# Patient Record
Sex: Female | Born: 1950
Health system: Southern US, Community
[De-identification: ages and names within clinical notes are randomized; demographics above are authoritative.]

## PROBLEM LIST (undated history)

## (undated) DIAGNOSIS — I10 Essential (primary) hypertension: Secondary | ICD-10-CM

## (undated) DIAGNOSIS — D649 Anemia, unspecified: Secondary | ICD-10-CM

## (undated) DIAGNOSIS — T8859XA Other complications of anesthesia, initial encounter: Secondary | ICD-10-CM

## (undated) DIAGNOSIS — E041 Nontoxic single thyroid nodule: Secondary | ICD-10-CM

## (undated) DIAGNOSIS — K219 Gastro-esophageal reflux disease without esophagitis: Secondary | ICD-10-CM

## (undated) DIAGNOSIS — R0981 Nasal congestion: Secondary | ICD-10-CM

## (undated) DIAGNOSIS — E785 Hyperlipidemia, unspecified: Secondary | ICD-10-CM

## (undated) DIAGNOSIS — R519 Headache, unspecified: Secondary | ICD-10-CM

## (undated) DIAGNOSIS — I209 Angina pectoris, unspecified: Secondary | ICD-10-CM

## (undated) DIAGNOSIS — T4145XA Adverse effect of unspecified anesthetic, initial encounter: Secondary | ICD-10-CM

## (undated) DIAGNOSIS — R51 Headache: Secondary | ICD-10-CM

## (undated) DIAGNOSIS — Z8719 Personal history of other diseases of the digestive system: Secondary | ICD-10-CM

## (undated) DIAGNOSIS — M1990 Unspecified osteoarthritis, unspecified site: Secondary | ICD-10-CM

## (undated) HISTORY — PX: FRACTURE SURGERY: SHX138

## (undated) HISTORY — PX: BACK SURGERY: SHX140

## (undated) HISTORY — PX: HERNIA REPAIR: SHX51

## (undated) HISTORY — PX: TONSILLECTOMY: SUR1361

---

## 1982-05-02 HISTORY — PX: PATELLA FRACTURE SURGERY: SHX735

## 1986-05-02 HISTORY — PX: TUBAL LIGATION: SHX77

## 1998-02-04 ENCOUNTER — Encounter: Payer: Self-pay | Admitting: Emergency Medicine

## 1998-02-04 ENCOUNTER — Emergency Department (HOSPITAL_COMMUNITY): Admission: EM | Admit: 1998-02-04 | Discharge: 1998-02-04 | Payer: Self-pay | Admitting: Emergency Medicine

## 1998-05-05 ENCOUNTER — Ambulatory Visit (HOSPITAL_COMMUNITY): Admission: RE | Admit: 1998-05-05 | Discharge: 1998-05-05 | Payer: Self-pay | Admitting: *Deleted

## 1998-05-05 ENCOUNTER — Encounter: Payer: Self-pay | Admitting: *Deleted

## 1998-06-11 ENCOUNTER — Encounter: Payer: Self-pay | Admitting: *Deleted

## 1998-06-11 ENCOUNTER — Ambulatory Visit (HOSPITAL_COMMUNITY): Admission: RE | Admit: 1998-06-11 | Discharge: 1998-06-11 | Payer: Self-pay | Admitting: *Deleted

## 1998-07-03 ENCOUNTER — Other Ambulatory Visit: Admission: RE | Admit: 1998-07-03 | Discharge: 1998-07-03 | Payer: Self-pay | Admitting: Obstetrics and Gynecology

## 1999-07-12 ENCOUNTER — Other Ambulatory Visit: Admission: RE | Admit: 1999-07-12 | Discharge: 1999-07-12 | Payer: Self-pay | Admitting: Obstetrics and Gynecology

## 2000-07-17 ENCOUNTER — Other Ambulatory Visit: Admission: RE | Admit: 2000-07-17 | Discharge: 2000-07-17 | Payer: Self-pay | Admitting: Obstetrics and Gynecology

## 2000-08-08 ENCOUNTER — Encounter (INDEPENDENT_AMBULATORY_CARE_PROVIDER_SITE_OTHER): Payer: Self-pay | Admitting: Specialist

## 2000-08-08 ENCOUNTER — Other Ambulatory Visit: Admission: RE | Admit: 2000-08-08 | Discharge: 2000-08-08 | Payer: Self-pay | Admitting: Obstetrics and Gynecology

## 2001-03-15 ENCOUNTER — Encounter: Payer: Self-pay | Admitting: Emergency Medicine

## 2001-03-15 ENCOUNTER — Emergency Department (HOSPITAL_COMMUNITY): Admission: EM | Admit: 2001-03-15 | Discharge: 2001-03-15 | Payer: Self-pay | Admitting: Emergency Medicine

## 2001-04-30 ENCOUNTER — Ambulatory Visit (HOSPITAL_COMMUNITY): Admission: RE | Admit: 2001-04-30 | Discharge: 2001-04-30 | Payer: Self-pay | Admitting: Otolaryngology

## 2001-04-30 ENCOUNTER — Encounter: Payer: Self-pay | Admitting: Otolaryngology

## 2001-05-14 ENCOUNTER — Encounter: Payer: Self-pay | Admitting: *Deleted

## 2001-05-14 ENCOUNTER — Ambulatory Visit (HOSPITAL_COMMUNITY): Admission: RE | Admit: 2001-05-14 | Discharge: 2001-05-14 | Payer: Self-pay | Admitting: *Deleted

## 2001-08-06 ENCOUNTER — Other Ambulatory Visit: Admission: RE | Admit: 2001-08-06 | Discharge: 2001-08-06 | Payer: Self-pay | Admitting: Obstetrics and Gynecology

## 2005-03-17 ENCOUNTER — Ambulatory Visit (HOSPITAL_COMMUNITY): Admission: RE | Admit: 2005-03-17 | Discharge: 2005-03-17 | Payer: Self-pay | Admitting: Obstetrics and Gynecology

## 2005-07-12 ENCOUNTER — Encounter (HOSPITAL_COMMUNITY): Admission: RE | Admit: 2005-07-12 | Discharge: 2005-08-11 | Payer: Self-pay | Admitting: Family Medicine

## 2005-08-12 ENCOUNTER — Encounter (HOSPITAL_COMMUNITY): Admission: RE | Admit: 2005-08-12 | Discharge: 2005-09-11 | Payer: Self-pay | Admitting: Family Medicine

## 2005-09-12 ENCOUNTER — Encounter (HOSPITAL_COMMUNITY): Admission: RE | Admit: 2005-09-12 | Discharge: 2005-10-12 | Payer: Self-pay | Admitting: Family Medicine

## 2007-01-27 ENCOUNTER — Emergency Department (HOSPITAL_COMMUNITY): Admission: EM | Admit: 2007-01-27 | Discharge: 2007-01-27 | Payer: Self-pay | Admitting: Emergency Medicine

## 2007-01-27 ENCOUNTER — Ambulatory Visit: Payer: Self-pay | Admitting: *Deleted

## 2008-04-08 ENCOUNTER — Encounter (HOSPITAL_COMMUNITY): Admission: RE | Admit: 2008-04-08 | Discharge: 2008-04-29 | Payer: Self-pay | Admitting: Endocrinology

## 2008-05-02 DIAGNOSIS — E041 Nontoxic single thyroid nodule: Secondary | ICD-10-CM

## 2008-05-02 HISTORY — DX: Nontoxic single thyroid nodule: E04.1

## 2008-09-18 ENCOUNTER — Encounter: Admission: RE | Admit: 2008-09-18 | Discharge: 2008-09-18 | Payer: Self-pay | Admitting: Allergy and Immunology

## 2008-12-26 ENCOUNTER — Emergency Department (HOSPITAL_COMMUNITY): Admission: EM | Admit: 2008-12-26 | Discharge: 2008-12-27 | Payer: Self-pay | Admitting: Emergency Medicine

## 2010-02-26 ENCOUNTER — Ambulatory Visit: Payer: Self-pay | Admitting: Vascular Surgery

## 2010-02-26 ENCOUNTER — Ambulatory Visit (HOSPITAL_COMMUNITY): Admission: RE | Admit: 2010-02-26 | Discharge: 2010-02-26 | Payer: Self-pay | Admitting: Orthopedic Surgery

## 2010-02-26 ENCOUNTER — Encounter (INDEPENDENT_AMBULATORY_CARE_PROVIDER_SITE_OTHER): Payer: Self-pay | Admitting: Orthopedic Surgery

## 2010-05-02 HISTORY — PX: POSTERIOR LUMBAR FUSION: SHX6036

## 2010-05-07 LAB — BASIC METABOLIC PANEL
BUN: 11 mg/dL (ref 6–23)
CO2: 33 mEq/L — ABNORMAL HIGH (ref 19–32)
Calcium: 9.7 mg/dL (ref 8.4–10.5)
Chloride: 101 mEq/L (ref 96–112)
Creatinine, Ser: 0.74 mg/dL (ref 0.4–1.2)
GFR calc Af Amer: >60 mL/min (ref >60)
GFR calc non Af Amer: >60 mL/min (ref >60)
Glucose, Bld: 109 mg/dL — ABNORMAL HIGH (ref 70–99)
Potassium: 3.5 mEq/L (ref 3.5–5.1)
Sodium: 140 mEq/L (ref 135–145)

## 2010-05-07 LAB — CBC
HCT: 39.8 % (ref 36.0–46.0)
Hemoglobin: 13.3 g/dL (ref 12.0–15.0)
MCH: 29 pg (ref 26.0–34.0)
MCHC: 33.4 g/dL (ref 30.0–36.0)
MCV: 86.9 fL (ref 78.0–100.0)
Platelets: 307 10*3/uL (ref 150–400)
RBC: 4.58 MIL/uL (ref 3.87–5.11)
RDW: 13.8 % (ref 11.5–15.5)
WBC: 6.8 10*3/uL (ref 4.0–10.5)

## 2010-05-07 LAB — TYPE AND SCREEN
ABO/RH(D): A POS
Antibody Screen: NEGATIVE

## 2010-05-07 LAB — ABO/RH: ABO/RH(D): A POS

## 2010-05-13 ENCOUNTER — Inpatient Hospital Stay (HOSPITAL_COMMUNITY)
Admission: RE | Admit: 2010-05-13 | Discharge: 2010-05-17 | Payer: Self-pay | Source: Home / Self Care | Attending: Neurosurgery | Admitting: Neurosurgery

## 2010-05-17 LAB — SURGICAL PCR SCREEN
MRSA, PCR: NEGATIVE
Staphylococcus aureus: NEGATIVE

## 2010-06-22 NOTE — Discharge Summary (Signed)
  NAMECARISHA, KANTOR NO.:  [id number]  MEDICAL RECORD NO.:  [id number]          PATIENT TYPE:  INP  LOCATION:  3017                         FACILITY:  [facility]  PHYSICIAN:  Danae Orleans. Venetia Maxon, M.D.  DATE OF BIRTH:  09-03-1950  DATE OF ADMISSION:  05/13/2010 DATE OF DISCHARGE:  05/17/2010                              DISCHARGE SUMMARY   REASON FOR ADMISSION:  L5-S1 spondylolisthesis right lower extremity with radiculopathy with morbid obesity.  HISTORY OF PRESENT ILLNESS:  Ms. Satcher is a 60 year old woman with L5- S1 spondylosis in the severe foraminal stenosis who would like to undergo surgery and she was admitted on same day of procedure basis for posterior decompression and fusion with minimal invasive surgery.  She did well and surgery was gradually mobilized, was doing well on June 17, 2010, and was discharged home in stable and satisfactory condition. She tolerated the hospitalization.  Surgery in hospitalization without difficulty.  She was sent home with hydrocodone, valsartan/hydrochlorothiazide 80/12.5 mg daily, ranitidine 300 mg daily, Nexium 40 mg twice daily. Instructed to follow up with me in my office in 3 weeks postoperatively.     Danae Orleans. Venetia Maxon, M.D.     JDS/MEDQ  D:  06/21/2010  T:  06/21/2010  Job:  [id number]  Electronically Signed by Maeola Harman M.D. on 06/22/2010 04:51:55 PM

## 2010-08-07 LAB — POCT I-STAT, CHEM 8
BUN: 24 mg/dL — ABNORMAL HIGH (ref 6–23)
Calcium, Ion: 1.12 mmol/L (ref 1.12–1.32)
Chloride: 103 mEq/L (ref 96–112)
Creatinine, Ser: 1.1 mg/dL (ref 0.4–1.2)
Glucose, Bld: 156 mg/dL — ABNORMAL HIGH (ref 70–99)
HCT: 40 % (ref 36.0–46.0)
Hemoglobin: 13.6 g/dL (ref 12.0–15.0)
Potassium: 3.2 mEq/L — ABNORMAL LOW (ref 3.5–5.1)
Sodium: 142 mEq/L (ref 135–145)
TCO2: 29 mmol/L (ref 0–100)

## 2014-06-20 DIAGNOSIS — M25561 Pain in right knee: Secondary | ICD-10-CM | POA: Diagnosis not present

## 2014-06-20 DIAGNOSIS — I10 Essential (primary) hypertension: Secondary | ICD-10-CM | POA: Diagnosis not present

## 2014-06-20 DIAGNOSIS — J309 Allergic rhinitis, unspecified: Secondary | ICD-10-CM | POA: Diagnosis not present

## 2014-06-20 DIAGNOSIS — Z79899 Other long term (current) drug therapy: Secondary | ICD-10-CM | POA: Diagnosis not present

## 2014-07-08 DIAGNOSIS — H4011X3 Primary open-angle glaucoma, severe stage: Secondary | ICD-10-CM | POA: Diagnosis not present

## 2014-07-18 DIAGNOSIS — I10 Essential (primary) hypertension: Secondary | ICD-10-CM | POA: Diagnosis not present

## 2014-07-18 DIAGNOSIS — K469 Unspecified abdominal hernia without obstruction or gangrene: Secondary | ICD-10-CM | POA: Diagnosis not present

## 2014-07-18 DIAGNOSIS — H6121 Impacted cerumen, right ear: Secondary | ICD-10-CM | POA: Diagnosis not present

## 2014-07-18 DIAGNOSIS — E559 Vitamin D deficiency, unspecified: Secondary | ICD-10-CM | POA: Diagnosis not present

## 2014-07-18 DIAGNOSIS — Z79899 Other long term (current) drug therapy: Secondary | ICD-10-CM | POA: Diagnosis not present

## 2014-07-29 DIAGNOSIS — K458 Other specified abdominal hernia without obstruction or gangrene: Secondary | ICD-10-CM | POA: Diagnosis not present

## 2014-07-29 DIAGNOSIS — K439 Ventral hernia without obstruction or gangrene: Secondary | ICD-10-CM | POA: Diagnosis not present

## 2014-09-05 DIAGNOSIS — E559 Vitamin D deficiency, unspecified: Secondary | ICD-10-CM | POA: Diagnosis not present

## 2014-09-05 DIAGNOSIS — J069 Acute upper respiratory infection, unspecified: Secondary | ICD-10-CM | POA: Diagnosis not present

## 2014-09-05 DIAGNOSIS — K469 Unspecified abdominal hernia without obstruction or gangrene: Secondary | ICD-10-CM | POA: Diagnosis not present

## 2014-09-05 DIAGNOSIS — J029 Acute pharyngitis, unspecified: Secondary | ICD-10-CM | POA: Diagnosis not present

## 2014-09-05 DIAGNOSIS — I10 Essential (primary) hypertension: Secondary | ICD-10-CM | POA: Diagnosis not present

## 2014-10-03 DIAGNOSIS — K439 Ventral hernia without obstruction or gangrene: Secondary | ICD-10-CM | POA: Diagnosis not present

## 2014-10-28 DIAGNOSIS — H4011X3 Primary open-angle glaucoma, severe stage: Secondary | ICD-10-CM | POA: Diagnosis not present

## 2014-11-11 DIAGNOSIS — J309 Allergic rhinitis, unspecified: Secondary | ICD-10-CM | POA: Diagnosis not present

## 2014-11-11 DIAGNOSIS — H6980 Other specified disorders of Eustachian tube, unspecified ear: Secondary | ICD-10-CM | POA: Diagnosis not present

## 2014-12-05 DIAGNOSIS — Z1289 Encounter for screening for malignant neoplasm of other sites: Secondary | ICD-10-CM | POA: Diagnosis not present

## 2014-12-05 DIAGNOSIS — Z1231 Encounter for screening mammogram for malignant neoplasm of breast: Secondary | ICD-10-CM | POA: Diagnosis not present

## 2014-12-30 DIAGNOSIS — J019 Acute sinusitis, unspecified: Secondary | ICD-10-CM | POA: Diagnosis not present

## 2014-12-31 DIAGNOSIS — D849 Immunodeficiency, unspecified: Secondary | ICD-10-CM | POA: Diagnosis not present

## 2015-01-29 DIAGNOSIS — H4011X1 Primary open-angle glaucoma, mild stage: Secondary | ICD-10-CM | POA: Diagnosis not present

## 2015-04-10 DIAGNOSIS — H6121 Impacted cerumen, right ear: Secondary | ICD-10-CM | POA: Diagnosis not present

## 2015-04-10 DIAGNOSIS — I10 Essential (primary) hypertension: Secondary | ICD-10-CM | POA: Diagnosis not present

## 2015-04-10 DIAGNOSIS — Z79899 Other long term (current) drug therapy: Secondary | ICD-10-CM | POA: Diagnosis not present

## 2015-04-10 DIAGNOSIS — E559 Vitamin D deficiency, unspecified: Secondary | ICD-10-CM | POA: Diagnosis not present

## 2015-04-10 DIAGNOSIS — E784 Other hyperlipidemia: Secondary | ICD-10-CM | POA: Diagnosis not present

## 2015-04-20 DIAGNOSIS — H401131 Primary open-angle glaucoma, bilateral, mild stage: Secondary | ICD-10-CM | POA: Diagnosis not present

## 2015-04-20 DIAGNOSIS — H2513 Age-related nuclear cataract, bilateral: Secondary | ICD-10-CM | POA: Diagnosis not present

## 2015-06-12 ENCOUNTER — Ambulatory Visit (INDEPENDENT_AMBULATORY_CARE_PROVIDER_SITE_OTHER): Payer: Medicare Other | Admitting: Allergy and Immunology

## 2015-06-12 VITALS — BP 128/72 | HR 84 | Temp 98.8°F | Resp 20

## 2015-06-12 DIAGNOSIS — R05 Cough: Secondary | ICD-10-CM | POA: Diagnosis not present

## 2015-06-12 DIAGNOSIS — Z8709 Personal history of other diseases of the respiratory system: Secondary | ICD-10-CM | POA: Insufficient documentation

## 2015-06-12 DIAGNOSIS — B9689 Other specified bacterial agents as the cause of diseases classified elsewhere: Secondary | ICD-10-CM

## 2015-06-12 DIAGNOSIS — J3089 Other allergic rhinitis: Secondary | ICD-10-CM | POA: Diagnosis not present

## 2015-06-12 DIAGNOSIS — R059 Cough, unspecified: Secondary | ICD-10-CM | POA: Insufficient documentation

## 2015-06-12 DIAGNOSIS — J019 Acute sinusitis, unspecified: Secondary | ICD-10-CM | POA: Insufficient documentation

## 2015-06-12 DIAGNOSIS — J0110 Acute frontal sinusitis, unspecified: Secondary | ICD-10-CM | POA: Diagnosis not present

## 2015-06-12 HISTORY — DX: Acute sinusitis, unspecified: B96.89

## 2015-06-12 MED ORDER — FLUTICASONE PROPIONATE 50 MCG/ACT NA SUSP: NASAL | Status: DC

## 2015-06-12 MED ORDER — PREDNISONE 1 MG PO TABS: 10.0000 mg | ORAL_TABLET | ORAL | Status: DC

## 2015-06-12 MED ORDER — METHYLPREDNISOLONE ACETATE 80 MG/ML IJ SUSP
80.0000 mg | Freq: Once | INTRAMUSCULAR | Status: AC
  Administered 2015-06-12: 80 mg via INTRAMUSCULAR

## 2015-06-12 NOTE — Assessment & Plan Note (Signed)
   Continue albuterol HFA, 1-2 inhalations every 4-6 hours as needed. 

## 2015-06-12 NOTE — Assessment & Plan Note (Signed)
   Depo-Medrol 80 mg was administered in the office.  Prednisone has been provided and is to be started tomorrow as follows: 20 mg daily x 4 days, 10 mg x1 day, then stop.  A prescription has been provided for fluticasone nasal spray, 2 sprays per nostril daily as needed. Proper nasal spray technique has been discussed and demonstrated.  Nasal saline lavage (NeilMed) as needed has been recommended along with instructions for proper administration.  Guaifenesin [phone] mg twice daily as needed with adequate hydration as discussed.   The patient has been asked to contact me if her symptoms persist, progress, or if she becomes febrile. Otherwise, she may return for follow up in 6 months.

## 2015-06-12 NOTE — Assessment & Plan Note (Signed)
Most likely secondary to upper airway cough syndrome plus/minus bronchial hyper-responsiveness triggered by sinusitis.  Treatment plan as outlined above.

## 2015-06-12 NOTE — Patient Instructions (Signed)
Acute sinusitis  Depo-Medrol 80 mg was administered in the office.  Prednisone has been provided and is to be started tomorrow as follows: 20 mg daily x 4 days, 10 mg x1 day, then stop.  A prescription has been provided for fluticasone nasal spray, 2 sprays per nostril daily as needed. Proper nasal spray technique has been discussed and demonstrated.  Nasal saline lavage (NeilMed) as needed has been recommended along with instructions for proper administration.  Guaifenesin [phone] mg twice daily as needed with adequate hydration as discussed.   The patient has been asked to contact me if her symptoms persist, progress, or if she becomes febrile. Otherwise, she may return for follow up in 6 months.  Cough Most likely secondary to upper airway cough syndrome plus/minus bronchial hyper-responsiveness triggered by sinusitis.  Treatment plan as outlined above.  History of asthma  Continue albuterol HFA, 1-2 inhalations every 4-6 hours as needed.   Return in about 6 months (around 12/10/2015), or if symptoms worsen or fail to improve.

## 2015-06-12 NOTE — Progress Notes (Signed)
Follow-up Note  RE: KALANDRA MASTERS MRN: [id number] DOB: [birth year] Date of Office Visit: 06/12/2015  Primary care provider: Gwynneth Aliment, MD Referring provider: No ref. provider found  History of present illness: HPI Comments: Rhonda [name] is a 65 y.o. female with a history of allergic rhinosinusitis, laryngopharyngeal reflux, and distant history of asthma presents today a for sick visit.  She was last seen in this office 12/30/2014.  She complains of sinus pressure over her forehead and behind her eyes, bilateral ear pressure, thick postnasal drainage, sore throat, and coughing.  She has expressed the symptoms over the past 3 days and states that the symptoms are progressing.  She denies fevers, chills, or discolored mucus production.   Assessment and plan: Acute sinusitis  Depo-Medrol 80 mg was administered in the office.  Prednisone has been provided and is to be started tomorrow as follows: 20 mg daily x 4 days, 10 mg x1 day, then stop.  A prescription has been provided for fluticasone nasal spray, 2 sprays per nostril daily as needed. Proper nasal spray technique has been discussed and demonstrated.  Nasal saline lavage (NeilMed) as needed has been recommended along with instructions for proper administration.  Guaifenesin [phone] mg twice daily as needed with adequate hydration as discussed.   The patient has been asked to contact me if her symptoms persist, progress, or if she becomes febrile. Otherwise, she may return for follow up in 6 months.  Cough Most likely secondary to upper airway cough syndrome plus/minus bronchial hyper-responsiveness triggered by sinusitis.  Treatment plan as outlined above.  History of asthma  Continue albuterol HFA, 1-2 inhalations every 4-6 hours as needed.    Meds ordered this encounter  Medications  . methylPREDNISolone acetate (DEPO-MEDROL) injection 80 mg    Sig:   . fluticasone (FLONASE) 50 MCG/ACT nasal spray   Sig: Use 2 sprays in each nostril once daily for stuffy nose or drainage.    Dispense:  17 g    Refill:  5  . predniSONE (DELTASONE) tablet 10 mg    Sig:     Diagnositics: Spirometry:  Normal with an FEV1 of 94% predicted.  Please see scanned spirometry results for details.    Physical examination: Blood pressure 128/72, pulse 84, temperature 98.8 F (37.1 C), temperature source Oral, resp. rate 20.  General: Alert, interactive, in no acute distress. HEENT: TMs pearly gray, turbinates edematous with thick discharge, post-pharynx erythematous. Neck: Supple without lymphadenopathy. Lungs: Clear to auscultation without wheezing, rhonchi or rales. CV: Normal S1, S2 without murmurs. Skin: Warm and dry, without lesions or rashes.  The following portions of the patient's history were reviewed and updated as appropriate: allergies, current medications, past family history, past medical history, past social history, past surgical history and problem list.    Medication List       This list is accurate as of: 06/12/15 12:41 PM.  Always use your most recent med list.               dorzolamide-timolol 22.3-6.8 MG/ML ophthalmic solution  Commonly known as:  COSOPT     ezetimibe 10 MG tablet  Commonly known as:  ZETIA  Take 10 mg by mouth daily.     fluticasone 50 MCG/ACT nasal spray  Commonly known as:  FLONASE  Use 2 sprays in each nostril once daily for stuffy nose or drainage.     levocetirizine 5 MG tablet  Commonly known as:  XYZAL     PROAIR  HFA IN  Inhale 2 puffs into the lungs every 4 (four) hours as needed.     ranitidine 300 MG capsule  Commonly known as:  ZANTAC  Take 300 mg by mouth every evening.     valsartan-hydrochlorothiazide 320-25 MG tablet  Commonly known as:  DIOVAN-HCT        Allergies  Allergen Reactions  . Codeine Diarrhea    Stomach cramps    Review of systems: Constitutional: Negative for fever, chills and weight loss.  HENT: Negative  for nosebleeds.   Positive for nasal congestion, sinus pressure/pain, postnasal drainage. Eyes: Negative for blurred vision.  Respiratory: Negative for hemoptysis.   Positive for coughing. Cardiovascular: Negative for chest pain.  Gastrointestinal: Negative for diarrhea and constipation.  Genitourinary: Negative for dysuria.  Musculoskeletal: Negative for myalgias and joint pain.  Neurological: Negative for dizziness.  Endo/Heme/Allergies: Does not bruise/bleed easily.   No past medical history on file.  No family history on file.  Social History   Social History  . Marital Status: Married    Spouse Name: N/A  . Number of Children: N/A  . Years of Education: N/A   Occupational History  . Not on file.   Social History Main Topics  . Smoking status: Not on file  . Smokeless tobacco: Not on file  . Alcohol Use: Not on file  . Drug Use: Not on file  . Sexual Activity: Not on file   Other Topics Concern  . Not on file   Social History Narrative  . No narrative on file    I appreciate the opportunity to take part in this Wonder's care. Please do not hesitate to contact me with questions.  Sincerely,   R. Jorene Guest, MD

## 2015-08-03 DIAGNOSIS — H401131 Primary open-angle glaucoma, bilateral, mild stage: Secondary | ICD-10-CM | POA: Diagnosis not present

## 2015-10-09 DIAGNOSIS — I10 Essential (primary) hypertension: Secondary | ICD-10-CM | POA: Diagnosis not present

## 2015-10-09 DIAGNOSIS — Z0000 Encounter for general adult medical examination without abnormal findings: Secondary | ICD-10-CM | POA: Diagnosis not present

## 2015-10-09 DIAGNOSIS — E559 Vitamin D deficiency, unspecified: Secondary | ICD-10-CM | POA: Diagnosis not present

## 2015-10-09 DIAGNOSIS — R7309 Other abnormal glucose: Secondary | ICD-10-CM | POA: Diagnosis not present

## 2015-10-09 DIAGNOSIS — J069 Acute upper respiratory infection, unspecified: Secondary | ICD-10-CM | POA: Diagnosis not present

## 2015-10-09 DIAGNOSIS — E782 Mixed hyperlipidemia: Secondary | ICD-10-CM | POA: Diagnosis not present

## 2015-10-09 DIAGNOSIS — H6121 Impacted cerumen, right ear: Secondary | ICD-10-CM | POA: Diagnosis not present

## 2015-10-30 DIAGNOSIS — Z23 Encounter for immunization: Secondary | ICD-10-CM | POA: Diagnosis not present

## 2015-11-09 DIAGNOSIS — H401131 Primary open-angle glaucoma, bilateral, mild stage: Secondary | ICD-10-CM | POA: Diagnosis not present

## 2016-02-22 DIAGNOSIS — K429 Umbilical hernia without obstruction or gangrene: Secondary | ICD-10-CM | POA: Diagnosis not present

## 2016-02-22 DIAGNOSIS — K219 Gastro-esophageal reflux disease without esophagitis: Secondary | ICD-10-CM | POA: Diagnosis not present

## 2016-02-22 DIAGNOSIS — Z86010 Personal history of colonic polyps: Secondary | ICD-10-CM | POA: Diagnosis not present

## 2016-02-26 DIAGNOSIS — Z23 Encounter for immunization: Secondary | ICD-10-CM | POA: Diagnosis not present

## 2016-03-23 ENCOUNTER — Encounter (INDEPENDENT_AMBULATORY_CARE_PROVIDER_SITE_OTHER): Payer: Self-pay

## 2016-03-23 ENCOUNTER — Ambulatory Visit (INDEPENDENT_AMBULATORY_CARE_PROVIDER_SITE_OTHER): Payer: Medicare Other | Admitting: Allergy and Immunology

## 2016-03-23 VITALS — BP 140/70 | HR 76 | Resp 16

## 2016-03-23 DIAGNOSIS — K219 Gastro-esophageal reflux disease without esophagitis: Secondary | ICD-10-CM

## 2016-03-23 DIAGNOSIS — R42 Dizziness and giddiness: Secondary | ICD-10-CM

## 2016-03-23 DIAGNOSIS — J3089 Other allergic rhinitis: Secondary | ICD-10-CM | POA: Diagnosis not present

## 2016-03-23 DIAGNOSIS — H9313 Tinnitus, bilateral: Secondary | ICD-10-CM

## 2016-03-23 DIAGNOSIS — H6983 Other specified disorders of Eustachian tube, bilateral: Secondary | ICD-10-CM

## 2016-03-23 NOTE — Patient Instructions (Addendum)
  1. Prednisone 10 mg tablet one tablet one time per day for the next 5 days  2. Continue Flonase 2 sprays each nostril once a day  3. Can use ibuprofen if needed  4. Can use antihistamine if needed  5. Continue Nexium 40mg  and ranitidine 300 mg once a day  6. Evaluation with ENT for ETD  7. Obtain flu vaccine  8. Further evaluation treatment?  9. Return to clinic in 1 year or earlier if problem

## 2016-03-23 NOTE — Progress Notes (Signed)
Follow-up Note  Referring Provider: Dorothyann Peng, MD Primary Provider: Gwynneth Aliment, MD Date of Office Visit: 03/23/2016  Subjective:   Rhonda [name] (DOB: [birth year]) is a 65 y.o. female [facility] in re-evaluation of the following:  HPI: Rhonda [name] returns to this clinic in evaluation of her allergic rhinoconjunctivitis and LPR and a very distant history of asthma. I've not seen her in his clinic in over a year.  She was doing quite well regarding her respiratory tract and had no need to use a systemic steroid or an antibiotic since I last her in this clinic to treat her respiratory tract issue. Her asthma has basally melted away and she has not used a short acting bronchodilator in years. However, over the course of the past 5-7 days she's developed a bilateral ringing in her years and she has a little bit of a sore throat and she feels a little bit unsteady without any frank vertigo and her hearing is diminished. In fact, her hearing is somewhat down over the course of the past 2 months.    Medication List      dorzolamide-timolol 22.3-6.8 MG/ML ophthalmic solution Commonly known as:  COSOPT   esomeprazole 40 MG capsule Commonly known as:  NEXIUM Take 40 mg by mouth daily at 12 noon.   ezetimibe 10 MG tablet Commonly known as:  ZETIA Take 10 mg by mouth daily.   fluticasone 50 MCG/ACT nasal spray Commonly known as:  FLONASE Use 2 sprays in each nostril once daily for stuffy nose or drainage.   levocetirizine 5 MG tablet Commonly known as:  XYZAL   PROAIR HFA IN Inhale 2 puffs into the lungs every 4 (four) hours as needed.   ranitidine 300 MG capsule Commonly known as:  ZANTAC Take 300 mg by mouth every evening.   sodium chloride 0.65 % Soln nasal spray Commonly known as:  OCEAN Place 1 spray into both nostrils as needed for congestion.   valsartan-hydrochlorothiazide 320-25 MG tablet Commonly known as:   DIOVAN-HCT Take 1 tablet by mouth daily.   Vitamin D 2000 units Caps Take by mouth.       No past medical history on file.  No past surgical history on file.  Allergies  Allergen Reactions  . Codeine Diarrhea    Stomach cramps     Review of systems negative except as noted in HPI / PMHx or noted below:  Review of Systems  Constitutional: Negative.   HENT: Negative.   Eyes: Negative.   Respiratory: Negative.   Cardiovascular: Negative.   Gastrointestinal: Negative.   Genitourinary: Negative.   Musculoskeletal: Negative.   Skin: Negative.   Neurological: Negative.   Endo/Heme/Allergies: Negative.   Psychiatric/Behavioral: Negative.      Objective:   Vitals:   03/23/16 1013  BP: 140/70  Pulse: 76  Resp: 16          Physical Exam  Constitutional: She is well-developed, well-nourished, and in no distress.  HENT:  Head: Normocephalic.  Right Ear: Tympanic membrane, external ear and ear canal normal. No foreign bodies (wax).  Left Ear: External ear and ear canal normal. A middle ear effusion (dull light reflex with fluid) is present.  Nose: Nose normal. No mucosal edema or rhinorrhea.  Mouth/Throat: Uvula is midline, oropharynx is clear and moist and mucous membranes are normal. No oropharyngeal exudate.  Eyes: Conjunctivae are normal.  Neck: Trachea normal. No tracheal tenderness present. No tracheal  deviation present. No thyromegaly present.  Cardiovascular: Normal rate, regular rhythm, S1 normal, S2 normal and normal heart sounds.   No murmur heard. Pulmonary/Chest: Breath sounds normal. No stridor. No respiratory distress. She has no wheezes. She has no rales.  Musculoskeletal: She exhibits no edema.  Lymphadenopathy:       Head (right side): No tonsillar adenopathy present.       Head (left side): No tonsillar adenopathy present.    She has no cervical adenopathy.  Neurological: She is alert. Gait normal.  Skin: No rash noted. She is not diaphoretic.  No erythema. Nails show no clubbing.  Psychiatric: Mood and affect normal.    Diagnostics: None   Assessment and Plan:   1. ETD (Eustachian tube dysfunction), bilateral   2. Tinnitus of both ears   3. Vertigo   4. Other allergic rhinitis   5. LPRD (laryngopharyngeal reflux disease)     1. Prednisone 10 mg tablet one tablet one time per day for the next 5 days  2. Continue Flonase 2 sprays each nostril once a day  3. Can use ibuprofen if needed  4. Can use antihistamine if needed  5. Continue Nexium 40mg  and ranitidine 300 mg once a day  6. Evaluation with ENT for ETD  7. Obtain flu vaccine  8. Further evaluation treatment?  9. Return to clinic in 1 year or earlier if problem  Although Rhonda [name] appears to have pretty good control of her atopic respiratory disease there is something wrong with her eustachian tube and she's having vertigo and tinnitus and some hearing loss. Certainly this could be Mnire's disease and I would like for her to be evaluated by an ENT physician for evaluation of this issue but this could also be secondary to a acute respiratory tract infection most likely a viral etiology. I've given her some anti-inflammatory agents as noted above to help with this issue. Certainly if she progresses she'll require further evaluation and treatment. She'll also continue to utilize therapy directed against her reflux which is under excellent control. I'll see her back in this clinic in 1 year or earlier if there is a problem.   Rhonda Schimke, MD [facility]

## 2016-03-31 DIAGNOSIS — Z86010 Personal history of colonic polyps: Secondary | ICD-10-CM | POA: Diagnosis not present

## 2016-03-31 DIAGNOSIS — K5730 Diverticulosis of large intestine without perforation or abscess without bleeding: Secondary | ICD-10-CM | POA: Diagnosis not present

## 2016-03-31 HISTORY — PX: COLONOSCOPY: SHX174

## 2016-04-04 ENCOUNTER — Telehealth: Payer: Self-pay | Admitting: Allergy and Immunology

## 2016-04-04 NOTE — Telephone Encounter (Signed)
Pt was calling  About a appointment to the ENT, has not heard anything from us.[phone].

## 2016-04-04 NOTE — Telephone Encounter (Signed)
L/M for patient to call office.  Appt made with Dr Jearld FentonByers on Dec 14 at 1:00 [facility] 12:[facility] [address]

## 2016-04-04 NOTE — Telephone Encounter (Signed)
Patient advised of appt

## 2016-04-05 ENCOUNTER — Emergency Department (HOSPITAL_COMMUNITY)
Admission: EM | Admit: 2016-04-05 | Discharge: 2016-04-05 | Disposition: A | Discharge status: Home / Self Care | Payer: Medicare Other | Attending: Emergency Medicine | Admitting: Emergency Medicine

## 2016-04-05 ENCOUNTER — Encounter (HOSPITAL_COMMUNITY): Payer: Self-pay | Admitting: Emergency Medicine

## 2016-04-05 DIAGNOSIS — H5789 Other specified disorders of eye and adnexa: Secondary | ICD-10-CM

## 2016-04-05 DIAGNOSIS — H05221 Edema of right orbit: Secondary | ICD-10-CM | POA: Diagnosis not present

## 2016-04-05 DIAGNOSIS — Y939 Activity, unspecified: Secondary | ICD-10-CM | POA: Insufficient documentation

## 2016-04-05 DIAGNOSIS — S50862A Insect bite (nonvenomous) of left forearm, initial encounter: Secondary | ICD-10-CM | POA: Diagnosis not present

## 2016-04-05 DIAGNOSIS — S00261A Insect bite (nonvenomous) of right eyelid and periocular area, initial encounter: Secondary | ICD-10-CM | POA: Diagnosis not present

## 2016-04-05 DIAGNOSIS — W57XXXA Bitten or stung by nonvenomous insect and other nonvenomous arthropods, initial encounter: Secondary | ICD-10-CM | POA: Insufficient documentation

## 2016-04-05 DIAGNOSIS — I10 Essential (primary) hypertension: Secondary | ICD-10-CM | POA: Diagnosis not present

## 2016-04-05 DIAGNOSIS — Y929 Unspecified place or not applicable: Secondary | ICD-10-CM | POA: Diagnosis not present

## 2016-04-05 DIAGNOSIS — Y999 Unspecified external cause status: Secondary | ICD-10-CM | POA: Diagnosis not present

## 2016-04-05 DIAGNOSIS — S50861A Insect bite (nonvenomous) of right forearm, initial encounter: Secondary | ICD-10-CM | POA: Diagnosis not present

## 2016-04-05 HISTORY — DX: Essential (primary) hypertension: I10

## 2016-04-05 HISTORY — DX: Gastro-esophageal reflux disease without esophagitis: K21.9

## 2016-04-05 HISTORY — DX: Hyperlipidemia, unspecified: E78.5

## 2016-04-05 MED ORDER — CEPHALEXIN 500 MG PO CAPS: 500.0000 mg | ORAL_CAPSULE | Freq: Four times a day (QID) | ORAL | 0 refills | Status: DC

## 2016-04-05 MED ORDER — DIPHENHYDRAMINE HCL 25 MG PO CAPS
25.0000 mg | ORAL_CAPSULE | Freq: Once | ORAL | Status: AC
  Administered 2016-04-05: 25 mg via ORAL
  Filled 2016-04-05: qty 1

## 2016-04-05 NOTE — ED Triage Notes (Signed)
Name called to lobby. Patient in restroom at this time.

## 2016-04-05 NOTE — ED Provider Notes (Signed)
[facility]-EMERGENCY DEPT Provider Note   CSN: [id number] Arrival date & time: 04/05/16  [id number]  By signing my name below, I, Rhonda [name], attest that this documentation has been prepared under the direction and in the presence of Rhonda FowlerKayla Pat Sires, PA-C. Electronically Signed: Javier Dockerobert Ryan [name], ER Scribe. 12/12/2015. 10:21 AM.   History   Chief Complaint Chief Complaint  Patient presents with  . Facial Swelling    right   HPI  HPI Comments: Rhonda [name] is a 65 y.o. female who presents to the Emergency Department complaining of left eyelid swelling with associated eyelid itching that began last night and worsened over the course of the evening. She endorses associated right eye drainage this morning. She denies new medications, soaps, lotions, detergents. She put anti itch cream on her eyelid last night due to itching. She is also complaining of scattered insect bites to her forearms as well. She denies fever, HA, eye pain, eye pain with movement, visual disturbances, numbness, or weakness. She took two benadryl last night.  Past Medical History:  Diagnosis Date  . GERD (gastroesophageal reflux disease)   . Hyperlipemia   . Hypertension     Patient Active Problem List   Diagnosis Date Noted  . Acute sinusitis 06/12/2015  . Cough 06/12/2015  . History of asthma 06/12/2015    Past Surgical History:  Procedure Laterality Date  . BACK SURGERY  2012  . CESAREAN SECTION     x2  . TONSILLECTOMY      OB History    No data available       Home Medications    Prior to Admission medications   Medication Sig Start Date End Date Taking? Authorizing Provider  Albuterol Sulfate (PROAIR HFA IN) Inhale 2 puffs into the lungs every 4 (four) hours as needed.    Historical Provider, MD  Cholecalciferol (VITAMIN D) 2000 units CAPS Take by mouth.    Historical Provider, MD  dorzolamide-timolol (COSOPT) 22.3-6.8 MG/ML ophthalmic solution  04/23/15   Historical Provider, MD  esomeprazole  (NEXIUM) 40 MG capsule Take 40 mg by mouth daily at 12 noon.    Historical Provider, MD  ezetimibe (ZETIA) 10 MG tablet Take 10 mg by mouth daily.    Historical Provider, MD  fluticasone (FLONASE) 50 MCG/ACT nasal spray Use 2 sprays in each nostril once daily for stuffy nose or drainage. Patient not taking: Reported on 03/23/2016 06/12/15   Cristal Fordalph Carter Bobbitt, MD  levocetirizine Elita Boone(XYZAL) 5 MG tablet  05/15/15   Historical Provider, MD  ranitidine (ZANTAC) 300 MG capsule Take 300 mg by mouth every evening.    Historical Provider, MD  sodium chloride (OCEAN) 0.65 % SOLN nasal spray Place 1 spray into both nostrils as needed for congestion.    Historical Provider, MD  valsartan-hydrochlorothiazide (DIOVAN-HCT) 320-25 MG tablet Take 1 tablet by mouth daily.  05/19/15   Historical Provider, MD    Family History No family history on file.  Social History Social History  Substance Use Topics  . Smoking status: Never Smoker  . Smokeless tobacco: Never Used  . Alcohol use No     Allergies   Codeine   Review of Systems Review of Systems  Constitutional: Negative for chills and fever.  Eyes: Positive for photophobia and itching. Negative for pain, discharge, redness and visual disturbance.  Neurological: Negative for dizziness, speech difficulty, weakness, light-headedness, numbness and headaches.  All other systems reviewed and are negative.    Physical Exam Updated Vital Signs BP 154/57 (  BP Location: Left Arm)   Pulse 72   Temp 97.9 F (36.6 C) (Oral)   Resp 18   Ht 5\' 3"  (1.6 m)   Wt 122.5 kg   SpO2 100%   BMI 47.83 kg/m   Physical Exam  Constitutional: She is oriented to person, place, and time. She appears well-developed and well-nourished. No distress.  HENT:  Head: Normocephalic and atraumatic.  Right upper eyelid swelling with minimal warmth and erythema.  No induration. Likely excoriations to superior eyebrow.   Eyes: Conjunctivae and EOM are normal. Pupils are equal,  round, and reactive to light. Right eye exhibits no discharge. Left eye exhibits no discharge.  Normal EOMs without pain.   Neck: Neck supple.  Pulmonary/Chest: Effort normal. No respiratory distress.  Musculoskeletal: Normal range of motion.  Neurological: She is alert and oriented to person, place, and time. Coordination normal.  Skin: Skin is warm and dry. She is not diaphoretic.  Scattered insect bites over bilateral forearms without signs of infection.   Psychiatric: She has a normal mood and affect. Her behavior is normal.  Nursing note and vitals reviewed.    ED Treatments / Results  Labs (all labs ordered are listed, but only abnormal results are displayed) Labs Reviewed - No data to display  EKG  EKG Interpretation None       Radiology No results found.  Procedures Procedures (including critical care time)  Medications Ordered in ED Medications  diphenhydrAMINE (BENADRYL) capsule 25 mg (25 mg Oral Given 04/05/16 1040)     Initial Impression / Assessment and Plan / ED Course  I have reviewed the triage vital signs and the nursing notes.  Pertinent labs & imaging results that were available during my care of the patient were reviewed by me and considered in my medical decision making (see chart for details).  Clinical Course    Patient presents with 1 day of right supraorbital swelling. She has multiple insect bites over her bilateral forearms and suspects she was bitten on the face last night. She has no headache, eye pain, eye pain with movement, fever, chills, visual disturbances or neurologic symptoms. Therefore I have low suspicion for CVST or orbital/preseptal cellulitis. There is a small area superior to her right eyebrow that appears to be excoriated, but could be signs of early cellulitis. Therefore she'll be discharged home with Keflex. Recommend continued use of Benadryl for pruritus. Strict return precautions including headache, increased swelling, fever,  eye pain, or pain with eye movement. Close follow-up with PCP. Return precautions discussed. Stable for discharge.  Case has been discussed with and seen by Dr. Eudelia Bunchardama who agrees with the above plan for discharge.   Final Clinical Impressions(s) / ED Diagnoses   Final diagnoses:  Periorbital swelling  Insect bite, initial encounter    New Prescriptions New Prescriptions   No medications on file    I personally performed the services described in this documentation, which was scribed in my presence. The recorded information has been reviewed and is accurate.        Rhonda FowlerKayla Breunna Nordmann, PA-C 04/05/16 1108    Nira ConnPedro Eduardo Cardama, MD 04/07/16 1155

## 2016-04-05 NOTE — Discharge Instructions (Signed)
Start taking Keflex 4 times daily to treat for possible infection around her right eye. Continue taking Benadryl every 6 hours as needed for itching. Follow-up with your primary care physician in one to 2 days. Return to the emergency department for fever, chills, eye pain, pain with eye movement, increased swelling, headache, or any new or concerning symptoms.

## 2016-04-05 NOTE — ED Triage Notes (Signed)
Patient reports right eye swelling starting last night. Denies pain; reports itching.

## 2016-04-15 DIAGNOSIS — J329 Chronic sinusitis, unspecified: Secondary | ICD-10-CM | POA: Diagnosis not present

## 2016-04-15 DIAGNOSIS — H903 Sensorineural hearing loss, bilateral: Secondary | ICD-10-CM | POA: Diagnosis not present

## 2016-04-15 DIAGNOSIS — H9313 Tinnitus, bilateral: Secondary | ICD-10-CM | POA: Diagnosis not present

## 2016-04-15 DIAGNOSIS — R42 Dizziness and giddiness: Secondary | ICD-10-CM | POA: Diagnosis not present

## 2016-04-15 DIAGNOSIS — J320 Chronic maxillary sinusitis: Secondary | ICD-10-CM | POA: Diagnosis not present

## 2016-04-17 DIAGNOSIS — H9313 Tinnitus, bilateral: Secondary | ICD-10-CM | POA: Insufficient documentation

## 2016-04-20 DIAGNOSIS — R21 Rash and other nonspecific skin eruption: Secondary | ICD-10-CM | POA: Diagnosis not present

## 2016-04-20 DIAGNOSIS — H6121 Impacted cerumen, right ear: Secondary | ICD-10-CM | POA: Diagnosis not present

## 2016-04-20 DIAGNOSIS — L509 Urticaria, unspecified: Secondary | ICD-10-CM | POA: Diagnosis not present

## 2016-05-02 DIAGNOSIS — R011 Cardiac murmur, unspecified: Secondary | ICD-10-CM

## 2016-05-02 DIAGNOSIS — J45909 Unspecified asthma, uncomplicated: Secondary | ICD-10-CM

## 2016-05-02 HISTORY — DX: Unspecified asthma, uncomplicated: J45.909

## 2016-05-02 HISTORY — DX: Cardiac murmur, unspecified: R01.1

## 2016-05-09 ENCOUNTER — Other Ambulatory Visit: Payer: Self-pay | Admitting: General Surgery

## 2016-05-09 ENCOUNTER — Ambulatory Visit: Payer: Self-pay | Admitting: General Surgery

## 2016-05-09 DIAGNOSIS — K439 Ventral hernia without obstruction or gangrene: Secondary | ICD-10-CM | POA: Diagnosis not present

## 2016-05-09 DIAGNOSIS — H401131 Primary open-angle glaucoma, bilateral, mild stage: Secondary | ICD-10-CM | POA: Diagnosis not present

## 2016-05-09 DIAGNOSIS — K429 Umbilical hernia without obstruction or gangrene: Secondary | ICD-10-CM

## 2016-05-09 NOTE — H&P (Signed)
History of Present Illness Rhonda [name](Shafiq Larch MD; 05/09/2016 10:41 AM) Patient words: follow up.  The patient is a 66 year old female who presents with an abdominal wall hernia. patient is a 66 year old female who is a long-term follow-up for evaluation of a ventral hernia. Patient states that she doesn't hernias gotten larger. Patient recently had a colonoscopy was unable to have a colonoscopy completed secondary to her hernia. Patient has lost approximately 10 pounds since her last visit approximately 6 months ago.  Patient's had no signs or symptoms of incarceration or strangulation.   Allergies (Ammie Eversole, LPN; 2/1/30861/11/2016 57:8410:06 AM) Aspirin *ANALGESICS - NonNarcotic*  Codeine Phosphate *ANALGESICS - OPIOID*   Medication History (Ammie Eversole, LPN; 6/9/62951/11/2016 28:4110:07 AM) Ranitidine HCl (300MG  Tablet, Oral) Active. Levocetirizine Dihydrochloride (5MG  Tablet, Oral) Active. Vitamin D2 (2000UNIT Tablet, Oral) Active. Nexium 24HR (20MG  Capsule DR, Oral) Active. Dorzolamide HCl-Timolol Mal (22.3-6.8MG /ML Solution, Ophthalmic) Active. Valsartan-Hydrochlorothiazide (320-25MG  Tablet, Oral) Active. Ezetimibe (10MG  Tablet, Oral) Active. Medications Reconciled  Vitals (Ammie Eversole LPN; 3/2/44011/11/2016 02:7210:05 AM) 05/09/2016 10:05 AM Weight: 267 lb Height: 63.5in Body Surface Area: 2.2 m Body Mass Index: 46.55 kg/m  Temp.: 98.19F(Oral)  Pulse: 98 (Regular)  BP: 124/70 (Sitting, Left Arm, Standard)       Physical Exam Rhonda [name](Dorothye Berni MD; 05/09/2016 10:42 AM) General Mental Status-Alert. General Appearance-Consistent with stated age. Hydration-Well hydrated. Voice-Normal.  Head and Neck Head-normocephalic, atraumatic with no lesions or palpable masses. Trachea-midline. Thyroid Gland Characteristics - normal size and consistency.  Chest and Lung Exam Chest and lung exam reveals -quiet, even and easy respiratory effort with no use of accessory muscles  and on auscultation, normal breath sounds, no adventitious sounds and normal vocal resonance. Inspection Chest Wall - Normal. Back - normal.  Cardiovascular Cardiovascular examination reveals -normal heart sounds, regular rate and rhythm with no murmurs and normal pedal pulses bilaterally.  Abdomen Inspection Skin - Scar - no surgical scars. Hernias - Ventral - Reducible(Periumbilical ventral hernia. Rectus diastases.). Palpation/Percussion Normal exam - Soft, Non Tender, No Rebound tenderness, No Rigidity (guarding) and No hepatosplenomegaly. Auscultation Normal exam - Bowel sounds normal.    Assessment & Plan Rhonda [name](Socorro Ebron MD; 05/09/2016 10:43 AM) VENTRAL HERNIA WITHOUT OBSTRUCTION OR GANGRENE (K43.9) Impression: 66 year old female with a large ventral hernia. Patient has continue with weight loss. She's on the correct trend for weight loss. I do believe it is beneficial at this point to proceed with surgery.  1. The patient would like to proceed to the operating for a open ventral hernia repair with mesh,TAR approach. 2. All risks and benefits were discussed with the patient to generally include, but not limited to: infection, bleeding, damage to surrounding structures, acute and chronic nerve pain, and recurrence. Alternatives were offered and described. All questions were answered and the patient voiced understanding of the procedure and wishes to proceed at this point with hernia repair.

## 2016-05-13 ENCOUNTER — Ambulatory Visit
Admission: RE | Admit: 2016-05-13 | Discharge: 2016-05-13 | Disposition: A | Discharge status: Home / Self Care | Payer: Medicare Other | Source: Ambulatory Visit | Attending: General Surgery | Admitting: General Surgery

## 2016-05-13 DIAGNOSIS — K429 Umbilical hernia without obstruction or gangrene: Secondary | ICD-10-CM | POA: Diagnosis not present

## 2016-06-06 ENCOUNTER — Telehealth: Payer: Self-pay | Admitting: Allergy and Immunology

## 2016-06-06 NOTE — Telephone Encounter (Signed)
Scheduled appointment for tomorrow to see Dr. Lucie LeatherKozlow about allergic reaction.

## 2016-06-06 NOTE — Telephone Encounter (Signed)
Patient said she has had some type of allergic reaction. She has a swollen eye and bumps on the back of her neck. Last saw Dr. Lucie LeatherKozlow 03-23-16.

## 2016-06-07 ENCOUNTER — Ambulatory Visit (INDEPENDENT_AMBULATORY_CARE_PROVIDER_SITE_OTHER): Payer: Medicare Other | Admitting: Allergy and Immunology

## 2016-06-07 ENCOUNTER — Encounter (HOSPITAL_COMMUNITY)
Admission: RE | Admit: 2016-06-07 | Discharge: 2016-06-07 | Disposition: A | Discharge status: Home / Self Care | Payer: Medicare Other | Source: Ambulatory Visit | Attending: General Surgery | Admitting: General Surgery

## 2016-06-07 ENCOUNTER — Encounter (HOSPITAL_COMMUNITY): Payer: Self-pay

## 2016-06-07 ENCOUNTER — Encounter: Payer: Self-pay | Admitting: Allergy and Immunology

## 2016-06-07 VITALS — BP 148/72 | HR 84 | Resp 18

## 2016-06-07 DIAGNOSIS — K439 Ventral hernia without obstruction or gangrene: Secondary | ICD-10-CM | POA: Insufficient documentation

## 2016-06-07 DIAGNOSIS — J3089 Other allergic rhinitis: Secondary | ICD-10-CM

## 2016-06-07 DIAGNOSIS — K219 Gastro-esophageal reflux disease without esophagitis: Secondary | ICD-10-CM

## 2016-06-07 DIAGNOSIS — L501 Idiopathic urticaria: Secondary | ICD-10-CM

## 2016-06-07 DIAGNOSIS — Z01812 Encounter for preprocedural laboratory examination: Secondary | ICD-10-CM | POA: Insufficient documentation

## 2016-06-07 DIAGNOSIS — Z01810 Encounter for preprocedural cardiovascular examination: Secondary | ICD-10-CM | POA: Diagnosis not present

## 2016-06-07 HISTORY — DX: Adverse effect of unspecified anesthetic, initial encounter: T41.45XA

## 2016-06-07 HISTORY — DX: Nasal congestion: R09.81

## 2016-06-07 HISTORY — DX: Unspecified osteoarthritis, unspecified site: M19.90

## 2016-06-07 HISTORY — DX: Other complications of anesthesia, initial encounter: T88.59XA

## 2016-06-07 LAB — BASIC METABOLIC PANEL
Anion gap: 7 (ref 5–15)
BUN: 21 mg/dL — AB (ref 6–20)
CHLORIDE: 103 mmol/L (ref 101–111)
CO2: 31 mmol/L (ref 22–32)
CREATININE: 0.69 mg/dL (ref 0.44–1.00)
Calcium: 9.8 mg/dL (ref 8.9–10.3)
GFR calc Af Amer: >60 mL/min (ref >60)
GFR calc non Af Amer: >60 mL/min (ref >60)
GLUCOSE: 115 mg/dL — AB (ref 65–99)
Potassium: 3.6 mmol/L (ref 3.5–5.1)
SODIUM: 141 mmol/L (ref 135–145)

## 2016-06-07 LAB — CBC
HCT: 38.3 % (ref 36.0–46.0)
Hemoglobin: 12.4 g/dL (ref 12.0–15.0)
MCH: 28.1 pg (ref 26.0–34.0)
MCHC: 32.4 g/dL (ref 30.0–36.0)
MCV: 86.7 fL (ref 78.0–100.0)
PLATELETS: 298 10*3/uL (ref 150–400)
RBC: 4.42 MIL/uL (ref 3.87–5.11)
RDW: 14.8 % (ref 11.5–15.5)
WBC: 6.6 10*3/uL (ref 4.0–10.5)

## 2016-06-07 MED ORDER — LEVOCETIRIZINE DIHYDROCHLORIDE 5 MG PO TABS: ORAL_TABLET | ORAL | 5 refills | Status: DC

## 2016-06-07 MED ORDER — MONTELUKAST SODIUM 10 MG PO TABS: 10.0000 mg | ORAL_TABLET | Freq: Every day | ORAL | 5 refills | Status: DC

## 2016-06-07 NOTE — Progress Notes (Signed)
Follow-up Note  Referring Provider: Dorothyann Peng, MD Primary Provider: Gwynneth Aliment, MD Date of Office Visit: 06/07/2016  Subjective:   Rhonda [name] (DOB: [birth year]) is a 66 y.o. female [facility] in re-evaluation of the following:  HPI: Rhonda [name] presents to this clinic in evaluation of recurrent urticarial reactions with a predilection for eye swelling. I last saw Rhonda [name] in this clinic on 23 March 2016 at which point in time she had allergic rhinoconjunctivitis and LPR and a very distant history of asthma and what appeared to be some ear issues.  Rhonda [name] apparently has been developing recurrent urticarial reactions over the course of the past 2 months with unknown etiologic factor. She'll develop these super large red areas that are intensely itchy involving her hands and arms and posterior neck and predilection for involving her eyes. Her eyes will swell shut. It sounds as though she has required prednisone administered in the emergency room setting on April 05 2016 and then administered by her primary care doctor on May 11 2016. She does respond to therapy and within several days most of her reaction appears to resolve. She does take Benadryl. There are no associated systemic or constitutional symptoms. There is no obvious provoking factor giving rise to this issue.  Her airway issue and her reflux-induced respiratory disease are under excellent control currently using her medical therapy which includes some occasional nasal steroid and consistent use of Nexium and ranitidine. Her evaluation with an ENT doctor for her ETD did not identify any significant abnormality and there is no plan at this point in time by her ENT doctor to address this issue any further.  Allergies as of 06/07/2016      Reactions   Aspirin-acetaminophen-caffeine Nausea And Vomiting   Codeine Diarrhea   Stomach cramps      Medication List        acetaminophen 650 MG CR tablet Commonly known as:  TYLENOL Take 1,300 mg by mouth every 8 (eight) hours as needed for pain.   albuterol 108 (90 Base) MCG/ACT inhaler Commonly known as:  PROVENTIL HFA;VENTOLIN HFA Inhale 2 puffs into the lungs every 6 (six) hours as needed for wheezing or shortness of breath.   dorzolamide-timolol 22.3-6.8 MG/ML ophthalmic solution Commonly known as:  COSOPT Place 1 drop into both eyes 2 (two) times daily.   ezetimibe 10 MG tablet Commonly known as:  ZETIA Take 10 mg by mouth every evening.   fluticasone 50 MCG/ACT nasal spray Commonly known as:  FLONASE Use 2 sprays in each nostril once daily for stuffy nose or drainage.   levocetirizine 5 MG tablet Commonly known as:  XYZAL Take 5 mg by mouth every evening.   multivitamin with minerals Tabs tablet Take 1 tablet by mouth daily.   ranitidine 300 MG capsule Commonly known as:  ZANTAC Take 300 mg by mouth every evening.   valsartan-hydrochlorothiazide 320-25 MG tablet Commonly known as:  DIOVAN-HCT Take 1 tablet by mouth daily.   VITAMIN D3 PO Take 500 Units by mouth daily.       Past Medical History:  Diagnosis Date  . Arthritis   . Complication of anesthesia    woke up during colonoscopy 03-31-16 Dr. Laure Kidney  . GERD (gastroesophageal reflux disease)   . Hyperlipemia   . Hypertension   . Sinus congestion     Past Surgical History:  Procedure Laterality Date  . BACK SURGERY  2012  . CESAREAN  SECTION     x2  . COLONOSCOPY    . TONSILLECTOMY      Review of systems negative except as noted in HPI / PMHx or noted below:  Review of Systems  Constitutional: Negative.   HENT: Negative.   Eyes: Negative.   Respiratory: Negative.   Cardiovascular: Negative.   Gastrointestinal: Negative.   Genitourinary: Negative.   Musculoskeletal: Negative.   Skin: Negative.   Neurological: Negative.   Endo/Heme/Allergies: Negative.   Psychiatric/Behavioral: Negative.       Objective:   Vitals:   06/07/16 1344  BP: (!) 148/72  Pulse: 84  Resp: 18          Physical Exam  Constitutional: She is well-developed, well-nourished, and in no distress.  HENT:  Head: Normocephalic.  Right Ear: Tympanic membrane, external ear and ear canal normal.  Left Ear: Tympanic membrane, external ear and ear canal normal.  Nose: Nose normal. No mucosal edema or rhinorrhea.  Mouth/Throat: Uvula is midline, oropharynx is clear and moist and mucous membranes are normal. No oropharyngeal exudate.  Eyes: Conjunctivae are normal.  Neck: Trachea normal. No tracheal tenderness present. No tracheal deviation present. No thyromegaly present.  Cardiovascular: Normal rate, regular rhythm, S1 normal and S2 normal.   Murmur (systolic murmur) heard. Pulmonary/Chest: Breath sounds normal. No stridor. No respiratory distress. She has no wheezes. She has no rales.  Musculoskeletal: She exhibits no edema.  Lymphadenopathy:       Head (right side): No tonsillar adenopathy present.       Head (left side): No tonsillar adenopathy present.    She has no cervical adenopathy.  Neurological: She is alert. Gait normal.  Skin: Rash (large area of erythema and induration affecting left eyelid both top and bottom and surrounding periorbital region and urticarial lesion affecting posterior neck.) noted. She is not diaphoretic. No erythema. Nails show no clubbing.  Psychiatric: Mood and affect normal.    Diagnostics: Review of blood tests dated 06/07/2016 as preop evaluation identifies normal renal function, white blood cell count 6.6 without a differential, hemoglobin 12.4, platelet 298.  Assessment and Plan:   1. Idiopathic urticaria   2. Other allergic rhinitis   3. LPRD (laryngopharyngeal reflux disease)     1. Use the following:   A. Increase Xyzal 5mg  to twice a day  B. Start Montelukast 10mg  one time per day   C. Add benadryl if needed  2. Continue Flonase 2 sprays each  nostril once a day  3. Continue Nexium 40mg  and ranitidine 300 mg once a day  4. Review results of all blood tests  5. Futher evaluation treatment? Call clinic next week with update  Rhonda [name] has some form of immunological hyperreactivity of unknown etiologic factor. I will review all of her previous blood test that had been performed in the past 2 months and we'll make a decision about how to proceed with further evaluation pending her response to medical therapy noted above. She'll contact me noting her response to this approach. I would like to not administer any additional systemic steroids at this point in time if possible as she's had two systemic steroids over the course the past 2 months already.  Laurette Schimke, MD Allergy / Immunology [facility]

## 2016-06-07 NOTE — Progress Notes (Signed)
PCP: Dr. Melina Schoolsobyn sanders Sinus/allergy: Dr. Laurette SchimkeEric Kozlow  No cardiologist: stated had stress test 25+ yrs. Ago for chest pain and found out it was due to coughing- related to her allergies.  Pt. Stated she has has problems with allergies. She has her left eye swollen it started 06-05-16 and is much better today, there is 2 small raised areas on the lid.stated it happened back in Nov. 2017 and also in the past had an area on her arm that swelled up. She is seeing Dr. Sander RadonEric Kozol today..     States in the past he told her it was from a food allergy,not a bite from an insect.

## 2016-06-07 NOTE — Patient Instructions (Addendum)
  1. Use the following:   A. Increase Xyzal 5mg  to twice a day  B. Start Montelukast 10mg  one time per day   C. Add benadryl if needed  2. Continue Flonase 2 sprays each nostril once a day  3. Continue Nexium 40mg  and ranitidine 300 mg once a day  4. Review results of all blood tests  5. Futher evaluation treatment? Call clinic next week with update

## 2016-06-07 NOTE — Pre-Procedure Instructions (Addendum)
    Rhonda MiyamotoLuretta M [name]  06/07/2016      [facility] [place] [address] [place] [address] Phone: [phone] Fax: [phone]    Your procedure is scheduled on Wednesday, February 14.  [facility] Admitting at 6:30AM               Your surgery or procedure is scheduled for 8:30 AM   Call this number if you have problems the morning of surgery: [phone]    For any other questions, please call [phone], Monday - Friday 8 AM - 4 PM.   Remember:  Do not eat food or drink liquids after midnight Tuesday, February 13.  Take these medicines the morning of surgery with A SIP OF WATER:  May use eye drops. If needed use albuterol (PROVENTIL HFA;VENTOLIN HFA) and bring it to the hospital with you.    May take acetaminophen (TYLENOL) if needed.                1 Week prior to surgery STOP taking Aspirin, Aspirin Products (Goody Powder, Excedrin Migraine), Ibuprofen (Advil), Naproxen (Aleve), Vitamins and Herbal Products (ie Fish Oil)   Do not wear jewelry, make-up or nail polish.  Do not wear lotions, powders, or perfumes, or deodorant.  Do not shave 48 hours prior to surgery.    Do not bring valuables to the hospital.  [facility] is not responsible for any belongings or valuables.  Contacts, dentures or bridgework may not be worn into surgery.  Leave your suitcase in the car.  After surgery it may be brought to your room.  For patients admitted to the hospital, discharge time will be determined by your treatment team.  Patients discharged the day of surgery will not be allowed to drive home.   Name and phone number of your driver:   -  Special instructions:  Review  [place] - Preparing For Surgery.  Please read over the following fact sheets that you were given: Review  [place] - Preparing For Surgery, Coughing and Deep Breathing and Pain Booklet

## 2016-06-09 NOTE — Progress Notes (Signed)
Anesthesia Chart Review: Patient is a 66 year old female scheduled for open ventral hernia repair 06/15/16.   History includes non-smoker, HTN, HLD, GERD, arthritis, L5-S1 fusion '12, tonsillectomy. She was treated by allergist Dr. Lucie LeatherKozlow on 06/07/16 for idiopathic urticaria involving left periorbital region. He did not recommend repeat sytemic steroids at that time, since she had two prior courses within the past few months. BMI is consistent with obesity.   PCP is Dr. Dorothyann Pengobyn Sanders. She is not routinely followed by a cardiologist, but was seen by Dr. Sheliah MendsJens Eichhorn of [facility] in 2009/2010 and had a stress and echo (see below).  BP (!) 159/64   Pulse 76   Temp 36.5 C   Resp 20   Ht 5\' 3"  (1.6 m)   Wt 271 lb 12.8 oz (123.3 kg)   SpO2 96%   BMI 48.15 kg/m   Meds include albuterol, Cosopt ophthalmic, Zetia, Flonase, Xyzal, Singulair, Zantac, Diovan-HCT.  EKG 06/07/16: SR with first degree AV block, non-specific T wave abnormality. Since last tracing (05/07/10), no significant change.  Nuclear stress test 03/05/08 [facility]; scanned under Media tab, Correspondence 05/13/10): Impression:  Normal myocardial perfusion imaging. No significant ischemia demonstrated. This is a low risk scan. Post stress EF 76%. No significant wall motion abnormalities noted.  Echo 03/05/08 ([facility]; scanned under Media tab, Correspondence 05/13/10): Impression:  LVEF 65-75%. Borderline asymmetric LVH. No pericardial effusion.  Spirometry 06/12/15: FVC 2.08 (87%), FEV1 1.78 (94%), FEF25-75% 1.99 (89%).   Preoperative labs noted.   If no acute changes then I anticipate that she can proceed as planned.   Velna Ochsllison Zelenak, PA-C [facility]/Anesthesiology Phone [phone] 06/09/2016 2:33 PM

## 2016-06-14 MED ORDER — DEXTROSE 5 % IV SOLN
3.0000 g | INTRAVENOUS | Status: AC
  Administered 2016-06-15: 3 g via INTRAVENOUS
  Filled 2016-06-14: qty 3000

## 2016-06-15 ENCOUNTER — Inpatient Hospital Stay (HOSPITAL_COMMUNITY)
Admission: RE | Admit: 2016-06-15 | Discharge: 2016-06-28 | DRG: 353 | Disposition: A | Discharge status: Skilled Nursing Facility | Payer: Medicare Other | Source: Ambulatory Visit | Attending: General Surgery | Admitting: General Surgery

## 2016-06-15 ENCOUNTER — Inpatient Hospital Stay (HOSPITAL_COMMUNITY): Payer: Medicare Other | Admitting: Vascular Surgery

## 2016-06-15 ENCOUNTER — Encounter (HOSPITAL_COMMUNITY)
Admission: RE | Disposition: A | Discharge status: Skilled Nursing Facility | Payer: Self-pay | Source: Ambulatory Visit | Attending: General Surgery

## 2016-06-15 ENCOUNTER — Encounter (HOSPITAL_COMMUNITY): Payer: Self-pay

## 2016-06-15 ENCOUNTER — Inpatient Hospital Stay (HOSPITAL_COMMUNITY): Payer: Medicare Other | Admitting: Anesthesiology

## 2016-06-15 DIAGNOSIS — T81718A Complication of other artery following a procedure, not elsewhere classified, initial encounter: Secondary | ICD-10-CM | POA: Diagnosis not present

## 2016-06-15 DIAGNOSIS — I2690 Septic pulmonary embolism without acute cor pulmonale: Secondary | ICD-10-CM | POA: Diagnosis not present

## 2016-06-15 DIAGNOSIS — J019 Acute sinusitis, unspecified: Secondary | ICD-10-CM | POA: Diagnosis not present

## 2016-06-15 DIAGNOSIS — R0602 Shortness of breath: Secondary | ICD-10-CM

## 2016-06-15 DIAGNOSIS — E785 Hyperlipidemia, unspecified: Secondary | ICD-10-CM | POA: Diagnosis not present

## 2016-06-15 DIAGNOSIS — R531 Weakness: Secondary | ICD-10-CM | POA: Diagnosis not present

## 2016-06-15 DIAGNOSIS — K9189 Other postprocedural complications and disorders of digestive system: Secondary | ICD-10-CM | POA: Diagnosis not present

## 2016-06-15 DIAGNOSIS — K913 Postprocedural intestinal obstruction, unspecified as to partial versus complete: Secondary | ICD-10-CM | POA: Diagnosis not present

## 2016-06-15 DIAGNOSIS — E876 Hypokalemia: Secondary | ICD-10-CM | POA: Diagnosis not present

## 2016-06-15 DIAGNOSIS — R404 Transient alteration of awareness: Secondary | ICD-10-CM

## 2016-06-15 DIAGNOSIS — R0902 Hypoxemia: Secondary | ICD-10-CM | POA: Diagnosis not present

## 2016-06-15 DIAGNOSIS — Z79899 Other long term (current) drug therapy: Secondary | ICD-10-CM | POA: Diagnosis not present

## 2016-06-15 DIAGNOSIS — F05 Delirium due to known physiological condition: Secondary | ICD-10-CM | POA: Diagnosis not present

## 2016-06-15 DIAGNOSIS — I2699 Other pulmonary embolism without acute cor pulmonale: Secondary | ICD-10-CM

## 2016-06-15 DIAGNOSIS — N179 Acute kidney failure, unspecified: Secondary | ICD-10-CM | POA: Diagnosis not present

## 2016-06-15 DIAGNOSIS — R935 Abnormal findings on diagnostic imaging of other abdominal regions, including retroperitoneum: Secondary | ICD-10-CM | POA: Diagnosis not present

## 2016-06-15 DIAGNOSIS — K567 Ileus, unspecified: Secondary | ICD-10-CM | POA: Diagnosis not present

## 2016-06-15 DIAGNOSIS — Z6841 Body Mass Index (BMI) 40.0 and over, adult: Secondary | ICD-10-CM

## 2016-06-15 DIAGNOSIS — R938 Abnormal findings on diagnostic imaging of other specified body structures: Secondary | ICD-10-CM | POA: Diagnosis not present

## 2016-06-15 DIAGNOSIS — R05 Cough: Secondary | ICD-10-CM | POA: Diagnosis not present

## 2016-06-15 DIAGNOSIS — Y838 Other surgical procedures as the cause of abnormal reaction of the patient, or of later complication, without mention of misadventure at the time of the procedure: Secondary | ICD-10-CM | POA: Diagnosis not present

## 2016-06-15 DIAGNOSIS — Z981 Arthrodesis status: Secondary | ICD-10-CM | POA: Diagnosis not present

## 2016-06-15 DIAGNOSIS — E1165 Type 2 diabetes mellitus with hyperglycemia: Secondary | ICD-10-CM | POA: Diagnosis not present

## 2016-06-15 DIAGNOSIS — M6281 Muscle weakness (generalized): Secondary | ICD-10-CM | POA: Diagnosis not present

## 2016-06-15 DIAGNOSIS — E870 Hyperosmolality and hypernatremia: Secondary | ICD-10-CM | POA: Diagnosis not present

## 2016-06-15 DIAGNOSIS — K439 Ventral hernia without obstruction or gangrene: Secondary | ICD-10-CM | POA: Diagnosis not present

## 2016-06-15 DIAGNOSIS — K56609 Unspecified intestinal obstruction, unspecified as to partial versus complete obstruction: Secondary | ICD-10-CM | POA: Diagnosis not present

## 2016-06-15 DIAGNOSIS — Z9889 Other specified postprocedural states: Secondary | ICD-10-CM

## 2016-06-15 DIAGNOSIS — R110 Nausea: Secondary | ICD-10-CM | POA: Diagnosis not present

## 2016-06-15 DIAGNOSIS — I10 Essential (primary) hypertension: Secondary | ICD-10-CM | POA: Diagnosis not present

## 2016-06-15 DIAGNOSIS — R2681 Unsteadiness on feet: Secondary | ICD-10-CM | POA: Diagnosis not present

## 2016-06-15 DIAGNOSIS — R52 Pain, unspecified: Secondary | ICD-10-CM

## 2016-06-15 DIAGNOSIS — Z4682 Encounter for fitting and adjustment of non-vascular catheter: Secondary | ICD-10-CM | POA: Diagnosis not present

## 2016-06-15 DIAGNOSIS — Z4659 Encounter for fitting and adjustment of other gastrointestinal appliance and device: Secondary | ICD-10-CM

## 2016-06-15 DIAGNOSIS — K219 Gastro-esophageal reflux disease without esophagitis: Secondary | ICD-10-CM | POA: Diagnosis not present

## 2016-06-15 DIAGNOSIS — Z48815 Encounter for surgical aftercare following surgery on the digestive system: Secondary | ICD-10-CM | POA: Diagnosis not present

## 2016-06-15 DIAGNOSIS — Z8719 Personal history of other diseases of the digestive system: Secondary | ICD-10-CM

## 2016-06-15 HISTORY — PX: INSERTION OF MESH: SHX5868

## 2016-06-15 HISTORY — PX: VENTRAL HERNIA REPAIR: SHX424

## 2016-06-15 HISTORY — DX: Headache: R51

## 2016-06-15 HISTORY — DX: Headache, unspecified: R51.9

## 2016-06-15 HISTORY — PX: ABDOMINAL HERNIA REPAIR: SHX539

## 2016-06-15 HISTORY — DX: Nontoxic single thyroid nodule: E04.1

## 2016-06-15 HISTORY — DX: Anemia, unspecified: D64.9

## 2016-06-15 SURGERY — REPAIR, HERNIA, VENTRAL
Anesthesia: General | Site: Abdomen

## 2016-06-15 MED ORDER — FENTANYL CITRATE (PF) 100 MCG/2ML IJ SOLN
INTRAMUSCULAR | Status: AC
  Filled 2016-06-15: qty 2

## 2016-06-15 MED ORDER — MEPERIDINE HCL 25 MG/ML IJ SOLN: 6.2500 mg | INTRAMUSCULAR | Status: DC | PRN

## 2016-06-15 MED ORDER — BOOST / RESOURCE BREEZE PO LIQD
1.0000 | Freq: Three times a day (TID) | ORAL | Status: DC
  Administered 2016-06-15 – 2016-06-19 (×10): 1 via ORAL

## 2016-06-15 MED ORDER — METHOCARBAMOL 500 MG PO TABS
500.0000 mg | ORAL_TABLET | Freq: Four times a day (QID) | ORAL | Status: DC | PRN
  Administered 2016-06-16 – 2016-06-17 (×3): 500 mg via ORAL
  Filled 2016-06-15 (×4): qty 1

## 2016-06-15 MED ORDER — VALSARTAN-HYDROCHLOROTHIAZIDE 320-25 MG PO TABS: 1.0000 | ORAL_TABLET | Freq: Every day | ORAL | Status: DC

## 2016-06-15 MED ORDER — ONDANSETRON HCL 4 MG/2ML IJ SOLN
INTRAMUSCULAR | Status: AC
  Filled 2016-06-15: qty 2

## 2016-06-15 MED ORDER — HYDROCHLOROTHIAZIDE 25 MG PO TABS
25.0000 mg | ORAL_TABLET | Freq: Every day | ORAL | Status: DC
  Administered 2016-06-16 – 2016-06-19 (×4): 25 mg via ORAL
  Filled 2016-06-15 (×6): qty 1

## 2016-06-15 MED ORDER — ADULT MULTIVITAMIN W/MINERALS CH
1.0000 | ORAL_TABLET | Freq: Every day | ORAL | Status: DC
  Administered 2016-06-16 – 2016-06-26 (×7): 1 via ORAL
  Filled 2016-06-15 (×12): qty 1

## 2016-06-15 MED ORDER — ZOLPIDEM TARTRATE 5 MG PO TABS
5.0000 mg | ORAL_TABLET | Freq: Every evening | ORAL | Status: DC | PRN
  Administered 2016-06-20: 5 mg via ORAL
  Filled 2016-06-15: qty 1

## 2016-06-15 MED ORDER — 0.9 % SODIUM CHLORIDE (POUR BTL) OPTIME
TOPICAL | Status: DC | PRN
  Administered 2016-06-15: 2000 mL

## 2016-06-15 MED ORDER — DORZOLAMIDE HCL-TIMOLOL MAL 2-0.5 % OP SOLN
1.0000 [drp] | Freq: Two times a day (BID) | OPHTHALMIC | Status: DC
  Administered 2016-06-15 – 2016-06-28 (×26): 1 [drp] via OPHTHALMIC
  Filled 2016-06-15 (×2): qty 10

## 2016-06-15 MED ORDER — HYDROMORPHONE HCL 2 MG/ML IJ SOLN
1.0000 mg | INTRAMUSCULAR | Status: DC | PRN
  Administered 2016-06-15: 1 mg via INTRAVENOUS
  Administered 2016-06-15 (×2): 2 mg via INTRAVENOUS
  Administered 2016-06-16: 1 mg via INTRAVENOUS
  Administered 2016-06-16 (×3): 2 mg via INTRAVENOUS
  Filled 2016-06-15 (×7): qty 1

## 2016-06-15 MED ORDER — FENTANYL CITRATE (PF) 100 MCG/2ML IJ SOLN
INTRAMUSCULAR | Status: AC
  Administered 2016-06-15: 50 ug via INTRAVENOUS
  Filled 2016-06-15: qty 2

## 2016-06-15 MED ORDER — FENTANYL CITRATE (PF) 100 MCG/2ML IJ SOLN
25.0000 ug | INTRAMUSCULAR | Status: DC | PRN
  Administered 2016-06-15 (×3): 50 ug via INTRAVENOUS

## 2016-06-15 MED ORDER — HYDROMORPHONE HCL 1 MG/ML IJ SOLN
INTRAMUSCULAR | Status: DC | PRN
  Administered 2016-06-15: 1 mg via INTRAVENOUS

## 2016-06-15 MED ORDER — CHLORHEXIDINE GLUCONATE CLOTH 2 % EX PADS: 6.0000 | MEDICATED_PAD | Freq: Once | CUTANEOUS | Status: DC

## 2016-06-15 MED ORDER — ROCURONIUM BROMIDE 50 MG/5ML IV SOSY
PREFILLED_SYRINGE | INTRAVENOUS | Status: AC
  Filled 2016-06-15: qty 5

## 2016-06-15 MED ORDER — FENTANYL CITRATE (PF) 100 MCG/2ML IJ SOLN
INTRAMUSCULAR | Status: DC | PRN
  Administered 2016-06-15: 100 ug via INTRAVENOUS
  Administered 2016-06-15 (×4): 50 ug via INTRAVENOUS

## 2016-06-15 MED ORDER — EVICEL 5 ML EX KIT
PACK | CUTANEOUS | Status: DC | PRN
  Administered 2016-06-15: 5 mL

## 2016-06-15 MED ORDER — SUGAMMADEX SODIUM 500 MG/5ML IV SOLN
INTRAVENOUS | Status: AC
  Filled 2016-06-15: qty 5

## 2016-06-15 MED ORDER — EVICEL 5 ML EX KIT
PACK | CUTANEOUS | Status: AC
Start: 2016-06-15 — End: 2016-06-15
  Filled 2016-06-15: qty 2

## 2016-06-15 MED ORDER — MIDAZOLAM HCL 2 MG/2ML IJ SOLN
INTRAMUSCULAR | Status: AC
  Filled 2016-06-15: qty 2

## 2016-06-15 MED ORDER — MONTELUKAST SODIUM 10 MG PO TABS
10.0000 mg | ORAL_TABLET | Freq: Every day | ORAL | Status: DC
  Administered 2016-06-15 – 2016-06-19 (×5): 10 mg via ORAL
  Filled 2016-06-15 (×6): qty 1

## 2016-06-15 MED ORDER — ONDANSETRON 4 MG PO TBDP: 4.0000 mg | ORAL_TABLET | Freq: Four times a day (QID) | ORAL | Status: DC | PRN

## 2016-06-15 MED ORDER — PROMETHAZINE HCL 25 MG/ML IJ SOLN
INTRAMUSCULAR | Status: AC
  Administered 2016-06-15: 6.25 mg via INTRAVENOUS
  Filled 2016-06-15: qty 1

## 2016-06-15 MED ORDER — HYDROMORPHONE HCL 1 MG/ML IJ SOLN
INTRAMUSCULAR | Status: AC
  Filled 2016-06-15: qty 1

## 2016-06-15 MED ORDER — IRBESARTAN 300 MG PO TABS
300.0000 mg | ORAL_TABLET | Freq: Every day | ORAL | Status: DC
  Administered 2016-06-16 – 2016-06-19 (×4): 300 mg via ORAL
  Filled 2016-06-15 (×6): qty 1

## 2016-06-15 MED ORDER — MIDAZOLAM HCL 5 MG/5ML IJ SOLN
INTRAMUSCULAR | Status: DC | PRN
  Administered 2016-06-15: 2 mg via INTRAVENOUS

## 2016-06-15 MED ORDER — ALBUTEROL SULFATE (2.5 MG/3ML) 0.083% IN NEBU
2.5000 mg | INHALATION_SOLUTION | Freq: Four times a day (QID) | RESPIRATORY_TRACT | Status: DC | PRN
  Administered 2016-06-16 – 2016-06-25 (×11): 2.5 mg via RESPIRATORY_TRACT
  Filled 2016-06-15 (×11): qty 3

## 2016-06-15 MED ORDER — LACTATED RINGERS IV SOLN
INTRAVENOUS | Status: DC | PRN
  Administered 2016-06-15 (×3): via INTRAVENOUS

## 2016-06-15 MED ORDER — OXYCODONE HCL 5 MG PO TABS
5.0000 mg | ORAL_TABLET | ORAL | Status: DC | PRN
  Administered 2016-06-16 – 2016-06-17 (×2): 10 mg via ORAL
  Filled 2016-06-15 (×3): qty 2

## 2016-06-15 MED ORDER — ROCURONIUM BROMIDE 100 MG/10ML IV SOLN
INTRAVENOUS | Status: DC | PRN
  Administered 2016-06-15: 50 mg via INTRAVENOUS
  Administered 2016-06-15: 30 mg via INTRAVENOUS

## 2016-06-15 MED ORDER — PHENYLEPHRINE HCL 10 MG/ML IJ SOLN
INTRAMUSCULAR | Status: DC | PRN
  Administered 2016-06-15: 120 ug via INTRAVENOUS

## 2016-06-15 MED ORDER — PROMETHAZINE HCL 25 MG/ML IJ SOLN
6.2500 mg | INTRAMUSCULAR | Status: DC | PRN
  Administered 2016-06-15: 6.25 mg via INTRAVENOUS

## 2016-06-15 MED ORDER — PROPOFOL 10 MG/ML IV BOLUS
INTRAVENOUS | Status: AC
  Filled 2016-06-15: qty 20

## 2016-06-15 MED ORDER — ONDANSETRON HCL 4 MG/2ML IJ SOLN
4.0000 mg | Freq: Four times a day (QID) | INTRAMUSCULAR | Status: DC | PRN
  Administered 2016-06-15 – 2016-06-16 (×2): 4 mg via INTRAVENOUS
  Filled 2016-06-15 (×2): qty 2

## 2016-06-15 MED ORDER — PROPOFOL 10 MG/ML IV BOLUS
INTRAVENOUS | Status: DC | PRN
  Administered 2016-06-15: 200 mg via INTRAVENOUS

## 2016-06-15 MED ORDER — ENOXAPARIN SODIUM 40 MG/0.4ML ~~LOC~~ SOLN
40.0000 mg | SUBCUTANEOUS | Status: DC
  Administered 2016-06-16 – 2016-06-19 (×4): 40 mg via SUBCUTANEOUS
  Filled 2016-06-15 (×4): qty 0.4

## 2016-06-15 MED ORDER — LIDOCAINE 2% (20 MG/ML) 5 ML SYRINGE
INTRAMUSCULAR | Status: AC
  Filled 2016-06-15: qty 5

## 2016-06-15 MED ORDER — DEXTROSE-NACL 5-0.9 % IV SOLN
INTRAVENOUS | Status: DC
  Administered 2016-06-15 – 2016-06-16 (×4): via INTRAVENOUS

## 2016-06-15 MED ORDER — SUGAMMADEX SODIUM 500 MG/5ML IV SOLN
INTRAVENOUS | Status: DC | PRN
  Administered 2016-06-15: 400 mg via INTRAVENOUS

## 2016-06-15 MED ORDER — LIDOCAINE HCL (CARDIAC) 20 MG/ML IV SOLN
INTRAVENOUS | Status: DC | PRN
  Administered 2016-06-15: 100 mg via INTRAVENOUS

## 2016-06-15 MED ORDER — ONDANSETRON HCL 4 MG/2ML IJ SOLN
INTRAMUSCULAR | Status: DC | PRN
  Administered 2016-06-15: 4 mg via INTRAVENOUS

## 2016-06-15 MED ORDER — EZETIMIBE 10 MG PO TABS
10.0000 mg | ORAL_TABLET | Freq: Every evening | ORAL | Status: DC
  Administered 2016-06-15 – 2016-06-19 (×5): 10 mg via ORAL
  Filled 2016-06-15 (×6): qty 1

## 2016-06-15 SURGICAL SUPPLY — 47 items
ADH SKN CLS APL DERMABOND .7 (GAUZE/BANDAGES/DRESSINGS) ×1
BINDER ABDOMINAL 12 ML 46-62 (SOFTGOODS) ×1 IMPLANT
BLADE 11 SAFETY STRL DISP (BLADE) ×1 IMPLANT
BLADE SURG ROTATE 9660 (MISCELLANEOUS) IMPLANT
COVER SURGICAL LIGHT HANDLE (MISCELLANEOUS) ×2 IMPLANT
DERMABOND ADVANCED (GAUZE/BANDAGES/DRESSINGS) ×1
DERMABOND ADVANCED .7 DNX12 (GAUZE/BANDAGES/DRESSINGS) IMPLANT
DEVICE TROCAR PUNCTURE CLOSURE (ENDOMECHANICALS) ×1 IMPLANT
DRAIN CHANNEL 19F RND (DRAIN) IMPLANT
DRAPE INCISE IOBAN 66X45 STRL (DRAPES) ×1 IMPLANT
DRAPE LAPAROSCOPIC ABDOMINAL (DRAPES) ×2 IMPLANT
DRSG OPSITE POSTOP 4X10 (GAUZE/BANDAGES/DRESSINGS) ×1 IMPLANT
ELECT CAUTERY BLADE 6.4 (BLADE) ×2 IMPLANT
ELECT REM PT RETURN 9FT ADLT (ELECTROSURGICAL) ×2
ELECTRODE REM PT RTRN 9FT ADLT (ELECTROSURGICAL) ×1 IMPLANT
EVACUATOR SILICONE 100CC (DRAIN) IMPLANT
GAUZE SPONGE 4X4 12PLY STRL (GAUZE/BANDAGES/DRESSINGS) IMPLANT
GLOVE BIO SURGEON STRL SZ7.5 (GLOVE) ×3 IMPLANT
GLOVE BIOGEL PI IND STRL 7.5 (GLOVE) IMPLANT
GLOVE BIOGEL PI IND STRL 8 (GLOVE) ×1 IMPLANT
GLOVE BIOGEL PI INDICATOR 7.5 (GLOVE) ×2
GLOVE BIOGEL PI INDICATOR 8 (GLOVE) ×1
GOWN STRL REUS W/ TWL LRG LVL3 (GOWN DISPOSABLE) ×1 IMPLANT
GOWN STRL REUS W/ TWL XL LVL3 (GOWN DISPOSABLE) ×1 IMPLANT
GOWN STRL REUS W/TWL LRG LVL3 (GOWN DISPOSABLE) ×4
GOWN STRL REUS W/TWL XL LVL3 (GOWN DISPOSABLE) ×2
KIT BASIN OR (CUSTOM PROCEDURE TRAY) ×2 IMPLANT
KIT ROOM TURNOVER OR (KITS) ×2 IMPLANT
MESH ULTRAPRO 12X12 30X30CM (Mesh General) ×1 IMPLANT
NS IRRIG 1000ML POUR BTL (IV SOLUTION) ×2 IMPLANT
PACK GENERAL/GYN (CUSTOM PROCEDURE TRAY) ×2 IMPLANT
PAD ARMBOARD 7.5X6 YLW CONV (MISCELLANEOUS) ×4 IMPLANT
RETAINER VISCERA MED (MISCELLANEOUS) ×1 IMPLANT
SLEEVE SURGEON STRL (DRAPES) ×1 IMPLANT
SPONGE LAP 18X18 X RAY DECT (DISPOSABLE) ×2 IMPLANT
STAPLER VISISTAT 35W (STAPLE) ×1 IMPLANT
SUT ETHILON 3 0 FSL (SUTURE) IMPLANT
SUT NOVA NAB GS-21 0 18 T12 DT (SUTURE) ×3 IMPLANT
SUT PDS AB 0 CT 36 (SUTURE) IMPLANT
SUT PDS AB 1 TP1 54 (SUTURE) ×2 IMPLANT
SUT PDS AB 2-0 CT1 27 (SUTURE) ×2 IMPLANT
SUT VIC AB 3-0 SH 18 (SUTURE) ×1 IMPLANT
SUT VIC AB 3-0 SH 8-18 (SUTURE) ×1 IMPLANT
SUT VICRYL AB 2 0 TIES (SUTURE) ×3 IMPLANT
TOWEL OR 17X24 6PK STRL BLUE (TOWEL DISPOSABLE) ×2 IMPLANT
TOWEL OR 17X26 10 PK STRL BLUE (TOWEL DISPOSABLE) ×2 IMPLANT
TRAY FOLEY CATH 16FR SILVER (SET/KITS/TRAYS/PACK) ×1 IMPLANT

## 2016-06-15 NOTE — Anesthesia Postprocedure Evaluation (Signed)
Anesthesia Post Note  Patient: Ronaldo MiyamotoLuretta M Dulac  Procedure(s) Performed: Procedure(s) (LRB): OPEN VENTRAL HERNIA REPAIR (N/A) INSERTION OF ETHICON ULTRAPRO MESH (N/A)  Patient location during evaluation: PACU Anesthesia Type: General Level of consciousness: awake and sedated Pain management: pain level controlled Vital Signs Assessment: post-procedure vital signs reviewed and stable Respiratory status: spontaneous breathing Cardiovascular status: stable Postop Assessment: no signs of nausea or vomiting Anesthetic complications: no        Last Vitals:  Vitals:   06/15/16 1147 06/15/16 1202  BP: (!) 176/76 (!) 178/77  Pulse: 71 71  Resp: 14 14  Temp:      Last Pain:  Vitals:   06/15/16 1145  TempSrc:   PainSc: 7    Pain Goal: Patients Stated Pain Goal: 5 (06/15/16 0708)               Ashani Pumphrey JR,JOHN Susann GivensFRANKLIN

## 2016-06-15 NOTE — Transfer of Care (Signed)
Immediate Anesthesia Transfer of Care Note  Patient: Rhonda [name]  Procedure(s) Performed: Procedure(s): OPEN VENTRAL HERNIA REPAIR (N/A) INSERTION OF ETHICON ULTRAPRO MESH (N/A)  Patient Location: PACU  Anesthesia Type:General  Level of Consciousness: awake, alert  and patient cooperative  Airway & Oxygen Therapy: Patient Spontanous Breathing and Patient connected to nasal cannula oxygen  Post-op Assessment: Report given to RN and Post -op Vital signs reviewed and stable  Post vital signs: Reviewed and stable  Last Vitals:  Vitals:   06/15/16 0653 06/15/16 1116  BP: (!) 147/63   Pulse: 75   Resp: 18   Temp: 37 C (P) 36.3 C    Last Pain:  Vitals:   06/15/16 1116  TempSrc:   PainSc: (P) Asleep      Patients Stated Pain Goal: 5 (06/15/16 0708)  Complications: No apparent anesthesia complications

## 2016-06-15 NOTE — Progress Notes (Signed)
Received to 6n7 from PACU at this time, family at bedside, pt denies nausea, c/o abd pain 4/10, oriented to room and surroundings

## 2016-06-15 NOTE — Anesthesia Procedure Notes (Signed)
Procedure Name: Intubation Date/Time: 06/15/2016 8:38 AM Performed by: Manuela Schwartz B Pre-anesthesia Checklist: Patient identified, Emergency Drugs available, Suction available, Patient being monitored and Timeout performed Patient Re-evaluated:Patient Re-evaluated prior to inductionOxygen Delivery Method: Circle system utilized Preoxygenation: Pre-oxygenation with 100% oxygen Intubation Type: IV induction Ventilation: Mask ventilation without difficulty Laryngoscope Size: Mac and 3 Grade View: Grade I Tube type: Oral Tube size: 7.0 mm Airway Equipment and Method: Stylet Placement Confirmation: ETT inserted through vocal cords under direct vision,  positive ETCO2 and breath sounds checked- equal and bilateral Secured at: 21 cm Tube secured with: Tape Dental Injury: Teeth and Oropharynx as per pre-operative assessment

## 2016-06-15 NOTE — Anesthesia Preprocedure Evaluation (Signed)
Anesthesia Evaluation  Patient identified by MRN, date of birth, ID band Patient awake    Reviewed: NPO status , Patient's Chart, lab work & pertinent test results, reviewed documented beta blocker date and time   Airway Mallampati: II       Dental  (+) Poor Dentition, Missing   Pulmonary neg pulmonary ROS,    breath sounds clear to auscultation       Cardiovascular hypertension, Pt. on medications Normal cardiovascular exam     Neuro/Psych negative neurological ROS  negative psych ROS   GI/Hepatic Neg liver ROS, GERD  Medicated and Controlled,  Endo/Other  Morbid obesity  Renal/GU negative Renal ROS  negative genitourinary   Musculoskeletal   Abdominal (+) + obese,   Peds  Hematology negative hematology ROS (+)   Anesthesia Other Findings   Reproductive/Obstetrics negative OB ROS                             Anesthesia Physical Anesthesia Plan  ASA: III  Anesthesia Plan: General   Post-op Pain Management:    Induction: Intravenous  Airway Management Planned: Oral ETT  Additional Equipment:   Intra-op Plan:   Post-operative Plan: Extubation in OR  Informed Consent: I have reviewed the patients History and Physical, chart, labs and discussed the procedure including the risks, benefits and alternatives for the proposed anesthesia with the patient or authorized representative who has indicated his/her understanding and acceptance.     Plan Discussed with: CRNA and Surgeon  Anesthesia Plan Comments:         Anesthesia Quick Evaluation

## 2016-06-15 NOTE — H&P (Signed)
The patient is a 66 year old female who presents with an abdominal wall hernia. patient is a 66 year old female who is a long-term follow-up for evaluation of a ventral hernia. Patient states that she doesn't hernias gotten larger. Patient recently had a colonoscopy was unable to have a colonoscopy completed secondary to her hernia. Patient has lost approximately 10 pounds since her last visit approximately 6 months ago.  Patient's had no signs or symptoms of incarceration or strangulation.   Allergies Aspirin *ANALGESICS - NonNarcotic*  Codeine Phosphate *ANALGESICS - OPIOID*   Medication History Ranitidine HCl (300MG  Tablet, Oral) Active. Levocetirizine Dihydrochloride (5MG  Tablet, Oral) Active. Vitamin D2 (2000UNIT Tablet, Oral) Active. Nexium 24HR (20MG  Capsule DR, Oral) Active. Dorzolamide HCl-Timolol Mal (22.3-6.8MG /ML Solution, Ophthalmic) Active. Valsartan-Hydrochlorothiazide (320-25MG  Tablet, Oral) Active. Ezetimibe (10MG  Tablet, Oral) Active. Medications Reconciled  BP (!) 147/63   Pulse 75   Temp 98.6 F (37 C) (Oral)   Resp 18   SpO2 100%    Physical Exam  General Mental Status-Alert. General Appearance-Consistent with stated age. Hydration-Well hydrated. Voice-Normal.  Head and Neck Head-normocephalic, atraumatic with no lesions or palpable masses. Trachea-midline. Thyroid Gland Characteristics - normal size and consistency.  Chest and Lung Exam Chest and lung exam reveals -quiet, even and easy respiratory effort with no use of accessory muscles and on auscultation, normal breath sounds, no adventitious sounds and normal vocal resonance. Inspection Chest Wall - Normal. Back - normal.  Cardiovascular Cardiovascular examination reveals -normal heart sounds, regular rate and rhythm with no murmurs and normal pedal pulses bilaterally.  Abdomen Inspection Skin - Scar - no surgical scars. Hernias - Ventral -  Reducible(Periumbilical ventral hernia. Rectus diastases.). Palpation/Percussion Normal exam - Soft, Non Tender, No Rebound tenderness, No Rigidity (guarding) and No hepatosplenomegaly. Auscultation Normal exam - Bowel sounds normal.    Assessment & Plan VENTRAL HERNIA WITHOUT OBSTRUCTION OR GANGRENE (K43.9) Impression: [age] year old female with a large ventral hernia. Patient has continue with weight loss. She's on the correct trend for weight loss. I do believe it is beneficial at this point to proceed with surgery.  1. The patient would like to proceed to the operating for a open ventral hernia repair with mesh,TAR approach. 2. All risks and benefits were discussed with the patient to generally include, but not limited to: infection, bleeding, damage to surrounding structures, acute and chronic nerve pain, and recurrence. Alternatives were offered and described. All questions were answered and the patient voiced understanding of the procedure and wishes to proceed at this point with hernia repair.

## 2016-06-15 NOTE — Op Note (Signed)
06/15/2016  11:05 AM  PATIENT:  Rhonda [name]  66 y.o. female  PRE-OPERATIVE DIAGNOSIS:  ventral hernia  POST-OPERATIVE DIAGNOSIS:  ventral hernia  PROCEDURE:  Procedure(s): OPEN VENTRAL HERNIA REPAIR (N/A) INSERTION OF ETHICON ULTRAPRO MESH 30cm x 30cm(N/A)  SURGEON:  Surgeon(s) and Role:    * Axel Filler, MD - Primary  ANESTHESIA:   local and general  EBL:  Total I/O In: 2000 [I.V.:2000] Out: 350 [Urine:200; Blood:150]  BLOOD ADMINISTERED:none  DRAINS: none   LOCAL MEDICATIONS USED:  BUPIVICAINE   SPECIMEN:  No Specimen  DISPOSITION OF SPECIMEN:  N/A  COUNTS:  YES  TOURNIQUET:  * No tourniquets in log *  DICTATION: .Dragon Dictation After the patient was consented he was taken back to the operating room and placed in the supine position with bilateral SCDs in place. The patient was prepped and draped in usual sterile fashion. Antibiotics were confirmed and timeout was called and all facts verified.  A #10 blade was used to make a midline incision.  I proceeded to use electrocautery to maintain hemostasis and dissection took place in the most superior portion of the wound down to the subcutaneous tissues fat to the midline fascia. This was incised. The fascia was elevated and cautery was used to help with hemostasis.  The peritoneum was bluntly entered. There appeared to be some omentum in the hernia sac.  The hernia sac was then circumfrentially dissected away.  There was no bowel within the hernia sac.  Inferior to this there was no adhesions to the anterior abdominal wall. The fascia was incised to the rest of incision.  The hernia sac was excised.  There were minimal interior adhesions. The umbilicus was involved with the hernia sac, and resected.  At this time I proceeded to create subcutaneous flaps to the bilateral midline portions of the anterior abdominal wall. All perforators that could be saved were kept and swept away.  At this time I proceeded to incise  the posterior rectus fascia.  The rectus muscle was then dissected away from the posterior rectus fascia.  This was done to the perforators.  This was done bilaterally.  This easily allowed the posterior rectus sheath to be advanced bilaterally to the midline.  There was a small violation to the posterior rectus sheath on the right.  This was reapproximated with figure of eight stitches.   The posterior midline fascia was then reapproximated with a 2-0 PDS in a standard running fashion. The area was irrigated out.  Hemostasis was excellent.  At this time a piece of Ethicon Ultrapro 30cm x30cm mesh was placed into the retrorectus space. #1 Novafils were used in interrupted fashion with an endoclose decive as transfascial sutures x 3  Bilateral and 2superiorly and 3 inferiorly.  This allowed the mesh to be taught.  This area was irrigated out.  Eviseal fibrin glue was used to secure the mesh medially over the midline fascia.  #1 PDS  Was used in a standard running fashion. The anterior midline fascia was approximated with undue tension. The subcutaneous layer was then irrigated out with sterile saline.  At this time the skin was reapproximated using skin staples. The patient tolerated the procedure well was taken to the recovery room with an abdominal binder in stable condition.      PLAN OF CARE: Admit to inpatient   PATIENT DISPOSITION:  PACU - hemodynamically stable.   Delay start of Pharmacological VTE agent (>24hrs) due to surgical blood loss or risk of  bleeding: not applicable

## 2016-06-16 ENCOUNTER — Encounter (HOSPITAL_COMMUNITY): Payer: Self-pay | Admitting: General Surgery

## 2016-06-16 LAB — BASIC METABOLIC PANEL
Anion gap: 8 (ref 5–15)
BUN: 15 mg/dL (ref 6–20)
CALCIUM: 8.9 mg/dL (ref 8.9–10.3)
CO2: 28 mmol/L (ref 22–32)
CREATININE: 0.81 mg/dL (ref 0.44–1.00)
Chloride: 104 mmol/L (ref 101–111)
GFR calc Af Amer: >60 mL/min (ref >60)
Glucose, Bld: 167 mg/dL — ABNORMAL HIGH (ref 65–99)
Potassium: 3.8 mmol/L (ref 3.5–5.1)
SODIUM: 140 mmol/L (ref 135–145)

## 2016-06-16 LAB — CBC
HCT: 35.7 % — ABNORMAL LOW (ref 36.0–46.0)
Hemoglobin: 11.6 g/dL — ABNORMAL LOW (ref 12.0–15.0)
MCH: 28.3 pg (ref 26.0–34.0)
MCHC: 32.5 g/dL (ref 30.0–36.0)
MCV: 87.1 fL (ref 78.0–100.0)
PLATELETS: 321 10*3/uL (ref 150–400)
RBC: 4.1 MIL/uL (ref 3.87–5.11)
RDW: 15 % (ref 11.5–15.5)
WBC: 10.9 10*3/uL — AB (ref 4.0–10.5)

## 2016-06-16 LAB — GLUCOSE, CAPILLARY
GLUCOSE-CAPILLARY: 150 mg/dL — AB (ref 65–99)
GLUCOSE-CAPILLARY: 144 mg/dL — AB (ref 65–99)
Glucose-Capillary: 157 mg/dL — ABNORMAL HIGH (ref 65–99)

## 2016-06-16 MED ORDER — ONDANSETRON HCL 4 MG/2ML IJ SOLN
4.0000 mg | INTRAMUSCULAR | Status: DC | PRN
  Administered 2016-06-16 – 2016-06-26 (×18): 4 mg via INTRAVENOUS
  Filled 2016-06-16 (×19): qty 2

## 2016-06-16 MED ORDER — ONDANSETRON 4 MG PO TBDP
4.0000 mg | ORAL_TABLET | ORAL | Status: DC | PRN
  Filled 2016-06-16: qty 1

## 2016-06-16 MED ORDER — ACETAMINOPHEN 325 MG PO TABS: 650.0000 mg | ORAL_TABLET | Freq: Four times a day (QID) | ORAL | Status: DC | PRN

## 2016-06-16 MED ORDER — SENNA 8.6 MG PO TABS
1.0000 | ORAL_TABLET | Freq: Two times a day (BID) | ORAL | Status: DC
  Administered 2016-06-16 – 2016-06-27 (×9): 8.6 mg via ORAL
  Filled 2016-06-16 (×18): qty 1

## 2016-06-16 MED ORDER — DOCUSATE SODIUM 100 MG PO CAPS
100.0000 mg | ORAL_CAPSULE | Freq: Two times a day (BID) | ORAL | Status: DC
  Administered 2016-06-16 – 2016-06-19 (×8): 100 mg via ORAL
  Filled 2016-06-16 (×11): qty 1

## 2016-06-16 MED ORDER — ORAL CARE MOUTH RINSE
15.0000 mL | Freq: Two times a day (BID) | OROMUCOSAL | Status: DC
  Administered 2016-06-16 – 2016-06-20 (×9): 15 mL via OROMUCOSAL

## 2016-06-16 MED ORDER — METOCLOPRAMIDE HCL 5 MG/ML IJ SOLN
5.0000 mg | Freq: Four times a day (QID) | INTRAMUSCULAR | Status: DC
  Administered 2016-06-16 – 2016-06-19 (×12): 5 mg via INTRAVENOUS
  Filled 2016-06-16 (×12): qty 2

## 2016-06-16 MED ORDER — INSULIN ASPART 100 UNIT/ML ~~LOC~~ SOLN
0.0000 [IU] | Freq: Three times a day (TID) | SUBCUTANEOUS | Status: DC
  Administered 2016-06-16: 2 [IU] via SUBCUTANEOUS
  Administered 2016-06-16: 3 [IU] via SUBCUTANEOUS
  Administered 2016-06-17: 2 [IU] via SUBCUTANEOUS

## 2016-06-16 NOTE — Progress Notes (Signed)
Pt nauseated last night and refused to try po pain meds. Requesting IV pain meds been given. Feeling better this morning and no nausea.

## 2016-06-16 NOTE — Progress Notes (Signed)
Initial Nutrition Assessment  DOCUMENTATION CODES:   Morbid obesity  INTERVENTION:   -Continue Boost Breeze po TID, each supplement provides 250 kcal and 9 grams of protein  NUTRITION DIAGNOSIS:   Inadequate oral intake related to altered GI function, nausea as evidenced by meal completion < 25%.  GOAL:   Patient will meet greater than or equal to 90% of their needs  MONITOR:   PO intake, Supplement acceptance, Diet advancement, Labs, Weight trends, Skin, I & O's  REASON FOR ASSESSMENT:   Malnutrition Screening Tool    ASSESSMENT:   The patient is a 66 year old female who presents with an abdominal wall hernia. patient is a 66 year old female who is a long-term follow-up for evaluation of a ventral hernia.  Patient states that she doesn't hernias gotten larger.  Patient recently had a colonoscopy was unable to have a colonoscopy completed secondary to her hernia.  S/p Procedure(s) on 06/15/16: OPEN VENTRAL HERNIA REPAIR (N/A) INSERTION OF ETHICON ULTRAPRO MESH 30cm x 30cm(N/A)  Pt reports good appetite PTA, however, intake has been minimal (PO: 25%) secondary to nausea. Per RN, pt received nausea medication earlier this shift. Pt consumed about 2 meals per day; expresses to this RD that she has also been experiencing stress at home caring for her elderly mother.  Pt reports UBW around 270#, however, shares that she was intentionally trying to lose weight. She admits she gained around 5# during the holidays.   Pt consumed about 50% of Boost Breeze supplement, which she enjoyed. Pt reports she was surprised she was given the supplement ("I thought I didn't need Boost"). RD discussed importance of good meal (particularly protein intake) to aid in post-op healing. Also discussed principles and rationale for clear liquid diet. Pt amenable to continue with supplement until diet is advanced.   Nutrition-Focused physical exam completed. Findings are no fat depletion, no muscle  depletion, and mild edema.   Labs reviewed: CBGS: 150.   Diet Order:  Diet clear liquid Room service appropriate? Yes; Fluid consistency: Thin  Skin:   (closed abdominal incision)  Last BM:  06/15/16  Height:   Ht Readings from Last 1 Encounters:  06/16/16 5\' 3"  (1.6 m)    Weight:   Wt Readings from Last 1 Encounters:  06/16/16 265 lb (120.2 kg)    Ideal Body Weight:  52.2 kg  BMI:  Body mass index is 46.94 kg/m.  Estimated Nutritional Needs:   Kcal:  1600-1800  Protein:  75-90 grams  Fluid:  1.6-1.8 L  EDUCATION NEEDS:   Education needs addressed  Bakari Nikolai A. Mayford KnifeWilliams, RD, LDN, CDE Pager: [phone] After hours Pager: [phone]

## 2016-06-16 NOTE — Progress Notes (Signed)
S: no acute events, pain well controlled overnight. Nausea with emesis x 1 this morning when she got up for the first time. Foley removed and has voided.   Vitals, labs, intake/output, and orders reviewed at this time. Blood glucose 167 labs otherwise normal. Tm 99, HR 70-93, BP 150s/70s, sats 94-100%; UOP adequate  Gen: A&Ox3, no distress  Chest: unlabored respirations, RRR Abd: soft, appropriately tender, minimally distended, incision c/d/i with minimal staining on honeycomb.  Ext: warm, no edema Neuro: grossly normal  Lines/tubes/drains: PIV  A/P:  POD 1 s/p open ventral hernia repair -out of bed/mobilize today, continue with abdominal binder -Pulmonary toilet, incentive spirometry -stay on sips of clears for now, will monitor for ongoing nausea -add tylenol, bowel regimen, sliding scale insulin -Recheck labs in AM   Rhonda Blakeshelsea Caprice Mccaffrey, MD [facility], [place] Pager [phone]

## 2016-06-17 LAB — CBC
HCT: 33.4 % — ABNORMAL LOW (ref 36.0–46.0)
HEMOGLOBIN: 10.6 g/dL — AB (ref 12.0–15.0)
MCH: 28 pg (ref 26.0–34.0)
MCHC: 31.7 g/dL (ref 30.0–36.0)
MCV: 88.4 fL (ref 78.0–100.0)
Platelets: 261 10*3/uL (ref 150–400)
RBC: 3.78 MIL/uL — ABNORMAL LOW (ref 3.87–5.11)
RDW: 15.1 % (ref 11.5–15.5)
WBC: 12.7 10*3/uL — ABNORMAL HIGH (ref 4.0–10.5)

## 2016-06-17 LAB — GLUCOSE, CAPILLARY
GLUCOSE-CAPILLARY: 142 mg/dL — AB (ref 65–99)
GLUCOSE-CAPILLARY: 142 mg/dL — AB (ref 65–99)
GLUCOSE-CAPILLARY: 143 mg/dL — AB (ref 65–99)
GLUCOSE-CAPILLARY: 158 mg/dL — AB (ref 65–99)
Glucose-Capillary: 129 mg/dL — ABNORMAL HIGH (ref 65–99)

## 2016-06-17 LAB — BASIC METABOLIC PANEL
Anion gap: 9 (ref 5–15)
BUN: 18 mg/dL (ref 6–20)
CALCIUM: 8.9 mg/dL (ref 8.9–10.3)
CHLORIDE: 101 mmol/L (ref 101–111)
CO2: 29 mmol/L (ref 22–32)
CREATININE: 1.02 mg/dL — AB (ref 0.44–1.00)
GFR calc Af Amer: >60 mL/min (ref >60)
GFR calc non Af Amer: 56 mL/min — ABNORMAL LOW (ref >60)
Glucose, Bld: 161 mg/dL — ABNORMAL HIGH (ref 65–99)
Potassium: 3.5 mmol/L (ref 3.5–5.1)
SODIUM: 139 mmol/L (ref 135–145)

## 2016-06-17 LAB — MAGNESIUM: MAGNESIUM: 1.8 mg/dL (ref 1.7–2.4)

## 2016-06-17 MED ORDER — HYDROMORPHONE HCL 2 MG/ML IJ SOLN
1.0000 mg | INTRAMUSCULAR | Status: DC | PRN
  Administered 2016-06-18 – 2016-06-19 (×4): 1 mg via INTRAVENOUS
  Filled 2016-06-17 (×5): qty 1

## 2016-06-17 MED ORDER — INSULIN ASPART 100 UNIT/ML ~~LOC~~ SOLN
0.0000 [IU] | SUBCUTANEOUS | Status: DC
  Administered 2016-06-17: 3 [IU] via SUBCUTANEOUS
  Administered 2016-06-17 – 2016-06-18 (×2): 2 [IU] via SUBCUTANEOUS
  Administered 2016-06-18: 3 [IU] via SUBCUTANEOUS
  Administered 2016-06-18 – 2016-06-19 (×4): 2 [IU] via SUBCUTANEOUS
  Administered 2016-06-19 (×2): 3 [IU] via SUBCUTANEOUS
  Administered 2016-06-20: 2 [IU] via SUBCUTANEOUS
  Administered 2016-06-20: 3 [IU] via SUBCUTANEOUS
  Administered 2016-06-20: 2 [IU] via SUBCUTANEOUS
  Administered 2016-06-20 (×2): 3 [IU] via SUBCUTANEOUS
  Administered 2016-06-21 (×5): 2 [IU] via SUBCUTANEOUS
  Administered 2016-06-22: 3 [IU] via SUBCUTANEOUS
  Administered 2016-06-22 – 2016-06-27 (×22): 2 [IU] via SUBCUTANEOUS

## 2016-06-17 MED ORDER — ALBUTEROL SULFATE (2.5 MG/3ML) 0.083% IN NEBU
2.5000 mg | INHALATION_SOLUTION | Freq: Two times a day (BID) | RESPIRATORY_TRACT | Status: DC
  Administered 2016-06-18 (×2): 2.5 mg via RESPIRATORY_TRACT
  Filled 2016-06-17 (×2): qty 3

## 2016-06-17 MED ORDER — MAGNESIUM SULFATE 2 GM/50ML IV SOLN
2.0000 g | Freq: Once | INTRAVENOUS | Status: AC
  Administered 2016-06-17: 2 g via INTRAVENOUS
  Filled 2016-06-17: qty 50

## 2016-06-17 MED ORDER — LACTATED RINGERS IV SOLN
INTRAVENOUS | Status: DC
  Administered 2016-06-17 – 2016-06-19 (×5): via INTRAVENOUS

## 2016-06-17 MED ORDER — OXYCODONE HCL 5 MG PO TABS
5.0000 mg | ORAL_TABLET | ORAL | Status: DC | PRN
  Administered 2016-06-17 – 2016-06-20 (×7): 5 mg via ORAL
  Filled 2016-06-17 (×8): qty 1

## 2016-06-17 MED ORDER — SODIUM CHLORIDE 0.9 % IV SOLN
30.0000 meq | Freq: Once | INTRAVENOUS | Status: AC
  Administered 2016-06-17: 30 meq via INTRAVENOUS
  Filled 2016-06-17: qty 15

## 2016-06-17 NOTE — Progress Notes (Signed)
S: No acute events. Pain well controlled. Still has nausea when she tries to get up. No further emesis. No BM or flatus. Has not yet walked outside her room.   Vitals, labs, intake/output, and orders reviewed at this time. Afebrile, Sats 99-100 on [place], normotensive, HR 89-107. WBC up slightly , Hypokalemia 3.5, mag 1.8, Cr 1.02, PO intake 960 yesterday, UOP 1150 yesterday.   Gen: A&Ox3, no distress  H&N: EOMI, atraumatic, neck supple Chest: unlabored respirations, RRR Abd: soft, appropriately tender, obese, minimally distended, incision c/d/i under honeycomb dressing Ext: warm, no edema Neuro: grossly normal  Lines/tubes/drains: PIV  A/P:  POD 2 s/p open ventral hernia repair, still having some nausea -out of bed/ambulate, continue abdominal binder -Continue to emphasize pulm toilet and wean off o2 -Back down to sips only until nausea better, added reglan yest. Increase IVF to 100, change to LR due to hyperglycemia -Minimize narcotics as able -Hypokalemia- Replace K and Mg    Rhonda Blakeshelsea Wanda Cellucci, MD [facility], [place] Pager [phone]

## 2016-06-18 ENCOUNTER — Inpatient Hospital Stay (HOSPITAL_COMMUNITY): Payer: Medicare Other

## 2016-06-18 LAB — GLUCOSE, CAPILLARY
GLUCOSE-CAPILLARY: 115 mg/dL — AB (ref 65–99)
GLUCOSE-CAPILLARY: 118 mg/dL — AB (ref 65–99)
GLUCOSE-CAPILLARY: 136 mg/dL — AB (ref 65–99)
GLUCOSE-CAPILLARY: 140 mg/dL — AB (ref 65–99)
Glucose-Capillary: 136 mg/dL — ABNORMAL HIGH (ref 65–99)
Glucose-Capillary: 154 mg/dL — ABNORMAL HIGH (ref 65–99)

## 2016-06-18 LAB — BASIC METABOLIC PANEL
ANION GAP: 9 (ref 5–15)
BUN: 19 mg/dL (ref 6–20)
CALCIUM: 9.1 mg/dL (ref 8.9–10.3)
CO2: 33 mmol/L — AB (ref 22–32)
Chloride: 99 mmol/L — ABNORMAL LOW (ref 101–111)
Creatinine, Ser: 0.73 mg/dL (ref 0.44–1.00)
GFR calc non Af Amer: >60 mL/min (ref >60)
GLUCOSE: 144 mg/dL — AB (ref 65–99)
POTASSIUM: 3.3 mmol/L — AB (ref 3.5–5.1)
Sodium: 141 mmol/L (ref 135–145)

## 2016-06-18 MED ORDER — PROMETHAZINE HCL 25 MG/ML IJ SOLN
12.5000 mg | Freq: Four times a day (QID) | INTRAMUSCULAR | Status: DC | PRN
  Administered 2016-06-18 – 2016-06-27 (×6): 12.5 mg via INTRAVENOUS
  Filled 2016-06-18 (×6): qty 1

## 2016-06-18 MED ORDER — SODIUM CHLORIDE 0.9 % IV SOLN
30.0000 meq | Freq: Once | INTRAVENOUS | Status: AC
  Administered 2016-06-18: 30 meq via INTRAVENOUS
  Filled 2016-06-18: qty 15

## 2016-06-18 NOTE — Progress Notes (Signed)
Called Respiratory for a PRN resp treatment at pt request.

## 2016-06-18 NOTE — Progress Notes (Signed)
3 Days Post-Op  Subjective: Intermittent nausea, emesis X 1 overnight  Objective: Vital signs in last 24 hours: Temp:  [98.2 F (36.8 C)-98.6 F (37 C)] 98.3 F (36.8 C) (02/17 0411) Pulse Rate:  [92-109] 98 (02/17 0411) Resp:  [18-19] 18 (02/17 0411) BP: (130-158)/(67-69) 137/68 (02/17 0411) SpO2:  [97 %-100 %] 98 % (02/17 0725) Last BM Date: 06/15/16  Intake/Output from previous day: 02/16 0701 - 02/17 0700 In: 1545 [P.O.:360; I.V.:1185] Out: 1250 [Urine:1250] Intake/Output this shift: Total I/O In: -  Out: 150 [Urine:150]  General appearance: alert and cooperative Resp: clear to auscultation bilaterally Cardio: regular rate and rhythm GI: mild dist, quiet, dry stain on dressing, not sig tender  Lab Results:   Recent Labs  06/16/16 0252 06/17/16 0537  WBC 10.9* 12.7*  HGB 11.6* 10.6*  HCT 35.7* 33.4*  PLT 321 261   BMET  Recent Labs  06/17/16 0537 06/18/16 0611  NA 139 141  K 3.5 3.3*  CL 101 99*  CO2 29 33*  GLUCOSE 161* 144*  BUN 18 19  CREATININE 1.02* 0.73  CALCIUM 8.9 9.1   PT/INR No results for input(s): LABPROT, INR in the last 72 hours. ABG No results for input(s): PHART, HCO3 in the last 72 hours.  Invalid input(s): PCO2, PO2  Studies/Results: No results found.  Anti-infectives: Anti-infectives    Start     Dose/Rate Route Frequency Ordered Stop   06/15/16 0800  ceFAZolin (ANCEF) 3 g in dextrose 5 % 50 mL IVPB     3 g 130 mL/hr over 30 Minutes Intravenous On call to O.R. 06/14/16 1022 06/15/16 0840      Assessment/Plan: s/p Procedure(s): OPEN VENTRAL HERNIA REPAIR (N/A) INSERTION OF ETHICON ULTRAPRO MESH (N/A) POD#3 Suspect ileus - sips, check abd x-rays FEN - continue IVF, add phenergan PRN, replete hypokelemia DM - SSI VTE - Lovenox I spoke with her family  LOS: 3 days    Dylon Correa E 06/18/2016

## 2016-06-19 LAB — BASIC METABOLIC PANEL
ANION GAP: 9 (ref 5–15)
BUN: 17 mg/dL (ref 6–20)
CO2: 32 mmol/L (ref 22–32)
Calcium: 9 mg/dL (ref 8.9–10.3)
Chloride: 97 mmol/L — ABNORMAL LOW (ref 101–111)
Creatinine, Ser: 0.67 mg/dL (ref 0.44–1.00)
GLUCOSE: 136 mg/dL — AB (ref 65–99)
POTASSIUM: 3.3 mmol/L — AB (ref 3.5–5.1)
SODIUM: 138 mmol/L (ref 135–145)

## 2016-06-19 LAB — CBC
HEMATOCRIT: 32.4 % — AB (ref 36.0–46.0)
HEMOGLOBIN: 10.4 g/dL — AB (ref 12.0–15.0)
MCH: 27.8 pg (ref 26.0–34.0)
MCHC: 32.1 g/dL (ref 30.0–36.0)
MCV: 86.6 fL (ref 78.0–100.0)
Platelets: 303 10*3/uL (ref 150–400)
RBC: 3.74 MIL/uL — AB (ref 3.87–5.11)
RDW: 15 % (ref 11.5–15.5)
WBC: 11.9 10*3/uL — ABNORMAL HIGH (ref 4.0–10.5)

## 2016-06-19 LAB — GLUCOSE, CAPILLARY
GLUCOSE-CAPILLARY: 129 mg/dL — AB (ref 65–99)
GLUCOSE-CAPILLARY: 137 mg/dL — AB (ref 65–99)
GLUCOSE-CAPILLARY: 125 mg/dL — AB (ref 65–99)
GLUCOSE-CAPILLARY: 162 mg/dL — AB (ref 65–99)
GLUCOSE-CAPILLARY: 153 mg/dL — AB (ref 65–99)

## 2016-06-19 MED ORDER — LEVOCETIRIZINE DIHYDROCHLORIDE 5 MG PO TABS: 5.0000 mg | ORAL_TABLET | Freq: Every evening | ORAL | Status: DC

## 2016-06-19 MED ORDER — METOCLOPRAMIDE HCL 5 MG/ML IJ SOLN
10.0000 mg | Freq: Four times a day (QID) | INTRAMUSCULAR | Status: DC
  Administered 2016-06-19 – 2016-06-20 (×4): 10 mg via INTRAVENOUS
  Filled 2016-06-19 (×4): qty 2

## 2016-06-19 MED ORDER — LORATADINE 10 MG PO TABS
10.0000 mg | ORAL_TABLET | Freq: Every day | ORAL | Status: DC
  Administered 2016-06-19 – 2016-06-27 (×5): 10 mg via ORAL
  Filled 2016-06-19 (×10): qty 1

## 2016-06-19 MED ORDER — PANTOPRAZOLE SODIUM 40 MG PO TBEC
40.0000 mg | DELAYED_RELEASE_TABLET | Freq: Every day | ORAL | Status: DC
  Administered 2016-06-19: 40 mg via ORAL
  Filled 2016-06-19 (×2): qty 1

## 2016-06-19 MED ORDER — SODIUM CHLORIDE 0.9 % IV SOLN
30.0000 meq | Freq: Once | INTRAVENOUS | Status: AC
  Administered 2016-06-19: 30 meq via INTRAVENOUS
  Filled 2016-06-19: qty 15

## 2016-06-19 MED ORDER — POTASSIUM CHLORIDE 2 MEQ/ML IV SOLN
INTRAVENOUS | Status: DC
  Administered 2016-06-19 – 2016-06-21 (×5): via INTRAVENOUS
  Filled 2016-06-19 (×9): qty 1000

## 2016-06-19 NOTE — Evaluation (Signed)
Physical Therapy Evaluation Patient Details Name: Rhonda [name] MRN: [id number] DOB: [birth year] Today's Date: 06/19/2016   History of Present Illness  The patient is a 66 year old female who presents with an abdominal wall hernia. Now s/p hernia repair; recovery complicated by ileus;  has a past medical history of Anemia; Arthritis; Complication of anesthesia;  Hyperlipemia; Hypertension;  has a past surgical history that includes  Posterior lumbar fusion (2012)  Clinical Impression   Pt admitted with above diagnosis. Pt currently with functional limitations due to the deficits listed below (see PT Problem List).  Pt will benefit from skilled PT to increase their independence and safety with mobility to allow discharge to the venue listed below.       Follow Up Recommendations Home health PT;Supervision/Assistance - 24 hour    Equipment Recommendations  Rolling walker with 5" wheels;3in1 (PT)    Recommendations for Other Services OT consult     Precautions / Restrictions Precautions Precautions: Other (comment) Precaution Comments: Log roll for comfort with bed mobility      Mobility  Bed Mobility Overal bed mobility: Needs Assistance Bed Mobility: Sit to Sidelying         Sit to sidelying: Mod assist General bed mobility comments: Mod assist to get feet into bed  Transfers Overall transfer level: Needs assistance Equipment used: Rolling walker (2 wheeled) Transfers: Sit to/from Stand Sit to Stand: Min guard         General transfer comment: Good rise; needs reinforcement for hand placement  Ambulation/Gait Ambulation/Gait assistance: Min guard Ambulation Distance (Feet): 30 Feet Assistive device: Rolling walker (2 wheeled) Gait Pattern/deviations: Step-through pattern;Decreased step length - right;Decreased step length - left;Decreased stride length     General Gait Details: Cues to self-monitor for activity tolerance  Stairs             Wheelchair Mobility    Modified Rankin (Stroke Patients Only)       Balance Overall balance assessment: Needs assistance Sitting-balance support: Feet supported Sitting balance-Leahy Scale: Fair       Standing balance-Leahy Scale: Poor                               Pertinent Vitals/Pain Pain Assessment: 0-10 Pain Score: 6  Pain Location: abdomen Pain Descriptors / Indicators: Aching;Discomfort;Grimacing;Guarding Pain Intervention(s): Monitored during session    Home Living Family/patient expects to be discharged to:: Private residence Living Arrangements: Parent Available Help at Discharge: Family;Available PRN/intermittently Type of Home: House Home Access: Stairs to enter Entrance Stairs-Rails: Right Entrance Stairs-Number of Steps: 4 Home Layout: Two level;Able to live on main level with bedroom/bathroom Home Equipment: Dan Humphreys - 2 wheels      Prior Function Level of Independence: Independent               Hand Dominance        Extremity/Trunk Assessment   Upper Extremity Assessment Upper Extremity Assessment: Generalized weakness    Lower Extremity Assessment Lower Extremity Assessment: Generalized weakness       Communication   Communication: No difficulties  Cognition Arousal/Alertness: Awake/alert Behavior During Therapy: WFL for tasks assessed/performed Overall Cognitive Status: Within Functional Limits for tasks assessed                      General Comments      Exercises     Assessment/Plan    PT Assessment Patient needs continued PT services  PT Problem List Decreased strength;Decreased range of motion;Decreased activity tolerance;Decreased balance;Decreased mobility;Decreased knowledge of use of DME;Decreased knowledge of precautions;Pain          PT Treatment Interventions DME instruction;Gait training;Stair training;Functional mobility training;Therapeutic activities;Patient/family education     PT Goals (Current goals can be found in the Care Plan section)  Acute Rehab PT Goals Patient Stated Goal: get better PT Goal Formulation: With patient Time For Goal Achievement: 07/03/16 Potential to Achieve Goals: Good    Frequency Min 3X/week   Barriers to discharge Decreased caregiver support Must be at or close to modified independent to dc home    Co-evaluation               End of Session   Activity Tolerance: Patient tolerated treatment well Patient left: in bed;with call bell/phone within reach;with family/visitor present Nurse Communication: Mobility status         Time: 1651-1710 PT Time Calculation (min) (ACUTE ONLY): 19 min   Charges:   PT Evaluation $PT Eval Low Complexity: 1 Procedure     PT G Codes:        Levi Aland 06/19/2016, 6:00 PM  Van Clines, [place]  Acute Rehabilitation Services Pager [phone] Office [phone]

## 2016-06-19 NOTE — Progress Notes (Addendum)
4 Days Post-Op  Subjective: Still nauseated but no emesis for over 24 hours Passing a little flatus but no stool Getting up in chair, otherwise slow to mobilize pain control adequate  Potassium 3.3.  Creatinine 0.67.  CBC 11,900.  Hemoglobin 10.4.  Abdominal x-rays yesterday consistent with ileus.    Objective: Vital signs in last 24 hours: Temp:  [98.3 F (36.8 C)-99.7 F (37.6 C)] 98.8 F (37.1 C) (02/18 0418) Pulse Rate:  [102-125] 108 (02/18 0418) Resp:  [18-19] 19 (02/18 0418) BP: (138-159)/(77-82) 138/78 (02/18 0418) SpO2:  [94 %-96 %] 96 % (02/18 0418) Last BM Date: 06/15/16  Intake/Output from previous day: 02/17 0701 - 02/18 0700 In: 1443.3 [P.O.:420; I.V.:1023.3] Out: 2300 [Urine:2300] Intake/Output this shift: No intake/output data recorded.  General appearance: Morbidly obese.  Pleasant.  No acute distress.  Family in room.  Mental status normal. Resp: clear to auscultation bilaterally GI: Abdomen obese.  Soft.  Rare bowel sounds.  Wound clean without infection.   Lab Results:  Results for orders placed or performed during the hospital encounter of 06/15/16 (from the past 24 hour(s))  Glucose, capillary     Status: Abnormal   Collection Time: 06/18/16 11:39 AM  Result Value Ref Range   Glucose-Capillary 115 (H) 65 - 99 mg/dL  Glucose, capillary     Status: Abnormal   Collection Time: 06/18/16  5:22 PM  Result Value Ref Range   Glucose-Capillary 118 (H) 65 - 99 mg/dL  Glucose, capillary     Status: Abnormal   Collection Time: 06/18/16  8:08 PM  Result Value Ref Range   Glucose-Capillary 154 (H) 65 - 99 mg/dL  Glucose, capillary     Status: Abnormal   Collection Time: 06/19/16 12:03 AM  Result Value Ref Range   Glucose-Capillary 140 (H) 65 - 99 mg/dL  Glucose, capillary     Status: Abnormal   Collection Time: 06/19/16  4:16 AM  Result Value Ref Range   Glucose-Capillary 129 (H) 65 - 99 mg/dL  Basic metabolic panel     Status: Abnormal   Collection  Time: 06/19/16  4:39 AM  Result Value Ref Range   Sodium 138 135 - 145 mmol/L   Potassium 3.3 (L) 3.5 - 5.1 mmol/L   Chloride 97 (L) 101 - 111 mmol/L   CO2 32 22 - 32 mmol/L   Glucose, Bld 136 (H) 65 - 99 mg/dL   BUN 17 6 - 20 mg/dL   Creatinine, Ser 0.67 0.44 - 1.00 mg/dL   Calcium 9.0 8.9 - 10.3 mg/dL   GFR calc non Af Amer >60 >60 mL/min   GFR calc Af Amer >60 >60 mL/min   Anion gap 9 5 - 15  CBC     Status: Abnormal   Collection Time: 06/19/16  4:39 AM  Result Value Ref Range   WBC 11.9 (H) 4.0 - 10.5 K/uL   RBC 3.74 (L) 3.87 - 5.11 MIL/uL   Hemoglobin 10.4 (L) 12.0 - 15.0 g/dL   HCT 32.4 (L) 36.0 - 46.0 %   MCV 86.6 78.0 - 100.0 fL   MCH 27.8 26.0 - 34.0 pg   MCHC 32.1 30.0 - 36.0 g/dL   RDW 15.0 11.5 - 15.5 %   Platelets 303 150 - 400 K/uL  Glucose, capillary     Status: Abnormal   Collection Time: 06/19/16  7:58 AM  Result Value Ref Range   Glucose-Capillary 137 (H) 65 - 99 mg/dL     Studies/Results: Dg Abd 2  Views  Result Date: 06/18/2016 CLINICAL DATA:  Ileus following gastrointestinal surgery EXAM: ABDOMEN - 2 VIEW COMPARISON:  CT abdomen pelvis 05/13/2016 FINDINGS: Normal heart size and mediastinal contours. Bibasilar atelectasis and small pleural effusions. No gross infiltrate. Gas distention of numerous small bowel loops throughout abdomen. Some colonic gas is present. Findings favor postoperative ileus. No definite bowel wall thickening or free air. Scattered degenerative changes thoracolumbar spine with L5-S1 fusion. IMPRESSION: Probable postoperative ileus. Bibasilar pleural effusions and atelectasis. Electronically Signed   By: Lavonia Dana M.D.   On: 06/18/2016 13:41    . docusate sodium  100 mg Oral BID  . dorzolamide-timolol  1 drop Both Eyes BID  . enoxaparin (LOVENOX) injection  40 mg Subcutaneous Q24H  . ezetimibe  10 mg Oral QPM  . feeding supplement  1 Container Oral TID BM  . irbesartan  300 mg Oral Daily   And  . hydrochlorothiazide  25 mg Oral  Daily  . insulin aspart  0-15 Units Subcutaneous Q4H  . mouth rinse  15 mL Mouth Rinse BID  . metoCLOPramide (REGLAN) injection  5 mg Intravenous Q6H  . montelukast  10 mg Oral QHS  . multivitamin with minerals  1 tablet Oral Daily  . potassium chloride (KCL MULTIRUN) 30 mEq in 265 mL IVPB  30 mEq Intravenous Once  . senna  1 tablet Oral BID     Assessment/Plan: s/p Procedure(s): OPEN VENTRAL HERNIA REPAIR INSERTION OF ETHICON ULTRAPRO MESH   POD#4 Suspect ileus - CL as tol. Hypokalemia-IVs adjusted.  Supplement ordered.  Check b-met tomorrow FEN - continue IVF, add phenergan PRN, replete hypokelemia DM - SSI VTE - Lovenox I spoke with her family  LOS: 3 days   '@PROBHOSP' @  LOS: 4 days    Marcayla Budge M 06/19/2016  . .prob

## 2016-06-20 ENCOUNTER — Encounter (HOSPITAL_COMMUNITY): Payer: Self-pay | Admitting: *Deleted

## 2016-06-20 ENCOUNTER — Inpatient Hospital Stay (HOSPITAL_COMMUNITY): Payer: Medicare Other

## 2016-06-20 ENCOUNTER — Other Ambulatory Visit: Payer: Self-pay

## 2016-06-20 LAB — GLUCOSE, CAPILLARY
GLUCOSE-CAPILLARY: 108 mg/dL — AB (ref 65–99)
GLUCOSE-CAPILLARY: 125 mg/dL — AB (ref 65–99)
GLUCOSE-CAPILLARY: 173 mg/dL — AB (ref 65–99)
Glucose-Capillary: 118 mg/dL — ABNORMAL HIGH (ref 65–99)
Glucose-Capillary: 145 mg/dL — ABNORMAL HIGH (ref 65–99)
Glucose-Capillary: 160 mg/dL — ABNORMAL HIGH (ref 65–99)
Glucose-Capillary: 161 mg/dL — ABNORMAL HIGH (ref 65–99)

## 2016-06-20 LAB — CBC
HCT: 39 % (ref 36.0–46.0)
Hemoglobin: 13 g/dL (ref 12.0–15.0)
MCH: 29.1 pg (ref 26.0–34.0)
MCHC: 33.3 g/dL (ref 30.0–36.0)
MCV: 87.2 fL (ref 78.0–100.0)
Platelets: 429 10*3/uL — ABNORMAL HIGH (ref 150–400)
RBC: 4.47 MIL/uL (ref 3.87–5.11)
RDW: 14.9 % (ref 11.5–15.5)
WBC: 14.5 10*3/uL — ABNORMAL HIGH (ref 4.0–10.5)

## 2016-06-20 LAB — BASIC METABOLIC PANEL
ANION GAP: 13 (ref 5–15)
BUN: 24 mg/dL — ABNORMAL HIGH (ref 6–20)
CHLORIDE: 94 mmol/L — AB (ref 101–111)
CO2: 37 mmol/L — AB (ref 22–32)
Calcium: 10.2 mg/dL (ref 8.9–10.3)
Creatinine, Ser: 0.86 mg/dL (ref 0.44–1.00)
GFR calc Af Amer: >60 mL/min (ref >60)
GLUCOSE: 166 mg/dL — AB (ref 65–99)
POTASSIUM: 4 mmol/L (ref 3.5–5.1)
Sodium: 144 mmol/L (ref 135–145)

## 2016-06-20 LAB — POCT I-STAT 3, ART BLOOD GAS (G3+)
Acid-Base Excess: 13 mmol/L — ABNORMAL HIGH (ref 0.0–2.0)
BICARBONATE: 38.3 mmol/L — AB (ref 20.0–28.0)
O2 Saturation: 86 %
PCO2 ART: 47.7 mmHg (ref 32.0–48.0)
Patient temperature: 97.4
TCO2: 40 mmol/L (ref 0–100)
pH, Arterial: 7.51 — ABNORMAL HIGH (ref 7.350–7.450)
pO2, Arterial: 46 mmHg — ABNORMAL LOW (ref 83.0–108.0)

## 2016-06-20 LAB — HEPARIN LEVEL (UNFRACTIONATED): Heparin Unfractionated: 0.27 IU/mL — ABNORMAL LOW (ref 0.30–0.70)

## 2016-06-20 LAB — MRSA PCR SCREENING: MRSA BY PCR: NEGATIVE

## 2016-06-20 LAB — TROPONIN I
TROPONIN I: 0.05 ng/mL — AB (ref <0.03)
TROPONIN I: 0.07 ng/mL — AB (ref <0.03)
Troponin I: 0.03 ng/mL (ref <0.03)

## 2016-06-20 LAB — LACTIC ACID, PLASMA
LACTIC ACID, VENOUS: 3 mmol/L — AB (ref 0.5–1.9)
Lactic Acid, Venous: 2 mmol/L (ref 0.5–1.9)

## 2016-06-20 MED ORDER — ORAL CARE MOUTH RINSE
15.0000 mL | Freq: Two times a day (BID) | OROMUCOSAL | Status: DC
  Administered 2016-06-21 – 2016-06-27 (×13): 15 mL via OROMUCOSAL

## 2016-06-20 MED ORDER — MORPHINE SULFATE (PF) 4 MG/ML IV SOLN
4.0000 mg | Freq: Once | INTRAVENOUS | Status: AC
  Administered 2016-06-20: 4 mg via INTRAVENOUS

## 2016-06-20 MED ORDER — MORPHINE SULFATE (PF) 4 MG/ML IV SOLN
INTRAVENOUS | Status: AC
  Filled 2016-06-20: qty 1

## 2016-06-20 MED ORDER — HEPARIN (PORCINE) IN NACL 100-0.45 UNIT/ML-% IJ SOLN
1450.0000 [IU]/h | INTRAMUSCULAR | Status: DC
  Administered 2016-06-20: 1450 [IU]/h via INTRAVENOUS
  Filled 2016-06-20 (×2): qty 250

## 2016-06-20 MED ORDER — IOPAMIDOL (ISOVUE-370) INJECTION 76%
INTRAVENOUS | Status: AC
  Administered 2016-06-20: 100 mL
  Filled 2016-06-20: qty 100

## 2016-06-20 MED ORDER — HYDROMORPHONE HCL 1 MG/ML IJ SOLN
1.0000 mg | INTRAMUSCULAR | Status: DC | PRN
  Administered 2016-06-20: 1 mg via INTRAVENOUS
  Filled 2016-06-20: qty 1

## 2016-06-20 MED ORDER — SODIUM CHLORIDE 0.9 % IV BOLUS (SEPSIS)
1000.0000 mL | Freq: Once | INTRAVENOUS | Status: AC
  Administered 2016-06-20: 1000 mL via INTRAVENOUS

## 2016-06-20 MED ORDER — HEPARIN (PORCINE) IN NACL 100-0.45 UNIT/ML-% IJ SOLN
1500.0000 [IU]/h | INTRAMUSCULAR | Status: DC
  Administered 2016-06-21: 1500 [IU]/h via INTRAVENOUS
  Administered 2016-06-21: 1600 [IU]/h via INTRAVENOUS
  Filled 2016-06-20 (×6): qty 250

## 2016-06-20 MED ORDER — CHLORHEXIDINE GLUCONATE 0.12 % MT SOLN
15.0000 mL | Freq: Two times a day (BID) | OROMUCOSAL | Status: DC
  Administered 2016-06-20 – 2016-06-28 (×11): 15 mL via OROMUCOSAL
  Filled 2016-06-20 (×9): qty 15

## 2016-06-20 MED ORDER — HEPARIN BOLUS VIA INFUSION
2500.0000 [IU] | Freq: Once | INTRAVENOUS | Status: AC
  Administered 2016-06-20: 2500 [IU] via INTRAVENOUS
  Filled 2016-06-20: qty 2500

## 2016-06-20 NOTE — Progress Notes (Signed)
CRITICAL VALUE ALERT  Critical value received:  Troponin 0.07, Lactic Acid 2.0  Date of notification:  06/20/2016  Time of notification:  1920  Critical value read back: Yes  Nurse who received alert:  Lennox Grumbleson Hovander RN  MD notified (1st page):  Phylliss Blakeshelsea Connor  Time of first page:  1925  MD notified (2nd page):  Time of second page:  Responding MD:  Phylliss Blakeshelsea Connor  Time MD responded:  [phone]

## 2016-06-20 NOTE — Significant Event (Signed)
Rapid Response Event Note  Overview: Time Called: 0931 Arrival Time: 0837 Event Type: Respiratory, Cardiac  Initial Focused Assessment: Patient with SOB and nausea over night,  Cool and diaphoretic this morning. HR 130s.  With Am assessment Rn noted O2 sats in the 60s on [place]  Increased o2 to 4L [place] O2 sats 100% Patient feels ill this morning, sweaty, SOB, nauseated and cold.  She also endorsed pain/soreness in her back and abdomen Lung sounds decreased bases, heart tones rapid. No bowel sounds,  Patient states she has passed gas but no BM since surgery. Jessica PA at   Interventions: PCXR done 12 lead EKG done Awaiting labs Dr Lindie SpruceWyatt at bedside to assess patient:  Orders received to transfer to ICU Critical result:  Troponin 0.05 Patient transported to radiology for CTA chest and xray of abdomen then transferred to 2M11    Plan of Care (if not transferred):  Event Summary: Name of Physician Notified: surgical PA at bedside at      at    Outcome: Transferred (Comment)  Event End Time: 1115  Marcellina MillinLayton, Maitri Schnoebelen

## 2016-06-20 NOTE — Progress Notes (Signed)
ANTICOAGULATION CONSULT NOTE - Initial Consult  Pharmacy Consult for heparin Indication: pulmonary embolus  Allergies  Allergen Reactions  . Aspirin-Acetaminophen-Caffeine Nausea And Vomiting  . Codeine Diarrhea    Stomach cramps     Patient Measurements: Height: 5\' 4"  (162.6 cm) Weight: 283 lb 15.2 oz (128.8 kg) IBW/kg (Calculated) : 54.7 Heparin Dosing Weight: 86 kg  Vital Signs: Temp: 97.7 F (36.5 C) (02/19 1007) Temp Source: Oral (02/19 1007) BP: 111/86 (02/19 1120) Pulse Rate: 135 (02/19 1007)  Labs:  Recent Labs  06/18/16 [id number] 06/19/16 0439 06/20/16 0345 06/20/16 0931 06/20/16 1022  HGB  --  10.4*  --   --  13.0  HCT  --  32.4*  --   --  39.0  PLT  --  303  --   --  429*  CREATININE 0.73 0.67 0.86  --   --   TROPONINI  --   --   --  0.05*  --     Estimated Creatinine Clearance: 84.5 mL/min (by C-G formula based on SCr of 0.86 mg/dL).  Assessment: 66 yo f originally presented 2/14 with ventral s/p repair - now with acute PE  Anticoag: none pta last enox 1700 2/18 Now to iv hep for acute PE  Pulm: acute PE   Heme/Onc: H&H 13/39, Plt 429  Goal of Therapy:  Heparin level 0.3-0.7 units/ml Monitor platelets by anticoagulation protocol: Yes   Plan:  Heparin bolus 2500 (1/2 bolus d/t enox yest) Heparin infusion 1450 units/hr Initial lvl 2000 Daily HL, CBC [facility]  Isaac BlissMichael Huston Stonehocker, PharmD, BCPS, BCCCP Clinical Pharmacist Clinical phone for 06/20/2016 from 7a-3:30p: [phone] If after 3:30p, please call main pharmacy at: [phone] 06/20/2016 11:52 AM

## 2016-06-20 NOTE — Progress Notes (Signed)
Pt has only voided during day shift. Bladder scan revealed <158mL of urine. Notified MD about low urinary output. New orders received. Will give 1L NS bolus. Will continue to monitor.

## 2016-06-20 NOTE — Progress Notes (Addendum)
0830 pt is sitting at the edge of the bed, RN called in by the daughter. Pt with cold sweats, c/o SOB. V/S checked, O2 sats 68% at 2li. Pt is very diaphoretic, denies chest pain. RRT notified and came in. Mattie MarlinJessica Focht PA came in and seen pt. V/S monitored, O2 sats 100 at 4l. BS 173, pt is still diaphoretic. Cardiac monitor initiated. 1000 Dr Lindie SpruceWyatt came in and seen pt. Will transfer pt to MICU (25M). 1040 Pt off the unit to CT for CTA  accompanied by RRT, report given to 25M Catrice Rn. Pt is transferred to 25M Rm 16 right after CT.

## 2016-06-20 NOTE — Progress Notes (Signed)
Vitals:   06/20/16 0817 06/20/16 0833  BP: (!) 131/103 123/75  Pulse: (!) 138 (!) 135  Resp:    Temp: 97.7 F (36.5 C)    Subjective: Pt with diaphoresis overnight, nausea, SOB with rapid onset this morning, tachycardia. Pt was sating in the 60's and is at 100% on 4L O2. Pt denies CP.  PE: General: Pt appears ill, diaphoretic Lungs: CTA bilaterally, no decreased breath sounds, no wheezes, rales or rhonchi Cardio: Tachycardic, regular rhythm, systolic murmur noted  Plan: Chest xray, troponin, and EKG pending. If all of these come back normal will obtain a CT angio to rule out PE. I spoke with Dr. Fredricka Bonineonnor who agrees with the plan.   Mattie MarlinJessica Cecil Vandyke, [facility] Pager [phone]

## 2016-06-20 NOTE — Progress Notes (Signed)
S: Patient with increasing tachycardia last 24 hours and acutely short of breath this morning. Denies chest pain. Has continued to have nausea/emesis. Minimal abdominal pain.   Vitals, labs, intake/output, and orders reviewed at this time. Tm 98.4. HR has been 120-130s this morning. Sats 99% on 4LNC, RR 21. Normotensive. CBC hemoconcentrated; BMP with increasing bicarb @37 , creatinine 0.86. UOP 850 + 1 void recorded.    Trop 0.05 AXR with ileus EKG no St elevation CTA by my read with right upper pulm embolus  Gen: A&Ox3, appears uncomfortable H&N: EOMI, atraumatic, neck supple Chest: unlabored respirations but short of breath with conversation, tachycardic Abd: soft, minimally tender, distended, incision c/d/i without sign of infection under honeycomb. Ext: warm, symmetrical no edema. Bilateral subjective calf tenderness Neuro: grossly normal  Lines/tubes/drains: PIV  A/P:  POD 5 s/p open ventral hernia repair -ileus: NPO, NGT -Pe: heparin drip, ICU close monitoring   Rhonda Blakeshelsea Sheril Hammond, MD [facility], [place] Pager [phone]

## 2016-06-20 NOTE — Progress Notes (Signed)
ANTICOAGULATION CONSULT NOTE - FOLLOW UP    HL = 0.27 (goal 0.3 - 0.7 units/mL) Heparin dosing weight = 86 kg   Assessment: 66 YOF with acute PE to continue on IV heparin.  Heparin level is slightly below goal; no bleeding nor issues reported.  Noted that he is s/p open ventral hernia repair and his troponin is elevated though EKG is negative for ST elevation.   Plan: - Increase heparin gtt to 1600 units/hr - F/U AM labs - Monitor closely for s/sx of bleeding    Robertha Staples D. Laney Potashang, PharmD, BCPS 06/20/2016, 9:28 PM

## 2016-06-21 ENCOUNTER — Inpatient Hospital Stay (HOSPITAL_COMMUNITY): Payer: Medicare Other

## 2016-06-21 DIAGNOSIS — I2699 Other pulmonary embolism without acute cor pulmonale: Secondary | ICD-10-CM

## 2016-06-21 LAB — CBC
HEMATOCRIT: 31.8 % — AB (ref 36.0–46.0)
Hemoglobin: 10.2 g/dL — ABNORMAL LOW (ref 12.0–15.0)
MCH: 28.4 pg (ref 26.0–34.0)
MCHC: 32.1 g/dL (ref 30.0–36.0)
MCV: 88.6 fL (ref 78.0–100.0)
PLATELETS: 317 10*3/uL (ref 150–400)
RBC: 3.59 MIL/uL — ABNORMAL LOW (ref 3.87–5.11)
RDW: 15.3 % (ref 11.5–15.5)
WBC: 11 10*3/uL — AB (ref 4.0–10.5)

## 2016-06-21 LAB — GLUCOSE, CAPILLARY
GLUCOSE-CAPILLARY: 126 mg/dL — AB (ref 65–99)
GLUCOSE-CAPILLARY: 131 mg/dL — AB (ref 65–99)
Glucose-Capillary: 122 mg/dL — ABNORMAL HIGH (ref 65–99)
Glucose-Capillary: 145 mg/dL — ABNORMAL HIGH (ref 65–99)
Glucose-Capillary: 148 mg/dL — ABNORMAL HIGH (ref 65–99)
Glucose-Capillary: 151 mg/dL — ABNORMAL HIGH (ref 65–99)

## 2016-06-21 LAB — BASIC METABOLIC PANEL
ANION GAP: 8 (ref 5–15)
Anion gap: 11 (ref 5–15)
BUN: 33 mg/dL — AB (ref 6–20)
BUN: 28 mg/dL — ABNORMAL HIGH (ref 6–20)
CHLORIDE: 101 mmol/L (ref 101–111)
CO2: 43 mmol/L — ABNORMAL HIGH (ref 22–32)
CO2: 38 mmol/L — ABNORMAL HIGH (ref 22–32)
Calcium: 9.7 mg/dL (ref 8.9–10.3)
Calcium: 9.2 mg/dL (ref 8.9–10.3)
Chloride: 96 mmol/L — ABNORMAL LOW (ref 101–111)
Creatinine, Ser: 1.14 mg/dL — ABNORMAL HIGH (ref 0.44–1.00)
Creatinine, Ser: 1.02 mg/dL — ABNORMAL HIGH (ref 0.44–1.00)
GFR calc Af Amer: 56 mL/min — ABNORMAL LOW (ref >60)
GFR calc Af Amer: >60 mL/min (ref >60)
GFR, EST NON AFRICAN AMERICAN: 49 mL/min — AB (ref >60)
GFR, EST NON AFRICAN AMERICAN: 56 mL/min — AB (ref >60)
Glucose, Bld: 136 mg/dL — ABNORMAL HIGH (ref 65–99)
Glucose, Bld: 155 mg/dL — ABNORMAL HIGH (ref 65–99)
POTASSIUM: 3.8 mmol/L (ref 3.5–5.1)
Potassium: 3.4 mmol/L — ABNORMAL LOW (ref 3.5–5.1)
SODIUM: 150 mmol/L — AB (ref 135–145)
Sodium: 147 mmol/L — ABNORMAL HIGH (ref 135–145)

## 2016-06-21 LAB — CBC WITH DIFFERENTIAL/PLATELET
BASOS ABS: 0 10*3/uL (ref 0.0–0.1)
Basophils Relative: 0 %
EOS ABS: 0.1 10*3/uL (ref 0.0–0.7)
EOS PCT: 1 %
HCT: 32.5 % — ABNORMAL LOW (ref 36.0–46.0)
Hemoglobin: 10.5 g/dL — ABNORMAL LOW (ref 12.0–15.0)
LYMPHS ABS: 3 10*3/uL (ref 0.7–4.0)
Lymphocytes Relative: 25 %
MCH: 28.2 pg (ref 26.0–34.0)
MCHC: 32.3 g/dL (ref 30.0–36.0)
MCV: 87.4 fL (ref 78.0–100.0)
Monocytes Absolute: 1.3 10*3/uL — ABNORMAL HIGH (ref 0.1–1.0)
Monocytes Relative: 11 %
Neutro Abs: 7.7 10*3/uL (ref 1.7–7.7)
Neutrophils Relative %: 63 %
PLATELETS: 359 10*3/uL (ref 150–400)
RBC: 3.72 MIL/uL — AB (ref 3.87–5.11)
RDW: 15.1 % (ref 11.5–15.5)
WBC: 12 10*3/uL — AB (ref 4.0–10.5)

## 2016-06-21 LAB — HEPARIN LEVEL (UNFRACTIONATED)
HEPARIN UNFRACTIONATED: 0.63 [IU]/mL (ref 0.30–0.70)
HEPARIN UNFRACTIONATED: 0.84 [IU]/mL — AB (ref 0.30–0.70)
Heparin Unfractionated: 0.63 IU/mL (ref 0.30–0.70)

## 2016-06-21 LAB — POCT I-STAT 3, ART BLOOD GAS (G3+)
ACID-BASE EXCESS: 21 mmol/L — AB (ref 0.0–2.0)
Bicarbonate: 45.7 mmol/L — ABNORMAL HIGH (ref 20.0–28.0)
O2 SAT: 94 %
PCO2 ART: 51.3 mmHg — AB (ref 32.0–48.0)
PO2 ART: 62 mmHg — AB (ref 83.0–108.0)
Patient temperature: 98.6
TCO2: 47 mmol/L (ref 0–100)
pH, Arterial: 7.558 — ABNORMAL HIGH (ref 7.350–7.450)

## 2016-06-21 LAB — MAGNESIUM: MAGNESIUM: 2.2 mg/dL (ref 1.7–2.4)

## 2016-06-21 MED ORDER — DIATRIZOATE MEGLUMINE & SODIUM 66-10 % PO SOLN
90.0000 mL | Freq: Once | ORAL | Status: AC
  Administered 2016-06-21: 90 mL via NASOGASTRIC
  Filled 2016-06-21: qty 90

## 2016-06-21 MED ORDER — HYDRALAZINE HCL 20 MG/ML IJ SOLN: 10.0000 mg | INTRAMUSCULAR | Status: DC | PRN

## 2016-06-21 MED ORDER — METOCLOPRAMIDE HCL 5 MG/ML IJ SOLN
10.0000 mg | Freq: Four times a day (QID) | INTRAMUSCULAR | Status: DC
  Administered 2016-06-21 – 2016-06-28 (×29): 10 mg via INTRAVENOUS
  Filled 2016-06-21 (×30): qty 2

## 2016-06-21 MED ORDER — SODIUM CHLORIDE 0.9 % IV SOLN
30.0000 meq | Freq: Once | INTRAVENOUS | Status: AC
  Administered 2016-06-21: 30 meq via INTRAVENOUS
  Filled 2016-06-21: qty 15

## 2016-06-21 MED ORDER — FENTANYL CITRATE (PF) 100 MCG/2ML IJ SOLN
25.0000 ug | INTRAMUSCULAR | Status: DC | PRN
  Administered 2016-06-26: 25 ug via INTRAVENOUS
  Filled 2016-06-21: qty 2

## 2016-06-21 MED ORDER — HEPARIN (PORCINE) IN NACL 100-0.45 UNIT/ML-% IJ SOLN
1550.0000 [IU]/h | INTRAMUSCULAR | Status: AC
  Administered 2016-06-22 – 2016-06-23 (×3): 1550 [IU]/h via INTRAVENOUS
  Filled 2016-06-21 (×6): qty 250

## 2016-06-21 MED ORDER — PANTOPRAZOLE SODIUM 40 MG IV SOLR
40.0000 mg | INTRAVENOUS | Status: DC
  Administered 2016-06-21 – 2016-06-26 (×6): 40 mg via INTRAVENOUS
  Filled 2016-06-21 (×7): qty 40

## 2016-06-21 MED ORDER — KCL IN DEXTROSE-NACL 40-5-0.45 MEQ/L-%-% IV SOLN
INTRAVENOUS | Status: DC
  Administered 2016-06-21: 125 mL/h via INTRAVENOUS
  Administered 2016-06-21 – 2016-06-23 (×3): via INTRAVENOUS
  Administered 2016-06-23: 125 mL/h via INTRAVENOUS
  Administered 2016-06-23: 03:00:00 via INTRAVENOUS
  Administered 2016-06-24: 1000 mL via INTRAVENOUS
  Administered 2016-06-24 (×2): via INTRAVENOUS
  Administered 2016-06-25 (×2): 1000 mL via INTRAVENOUS
  Administered 2016-06-25: 12:00:00 via INTRAVENOUS
  Administered 2016-06-26: 1000 mL via INTRAVENOUS
  Filled 2016-06-21 (×21): qty 1000

## 2016-06-21 NOTE — Progress Notes (Signed)
At 0230: Came to pt room and found that pt had pulled IV, NG tube. Pt confused, disoriented x4. Previously pt was alert and oriented x4. Notified MD about change in mentation. New orders given. Called and updated daughter. Will continue to monitor.  Update: Pt now alert and oriented x4. Pt not aware of what had happened. Family at bedside. Family stated she has had some fluctuations in mentation during the night, however not to that extent. Will continue to monitor.

## 2016-06-21 NOTE — Progress Notes (Signed)
6 Days Post-Op  Subjective: Pt with some confusion overnight and pulled out her NGT.  Replaced.  She states she feels better this AM. No BM but has been passing flatus  Objective: Vital signs in last 24 hours: Temp:  [97.3 F (36.3 C)-99.1 F (37.3 C)] 99.1 F (37.3 C) (02/20 0809) Pulse Rate:  [88-138] 110 (02/20 0600) Resp:  [12-36] 15 (02/20 0600) BP: (99-162)/(34-122) 126/68 (02/20 0600) SpO2:  [68 %-100 %] 99 % (02/20 0600) Weight:  [126.9 kg (279 lb 12.2 oz)-128.8 kg (283 lb 15.2 oz)] 126.9 kg (279 lb 12.2 oz) (02/20 0500) Last BM Date: 06/15/16  Intake/Output from previous day: 02/19 0701 - 02/20 0700 In: 2638.5 [I.V.:2638.5] Out: 6213 [Urine:665; Emesis/NG output:5950] Intake/Output this shift: No intake/output data recorded.  General appearance: alert and cooperative Resp: clear to auscultation bilaterally Cardio: tachycardic GI: soft, min dist, hypoactive BS, incision c/d/i  Lab Results:   Recent Labs  06/20/16 1022 06/21/16 0348  WBC 14.5* 12.0*  HGB 13.0 10.5*  HCT 39.0 32.5*  PLT 429* 359   BMET  Recent Labs  06/20/16 0345 06/21/16 0348  NA 144 150*  K 4.0 3.4*  CL 94* 96*  CO2 37* 43*  GLUCOSE 166* 136*  BUN 24* 33*  CREATININE 0.86 1.14*  CALCIUM 10.2 9.7   PT/INR No results for input(s): LABPROT, INR in the last 72 hours. ABG  Recent Labs  06/20/16 1309 06/21/16 0339  PHART 7.510* 7.558*  HCO3 38.3* 45.7*    Studies/Results: Dg Abd 1 View  Result Date: 06/20/2016 CLINICAL DATA:  Post surgery EXAM: ABDOMEN - 1 VIEW COMPARISON:  06/18/2016 FINDINGS: Gaseous distended small bowel loops are noted throughout the abdomen highly suspicious for significant ileus or partial bowel obstruction. Skin staples are noted lower abdominal and pelvic wall. Metallic fusion material lumbar spine L5-S1 level. IMPRESSION: Gaseous distended small bowel loops throughout the abdomen suspicious for significant ileus or partial bowel obstruction.  Postsurgical changes with skin staples lower abdominal and pelvic wall. Electronically Signed   By: Natasha Mead M.D.   On: 06/20/2016 11:27   Ct Angio Chest Pe W Or Wo Contrast  Result Date: 06/20/2016 CLINICAL DATA:  Recent surgery. Now with shortness of breath car M 5 Gy aberrant EXAM: CT ANGIOGRAPHY CHEST WITH CONTRAST TECHNIQUE: Multidetector CT imaging of the chest was performed using the standard protocol during bolus administration of intravenous contrast. Multiplanar CT image reconstructions and MIPs were obtained to evaluate the vascular anatomy. CONTRAST:  70 cc of Isovue 370 COMPARISON:  05/13/2016 FINDINGS: Cardiovascular: There is no pericardial effusion. There is mild cardiac enlargement. Aortic atherosclerosis is identified. Calcification in the RCA and LAD coronary artery noted. There is filling defect within the distal right pulmonary artery and extending into the right upper lobar and segmental branches. Findings are compatible with acute pulmonary embolus. Mediastinum/Nodes: The trachea appears patent and is midline. There is abnormal dilatation and distention of the esophagus with fluid level. There is increase caliber of the stomach and visualized portions of the small bowel loops. No mediastinal or hilar adenopathy. No axillary or supraclavicular adenopathy. Lungs/Pleura: Small to moderate bilateral pleural effusions identified. There is atelectasis within both lower lobes. Upper Abdomen: Marked distension of the stomach. Increase caliber of the small bowel loops with air-fluid levels identified. Musculoskeletal: Mild degenerative disc disease identified within the thoracic spine. Review of the MIP images confirms the above findings. IMPRESSION: 1. Examination is positive for acute pulmonary embolus within the right main pulmonary artery and  right upper lobar and segmental arteries. 2. Bilateral pleural effusions and bilateral lower lobe atelectasis. 3. Abnormal gastric and small bowel  distention. Additionally, the esophagus is dilated and fluid-filled. Findings may reflect sequelae of postoperative ileus versus bowel obstruction. 4. Critical Value/emergent results were called by telephone at the time of interpretation on 06/20/2016 at 11:42 am to Dr. Charlean Merlhelsea Conor, who verbally acknowledged these results. Electronically Signed   By: Signa Kellaylor  Stroud M.D.   On: 06/20/2016 11:43   Dg Chest Port 1 View  Result Date: 06/21/2016 CLINICAL DATA:  66 year old female with altered mental status. Pulmonary embolism seen on the earlier CT. EXAM: PORTABLE CHEST 1 VIEW COMPARISON:  Chest CT dated 06/20/2016 FINDINGS: There is shallow inspiration. Small bilateral pleural effusions as well as bibasilar airspace densities, left greater right as seen on the prior CT. There is no pneumothorax. There is mild cardiomegaly. No acute osseous pathology. IMPRESSION: 1. Small bilateral pleural effusions and bibasilar subsegmental airspace densities, left greater than right. 2. Mild cardiomegaly. Electronically Signed   By: Elgie CollardArash  Radparvar M.D.   On: 06/21/2016 03:31   Dg Chest Port 1 View  Result Date: 06/20/2016 CLINICAL DATA:  Shortness of Breath EXAM: PORTABLE CHEST 1 VIEW COMPARISON:  May 07, 2010 FINDINGS: Degree of inspiration is shallow. There is patchy bibasilar atelectasis, more on the right than on the left. Lungs elsewhere are clear. Heart is upper normal in size with pulmonary vascular within normal limits. No adenopathy. No bone lesions. IMPRESSION: Shallow degree of inspiration bibasilar atelectasis, more on the right than on the left. Lungs elsewhere clear. Stable cardiac silhouette. Electronically Signed   By: Bretta BangWilliam  Woodruff III M.D.   On: 06/20/2016 09:07   Dg Abd Portable 1v  Result Date: 06/20/2016 CLINICAL DATA:  Nasogastric tube placement.  Initial encounter. EXAM: PORTABLE ABDOMEN - 1 VIEW COMPARISON:  Abdominal radiograph performed earlier today at 10:47 a.m. FINDINGS: Diffuse  dilatation of small-bowel loops is again noted, mildly improved from the prior study but concerning for either persistent ileus or partial small bowel obstruction. Trace air is noted within the colon. A Foley catheter is noted, with trace contrast in the bladder. Postoperative change is noted along the lower abdomen. The patient's enteric tube is noted ending overlying the body of the stomach. No acute osseous abnormalities are seen. Lumbosacral spinal fusion hardware is noted. IMPRESSION: 1. Enteric tube noted ending overlying the body of the stomach. 2. Diffuse dilatation of small-bowel loops is mildly improved from the prior study. This may reflect persistent ileus or partial small-bowel obstruction, slightly improved from the prior study. Electronically Signed   By: Roanna RaiderJeffery  Chang M.D.   On: 06/20/2016 19:51    Anti-infectives: Anti-infectives    Start     Dose/Rate Route Frequency Ordered Stop   06/15/16 0800  ceFAZolin (ANCEF) 3 g in dextrose 5 % 50 mL IVPB     3 g 130 mL/hr over 30 Minutes Intravenous On call to O.R. 06/14/16 1022 06/15/16 0840      Assessment/Plan: s/p Procedure(s): OPEN VENTRAL HERNIA REPAIR (N/A) INSERTION OF ETHICON ULTRAPRO MESH (N/A) Ileus-  will order SBO protocol to r/o SBO,  cont' NGT PE - con't heparin gtt, will consult CCM to assist in pulm optimization Hypokalemia - Replace lytes, change IVF and add K+ Mobilize as tol with PT, up to chair Con't Foley for accurate I&O's Con't ICU for now     LOS: 6 days    Marigene Ehlersamirez Jr., Jed LimerickArmando 06/21/2016

## 2016-06-21 NOTE — Consult Note (Signed)
PULMONARY / CRITICAL CARE MEDICINE   Name: Rhonda [name] MRN: [id number] DOB: 07/27/1950    ADMISSION DATE:  06/15/2016 CONSULTATION DATE:  06/21/2016  REFERRING MD:  Dr. Derrell Lolling CCS  CHIEF COMPLAINT:  PE  HISTORY OF PRESENT ILLNESS:   66 year old female with past medical history as below, which is significant for hypertension, hyperlipidemia, diabetes mellitus, and GERD. She also has long-standing history of ventral hernia. Recently she tended to have a colonoscopy which could not be completed due to her hernia. On 2/14 she presented to [facility] for elective open ventral hernia repair. The procedure was without complication, however, her hospital course has been complicated by postoperative ileus, delirium, and pulmonary embolism. For these reasons she is being managed by the surgery service in the intensive care unit. P CCM has been asked to see for further evaluation and medical management.  PAST MEDICAL HISTORY :  She  has a past medical history of Anemia; Arthritis; Complication of anesthesia; GERD (gastroesophageal reflux disease); Hyperlipemia; Hypertension; Sinus congestion; Sinus headache; and Thyroid nodule (2010).  PAST SURGICAL HISTORY: She  has a past surgical history that includes Cesarean section (1980; 1988); Tonsillectomy; Colonoscopy (03/31/2016); Hernia repair; Abdominal hernia repair (06/15/2016); Posterior lumbar fusion (2012); Back surgery; Fracture surgery; Patella fracture surgery (Left, 1984); Tubal ligation (1988); Ventral hernia repair (N/A, 06/15/2016); and Insertion of mesh (N/A, 06/15/2016).  Allergies  Allergen Reactions  . Aspirin-Acetaminophen-Caffeine Nausea And Vomiting  . Codeine Diarrhea    Stomach cramps     No current facility-administered medications on file prior to encounter.    Current Outpatient Prescriptions on File Prior to Encounter  Medication Sig  . dorzolamide-timolol (COSOPT) 22.3-6.8 MG/ML ophthalmic solution Place 1  drop into both eyes 2 (two) times daily.   Marland Kitchen ezetimibe (ZETIA) 10 MG tablet Take 10 mg by mouth every evening.   . ranitidine (ZANTAC) 300 MG capsule Take 300 mg by mouth every evening.  . valsartan-hydrochlorothiazide (DIOVAN-HCT) 320-25 MG tablet Take 1 tablet by mouth daily.   . fluticasone (FLONASE) 50 MCG/ACT nasal spray Use 2 sprays in each nostril once daily for stuffy nose or drainage.    FAMILY HISTORY:  Her has no family status information on file.    SOCIAL HISTORY: She  reports that she has never smoked. She has never used smokeless tobacco. She reports that she does not drink alcohol or use drugs.  REVIEW OF SYSTEMS:   Bolds are positive  Constitutional: weight loss, gain, night sweats, Fevers, chills, fatigue .  HEENT: headaches, Sore throat, sneezing, nasal congestion, post nasal drip, Difficulty swallowing, Tooth/dental problems, visual complaints visual changes, ear ache CV:  chest pain, radiates:,Orthopnea, PND, swelling in lower extremities, dizziness, palpitations, syncope.  GI  heartburn, indigestion, abdominal pain, nausea, vomiting, diarrhea, change in bowel habits, loss of appetite, bloody stools.  Resp: cough, productive: , hemoptysis, dyspnea, chest pain, pleuritic.  Skin: rash or itching or icterus GU: dysuria, change in color of urine, urgency or frequency. flank pain, hematuria  MS: joint pain or swelling. decreased range of motion  Psych: change in mood or affect. depression or anxiety.  Neuro: difficulty with speech, weakness, numbness, ataxia   SUBJECTIVE:    VITAL SIGNS: BP 111/60   Pulse (!) 109   Temp 99.1 F (37.3 C) (Oral)   Resp 18   Ht 5\' 4"  (1.626 m)   Wt 126.9 kg (279 lb 12.2 oz)   SpO2 99%   BMI 48.02 kg/m   HEMODYNAMICS:    VENTILATOR  SETTINGS:    INTAKE / OUTPUT: I/O last 3 completed shifts: In: 4263.6 [P.O.:180; I.V.:4083.6] Out: 7165 [Urine:1215; Emesis/NG output:5950]  PHYSICAL EXAMINATION: General:  Elderly female  in NAD, appears tired Neuro:  Sleeping but easily arouses to verbal. Oriented, non-focal.  HEENT:  [place]/AT, PERRL, no JVD Cardiovascular:  RRR, no MRG Lungs:  Clear Abdomen:  Soft, mildly tender, non-distended. Longitudinal surgical incision with staples in place. No s/s infection.   Musculoskeletal: Noa cute deformity or ROM limitation Skin:  Grossly intact  LABS:  BMET  Recent Labs Lab 06/19/16 0439 06/20/16 0345 06/21/16 0348  NA 138 144 150*  K 3.3* 4.0 3.4*  CL 97* 94* 96*  CO2 32 37* 43*  BUN 17 24* 33*  CREATININE 0.67 0.86 1.14*  GLUCOSE 136* 166* 136*    Electrolytes  Recent Labs Lab 06/17/16 0537  06/19/16 0439 06/20/16 0345 06/21/16 0348  CALCIUM 8.9  < > 9.0 10.2 9.7  MG 1.8  --   --   --  2.2  < > = values in this interval not displayed.  CBC  Recent Labs Lab 06/19/16 0439 06/20/16 1022 06/21/16 0348  WBC 11.9* 14.5* 12.0*  HGB 10.4* 13.0 10.5*  HCT 32.4* 39.0 32.5*  PLT 303 429* 359    Coag's No results for input(s): APTT, INR in the last 168 hours.  Sepsis Markers  Recent Labs Lab 06/20/16 1022 06/20/16 1824  LATICACIDVEN 3.0* 2.0*    ABG  Recent Labs Lab 06/20/16 1309 06/21/16 0339  PHART 7.510* 7.558*  PCO2ART 47.7 51.3*  PO2ART 46.0* 62.0*    Liver Enzymes No results for input(s): AST, ALT, ALKPHOS, BILITOT, ALBUMIN in the last 168 hours.  Cardiac Enzymes  Recent Labs Lab 06/20/16 0931 06/20/16 1824 06/20/16 2304  TROPONINI 0.05* 0.07* 0.03*    Glucose  Recent Labs Lab 06/20/16 1201 06/20/16 1552 06/20/16 1953 06/20/16 2331 06/21/16 0337 06/21/16 0806  GLUCAP 160* 108* 118* 125* 126* 122*    Imaging Dg Abd 1 View  Result Date: 06/20/2016 CLINICAL DATA:  Post surgery EXAM: ABDOMEN - 1 VIEW COMPARISON:  06/18/2016 FINDINGS: Gaseous distended small bowel loops are noted throughout the abdomen highly suspicious for significant ileus or partial bowel obstruction. Skin staples are noted lower abdominal  and pelvic wall. Metallic fusion material lumbar spine L5-S1 level. IMPRESSION: Gaseous distended small bowel loops throughout the abdomen suspicious for significant ileus or partial bowel obstruction. Postsurgical changes with skin staples lower abdominal and pelvic wall. Electronically Signed   By: Natasha Mead M.D.   On: 06/20/2016 11:27   Ct Angio Chest Pe W Or Wo Contrast  Result Date: 06/20/2016 CLINICAL DATA:  Recent surgery. Now with shortness of breath car M 5 Gy aberrant EXAM: CT ANGIOGRAPHY CHEST WITH CONTRAST TECHNIQUE: Multidetector CT imaging of the chest was performed using the standard protocol during bolus administration of intravenous contrast. Multiplanar CT image reconstructions and MIPs were obtained to evaluate the vascular anatomy. CONTRAST:  70 cc of Isovue 370 COMPARISON:  05/13/2016 FINDINGS: Cardiovascular: There is no pericardial effusion. There is mild cardiac enlargement. Aortic atherosclerosis is identified. Calcification in the RCA and LAD coronary artery noted. There is filling defect within the distal right pulmonary artery and extending into the right upper lobar and segmental branches. Findings are compatible with acute pulmonary embolus. Mediastinum/Nodes: The trachea appears patent and is midline. There is abnormal dilatation and distention of the esophagus with fluid level. There is increase caliber of the stomach and visualized portions of the  small bowel loops. No mediastinal or hilar adenopathy. No axillary or supraclavicular adenopathy. Lungs/Pleura: Small to moderate bilateral pleural effusions identified. There is atelectasis within both lower lobes. Upper Abdomen: Marked distension of the stomach. Increase caliber of the small bowel loops with air-fluid levels identified. Musculoskeletal: Mild degenerative disc disease identified within the thoracic spine. Review of the MIP images confirms the above findings. IMPRESSION: 1. Examination is positive for acute pulmonary  embolus within the right main pulmonary artery and right upper lobar and segmental arteries. 2. Bilateral pleural effusions and bilateral lower lobe atelectasis. 3. Abnormal gastric and small bowel distention. Additionally, the esophagus is dilated and fluid-filled. Findings may reflect sequelae of postoperative ileus versus bowel obstruction. 4. Critical Value/emergent results were called by telephone at the time of interpretation on 06/20/2016 at 11:42 am to Dr. Charlean Merl, who verbally acknowledged these results. Electronically Signed   By: Signa Kell M.D.   On: 06/20/2016 11:43   Dg Chest Port 1 View  Result Date: 06/21/2016 CLINICAL DATA:  66 year old female with altered mental status. Pulmonary embolism seen on the earlier CT. EXAM: PORTABLE CHEST 1 VIEW COMPARISON:  Chest CT dated 06/20/2016 FINDINGS: There is shallow inspiration. Small bilateral pleural effusions as well as bibasilar airspace densities, left greater right as seen on the prior CT. There is no pneumothorax. There is mild cardiomegaly. No acute osseous pathology. IMPRESSION: 1. Small bilateral pleural effusions and bibasilar subsegmental airspace densities, left greater than right. 2. Mild cardiomegaly. Electronically Signed   By: Elgie Collard M.D.   On: 06/21/2016 03:31   Dg Abd Portable 1v  Result Date: 06/20/2016 CLINICAL DATA:  Nasogastric tube placement.  Initial encounter. EXAM: PORTABLE ABDOMEN - 1 VIEW COMPARISON:  Abdominal radiograph performed earlier today at 10:47 a.m. FINDINGS: Diffuse dilatation of small-bowel loops is again noted, mildly improved from the prior study but concerning for either persistent ileus or partial small bowel obstruction. Trace air is noted within the colon. A Foley catheter is noted, with trace contrast in the bladder. Postoperative change is noted along the lower abdomen. The patient's enteric tube is noted ending overlying the body of the stomach. No acute osseous abnormalities are  seen. Lumbosacral spinal fusion hardware is noted. IMPRESSION: 1. Enteric tube noted ending overlying the body of the stomach. 2. Diffuse dilatation of small-bowel loops is mildly improved from the prior study. This may reflect persistent ileus or partial small-bowel obstruction, slightly improved from the prior study. Electronically Signed   By: Roanna Raider M.D.   On: 06/20/2016 19:51     STUDIES:  1/12 Ct abd > Large paraumbilical hernia containing a loop of the transverse colon. Proximal colon is not dilated. Probable cholelithiasis. 2/19 CTA chest > Examination is positive for acute pulmonary embolus within the right main pulmonary artery and right upper lobar and segmental arteries. Bilateral pleural effusions and bilateral lower lobe atelectasis. Abnormal gastric and small bowel distention. Additionally, the esophagus is dilated and fluid-filled. Findings may reflect sequelae of postoperative ileus versus bowel obstruction.  CULTURES:   ANTIBIOTICS: Periop ancef >complete 2/14  SIGNIFICANT EVENTS: 2/14 admit, open hernia repair 2/19 PE  LINES/TUBES:   ASSESSMENT / PLAN:  Ileus s/p open ventral hernia repair: passing flatus this AM, no BM as of yet - CCS primary, management per them - NGT to LIS - NPO - Hopefully can avoid TPN  Pulmonary embolism: - Continue heparin infusion - No role for lytics at this time - Echo - Doppler legs  GERD - Protonix  IV  Hypernatremia - IVF to D5 1/2NS  Hypokalemia - Repleted by CCS with K in IVF  H/o HTN - PRN hydralazine  Hyperglycemia -CBG monitoring and SSI  Delirium: improved - Establish sleep/wake cycle. Lights and TV on during day  Joneen Roach, AGACNP-BC [facility] Pulmonology/Critical Care Pager [phone] or [phone]  06/21/2016 9:35 AM

## 2016-06-21 NOTE — Progress Notes (Signed)
*  Preliminary Results* Bilateral lower extremity venous duplex completed. Study was technically difficult and limited due to patient body habitus. Visualized veins of bilateral lower extremities are negative for deep vein thrombosis. There is no evidence of Baker's cyst bilaterally.  06/21/2016 12:46 PM Gertie FeyMichelle Sharlyn Odonnel, BS, RVT, RDCS, RDMS

## 2016-06-21 NOTE — Progress Notes (Signed)
ANTICOAGULATION CONSULT NOTE - FOLLOW UP    HL = 0.63 (goal 0.3 - 0.7 units/mL) Heparin dosing weight = 86 kg   Assessment: 66 YOF s/p hernia repair to continue on IV heparin for acute PE.  Heparin level now therapeutic; no bleeding reported.   Plan: - Reduce heparin gtt slightly to 1450 units/hr - F/U AM labs    Rhonda [name] D. Laney Potashang, PharmD, BCPS 06/21/2016, 9:49 PM

## 2016-06-21 NOTE — Progress Notes (Signed)
ABG collected. Pt consented to blood draw. RN at bedside.

## 2016-06-21 NOTE — Progress Notes (Signed)
PT Cancellation Note  Patient Details Name: Rhonda MiyamotoLuretta M [name] MRN: [id number] DOB: May 25, 1950   Cancelled Treatment:    Reason Eval/Treat Not Completed: Patient not medically ready Pt began IV heparin less than 24 hours ago (8pm 06/20/16) for PE. Will resume treatment tomorrow if able.    Sherese Heyward 06/21/2016, 1:50 PM   Lane HackerErika Saphira Lahmann, SPT Acute Rehab SPT [phone]

## 2016-06-21 NOTE — Progress Notes (Addendum)
ANTICOAGULATION CONSULT NOTE - Follow Up Consult  Pharmacy Consult for heparin Indication: pulmonary embolus  Allergies  Allergen Reactions  . Aspirin-Acetaminophen-Caffeine Nausea And Vomiting  . Codeine Diarrhea    Stomach cramps     Patient Measurements: Height: 5\' 4"  (162.6 cm) Weight: 279 lb 12.2 oz (126.9 kg) IBW/kg (Calculated) : 54.7 Heparin Dosing Weight: 86 kg  Vital Signs: Temp: 98.1 F (36.7 C) (02/20 1148) Temp Source: Oral (02/20 1148) BP: 103/67 (02/20 1305) Pulse Rate: 104 (02/20 1305)  Labs:  Recent Labs  06/19/16 0439 06/20/16 0345 06/20/16 0931 06/20/16 1022 06/20/16 1824 06/20/16 2031 06/20/16 2304 06/21/16 0346 06/21/16 0348 06/21/16 1114  HGB 10.4*  --   --  13.0  --   --   --   --  10.5*  --   HCT 32.4*  --   --  39.0  --   --   --   --  32.5*  --   PLT 303  --   --  429*  --   --   --   --  359  --   HEPARINUNFRC  --   --   --   --   --  0.27*  --  0.63  --  0.84*  CREATININE 0.67 0.86  --   --   --   --   --   --  1.14*  --   TROPONINI  --   --  0.05*  --  0.07*  --  0.03*  --   --   --     Estimated Creatinine Clearance: 63.2 mL/min (by C-G formula based on SCr of 1.14 mg/dL (H)).   Assessment: 66 yo F admitted 06/15/2016 s/p hernia repair, now with acute PE. Pharmacy consulted to dose heparin.   Heparin level 0.84 (supratherapeutic), Hgb 10.5, Plt wnl, no signs/symptoms of bleeding per RN  Goal of Therapy:  Heparin level 0.3-0.7 units/ml Monitor platelets by anticoagulation protocol: Yes   Plan:  - Decrease heparin to 1500 units/hr - Monitor 6 hour heparin level - Monitor daily heparin level, CBC and signs/symptoms of bleeding - Follow up plans for oral anticoagulation  Casilda Carlsaylor Shaconda Hajduk, PharmD, BCPS PGY-2 Infectious Diseases Pharmacy Resident Pager: [phone] 06/21/2016,2:03 PM

## 2016-06-21 NOTE — Progress Notes (Signed)
ANTICOAGULATION CONSULT NOTE - Follow-up Consult  Pharmacy Consult for heparin Indication: pulmonary embolus  Allergies  Allergen Reactions  . Aspirin-Acetaminophen-Caffeine Nausea And Vomiting  . Codeine Diarrhea    Stomach cramps     Patient Measurements: Height: 5\' 4"  (162.6 cm) Weight: 283 lb 15.2 oz (128.8 kg) IBW/kg (Calculated) : 54.7 Heparin Dosing Weight: 86 kg  Vital Signs: Temp: 98.6 F (37 C) (02/20 0338) Temp Source: Oral (02/20 0338) BP: 142/71 (02/20 0400) Pulse Rate: 109 (02/20 0400)  Labs:  Recent Labs  06/19/16 0439 06/20/16 0345 06/20/16 0931 06/20/16 1022 06/20/16 1824 06/20/16 2031 06/20/16 2304 06/21/16 0346 06/21/16 0348  HGB 10.4*  --   --  13.0  --   --   --   --  10.5*  HCT 32.4*  --   --  39.0  --   --   --   --  32.5*  PLT 303  --   --  429*  --   --   --   --  359  HEPARINUNFRC  --   --   --   --   --  0.27*  --  0.63  --   CREATININE 0.67 0.86  --   --   --   --   --   --  1.14*  TROPONINI  --   --  0.05*  --  0.07*  --  0.03*  --   --     Estimated Creatinine Clearance: 63.7 mL/min (by C-G formula based on SCr of 1.14 mg/dL (H)).  Assessment: 66 yo f on heparin for PE. Heparin level therapeutic (0.63) on gtt at 1600 units/hr. CBC stable.  Goal of Therapy:  Heparin level 0.3-0.7 units/ml Monitor platelets by anticoagulation protocol: Yes   Plan:  Continue heparin infusion 1600 units/hr Will f/u 6hr confirmatory heparin level  Christoper Fabianaron Beanca Kiester, PharmD, BCPS Clinical pharmacist, pager [phone] 06/21/2016 5:07 AM

## 2016-06-21 NOTE — Care Management Note (Signed)
Case Management Note  Patient Details  Name: Rhonda [name] MRN: [id number] Date of Birth: Dec 10, 1950  Subjective/Objective:    Pt is post op hernia repair - noted to have PE and ileus  transferred to ICU                Action/Plan:  PTA from home.  PT to reassess   Expected Discharge Date:                  Expected Discharge Plan:  Home w Home Health Services  In-House Referral:     Discharge planning Services  CM Consult  Post Acute Care Choice:    Choice offered to:     DME Arranged:    DME Agency:     HH Arranged:    HH Agency:     Status of Service:  In process, will continue to follow  If discussed at Long Length of Stay Meetings, dates discussed:    Additional Comments:  Rhonda [name], Rhonda Siefker S, RN 06/21/2016, 2:11 PM

## 2016-06-21 NOTE — Progress Notes (Signed)
Pt is refusing ABG at this time and requesting to speak to her daughters. RN has one of her daughter on the phone at this time. Pt is alert and oriented. RN aware and at bedside. Rad tech at bedside getting a chest film of her lungs.

## 2016-06-22 ENCOUNTER — Inpatient Hospital Stay (HOSPITAL_COMMUNITY): Payer: Medicare Other

## 2016-06-22 DIAGNOSIS — I2699 Other pulmonary embolism without acute cor pulmonale: Secondary | ICD-10-CM

## 2016-06-22 DIAGNOSIS — I2690 Septic pulmonary embolism without acute cor pulmonale: Secondary | ICD-10-CM

## 2016-06-22 LAB — HEPARIN LEVEL (UNFRACTIONATED)
HEPARIN UNFRACTIONATED: 0.28 [IU]/mL — AB (ref 0.30–0.70)
Heparin Unfractionated: 0.26 IU/mL — ABNORMAL LOW (ref 0.30–0.70)
Heparin Unfractionated: 0.5 IU/mL (ref 0.30–0.70)

## 2016-06-22 LAB — GLUCOSE, CAPILLARY
GLUCOSE-CAPILLARY: 145 mg/dL — AB (ref 65–99)
GLUCOSE-CAPILLARY: 105 mg/dL — AB (ref 65–99)
Glucose-Capillary: 127 mg/dL — ABNORMAL HIGH (ref 65–99)
Glucose-Capillary: 131 mg/dL — ABNORMAL HIGH (ref 65–99)
Glucose-Capillary: 132 mg/dL — ABNORMAL HIGH (ref 65–99)
Glucose-Capillary: 144 mg/dL — ABNORMAL HIGH (ref 65–99)

## 2016-06-22 LAB — ECHOCARDIOGRAM COMPLETE
HEIGHTINCHES: 64 in
WEIGHTICAEL: 4363.34 [oz_av]

## 2016-06-22 LAB — CBC
HCT: 30.7 % — ABNORMAL LOW (ref 36.0–46.0)
HEMOGLOBIN: 9.6 g/dL — AB (ref 12.0–15.0)
MCH: 27.9 pg (ref 26.0–34.0)
MCHC: 31.3 g/dL (ref 30.0–36.0)
MCV: 89.2 fL (ref 78.0–100.0)
PLATELETS: 314 10*3/uL (ref 150–400)
RBC: 3.44 MIL/uL — AB (ref 3.87–5.11)
RDW: 15.4 % (ref 11.5–15.5)
WBC: 9.6 10*3/uL (ref 4.0–10.5)

## 2016-06-22 LAB — BASIC METABOLIC PANEL
Anion gap: 9 (ref 5–15)
BUN: 25 mg/dL — ABNORMAL HIGH (ref 6–20)
CHLORIDE: 101 mmol/L (ref 101–111)
CO2: 39 mmol/L — ABNORMAL HIGH (ref 22–32)
CREATININE: 1.05 mg/dL — AB (ref 0.44–1.00)
Calcium: 9.1 mg/dL (ref 8.9–10.3)
GFR, EST NON AFRICAN AMERICAN: 54 mL/min — AB (ref >60)
Glucose, Bld: 154 mg/dL — ABNORMAL HIGH (ref 65–99)
POTASSIUM: 4 mmol/L (ref 3.5–5.1)
SODIUM: 149 mmol/L — AB (ref 135–145)

## 2016-06-22 LAB — LACTIC ACID, PLASMA: Lactic Acid, Venous: 1.3 mmol/L (ref 0.5–1.9)

## 2016-06-22 MED ORDER — BISACODYL 10 MG RE SUPP
10.0000 mg | Freq: Every day | RECTAL | Status: DC | PRN
  Administered 2016-06-22: 10 mg via RECTAL
  Filled 2016-06-22: qty 1

## 2016-06-22 NOTE — Progress Notes (Signed)
  Echocardiogram 2D Echocardiogram has been performed.  Leta JunglingCooper, Carmita Boom M 06/22/2016, 1:28 PM

## 2016-06-22 NOTE — Care Management Note (Signed)
Case Management Note  Patient Details  Name: Ronaldo MiyamotoLuretta M [name] MRN: [id number] Date of Birth: Oct 09, 1950  Subjective/Objective:    Pt is post op hernia repair - noted to have PE and ileus  transferred to ICU                Action/Plan:  PTA from home.  PT to reassess   Expected Discharge Date:                  Expected Discharge Plan:  Home w Home Health Services  In-House Referral:     Discharge planning Services  CM Consult  Post Acute Care Choice:    Choice offered to:     DME Arranged:    DME Agency:     HH Arranged:    HH Agency:     Status of Service:  In process, will continue to follow  If discussed at Long Length of Stay Meetings, dates discussed:    Additional Comments: Pt alert and oriented- daughter at bedside.  Pt states she is completely independent and cares for her 66 year old mother.  Pt requested HHRN and Aide dependent [facility], pt is in agreement with HHPT - pt [facility]  CM will request order via physician sticky tab.  Daughters are working on the plan to provide 24 hour supervision as pt declined SNF. Cherylann ParrClaxton, Bobetta Korf S, RN 06/22/2016, 11:59 AM

## 2016-06-22 NOTE — Progress Notes (Signed)
PULMONARY / CRITICAL CARE MEDICINE   Name: Rhonda [name] MRN: [id number] DOB: 07/02/1950    ADMISSION DATE:  06/15/2016 CONSULTATION DATE:  06/21/2016  REFERRING MD:  Dr. Derrell Lolling CCS  CHIEF COMPLAINT:  PE  HISTORY OF PRESENT ILLNESS:   66 year old female with past medical history as below, which is significant for hypertension, hyperlipidemia, diabetes mellitus, and GERD. She also has long-standing history of ventral hernia. Recently she tended to have a colonoscopy which could not be completed due to her hernia. On 2/14 she presented to [facility] for elective open ventral hernia repair. The procedure was without complication, however, her hospital course has been complicated by postoperative ileus, delirium, and pulmonary embolism. For these reasons she is being managed by the surgery service in the intensive care unit. P CCM has been asked to see for further evaluation and medical management.  PAST MEDICAL HISTORY :  She  has a past medical history of Anemia; Arthritis; Complication of anesthesia; GERD (gastroesophageal reflux disease); Hyperlipemia; Hypertension; Sinus congestion; Sinus headache; and Thyroid nodule (2010).  PAST SURGICAL HISTORY: She  has a past surgical history that includes Cesarean section (1980; 1988); Tonsillectomy; Colonoscopy (03/31/2016); Hernia repair; Abdominal hernia repair (06/15/2016); Posterior lumbar fusion (2012); Back surgery; Fracture surgery; Patella fracture surgery (Left, 1984); Tubal ligation (1988); Ventral hernia repair (N/A, 06/15/2016); and Insertion of mesh (N/A, 06/15/2016).  Allergies  Allergen Reactions  . Aspirin-Acetaminophen-Caffeine Nausea And Vomiting  . Codeine Diarrhea    Stomach cramps     No current facility-administered medications on file prior to encounter.    Current Outpatient Prescriptions on File Prior to Encounter  Medication Sig  . dorzolamide-timolol (COSOPT) 22.3-6.8 MG/ML ophthalmic solution Place 1  drop into both eyes 2 (two) times daily.   Marland Kitchen ezetimibe (ZETIA) 10 MG tablet Take 10 mg by mouth every evening.   . ranitidine (ZANTAC) 300 MG capsule Take 300 mg by mouth every evening.  . valsartan-hydrochlorothiazide (DIOVAN-HCT) 320-25 MG tablet Take 1 tablet by mouth daily.   . fluticasone (FLONASE) 50 MCG/ACT nasal spray Use 2 sprays in each nostril once daily for stuffy nose or drainage.    FAMILY HISTORY:  Her has no family status information on file.    SOCIAL HISTORY: She  reports that she has never smoked. She has never used smokeless tobacco. She reports that she does not drink alcohol or use drugs.  REVIEW OF SYSTEMS:   Bolds are positive  Constitutional: weight loss, gain, night sweats, Fevers, chills, fatigue .  HEENT: headaches, Sore throat, sneezing, nasal congestion, post nasal drip, Difficulty swallowing, Tooth/dental problems, visual complaints visual changes, ear ache CV:  chest pain, radiates:,Orthopnea, PND, swelling in lower extremities, dizziness, palpitations, syncope.  GI  heartburn, indigestion, abdominal pain, nausea, vomiting, diarrhea, change in bowel habits, loss of appetite, bloody stools.  Resp:Denies any cough, dyspnea, wheezing, sputum production. Skin: rash or itching or icterus GU: dysuria, change in color of urine, urgency or frequency. flank pain, hematuria  MS: joint pain or swelling. decreased range of motion  Psych: change in mood or affect. depression or anxiety.  Neuro: difficulty with speech, weakness, numbness, ataxia   SUBJECTIVE:  No major issues. Doing well on room air. Hemodynamically stable.  VITAL SIGNS: BP 118/63   Pulse (!) 103   Temp 99.2 F (37.3 C) (Oral)   Resp (!) 29   Ht 5\' 4"  (1.626 m)   Wt 272 lb 11.3 oz (123.7 kg)   SpO2 97%   BMI 46.81 kg/m  HEMODYNAMICS:    VENTILATOR SETTINGS:    INTAKE / OUTPUT: I/O last 3 completed shifts: In: 4752.9 [I.V.:4488; IV Piggyback:264.9] Out: 4034 [VQQVZ:5638; Emesis/NG  output:2050]  PHYSICAL EXAMINATION: General:  Elderly female in no distress Neuro: No focal deficits.  HEENT:  No thyromegaly, JVD Cardiovascular:  RRR, no MRG Lungs:  Clear Abdomen:  Soft, positive bowel sounds  Musculoskeletal: Normal tone and bulk Skin:  No rash  LABS:  BMET  Recent Labs Lab 06/21/16 0348 06/21/16 1636 06/22/16 0240  NA 150* 147* 149*  K 3.4* 3.8 4.0  CL 96* 101 101  CO2 43* 38* 39*  BUN 33* 28* 25*  CREATININE 1.14* 1.02* 1.05*  GLUCOSE 136* 155* 154*    Electrolytes  Recent Labs Lab 06/17/16 0537  06/21/16 0348 06/21/16 1636 06/22/16 0240  CALCIUM 8.9  < > 9.7 9.2 9.1  MG 1.8  --  2.2  --   --   < > = values in this interval not displayed.  CBC  Recent Labs Lab 06/21/16 0348 06/21/16 1636 06/22/16 0240  WBC 12.0* 11.0* 9.6  HGB 10.5* 10.2* 9.6*  HCT 32.5* 31.8* 30.7*  PLT 359 317 314    Coag's No results for input(s): APTT, INR in the last 168 hours.  Sepsis Markers  Recent Labs Lab 06/20/16 1022 06/20/16 1824 06/22/16 0240  LATICACIDVEN 3.0* 2.0* 1.3    ABG  Recent Labs Lab 06/20/16 1309 06/21/16 0339  PHART 7.510* 7.558*  PCO2ART 47.7 51.3*  PO2ART 46.0* 62.0*    Liver Enzymes No results for input(s): AST, ALT, ALKPHOS, BILITOT, ALBUMIN in the last 168 hours.  Cardiac Enzymes  Recent Labs Lab 06/20/16 0931 06/20/16 1824 06/20/16 2304  TROPONINI 0.05* 0.07* 0.03*    Glucose  Recent Labs Lab 06/21/16 1934 06/21/16 2351 06/22/16 0336 06/22/16 0805 06/22/16 1138 06/22/16 1525  GLUCAP 131* 151* 145* 127* 131* 105*    Imaging Dg Chest Port 1 View  Result Date: 06/22/2016 CLINICAL DATA:  66 year old female with pulmonary embolism. EXAM: PORTABLE CHEST 1 VIEW COMPARISON:  Chest radiograph dated 06/21/2016 and CT dated 06/20/2016 FINDINGS: There has been interval placement of an enteric tube extending into the left hemiabdomen with tip beyond the inferior margin of the image. There is shallow  inspiration. Stable mild enlargement of the cardiac silhouette. Left lung base density with silhouetting of the left hemidiaphragm appears similar to prior study and likely combination of small pleural effusion and atelectasis versus infiltrate. Overall there is no significant interval change in the appearance of the lungs compared to the prior radiograph. There is no pneumothorax. An air distended loop of probable small bowel in the epigastric area measures up to 5 cm in diameter. IMPRESSION: 1. No significant interval change in the appearance of the lungs compared to prior radiograph. Shallow inspiration and small left pleural effusion and left lung base atelectasis/infiltrate. 2. Interval placement of an enteric tube extending into the left hemiabdomen. A dilated air-filled loop of small bowel in the epigastric area measures up to 5 cm in diameter. Electronically Signed   By: Elgie Collard M.D.   On: 06/22/2016 06:12     STUDIES:  1/12 Ct abd > Large paraumbilical hernia containing a loop of the transverse colon. Proximal colon is not dilated. Probable cholelithiasis. 2/19 CTA chest > Examination is positive for acute pulmonary embolus within the right main pulmonary artery and right upper lobar and segmental arteries. Bilateral pleural effusions and bilateral lower lobe atelectasis. Abnormal gastric and small bowel distention.  Additionally, the esophagus is dilated and fluid-filled. Findings may reflect sequelae of postoperative ileus versus bowel obstruction.  CULTURES:   ANTIBIOTICS: Periop ancef >complete 2/14  SIGNIFICANT EVENTS: 2/14 admit, open hernia repair 2/19 PE  LINES/TUBES:   ASSESSMENT / PLAN: Pulmonary embolism in setting of abd hernia repair No DVT on dopplers  - Continue heparin infusion. Transition to oral anticoagulants when cleared by surgery - Follow echo.  Chilton Greathouse MD [facility] Pager [phone] If no answer or after 3pm  call: [phone] 06/22/2016, 7:27 PM

## 2016-06-22 NOTE — Progress Notes (Signed)
ANTICOAGULATION CONSULT NOTE - Follow Up Consult  Pharmacy Consult for heparin Indication: pulmonary embolus  Allergies  Allergen Reactions  . Aspirin-Acetaminophen-Caffeine Nausea And Vomiting  . Codeine Diarrhea    Stomach cramps     Patient Measurements: Height: 5\' 4"  (162.6 cm) Weight: 279 lb 12.2 oz (126.9 kg) IBW/kg (Calculated) : 54.7 Heparin Dosing Weight: 86 kg  Vital Signs: Temp: 98.5 F (36.9 C) (02/21 0338) Temp Source: Oral (02/21 0338) BP: 131/64 (02/21 0200) Pulse Rate: 93 (02/21 0200)  Labs:  Recent Labs  06/20/16 0345 06/20/16 0931  06/20/16 1824  06/20/16 2304  06/21/16 0348 06/21/16 1114 06/21/16 1636 06/21/16 2040 06/22/16 0240  HGB  --   --   < >  --   --   --   --  10.5*  --  10.2*  --  9.6*  HCT  --   --   < >  --   --   --   --  32.5*  --  31.8*  --  30.7*  PLT  --   --   < >  --   --   --   --  359  --  317  --  314  HEPARINUNFRC  --   --   --   --   < >  --   < >  --  0.84*  --  0.63 0.28*  CREATININE 0.86  --   --   --   --   --   --  1.14*  --  1.02*  --   --   TROPONINI  --  0.05*  --  0.07*  --  0.03*  --   --   --   --   --   --   < > = values in this interval not displayed.  Estimated Creatinine Clearance: 70.6 mL/min (by C-G formula based on SCr of 1.02 mg/dL (H)).   Assessment: 66 yo F admitted 06/15/2016 s/p hernia repair, now with acute PE. Pharmacy consulted to dose heparin.   Heparin level now down to slightly subherapeutic (0.28) after dose reduction earlier. Hgb down to 9.6, no bleeding noted. Plt stable. No issues with infusion per RN.  Goal of Therapy:  Heparin level 0.3-0.7 units/ml Monitor platelets by anticoagulation protocol: Yes   Plan:  - Increase heparin to 1550 units/hr - Monitor 6 hour heparin level - Monitor daily heparin level, CBC and signs/symptoms of bleeding - Follow up plans for oral anticoagulation  Christoper Fabianaron Jalin Erpelding, PharmD, BCPS Clinical pharmacist, pager [phone] 06/22/2016,3:42 AM

## 2016-06-22 NOTE — Progress Notes (Addendum)
ANTICOAGULATION CONSULT NOTE - Follow Up Consult  Pharmacy Consult for heparin Indication: pulmonary embolus  Allergies  Allergen Reactions  . Aspirin-Acetaminophen-Caffeine Nausea And Vomiting  . Codeine Diarrhea    Stomach cramps     Patient Measurements: Height: 5\' 4"  (162.6 cm) Weight: 272 lb 11.3 oz (123.7 kg) IBW/kg (Calculated) : 54.7 Heparin Dosing Weight: 86 kg  Vital Signs: Temp: 98.8 F (37.1 C) (02/21 0807) Temp Source: Oral (02/21 0807) BP: 119/66 (02/21 1000) Pulse Rate: 96 (02/21 1000)  Labs:  Recent Labs  06/20/16 0931  06/20/16 1824  06/20/16 2304  06/21/16 0348  06/21/16 1636 06/21/16 2040 06/22/16 0240 06/22/16 1046  HGB  --   < >  --   --   --   --  10.5*  --  10.2*  --  9.6*  --   HCT  --   < >  --   --   --   --  32.5*  --  31.8*  --  30.7*  --   PLT  --   < >  --   --   --   --  359  --  317  --  314  --   HEPARINUNFRC  --   --   --   < >  --   < >  --   < >  --  0.63 0.28* 0.26*  CREATININE  --   --   --   --   --   --  1.14*  --  1.02*  --  1.05*  --   TROPONINI 0.05*  --  0.07*  --  0.03*  --   --   --   --   --   --   --   < > = values in this interval not displayed.  Estimated Creatinine Clearance: 67.5 mL/min (by C-G formula based on SCr of 1.05 mg/dL (H)).  Assessment: 66 yo F admitted 06/15/2016 s/p hernia repair, now with acute PE. Pharmacy consulted to dose heparin.   Heparin level 0.26 (subtherapeutic), Hgb 9.6, Plt wnl. Per RN patient was found with her heparin gtt unattached for a few hours, confirmed reconnection at 0830 this morning. Will check 6 hour heparin level.  Goal of Therapy:  Heparin level 0.3-0.7 units/ml Monitor platelets by anticoagulation protocol: Yes   Plan:  - Continue heparin 1550units/hr - Check 6 hr heparin level - Monitor daily heparin level, CBC and signs/symptoms of bleeding - Follow up surgery plans and ability to change to PO anticoagulation  Casilda Carlsaylor Rie Mcneil, PharmD, BCPS PGY-2 Infectious  Diseases Pharmacy Resident Pager: [phone] 06/22/2016,11:51 AM

## 2016-06-22 NOTE — Progress Notes (Signed)
7 Days Post-Op  Subjective: Pt feeling well today Ambulated this AM No BM, but states lots of flatus  Objective: Vital signs in last 24 hours: Temp:  [97.8 F (36.6 C)-100 F (37.8 C)] 98.8 F (37.1 C) (02/21 0807) Pulse Rate:  [86-107] 96 (02/21 1000) Resp:  [15-24] 23 (02/21 1000) BP: (80-131)/(49-94) 129/70 (02/21 0900) SpO2:  [96 %-100 %] 97 % (02/21 1000) Weight:  [123.7 kg (272 lb 11.3 oz)] 123.7 kg (272 lb 11.3 oz) (02/21 0400) Last BM Date: 06/15/16 (prior to surgery)  Intake/Output from previous day: 02/20 0701 - 02/21 0700 In: 3488.4 [I.V.:3223.5; IV Piggyback:264.9] Out: 2695 [Urine:1145; Emesis/NG output:1550] Intake/Output this shift: Total I/O In: 281 [I.V.:281] Out: -   General appearance: alert and cooperative Resp: clear to auscultation bilaterally GI: soft, nttp, nd, active BS  Lab Results:   Recent Labs  06/21/16 1636 06/22/16 0240  WBC 11.0* 9.6  HGB 10.2* 9.6*  HCT 31.8* 30.7*  PLT 317 314   BMET  Recent Labs  06/21/16 1636 06/22/16 0240  NA 147* 149*  K 3.8 4.0  CL 101 101  CO2 38* 39*  GLUCOSE 155* 154*  BUN 28* 25*  CREATININE 1.02* 1.05*  CALCIUM 9.2 9.1   PT/INR No results for input(s): LABPROT, INR in the last 72 hours. ABG  Recent Labs  06/20/16 1309 06/21/16 0339  PHART 7.510* 7.558*  HCO3 38.3* 45.7*    Studies/Results: Dg Abd 1 View  Result Date: 06/20/2016 CLINICAL DATA:  Post surgery EXAM: ABDOMEN - 1 VIEW COMPARISON:  06/18/2016 FINDINGS: Gaseous distended small bowel loops are noted throughout the abdomen highly suspicious for significant ileus or partial bowel obstruction. Skin staples are noted lower abdominal and pelvic wall. Metallic fusion material lumbar spine L5-S1 level. IMPRESSION: Gaseous distended small bowel loops throughout the abdomen suspicious for significant ileus or partial bowel obstruction. Postsurgical changes with skin staples lower abdominal and pelvic wall. Electronically Signed   By:  Natasha MeadLiviu  Pop M.D.   On: 06/20/2016 11:27   Ct Angio Chest Pe W Or Wo Contrast  Result Date: 06/20/2016 CLINICAL DATA:  Recent surgery. Now with shortness of breath car M 5 Gy aberrant EXAM: CT ANGIOGRAPHY CHEST WITH CONTRAST TECHNIQUE: Multidetector CT imaging of the chest was performed using the standard protocol during bolus administration of intravenous contrast. Multiplanar CT image reconstructions and MIPs were obtained to evaluate the vascular anatomy. CONTRAST:  70 cc of Isovue 370 COMPARISON:  05/13/2016 FINDINGS: Cardiovascular: There is no pericardial effusion. There is mild cardiac enlargement. Aortic atherosclerosis is identified. Calcification in the RCA and LAD coronary artery noted. There is filling defect within the distal right pulmonary artery and extending into the right upper lobar and segmental branches. Findings are compatible with acute pulmonary embolus. Mediastinum/Nodes: The trachea appears patent and is midline. There is abnormal dilatation and distention of the esophagus with fluid level. There is increase caliber of the stomach and visualized portions of the small bowel loops. No mediastinal or hilar adenopathy. No axillary or supraclavicular adenopathy. Lungs/Pleura: Small to moderate bilateral pleural effusions identified. There is atelectasis within both lower lobes. Upper Abdomen: Marked distension of the stomach. Increase caliber of the small bowel loops with air-fluid levels identified. Musculoskeletal: Mild degenerative disc disease identified within the thoracic spine. Review of the MIP images confirms the above findings. IMPRESSION: 1. Examination is positive for acute pulmonary embolus within the right main pulmonary artery and right upper lobar and segmental arteries. 2. Bilateral pleural effusions and bilateral lower  lobe atelectasis. 3. Abnormal gastric and small bowel distention. Additionally, the esophagus is dilated and fluid-filled. Findings may reflect sequelae of  postoperative ileus versus bowel obstruction. 4. Critical Value/emergent results were called by telephone at the time of interpretation on 06/20/2016 at 11:42 am to Dr. Charlean Merl, who verbally acknowledged these results. Electronically Signed   By: Signa Kell M.D.   On: 06/20/2016 11:43   Dg Chest Port 1 View  Result Date: 06/22/2016 CLINICAL DATA:  66 year old female with pulmonary embolism. EXAM: PORTABLE CHEST 1 VIEW COMPARISON:  Chest radiograph dated 06/21/2016 and CT dated 06/20/2016 FINDINGS: There has been interval placement of an enteric tube extending into the left hemiabdomen with tip beyond the inferior margin of the image. There is shallow inspiration. Stable mild enlargement of the cardiac silhouette. Left lung base density with silhouetting of the left hemidiaphragm appears similar to prior study and likely combination of small pleural effusion and atelectasis versus infiltrate. Overall there is no significant interval change in the appearance of the lungs compared to the prior radiograph. There is no pneumothorax. An air distended loop of probable small bowel in the epigastric area measures up to 5 cm in diameter. IMPRESSION: 1. No significant interval change in the appearance of the lungs compared to prior radiograph. Shallow inspiration and small left pleural effusion and left lung base atelectasis/infiltrate. 2. Interval placement of an enteric tube extending into the left hemiabdomen. A dilated air-filled loop of small bowel in the epigastric area measures up to 5 cm in diameter. Electronically Signed   By: Elgie Collard M.D.   On: 06/22/2016 06:12   Dg Chest Port 1 View  Result Date: 06/21/2016 CLINICAL DATA:  66 year old female with altered mental status. Pulmonary embolism seen on the earlier CT. EXAM: PORTABLE CHEST 1 VIEW COMPARISON:  Chest CT dated 06/20/2016 FINDINGS: There is shallow inspiration. Small bilateral pleural effusions as well as bibasilar airspace densities,  left greater right as seen on the prior CT. There is no pneumothorax. There is mild cardiomegaly. No acute osseous pathology. IMPRESSION: 1. Small bilateral pleural effusions and bibasilar subsegmental airspace densities, left greater than right. 2. Mild cardiomegaly. Electronically Signed   By: Elgie Collard M.D.   On: 06/21/2016 03:31   Dg Abd Portable 1v  Result Date: 06/21/2016 CLINICAL DATA:  Small bowel obstruction protocol. 8 hours post Gastrografin. EXAM: PORTABLE ABDOMEN - 1 VIEW COMPARISON:  None. FINDINGS: There is persistent small bowel distention. Small foci of contrast are noted in the left upper quadrant, within the stomach adjacent to the nasal/ orogastric tube. No other contrast is evident. The Gastrografin may have diluted within the small bowel. NG tube is stable, tip projecting in the region of the duodenal bulb. IMPRESSION: 1. Only a minimal amount of contrast seen on the exam, located within the mid stomach. No definitive small bowel or colonic contrast is seen. The Gastrografin may have diluted within the dilated small bowel. 2. No change in the severity of the small bowel dilation since the earlier exam. Electronically Signed   By: Amie Portland M.D.   On: 06/21/2016 20:06   Dg Abd Portable 1v  Result Date: 06/21/2016 CLINICAL DATA:  NG tube pulled out, then reinserted, evaluate position EXAM: PORTABLE ABDOMEN - 1 VIEW COMPARISON:  KUB of 06/20/2016 FINDINGS: The NG tube courses into the region of the duodenal bulb -proximal descending duodenum. There is little change in distended loops of small bowel consistent with partial small bowel obstruction. IMPRESSION: NG tube tip in  the region of the duodenal bulb/proximal descending duodenal loop. Electronically Signed   By: Dwyane Dee M.D.   On: 06/21/2016 09:31   Dg Abd Portable 1v  Result Date: 06/20/2016 CLINICAL DATA:  Nasogastric tube placement.  Initial encounter. EXAM: PORTABLE ABDOMEN - 1 VIEW COMPARISON:  Abdominal  radiograph performed earlier today at 10:47 a.m. FINDINGS: Diffuse dilatation of small-bowel loops is again noted, mildly improved from the prior study but concerning for either persistent ileus or partial small bowel obstruction. Trace air is noted within the colon. A Foley catheter is noted, with trace contrast in the bladder. Postoperative change is noted along the lower abdomen. The patient's enteric tube is noted ending overlying the body of the stomach. No acute osseous abnormalities are seen. Lumbosacral spinal fusion hardware is noted. IMPRESSION: 1. Enteric tube noted ending overlying the body of the stomach. 2. Diffuse dilatation of small-bowel loops is mildly improved from the prior study. This may reflect persistent ileus or partial small-bowel obstruction, slightly improved from the prior study. Electronically Signed   By: Roanna Raider M.D.   On: 06/20/2016 19:51    Assessment/Plan: s/p Procedure(s): OPEN VENTRAL HERNIA REPAIR (N/A) INSERTION OF ETHICON ULTRAPRO MESH (N/A) Ileus-  Improving slowly  cont' NGT-pulled back 10cm PE - con't heparin gtt  Hypokalemia -resolved ARF - improved Mobilize as tol with PT, up to chair Con't Foley for accurate I&O's Con't ICU for now   LOS: 7 days    Marigene Ehlers., Jed Limerick 06/22/2016

## 2016-06-22 NOTE — Progress Notes (Signed)
Physical Therapy Treatment Patient Details Name: Rhonda [name] MRN: [id number] DOB: 1951/04/04 Today's Date: 06/22/2016    History of Present Illness The patient is a 66 year old female who presents with an abdominal wall hernia. Now s/p hernia repair; recovery complicated by ileus;  has a past medical history of Anemia; Arthritis; Complication of anesthesia;  Hyperlipemia; Hypertension;  has a past surgical history that includes  Posterior lumbar fusion (2012)    PT Comments    Pt ambulated 120' with RW and min A, slow gait, but encouraged to be able to get out of bed and moving. Needing more assistance for bed mobility, mod A, and was initially dizzy but this cleared. VSS throughout session on RA. PT will continue to follow.    Follow Up Recommendations  Home health PT;Supervision/Assistance - 24 hour     Equipment Recommendations  Rolling walker with 5" wheels;3in1 (PT)    Recommendations for Other Services OT consult     Precautions / Restrictions Precautions Precautions: Other (comment) Precaution Comments: NG tube, Log roll for comfort with bed mobility Restrictions Weight Bearing Restrictions: No    Mobility  Bed Mobility Overal bed mobility: Needs Assistance Bed Mobility: Rolling;Sidelying to Sit Rolling: Mod assist Sidelying to sit: Mod assist       General bed mobility comments: mod A for legs off bed and trunk elevation to sitting, mod A to scoot  hips to EOB  Transfers Overall transfer level: Needs assistance Equipment used: Rolling walker (2 wheeled) Transfers: Sit to/from Stand Sit to Stand: Min guard Stand pivot transfers: Min assist       General transfer comment: slow moving but steady with sit to stand to RW. Became dizzy with initial transfer so pivoted to chair and sat until dizziness passed  Ambulation/Gait Ambulation/Gait assistance: Min assist Ambulation Distance (Feet): 120 Feet Assistive device: Rolling walker (2 wheeled) Gait  Pattern/deviations: Step-through pattern;Decreased step length - right;Decreased step length - left;Decreased stride length Gait velocity: decreased Gait velocity interpretation: Below normal speed for age/gender General Gait Details: pt with slow, steady gait, wide turns, slow reaction to obstacles. O2 sats 98% on RA   Stairs            Wheelchair Mobility    Modified Rankin (Stroke Patients Only)       Balance Overall balance assessment: Needs assistance Sitting-balance support: Feet supported Sitting balance-Leahy Scale: Fair     Standing balance support: Bilateral upper extremity supported Standing balance-Leahy Scale: Poor                      Cognition Arousal/Alertness: Awake/alert Behavior During Therapy: WFL for tasks assessed/performed Overall Cognitive Status: Within Functional Limits for tasks assessed                      Exercises General Exercises - Lower Extremity Ankle Circles/Pumps: AROM;Both;20 reps;Supine Heel Slides: AROM;Both;10 reps;Supine    General Comments        Pertinent Vitals/Pain Pain Assessment: Faces Faces Pain Scale: Hurts little more Pain Location: abdomen Pain Descriptors / Indicators: Aching Pain Intervention(s): Limited activity within patient's tolerance;Monitored during session    Home Living                      Prior Function            PT Goals (current goals can now be found in the care plan section) Acute Rehab PT Goals Patient Stated Goal:  get better PT Goal Formulation: With patient Time For Goal Achievement: 07/03/16 Potential to Achieve Goals: Good Progress towards PT goals: Progressing toward goals    Frequency    Min 3X/week      PT Plan Current plan remains appropriate    Co-evaluation             End of Session Equipment Utilized During Treatment: Gait belt Activity Tolerance: Patient tolerated treatment well Patient left: in chair;with call bell/phone  within reach;with chair alarm set Nurse Communication: Mobility status PT Visit Diagnosis: Muscle weakness (generalized) (M62.81)     Time: 0815-0901 PT Time Calculation (min) (ACUTE ONLY): 46 min  Charges:  $Gait Training: 23-37 mins $Therapeutic Activity: 8-22 mins                    G Codes:      Lyanne Co, PT  Acute Rehab Services  [phone]  Hudson L Rhonda [name] 06/22/2016, 11:15 AM

## 2016-06-22 NOTE — Progress Notes (Signed)
ANTICOAGULATION CONSULT NOTE - FOLLOW UP    HL = 0.5 (goal 0.3 - 0.7 units/mL) Heparin dosing weight = 86 kg   Assessment: 66 YOF s/p hernia repair to continue on IV heparin for acute PE.  Heparin level therapeutic; no bleeding reported.   Plan: - Continue heparin gtt at 1500 units/hr - F/U AM labs    Nthony Lefferts D. Laney Potashang, PharmD, BCPS 06/22/2016, 4:12 PM

## 2016-06-23 LAB — GLUCOSE, CAPILLARY
GLUCOSE-CAPILLARY: 136 mg/dL — AB (ref 65–99)
GLUCOSE-CAPILLARY: 125 mg/dL — AB (ref 65–99)
Glucose-Capillary: 145 mg/dL — ABNORMAL HIGH (ref 65–99)
Glucose-Capillary: 126 mg/dL — ABNORMAL HIGH (ref 65–99)
Glucose-Capillary: 96 mg/dL (ref 65–99)
Glucose-Capillary: 136 mg/dL — ABNORMAL HIGH (ref 65–99)

## 2016-06-23 LAB — CBC
HEMATOCRIT: 28 % — AB (ref 36.0–46.0)
HEMOGLOBIN: 9 g/dL — AB (ref 12.0–15.0)
MCH: 28.3 pg (ref 26.0–34.0)
MCHC: 32.1 g/dL (ref 30.0–36.0)
MCV: 88.1 fL (ref 78.0–100.0)
Platelets: 307 10*3/uL (ref 150–400)
RBC: 3.18 MIL/uL — ABNORMAL LOW (ref 3.87–5.11)
RDW: 15.2 % (ref 11.5–15.5)
WBC: 10.5 10*3/uL (ref 4.0–10.5)

## 2016-06-23 LAB — BASIC METABOLIC PANEL
ANION GAP: 9 (ref 5–15)
BUN: 18 mg/dL (ref 6–20)
CALCIUM: 8.6 mg/dL — AB (ref 8.9–10.3)
CHLORIDE: 104 mmol/L (ref 101–111)
CO2: 31 mmol/L (ref 22–32)
CREATININE: 0.92 mg/dL (ref 0.44–1.00)
GFR calc non Af Amer: >60 mL/min (ref >60)
Glucose, Bld: 151 mg/dL — ABNORMAL HIGH (ref 65–99)
Potassium: 3.7 mmol/L (ref 3.5–5.1)
SODIUM: 144 mmol/L (ref 135–145)

## 2016-06-23 LAB — HEPARIN LEVEL (UNFRACTIONATED): HEPARIN UNFRACTIONATED: 0.52 [IU]/mL (ref 0.30–0.70)

## 2016-06-23 MED ORDER — APIXABAN 5 MG PO TABS: 5.0000 mg | ORAL_TABLET | Freq: Two times a day (BID) | ORAL | Status: DC

## 2016-06-23 MED ORDER — APIXABAN 5 MG PO TABS
10.0000 mg | ORAL_TABLET | Freq: Two times a day (BID) | ORAL | Status: DC
  Filled 2016-06-23 (×2): qty 2

## 2016-06-23 MED ORDER — APIXABAN 5 MG PO TABS
10.0000 mg | ORAL_TABLET | Freq: Two times a day (BID) | ORAL | Status: DC
  Filled 2016-06-23: qty 2

## 2016-06-23 NOTE — Progress Notes (Signed)
Discussed pt's care and progress with her daughter Ernestine McmurrayRosetta Le. All questions answered.

## 2016-06-23 NOTE — Progress Notes (Signed)
Initial Nutrition Assessment  DOCUMENTATION CODES:   Morbid obesity  INTERVENTION:    Diet advancement as able per Surgery Team.  If patient does not tolerate diet advancement within next 48 hours, consider TPN.  NUTRITION DIAGNOSIS:   Inadequate oral intake related to altered GI function, nausea as evidenced by NPO status.  GOAL:   Patient will meet greater than or equal to 90% of their needs  MONITOR:   Diet advancement, PO intake, Labs, Weight trends, Skin, I & O's  ASSESSMENT:   The patient is a 66 year old female who presents with an abdominal wall hernia. patient is a 66 year old female who is a long-term follow-up for evaluation of a ventral hernia.  Patient states that she doesn't hernias gotten larger.  Patient recently had a colonoscopy was unable to have a colonoscopy completed secondary to her hernia.  S/p Procedure(s) on 06/15/16: OPEN VENTRAL HERNIA REPAIR (N/A) INSERTION OF ETHICON ULTRAPRO MESH 30cm x 30cm(N/A)  Discussed patient in ICU rounds and with RN today. Ileus resolved. NGT clamped this morning. If no N/V after 6 hours, plans to start clear liquid diet. Patient has been on a clear liquid or NPO diet since admission (8 days). Labs reviewed. CBG's: [phone]  Medications reviewed and include MVI daily.  Diet Order:  Diet NPO time specified Except for: Ice Chips  Skin:   (closed abdominal incision)  Last BM:  2/22  Height:   Ht Readings from Last 1 Encounters:  06/20/16 5\' 4"  (1.626 m)    Weight:   Wt Readings from Last 1 Encounters:  06/22/16 272 lb 11.3 oz (123.7 kg)    Ideal Body Weight:  52.2 kg  BMI:  Body mass index is 46.81 kg/m.  Estimated Nutritional Needs:   Kcal:  1800-2000  Protein:  110-120 gm  Fluid:  2 L  EDUCATION NEEDS:   Education needs addressed  Joaquin CourtsKimberly Harris, RD, LDN, CNSC Pager [phone] After Hours Pager [phone]

## 2016-06-23 NOTE — Progress Notes (Addendum)
ANTICOAGULATION CONSULT NOTE - Follow Up Consult  Pharmacy Consult for Change Heparin to Eliquis Indication: pulmonary embolus  Allergies  Allergen Reactions  . Aspirin-Acetaminophen-Caffeine Nausea And Vomiting  . Codeine Diarrhea    Stomach cramps    Patient Measurements: Height: 5\' 4"  (162.6 cm) Weight: 272 lb 11.3 oz (123.7 kg) IBW/kg (Calculated) : 54.7 Vital Signs: Temp: 98.1 F (36.7 C) (02/22 1143) Temp Source: Oral (02/22 1143) BP: 132/65 (02/22 1400) Pulse Rate: 87 (02/22 1400) Labs:  Recent Labs  06/20/16 1824  06/20/16 2304  06/21/16 1636  06/22/16 0240 06/22/16 1046 06/22/16 1455 06/23/16 0222  HGB  --   --   --   < > 10.2*  --  9.6*  --   --  9.0*  HCT  --   --   --   < > 31.8*  --  30.7*  --   --  28.0*  PLT  --   --   --   < > 317  --  314  --   --  307  HEPARINUNFRC  --   < >  --   < >  --   < > 0.28* 0.26* 0.50 0.52  CREATININE  --   --   --   < > 1.02*  --  1.05*  --   --  0.92  TROPONINI 0.07*  --  0.03*  --   --   --   --   --   --   --   < > = values in this interval not displayed.  Estimated Creatinine Clearance: 77.1 mL/min (by C-G formula based on SCr of 0.92 mg/dL).  Medications:  Infusions:  . dextrose 5 % and 0.45 % NaCl with KCl 40 mEq/L 125 mL/hr (06/23/16 1000)  . heparin 1,550 Units/hr (06/23/16 [id number])    Assessment: 66 year old female with acute pulmonary embolism on 2/19 who was initiated on IV heparin per pharmacy consult. Patient had an ileus but this appears to be resolving as she is passing stool and tolerating clears. Orders to transition heparin to oral Eliquis.    Current SCr is 0.92 with CrCl ~77 mL/min. H/H with slight trend down. Platelets remain stable. No signs of bleeding noted.   Goal of Therapy:  Monitor platelets by anticoagulation protocol: Yes   Plan:  Discontinue heparin at the time of Eliquis administration. Start Eliquis 10 mg po BID x 7 days- start this PM at 2200 PM, followed by 5 mg po BID. Monitor  for signs and symptoms of bleeding  Link SnufferJessica Holly Iannaccone, PharmD, BCPS Clinical Pharmacist Clinical phone 06/23/2016 until 3:30 PM - [phone] After hours, please call [phone] 06/23/2016,3:14 PM

## 2016-06-23 NOTE — Progress Notes (Signed)
PULMONARY / CRITICAL CARE MEDICINE   Name: Rhonda [name] MRN: [id number] DOB: 13-Dec-1950    ADMISSION DATE:  06/15/2016 CONSULTATION DATE:  06/21/2016  REFERRING MD:  Dr. Derrell Lolling CCS  CHIEF COMPLAINT:  PE  HISTORY OF PRESENT ILLNESS:   66 year old female with past medical history as below, which is significant for hypertension, hyperlipidemia, diabetes mellitus, and GERD. She also has long-standing history of ventral hernia. Recently she tended to have a colonoscopy which could not be completed due to her hernia. On 2/14 she presented to [facility] for elective open ventral hernia repair. The procedure was without complication, however, her hospital course has been complicated by postoperative ileus, delirium, and pulmonary embolism. For these reasons she is being managed by the surgery service in the intensive care unit. PCCM has been asked to see for further evaluation and medical management.  PAST MEDICAL HISTORY :  She  has a past medical history of Anemia; Arthritis; Complication of anesthesia; GERD (gastroesophageal reflux disease); Hyperlipemia; Hypertension; Sinus congestion; Sinus headache; and Thyroid nodule (2010).  PAST SURGICAL HISTORY: She  has a past surgical history that includes Cesarean section (1980; 1988); Tonsillectomy; Colonoscopy (03/31/2016); Hernia repair; Abdominal hernia repair (06/15/2016); Posterior lumbar fusion (2012); Back surgery; Fracture surgery; Patella fracture surgery (Left, 1984); Tubal ligation (1988); Ventral hernia repair (N/A, 06/15/2016); and Insertion of mesh (N/A, 06/15/2016).  Allergies  Allergen Reactions  . Aspirin-Acetaminophen-Caffeine Nausea And Vomiting  . Codeine Diarrhea    Stomach cramps     No current facility-administered medications on file prior to encounter.    Current Outpatient Prescriptions on File Prior to Encounter  Medication Sig  . dorzolamide-timolol (COSOPT) 22.3-6.8 MG/ML ophthalmic solution Place 1 drop  into both eyes 2 (two) times daily.   Marland Kitchen ezetimibe (ZETIA) 10 MG tablet Take 10 mg by mouth every evening.   . ranitidine (ZANTAC) 300 MG capsule Take 300 mg by mouth every evening.  . valsartan-hydrochlorothiazide (DIOVAN-HCT) 320-25 MG tablet Take 1 tablet by mouth daily.   . fluticasone (FLONASE) 50 MCG/ACT nasal spray Use 2 sprays in each nostril once daily for stuffy nose or drainage.    FAMILY HISTORY:  Her has no family status information on file.    SOCIAL HISTORY: She  reports that she has never smoked. She has never used smokeless tobacco. She reports that she does not drink alcohol or use drugs.  REVIEW OF SYSTEMS:   Bolds are positive  Constitutional: weight loss, gain, night sweats, Fevers, chills, fatigue .  HEENT: headaches, Sore throat, sneezing, nasal congestion, post nasal drip, Difficulty swallowing, Tooth/dental problems, visual complaints visual changes, ear ache CV:  chest pain, radiates:,Orthopnea, PND, swelling in lower extremities, dizziness, palpitations, syncope.  GI  heartburn, indigestion, abdominal pain, nausea, vomiting, diarrhea, change in bowel habits, loss of appetite, bloody stools.  Resp:Denies any cough, dyspnea, wheezing, sputum production. Skin: rash or itching or icterus GU: dysuria, change in color of urine, urgency or frequency. flank pain, hematuria  MS: joint pain or swelling. decreased range of motion  Psych: change in mood or affect. depression or anxiety.  Neuro: difficulty with speech, weakness, numbness, ataxia   SUBJECTIVE:  No new issues She continues to do well from a respiratory, hemodynamic standpoint. Stable on room air.  VITAL SIGNS: BP (!) 164/124   Pulse (!) 115   Temp 98.1 F (36.7 C) (Oral)   Resp 20   Ht 5\' 4"  (1.626 m)   Wt 272 lb 11.3 oz (123.7 kg)   SpO2  100%   BMI 46.81 kg/m   HEMODYNAMICS:    VENTILATOR SETTINGS:    INTAKE / OUTPUT: I/O last 3 completed shifts: In: 4504 [I.V.:4504] Out: 2280  [Urine:930; Emesis/NG output:1350]  PHYSICAL EXAMINATION: General:  Elderly female in no distress Neuro: No focal deficits HEENT:  No thyromegaly, JVD Cardiovascular:  Regular rate and rhythm, no murmurs rubs gallops Lungs:  Clear, no wheeze, crackles Abdomen:  Soft, positive bowel sounds Musculoskeletal: Normal tone and bulk Skin:  No rash.  LABS:  BMET  Recent Labs Lab 06/21/16 1636 06/22/16 0240 06/23/16 0222  NA 147* 149* 144  K 3.8 4.0 3.7  CL 101 101 104  CO2 38* 39* 31  BUN 28* 25* 18  CREATININE 1.02* 1.05* 0.92  GLUCOSE 155* 154* 151*    Electrolytes  Recent Labs Lab 06/17/16 0537  06/21/16 0348 06/21/16 1636 06/22/16 0240 06/23/16 0222  CALCIUM 8.9  < > 9.7 9.2 9.1 8.6*  MG 1.8  --  2.2  --   --   --   < > = values in this interval not displayed.  CBC  Recent Labs Lab 06/21/16 1636 06/22/16 0240 06/23/16 0222  WBC 11.0* 9.6 10.5  HGB 10.2* 9.6* 9.0*  HCT 31.8* 30.7* 28.0*  PLT 317 314 307    Coag's No results for input(s): APTT, INR in the last 168 hours.  Sepsis Markers  Recent Labs Lab 06/20/16 1022 06/20/16 1824 06/22/16 0240  LATICACIDVEN 3.0* 2.0* 1.3    ABG  Recent Labs Lab 06/20/16 1309 06/21/16 0339  PHART 7.510* 7.558*  PCO2ART 47.7 51.3*  PO2ART 46.0* 62.0*    Liver Enzymes No results for input(s): AST, ALT, ALKPHOS, BILITOT, ALBUMIN in the last 168 hours.  Cardiac Enzymes  Recent Labs Lab 06/20/16 0931 06/20/16 1824 06/20/16 2304  TROPONINI 0.05* 0.07* 0.03*    Glucose  Recent Labs Lab 06/22/16 1525 06/22/16 1935 06/22/16 2335 06/23/16 0339 06/23/16 0805 06/23/16 1141  GLUCAP 105* 132* 144* 145* 136* 126*    Imaging Dg Abd Portable 1v  Result Date: 06/22/2016 CLINICAL DATA:  Followup small bowel obstructs EXAM: PORTABLE ABDOMEN - 1 VIEW COMPARISON:  06/21/2016 FINDINGS: The nasogastric tube tip is in the distal body of stomach with side port well below GE junction. Dilated loops of small  bowel within the upper abdomen are again noted measuring up to 5.3 cm. Skin staples noted along the midline of the abdomen and pelvis. IMPRESSION: 1. Persistent abnormal small bowel dilatation compatible with bowel obstruction. Electronically Signed   By: Signa Kellaylor  Stroud M.D.   On: 06/22/2016 20:16     STUDIES:  1/12 Ct abd > Large paraumbilical hernia containing a loop of the transverse colon. Proximal colon is not dilated. Probable cholelithiasis. 2/19 CTA chest > Examination is positive for acute pulmonary embolus within the right main pulmonary artery and right upper lobar and segmental arteries. Bilateral pleural effusions and bilateral lower lobe atelectasis. Abnormal gastric and small bowel distention. Additionally, the esophagus is dilated and fluid-filled. Findings may reflect sequelae of postoperative ileus versus bowel obstruction. Echo 2/21 > Normal LV size with EF 60-65%. Normal RV size and systolic   function. No significant valvular abnormalities. Mild pulmonary   hypertension.  CULTURES:   ANTIBIOTICS: Periop ancef >complete 2/14  SIGNIFICANT EVENTS: 2/14 admit, open hernia repair 2/19 PE  LINES/TUBES:   ASSESSMENT / PLAN: Pulmonary embolism in setting of abd hernia repair No DVT on dopplers Echo does not show RV dysfunction  - Transition to eliquis  today. Stable for transfer from ICU.  PCCM will be available as needed. Please call with any questions  Chilton Greathouse MD [facility] Pager [phone] If no answer or after 3pm call: [phone] 06/23/2016, 12:10 PM

## 2016-06-23 NOTE — Progress Notes (Signed)
Pt reports feeling "wheezy" relieved with nebulizer tx. pt also reports feeling nauseated relieved with reglan ivp, no change in soft abdomen with distention, ng conts to drain via intermittent suction

## 2016-06-23 NOTE — Progress Notes (Signed)
8 Days Post-Op  Subjective: PT with BM x 3 Feels much better  Objective: Vital signs in last 24 hours: Temp:  [97.6 F (36.4 C)-100.1 F (37.8 C)] 100.1 F (37.8 C) (02/22 0337) Pulse Rate:  [87-104] 91 (02/22 0700) Resp:  [18-34] 20 (02/22 0700) BP: (118-138)/(62-91) 127/66 (02/22 0700) SpO2:  [96 %-100 %] 100 % (02/22 0700) Last BM Date: 06/22/16  Intake/Output from previous day: 02/21 0701 - 02/22 0700 In: 2810 [I.V.:2810] Out: 1580 [Urine:530; Emesis/NG output:1050] Intake/Output this shift: No intake/output data recorded.  General appearance: alert and cooperative Resp: clear to auscultation bilaterally Cardio: regular rate and rhythm, S1, S2 normal, no murmur, click, rub or gallop GI: soft, non-tender; bowel sounds normal; no masses,  no organomegaly  Lab Results:   Recent Labs  06/22/16 0240 06/23/16 0222  WBC 9.6 10.5  HGB 9.6* 9.0*  HCT 30.7* 28.0*  PLT 314 307   BMET  Recent Labs  06/22/16 0240 06/23/16 0222  NA 149* 144  K 4.0 3.7  CL 101 104  CO2 39* 31  GLUCOSE 154* 151*  BUN 25* 18  CREATININE 1.05* 0.92  CALCIUM 9.1 8.6*   PT/INR No results for input(s): LABPROT, INR in the last 72 hours. ABG  Recent Labs  06/20/16 1309 06/21/16 0339  PHART 7.510* 7.558*  HCO3 38.3* 45.7*    Studies/Results: Dg Chest Port 1 View  Result Date: 06/22/2016 CLINICAL DATA:  66 year old female with pulmonary embolism. EXAM: PORTABLE CHEST 1 VIEW COMPARISON:  Chest radiograph dated 06/21/2016 and CT dated 06/20/2016 FINDINGS: There has been interval placement of an enteric tube extending into the left hemiabdomen with tip beyond the inferior margin of the image. There is shallow inspiration. Stable mild enlargement of the cardiac silhouette. Left lung base density with silhouetting of the left hemidiaphragm appears similar to prior study and likely combination of small pleural effusion and atelectasis versus infiltrate. Overall there is no significant  interval change in the appearance of the lungs compared to the prior radiograph. There is no pneumothorax. An air distended loop of probable small bowel in the epigastric area measures up to 5 cm in diameter. IMPRESSION: 1. No significant interval change in the appearance of the lungs compared to prior radiograph. Shallow inspiration and small left pleural effusion and left lung base atelectasis/infiltrate. 2. Interval placement of an enteric tube extending into the left hemiabdomen. A dilated air-filled loop of small bowel in the epigastric area measures up to 5 cm in diameter. Electronically Signed   By: Elgie CollardArash  Radparvar M.D.   On: 06/22/2016 06:12   Dg Abd Portable 1v  Result Date: 06/22/2016 CLINICAL DATA:  Followup small bowel obstructs EXAM: PORTABLE ABDOMEN - 1 VIEW COMPARISON:  06/21/2016 FINDINGS: The nasogastric tube tip is in the distal body of stomach with side port well below GE junction. Dilated loops of small bowel within the upper abdomen are again noted measuring up to 5.3 cm. Skin staples noted along the midline of the abdomen and pelvis. IMPRESSION: 1. Persistent abnormal small bowel dilatation compatible with bowel obstruction. Electronically Signed   By: Signa Kellaylor  Stroud M.D.   On: 06/22/2016 20:16   Dg Abd Portable 1v  Result Date: 06/21/2016 CLINICAL DATA:  Small bowel obstruction protocol. 8 hours post Gastrografin. EXAM: PORTABLE ABDOMEN - 1 VIEW COMPARISON:  None. FINDINGS: There is persistent small bowel distention. Small foci of contrast are noted in the left upper quadrant, within the stomach adjacent to the nasal/ orogastric tube. No other contrast is  evident. The Gastrografin may have diluted within the small bowel. NG tube is stable, tip projecting in the region of the duodenal bulb. IMPRESSION: 1. Only a minimal amount of contrast seen on the exam, located within the mid stomach. No definitive small bowel or colonic contrast is seen. The Gastrografin may have diluted within the  dilated small bowel. 2. No change in the severity of the small bowel dilation since the earlier exam. Electronically Signed   By: Amie Portland M.D.   On: 06/21/2016 20:06   Dg Abd Portable 1v  Result Date: 06/21/2016 CLINICAL DATA:  NG tube pulled out, then reinserted, evaluate position EXAM: PORTABLE ABDOMEN - 1 VIEW COMPARISON:  KUB of 06/20/2016 FINDINGS: The NG tube courses into the region of the duodenal bulb -proximal descending duodenum. There is little change in distended loops of small bowel consistent with partial small bowel obstruction. IMPRESSION: NG tube tip in the region of the duodenal bulb/proximal descending duodenal loop. Electronically Signed   By: Dwyane Dee M.D.   On: 06/21/2016 09:31    Anti-infectives: Anti-infectives    Start     Dose/Rate Route Frequency Ordered Stop   06/15/16 0800  ceFAZolin (ANCEF) 3 g in dextrose 5 % 50 mL IVPB     3 g 130 mL/hr over 30 Minutes Intravenous On call to O.R. 06/14/16 1022 06/15/16 0840      Assessment/Plan: s/p Procedure(s): OPEN VENTRAL HERNIA REPAIR (N/A) INSERTION OF ETHICON ULTRAPRO MESH (N/A) Ileus-  Resolved, will clamp NGT x 6 hrs if no n/v will DC and start CLD PE - con't heparin gtt, OK with transition to PO anticoagulation Hypokalemia -resolved ARF - resolved Mobilize as tol with PT, up to chair Transfer to SDU  LOS: 8 days    Marigene Ehlers., Jed Limerick 06/23/2016

## 2016-06-24 ENCOUNTER — Inpatient Hospital Stay (HOSPITAL_COMMUNITY): Payer: Medicare Other

## 2016-06-24 LAB — BASIC METABOLIC PANEL
Anion gap: 8 (ref 5–15)
BUN: 16 mg/dL (ref 6–20)
CHLORIDE: 106 mmol/L (ref 101–111)
CO2: 27 mmol/L (ref 22–32)
Calcium: 8.3 mg/dL — ABNORMAL LOW (ref 8.9–10.3)
Creatinine, Ser: 0.83 mg/dL (ref 0.44–1.00)
GFR calc Af Amer: >60 mL/min (ref >60)
GLUCOSE: 134 mg/dL — AB (ref 65–99)
POTASSIUM: 3.6 mmol/L (ref 3.5–5.1)
Sodium: 141 mmol/L (ref 135–145)

## 2016-06-24 LAB — CBC
HCT: 29.5 % — ABNORMAL LOW (ref 36.0–46.0)
Hemoglobin: 9.4 g/dL — ABNORMAL LOW (ref 12.0–15.0)
MCH: 28 pg (ref 26.0–34.0)
MCHC: 31.9 g/dL (ref 30.0–36.0)
MCV: 87.8 fL (ref 78.0–100.0)
PLATELETS: 340 10*3/uL (ref 150–400)
RBC: 3.36 MIL/uL — AB (ref 3.87–5.11)
RDW: 15.1 % (ref 11.5–15.5)
WBC: 12.4 10*3/uL — ABNORMAL HIGH (ref 4.0–10.5)

## 2016-06-24 LAB — GLUCOSE, CAPILLARY
GLUCOSE-CAPILLARY: 103 mg/dL — AB (ref 65–99)
GLUCOSE-CAPILLARY: 117 mg/dL — AB (ref 65–99)
GLUCOSE-CAPILLARY: 133 mg/dL — AB (ref 65–99)
GLUCOSE-CAPILLARY: 94 mg/dL (ref 65–99)
Glucose-Capillary: 116 mg/dL — ABNORMAL HIGH (ref 65–99)
Glucose-Capillary: 126 mg/dL — ABNORMAL HIGH (ref 65–99)

## 2016-06-24 LAB — HEPARIN LEVEL (UNFRACTIONATED): Heparin Unfractionated: 0.48 IU/mL (ref 0.30–0.70)

## 2016-06-24 MED ORDER — BOOST / RESOURCE BREEZE PO LIQD
1.0000 | Freq: Three times a day (TID) | ORAL | Status: DC
  Administered 2016-06-24 – 2016-06-26 (×6): 1 via ORAL

## 2016-06-24 MED ORDER — APIXABAN 5 MG PO TABS
10.0000 mg | ORAL_TABLET | Freq: Two times a day (BID) | ORAL | Status: DC
  Administered 2016-06-24 – 2016-06-28 (×9): 10 mg via ORAL
  Filled 2016-06-24 (×9): qty 2

## 2016-06-24 MED ORDER — APIXABAN 5 MG PO TABS: 5.0000 mg | ORAL_TABLET | Freq: Two times a day (BID) | ORAL | Status: DC

## 2016-06-24 MED ORDER — SODIUM CHLORIDE 0.9 % IV SOLN
30.0000 meq | Freq: Once | INTRAVENOUS | Status: AC
  Administered 2016-06-24: 30 meq via INTRAVENOUS
  Filled 2016-06-24: qty 15

## 2016-06-24 MED ORDER — HEPARIN (PORCINE) IN NACL 100-0.45 UNIT/ML-% IJ SOLN
1550.0000 [IU]/h | INTRAMUSCULAR | Status: DC
  Filled 2016-06-24: qty 250

## 2016-06-24 NOTE — Progress Notes (Signed)
Patient has PE and on heparin gtt.  Tried to transition her to taking Eliquis PO but patient vomited her dinner.  Will keep her on heparin gtt and revaluate PO Eliquis in AM per Dr. Donell BeersByerly.

## 2016-06-24 NOTE — Progress Notes (Signed)
Initial Nutrition Assessment  DOCUMENTATION CODES:   Morbid obesity  INTERVENTION:    Boost Breeze po TID, each supplement provides 250 kcal and 9 grams of protein  NUTRITION DIAGNOSIS:   Inadequate oral intake related to altered GI function, nausea as evidenced by other (see comment) (diet just advanced to clear liquids). Ongoing  GOAL:   Patient will meet greater than or equal to 90% of their needs Unmet  MONITOR:   Diet advancement, PO intake, Labs, Weight trends, Skin, I & O's  ASSESSMENT:   The patient is a 66 year old female who presents with an abdominal wall hernia. patient is a 66 year old female who is a long-term follow-up for evaluation of a ventral hernia.  Patient states that she doesn't hernias gotten larger.  Patient recently had a colonoscopy was unable to have a colonoscopy completed secondary to her hernia.  S/p Procedure(s) on 06/15/16: OPEN VENTRAL HERNIA REPAIR (N/A) INSERTION OF ETHICON ULTRAPRO MESH 30cm x 30cm(N/A)  Diet advanced to clear liquids yesterday. RN reports patient is afraid to take much PO, intake is poor. Labs reviewed. CBG's: 133-126-94 Medications reviewed and include MVI and Reglan.  Diet Order:  Diet clear liquid Room service appropriate? Yes; Fluid consistency: Thin  Skin:   (closed abdominal incision)  Last BM:  2/23  Height:   Ht Readings from Last 1 Encounters:  06/20/16 5\' 4"  (1.626 m)    Weight:   Wt Readings from Last 1 Encounters:  06/22/16 272 lb 11.3 oz (123.7 kg)    Ideal Body Weight:  52.2 kg  BMI:  Body mass index is 46.81 kg/m.  Estimated Nutritional Needs:   Kcal:  1800-2000  Protein:  110-120 gm  Fluid:  2 L  EDUCATION NEEDS:   Education needs addressed  Joaquin CourtsKimberly Tyon Cerasoli, RD, LDN, CNSC Pager [phone] After Hours Pager [phone]

## 2016-06-24 NOTE — Care Management Note (Signed)
Case Management Note  Patient Details  Name: Ronaldo MiyamotoLuretta M [name] MRN: [id number] Date of Birth: 27-Sep-1950  Subjective/Objective:    Pt is post op hernia repair - noted to have PE and ileus  transferred to ICU                Action/Plan:  PTA from home.  PT to reassess   Expected Discharge Date:                  Expected Discharge Plan:  Home w Home Health Services  In-House Referral:     Discharge planning Services  CM Consult  Post Acute Care Choice:    Choice offered to:     DME Arranged:    DME Agency:     HH Arranged:    HH Agency:     Status of Service:  In process, will continue to follow  If discussed at Long Length of Stay Meetings, dates discussed:    Additional Comments: Discussed in LOS 06/23/16 - pt remains appropriate for continued stay.  Pt continues to have NG tube but is now on clear liquid diet - if unable to tolerate may need TPA via PICC.    06/22/16 Pt alert and oriented- daughter at bedside.  Pt states she is completely independent and cares for her 67 year old mother.  Pt [facility] HHRN and Aide [facility], pt is in agreement with HHPT - pt [facility]  CM will request order via physician sticky tab.  Daughters are working on the plan to provide 24 hour supervision as pt declined SNF. Cherylann ParrClaxton, Nicki Furlan S, RN 06/24/2016, 9:32 AM

## 2016-06-24 NOTE — Progress Notes (Signed)
PT Cancellation Note  Patient Details Name: Rhonda MiyamotoLuretta M [name] MRN: [id number] DOB: 01/15/1951   Cancelled Treatment:    Reason Eval/Treat Not Completed: Fatigue/lethargy limiting ability to participate.  Pt indicates too fatigued for PT at this time.  Will f/u as appropriate.     Alison MurrayMegan F Taliyah Watrous, [place] CarolinaPT [phone] 06/24/2016, 2:16 PM

## 2016-06-24 NOTE — Progress Notes (Signed)
9 Days Post-Op  Subjective: Pt with BM's.  Some n/v overnight after trying CLD NGT in place  Objective: Vital signs in last 24 hours: Temp:  [98.1 F (36.7 C)-99.7 F (37.6 C)] 98.2 F (36.8 C) (02/23 0751) Pulse Rate:  [85-116] 93 (02/23 0700) Resp:  [18-30] 22 (02/23 0700) BP: (132-164)/(58-124) 157/66 (02/23 0700) SpO2:  [95 %-100 %] 95 % (02/23 0700) Last BM Date: 06/23/16  Intake/Output from previous day: 02/22 0701 - 02/23 0700 In: 3091 [I.V.:3091] Out: 1 [Urine:1] Intake/Output this shift: No intake/output data recorded.  General appearance: alert and cooperative GI: soft, non-tender; bowel sounds normal; no masses,  no organomegaly  Lab Results:   Recent Labs  06/23/16 0222 06/24/16 0225  WBC 10.5 12.4*  HGB 9.0* 9.4*  HCT 28.0* 29.5*  PLT 307 340   BMET  Recent Labs  06/23/16 0222 06/24/16 0225  NA 144 141  K 3.7 3.6  CL 104 106  CO2 31 27  GLUCOSE 151* 134*  BUN 18 16  CREATININE 0.92 0.83  CALCIUM 8.6* 8.3*   PT/INR No results for input(s): LABPROT, INR in the last 72 hours. ABG No results for input(s): PHART, HCO3 in the last 72 hours.  Invalid input(s): PCO2, PO2  Studies/Results: Dg Abd Portable 1v  Result Date: 06/24/2016 CLINICAL DATA:  Ileus. EXAM: PORTABLE ABDOMEN - 1 VIEW COMPARISON:  June 22, 2016 FINDINGS: Nasogastric tube tip and side port in stomach. There are several loops of dilated small bowel without air-fluid levels evident. No free air. There is postoperative change in the lower lumbar and upper sacral regions. Skin staples are noted in the lower and pelvis. IMPRESSION: Loops of dilated small bowel remain without appreciable change. Suspect postoperative ileus. No free air. Nasogastric tube tip and side port in stomach. Electronically Signed   By: Bretta BangWilliam  Woodruff III M.D.   On: 06/24/2016 08:19   Dg Abd Portable 1v  Result Date: 06/22/2016 CLINICAL DATA:  Followup small bowel obstructs EXAM: PORTABLE ABDOMEN - 1  VIEW COMPARISON:  06/21/2016 FINDINGS: The nasogastric tube tip is in the distal body of stomach with side port well below GE junction. Dilated loops of small bowel within the upper abdomen are again noted measuring up to 5.3 cm. Skin staples noted along the midline of the abdomen and pelvis. IMPRESSION: 1. Persistent abnormal small bowel dilatation compatible with bowel obstruction. Electronically Signed   By: Signa Kellaylor  Stroud M.D.   On: 06/22/2016 20:16    Anti-infectives: Anti-infectives    Start     Dose/Rate Route Frequency Ordered Stop   06/15/16 0800  ceFAZolin (ANCEF) 3 g in dextrose 5 % 50 mL IVPB     3 g 130 mL/hr over 30 Minutes Intravenous On call to O.R. 06/14/16 1022 06/15/16 0840      Assessment/Plan: s/p Procedure(s): OPEN VENTRAL HERNIA REPAIR (N/A) INSERTION OF ETHICON ULTRAPRO MESH (N/A) con't trial of CLD, if tol DC NGT and bring along slowly.  If pt unable to tol CLD in next few days, may require PICC /TNA Mobilize Con't elliquis SDU order in place K replacement   LOS: 9 days    Marigene EhlersRamirez Jr., Jed LimerickArmando 06/24/2016

## 2016-06-25 LAB — GLUCOSE, CAPILLARY
GLUCOSE-CAPILLARY: 131 mg/dL — AB (ref 65–99)
GLUCOSE-CAPILLARY: 109 mg/dL — AB (ref 65–99)
GLUCOSE-CAPILLARY: 121 mg/dL — AB (ref 65–99)
GLUCOSE-CAPILLARY: 131 mg/dL — AB (ref 65–99)
Glucose-Capillary: 108 mg/dL — ABNORMAL HIGH (ref 65–99)
Glucose-Capillary: 125 mg/dL — ABNORMAL HIGH (ref 65–99)

## 2016-06-25 LAB — CBC
HEMATOCRIT: 28 % — AB (ref 36.0–46.0)
HEMOGLOBIN: 9.1 g/dL — AB (ref 12.0–15.0)
MCH: 28.3 pg (ref 26.0–34.0)
MCHC: 32.5 g/dL (ref 30.0–36.0)
MCV: 87.2 fL (ref 78.0–100.0)
Platelets: 324 10*3/uL (ref 150–400)
RBC: 3.21 MIL/uL — ABNORMAL LOW (ref 3.87–5.11)
RDW: 14.9 % (ref 11.5–15.5)
WBC: 10.2 10*3/uL (ref 4.0–10.5)

## 2016-06-25 LAB — BASIC METABOLIC PANEL
ANION GAP: 7 (ref 5–15)
BUN: 11 mg/dL (ref 6–20)
CALCIUM: 8 mg/dL — AB (ref 8.9–10.3)
CO2: 24 mmol/L (ref 22–32)
Chloride: 110 mmol/L (ref 101–111)
Creatinine, Ser: 0.78 mg/dL (ref 0.44–1.00)
GFR calc non Af Amer: >60 mL/min (ref >60)
Glucose, Bld: 130 mg/dL — ABNORMAL HIGH (ref 65–99)
Potassium: 3.8 mmol/L (ref 3.5–5.1)
SODIUM: 141 mmol/L (ref 135–145)

## 2016-06-25 NOTE — Progress Notes (Signed)
CCS/Anas Rhonda [name] Progress Note 10 Days Post-Op  Subjective: Patient looks completely exhausted.  Vomited large amounts twice last night.  Only on clear liquids  Objective: Vital signs in last 24 hours: Temp:  [97.8 F (36.6 C)-99 F (37.2 C)] 98.6 F (37 C) (02/24 0807) Pulse Rate:  [87-99] 89 (02/24 0414) Resp:  [18-39] 36 (02/24 0414) BP: (110-167)/(54-83) 142/65 (02/24 0414) SpO2:  [96 %-100 %] 100 % (02/24 0414) Last BM Date: 06/24/16  Intake/Output from previous day: 02/23 0701 - 02/24 0700 In: 3359 [P.O.:50; I.V.:3034.1; IV Piggyback:264.9] Out: -  Intake/Output this shift: No intake/output data recorded.  General: Seems worn out.  Not very hungry.  Lungs: Clear.  Sats are good on room air  Abd: Soft, distended, good bowel sounds.  Passing gas and having flatus also.  Extremities: No clinical signs of DVT, but patient being treated with Eliquis for PE  Neuro: Intact  Lab Results:  @LABLAST2 (wbc:2,hgb:2,hct:2,plt:2) BMET ) Recent Labs  06/24/16 0225 06/25/16 0419  NA 141 141  K 3.6 3.8  CL 106 110  CO2 27 24  GLUCOSE 134* 130*  BUN 16 11  CREATININE 0.83 0.78  CALCIUM 8.3* 8.0*   PT/INR No results for input(s): LABPROT, INR in the last 72 hours. ABG No results for input(s): PHART, HCO3 in the last 72 hours.  Invalid input(s): PCO2, PO2  Studies/Results: Dg Abd Portable 1v  Result Date: 06/24/2016 CLINICAL DATA:  Ileus. EXAM: PORTABLE ABDOMEN - 1 VIEW COMPARISON:  June 22, 2016 FINDINGS: Nasogastric tube tip and side port in stomach. There are several loops of dilated small bowel without air-fluid levels evident. No free air. There is postoperative change in the lower lumbar and upper sacral regions. Skin staples are noted in the lower and pelvis. IMPRESSION: Loops of dilated small bowel remain without appreciable change. Suspect postoperative ileus. No free air. Nasogastric tube tip and side port in stomach. Electronically Signed   By: Bretta BangWilliam  Woodruff  III M.D.   On: 06/24/2016 08:19    Anti-infectives: Anti-infectives    Start     Dose/Rate Route Frequency Ordered Stop   06/15/16 0800  ceFAZolin (ANCEF) 3 g in dextrose 5 % 50 mL IVPB     3 g 130 mL/hr over 30 Minutes Intravenous On call to O.R. 06/14/16 1022 06/15/16 0840      Assessment/Plan: s/p Procedure(s): OPEN VENTRAL HERNIA REPAIR INSERTION OF ETHICON ULTRAPRO MESH Will not advance diet  Hopefully will not need NGT again, but told patient to go slowly on liquids.  LOS: 10 days   Marta LamasJames O. Gae [name], III, MD, FACS [phone] [phone]--office [name] 06/25/2016

## 2016-06-26 LAB — BASIC METABOLIC PANEL
ANION GAP: 9 (ref 5–15)
BUN: 5 mg/dL — AB (ref 6–20)
CHLORIDE: 106 mmol/L (ref 101–111)
CO2: 23 mmol/L (ref 22–32)
Calcium: 8 mg/dL — ABNORMAL LOW (ref 8.9–10.3)
Creatinine, Ser: 0.7 mg/dL (ref 0.44–1.00)
GFR calc Af Amer: >60 mL/min (ref >60)
GLUCOSE: 112 mg/dL — AB (ref 65–99)
POTASSIUM: 3.9 mmol/L (ref 3.5–5.1)
Sodium: 138 mmol/L (ref 135–145)

## 2016-06-26 LAB — GLUCOSE, CAPILLARY
GLUCOSE-CAPILLARY: 125 mg/dL — AB (ref 65–99)
GLUCOSE-CAPILLARY: 109 mg/dL — AB (ref 65–99)
GLUCOSE-CAPILLARY: 107 mg/dL — AB (ref 65–99)
Glucose-Capillary: 130 mg/dL — ABNORMAL HIGH (ref 65–99)
Glucose-Capillary: 111 mg/dL — ABNORMAL HIGH (ref 65–99)
Glucose-Capillary: 122 mg/dL — ABNORMAL HIGH (ref 65–99)

## 2016-06-26 LAB — CBC
HEMATOCRIT: 27.7 % — AB (ref 36.0–46.0)
HEMOGLOBIN: 9.1 g/dL — AB (ref 12.0–15.0)
MCH: 28.4 pg (ref 26.0–34.0)
MCHC: 32.9 g/dL (ref 30.0–36.0)
MCV: 86.6 fL (ref 78.0–100.0)
Platelets: 257 10*3/uL (ref 150–400)
RBC: 3.2 MIL/uL — ABNORMAL LOW (ref 3.87–5.11)
RDW: 14.9 % (ref 11.5–15.5)
WBC: 10.8 10*3/uL — AB (ref 4.0–10.5)

## 2016-06-26 MED ORDER — HYDROCHLOROTHIAZIDE 25 MG PO TABS
25.0000 mg | ORAL_TABLET | Freq: Every day | ORAL | Status: DC
  Administered 2016-06-26 – 2016-06-28 (×3): 25 mg via ORAL
  Filled 2016-06-26 (×3): qty 1

## 2016-06-26 MED ORDER — IRBESARTAN 300 MG PO TABS
300.0000 mg | ORAL_TABLET | Freq: Every day | ORAL | Status: DC
  Administered 2016-06-26 – 2016-06-28 (×3): 300 mg via ORAL
  Filled 2016-06-26 (×3): qty 1

## 2016-06-26 MED ORDER — VALSARTAN-HYDROCHLOROTHIAZIDE 320-25 MG PO TABS: 1.0000 | ORAL_TABLET | Freq: Every day | ORAL | Status: DC

## 2016-06-26 NOTE — Progress Notes (Signed)
Rhonda [name] Progress Note 11 Days Post-Op  Subjective: No vomiting all day yesterday, but appetite still not great.  She thinks that the clear liquid diet is part the reason. Small left flank red area measuring 4x6 cm, not indurated and looks like a macular rash a bit.  Completely asymptomatic. Objective: Vital signs in last 24 hours: Temp:  [97.6 F (36.4 C)-98.5 F (36.9 C)] 98.5 F (36.9 C) (02/25 0800) Pulse Rate:  [83-93] 88 (02/25 0802) Resp:  [15-27] 24 (02/25 0802) BP: (137-160)/(62-83) 151/77 (02/25 0802) SpO2:  [97 %-99 %] 99 % (02/25 0802) Last BM Date: 06/25/16  Intake/Output from previous day: 02/24 0701 - 02/25 0700 In: 2672.9 [I.V.:2672.9] Out: -  Intake/Output this shift: Total I/O In: 120 [P.O.:120] Out: -   General: No acute distress.    Lungs: Clear   Abd: Good bowel sounds.  Not tender.  Wants to try to take some toast or solid foods  Having diarrhea everytime she has urination.  Extremities: No changes  Neuro: Intact  Lab Results:  @LABLAST2 (wbc:2,hgb:2,hct:2,plt:2) BMET ) Recent Labs  06/25/16 0419 06/26/16 0222  NA 141 138  K 3.8 3.9  CL 110 106  CO2 24 23  GLUCOSE 130* 112*  BUN 11 5*  CREATININE 0.78 0.70  CALCIUM 8.0* 8.0*   PT/INR No results for input(s): LABPROT, INR in the last 72 hours. ABG No results for input(s): PHART, HCO3 in the last 72 hours.  Invalid input(s): PCO2, PO2  Studies/Results: No results found.  Anti-infectives: Anti-infectives    Start     Dose/Rate Route Frequency Ordered Stop   06/15/16 0800  ceFAZolin (ANCEF) 3 g in dextrose 5 % 50 mL IVPB     3 g 130 mL/hr over 30 Minutes Intravenous On call to O.R. 06/14/16 1022 06/15/16 0840      Assessment/Plan: s/p Procedure(s): OPEN VENTRAL HERNIA REPAIR INSERTION OF ETHICON ULTRAPRO MESH Advance diet Restart home antihypertensive medications.  LOS: 11 days   Marta LamasJames O. Gae BonWyatt, III, MD, FACS [phone]--pager [phone]--office [facility]  [facility] 06/26/2016

## 2016-06-27 ENCOUNTER — Inpatient Hospital Stay (HOSPITAL_COMMUNITY): Payer: Medicare Other

## 2016-06-27 LAB — CBC
HEMATOCRIT: 33.1 % — AB (ref 36.0–46.0)
HEMOGLOBIN: 10.6 g/dL — AB (ref 12.0–15.0)
MCH: 28 pg (ref 26.0–34.0)
MCHC: 32 g/dL (ref 30.0–36.0)
MCV: 87.6 fL (ref 78.0–100.0)
Platelets: 420 10*3/uL — ABNORMAL HIGH (ref 150–400)
RBC: 3.78 MIL/uL — ABNORMAL LOW (ref 3.87–5.11)
RDW: 14.7 % (ref 11.5–15.5)
WBC: 12 10*3/uL — ABNORMAL HIGH (ref 4.0–10.5)

## 2016-06-27 LAB — GLUCOSE, CAPILLARY
GLUCOSE-CAPILLARY: 102 mg/dL — AB (ref 65–99)
GLUCOSE-CAPILLARY: 107 mg/dL — AB (ref 65–99)
GLUCOSE-CAPILLARY: 130 mg/dL — AB (ref 65–99)
Glucose-Capillary: 129 mg/dL — ABNORMAL HIGH (ref 65–99)
Glucose-Capillary: 130 mg/dL — ABNORMAL HIGH (ref 65–99)
Glucose-Capillary: 106 mg/dL — ABNORMAL HIGH (ref 65–99)

## 2016-06-27 MED ORDER — PANTOPRAZOLE SODIUM 40 MG IV SOLR
40.0000 mg | Freq: Two times a day (BID) | INTRAVENOUS | Status: DC
  Administered 2016-06-27 – 2016-06-28 (×3): 40 mg via INTRAVENOUS
  Filled 2016-06-27 (×2): qty 40

## 2016-06-27 NOTE — Evaluation (Signed)
Occupational Therapy Evaluation Patient Details Name: Rhonda MiyamotoLuretta M [name] MRN: [id number] DOB: 29-Jan-1951 Today's Date: 06/27/2016    History of Present Illness The patient is a 66 year old female who presents with an abdominal wall hernia. 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 Pt presents with generalized weakness, abdominal pain and impaired mobility requiring RW use and min assist. She is unable to access her feet for LB bathing and dressing and requires UB propping during standing activities. Pt will need post acute rehab prior to return home. Will follow acutely.    Follow Up Recommendations  SNF;Supervision/Assistance - 24 hour    Equipment Recommendations       Recommendations for Other Services       Precautions / Restrictions Precautions Precautions: Fall Restrictions Weight Bearing Restrictions: No      Mobility Bed Mobility               General bed mobility comments: received up in chair  Transfers Overall transfer level: Needs assistance Equipment used: Rolling walker (2 wheeled) Transfers: Sit to/from Stand Sit to Stand: Min assist         General transfer comment: min assist for stability and power up to standing    Balance Overall balance assessment: Needs assistance Sitting-balance support: Feet supported Sitting balance-Leahy Scale: Fair     Standing balance support: Bilateral upper extremity supported Standing balance-Leahy Scale: Poor Standing balance comment: reliance on UE support during functional tasks at sink, flexed posture do to pain from abdominal incision                            ADL Overall ADL's : Needs  assistance/impaired Eating/Feeding: Independent;Sitting   Grooming: Wash/dry hands;Standing;Minimal assistance Grooming Details (indicate cue type and reason): stabilizes elbows on sink due to pain, assist to manage water and retrieve paper towels Upper Body Bathing: Minimal assistance;Sitting   Lower Body Bathing: Total assistance;Sit to/from stand   Upper Body Dressing : Minimal assistance;Sitting   Lower Body Dressing: Total assistance;Sit to/from stand   Toilet Transfer: Minimal assistance;Ambulation;RW;BSC   Toileting- Clothing Manipulation and Hygiene: Minimal assistance;Sit to/from stand       Functional mobility during ADLs: Minimal assistance;Rolling walker General ADL Comments: Pt with urinary incontinence, assist to wash feet and change socks.     Vision Patient Visual Report: No change from baseline       Perception     Praxis      Pertinent Vitals/Pain Pain Assessment: Faces Faces Pain Scale: Hurts even more Pain Location: abdominal pain Pain Descriptors / Indicators: Sore;Operative site guarding Pain Intervention(s): Monitored during session     Hand Dominance Right   Extremity/Trunk Assessment Upper Extremity Assessment Upper Extremity Assessment: Generalized weakness   Lower Extremity Assessment Lower Extremity Assessment: Defer to PT evaluation       Communication Communication Communication: No difficulties   Cognition Arousal/Alertness: Awake/alert Behavior During Therapy: WFL for tasks assessed/performed Overall Cognitive Status: Within Functional Limits for tasks assessed                     General Comments       Exercises  Shoulder Instructions      Home Living Family/patient expects to be discharged to:: Private residence Living Arrangements: Parent Available Help at Discharge: Family;Available PRN/intermittently Type of Home: House Home Access: Stairs to enter [employer] of Steps: 4 Entrance  Stairs-Rails: Right Home Layout: Two level;Able to live on main level with bedroom/bathroom     Bathroom Shower/Tub: Producer, television/film/video: Standard     Home Equipment: Environmental consultant - 2 wheels          Prior Functioning/Environment Level of Independence: Independent                 OT Problem List: Decreased strength;Decreased activity tolerance;Impaired balance (sitting and/or standing);Decreased knowledge of use of DME or AE;Obesity;Pain      OT Treatment/Interventions: Self-care/ADL training;DME and/or AE instruction;Patient/family education;Balance training;Therapeutic activities    OT Goals(Current goals can be found in the care plan section) Acute Rehab OT Goals Patient Stated Goal: get better OT Goal Formulation: With patient Time For Goal Achievement: 07/11/16 Potential to Achieve Goals: Good ADL Goals Pt Will Perform Grooming: with supervision;standing (3 activities) Pt Will Perform Lower Body Bathing: with supervision;with adaptive equipment;sit to/from stand Pt Will Perform Lower Body Dressing: with supervision;with adaptive equipment;sit to/from stand Pt Will Transfer to Toilet: with supervision;ambulating;bedside commode Pt Will Perform Toileting - Clothing Manipulation and hygiene: with supervision;sit to/from stand Pt/caregiver will Perform Home Exercise Program: Increased strength;Both right and left upper extremity;With theraband;Independently Additional ADL Goal #1: Pt will perform bed mobility with supervision.  OT Frequency: Min 2X/week   Barriers to D/C:            Co-evaluation              End of Session Equipment Utilized During Treatment: Engineer, water Communication:  (pt with concerns about going home without assist)  Activity Tolerance: Patient tolerated treatment well Patient left: in chair;with call bell/phone within reach;with family/visitor present  OT Visit Diagnosis: Unsteadiness on feet (R26.81);Other  abnormalities of gait and mobility (R26.89);History of falling (Z91.81);Muscle weakness (generalized) (M62.81);Pain Pain - part of body:  (abdomen)                ADL either performed or assessed with clinical judgement  Time: 1341-1406 OT Time Calculation (min): 25 min Charges:  OT General Charges $OT Visit: 1 Procedure OT Evaluation $OT Eval Moderate Complexity: 1 Procedure G-Codes:      Evern Bio 06/27/2016, 2:29 PM  [phone]

## 2016-06-27 NOTE — Progress Notes (Signed)
Physical Therapy Treatment Patient Details Name: Rhonda [name] MRN: [id number] DOB: 25-May-1950 Today's Date: 06/27/2016    History of Present Illness The patient is a 66 year old female who presents with an abdominal wall hernia. Now s/p hernia repair; recovery complicated by ileus;  has a past medical history of Anemia; Arthritis; Complication of anesthesia;  Hyperlipemia; Hypertension;  has a past surgical history that includes  Posterior lumbar fusion (2012)    PT Comments    Patient seen for reassessment due to deconditioning during hospital stay. At this time, patient continues to demonstrate need for assist during functional activities and mobility. Additionally, patient with demonstrates some limited activity tolerance. Given current functional status and decreased level of physical assist feel patient would benefit from ST SNF upon acute discharge. Spoke with patient and family who appear in agreement.    VSS throughout session  Follow Up Recommendations  SNF;Supervision/Assistance - 24 hour     Equipment Recommendations  Rolling walker with 5" wheels;3in1 (PT)    Recommendations for Other Services       Precautions / Restrictions Precautions Precautions: Fall Restrictions Weight Bearing Restrictions: No    Mobility  Bed Mobility               General bed mobility comments: received up in chair  Transfers Overall transfer level: Needs assistance Equipment used: Rolling walker (2 wheeled) Transfers: Sit to/from Stand Sit to Stand: Min assist         General transfer comment: min assist for stability and power up to standing  Ambulation/Gait Ambulation/Gait assistance: Min assist Ambulation Distance (Feet): 40 Feet Assistive device: Rolling walker (2 wheeled) Gait Pattern/deviations: Step-through pattern;Decreased step length - right;Decreased step length - left;Decreased stride length Gait velocity: decreased Gait velocity interpretation: Below  normal speed for age/gender General Gait Details: slow steady gait with heavy reliance on RW, VCs for upright posture and positioning.   Stairs            Wheelchair Mobility    Modified Rankin (Stroke Patients Only)       Balance Overall balance assessment: Needs assistance Sitting-balance support: Feet supported Sitting balance-Leahy Scale: Fair     Standing balance support: Bilateral upper extremity supported Standing balance-Leahy Scale: Poor Standing balance comment: reliance on UE support during functional tasks at sink, flexed posture do to pain from abdominal incision                    Cognition Arousal/Alertness: Awake/alert Behavior During Therapy: WFL for tasks assessed/performed Overall Cognitive Status: Within Functional Limits for tasks assessed                      Exercises      General Comments        Pertinent Vitals/Pain Pain Assessment: Faces Faces Pain Scale: Hurts even more Pain Location: abdominal pain Pain Descriptors / Indicators: Sore;Operative site guarding Pain Intervention(s): Monitored during session    Home Living Family/patient expects to be discharged to:: Private residence Living Arrangements: Parent Available Help at Discharge: Family;Available PRN/intermittently Type of Home: House Home Access: Stairs to enter Entrance Stairs-Rails: Right Home Layout: Two level;Able to live on main level with bedroom/bathroom Home Equipment: Walker - 2 wheels      Prior Function Level of Independence: Independent          PT Goals (current goals can now be found in the care plan section) Acute Rehab PT Goals Patient Stated Goal: get better  PT Goal Formulation: With patient Time For Goal Achievement: 07/03/16 Potential to Achieve Goals: Good Progress towards PT goals: Not progressing toward goals - comment (patient with some noted deconditioning)    Frequency    Min 3X/week      PT Plan Discharge plan  needs to be updated    Co-evaluation             End of Session Equipment Utilized During Treatment: Gait belt Activity Tolerance: Patient tolerated treatment well Patient left: in chair;with call bell/phone within reach;with chair alarm set;with family/visitor present Nurse Communication: Mobility status PT Visit Diagnosis: Muscle weakness (generalized) (M62.81)     Time: 1340-1404 PT Time Calculation (min) (ACUTE ONLY): 24 min  Charges:  $Therapeutic Activity: 8-22 mins                    G Codes:       Fabio Asa 07-17-16, 2:11 PM Charlotte Crumb, PT DPT  [phone]

## 2016-06-27 NOTE — Care Management Important Message (Signed)
Important Message  Patient Details  Name: Rhonda MiyamotoLuretta M Tieu MRN: [id number] Date of Birth: Oct 17, 1950   Medicare Important Message Given:  Yes    Kyla BalzarineShealy, Calil Amor Abena 06/27/2016, 1:39 PM

## 2016-06-27 NOTE — Care Management Note (Signed)
Case Management Note  Patient Details  Name: Rhonda MiyamotoLuretta M Propes MRN: [id number] Date of Birth: 1951-02-28  Subjective/Objective:    Pt is post op hernia repair - noted to have PE and ileus, per pt/ot eval today, rec SNF, CSW aware.  NCM will cont to follow for dc needs.                  Action/Plan:   Expected Discharge Date:                  Expected Discharge Plan:  Skilled Nursing Facility  In-House Referral:  Clinical Social Work  Discharge planning Services  CM Consult  Post Acute Care Choice:    Choice offered to:     DME Arranged:    DME Agency:     HH Arranged:    HH Agency:     Status of Service:  Completed, signed off  If discussed at [employer] Meetings, dates discussed:    Additional Comments:  Leone Havenaylor, Jeffifer Rabold Clinton, RN 06/27/2016, 5:02 PM

## 2016-06-27 NOTE — Progress Notes (Signed)
12 Days Post-Op  Subjective: Pt doing well this AM. Tol Soft foods   Objective: Vital signs in last 24 hours: Temp:  [98 F (36.7 C)-99 F (37.2 C)] 98.9 F (37.2 C) (02/26 0739) Pulse Rate:  [85-103] 101 (02/26 0739) Resp:  [14-32] 24 (02/26 0739) BP: (147-163)/(62-90) 149/65 (02/26 0739) SpO2:  [96 %-99 %] 98 % (02/26 0739) Last BM Date: 06/27/16  Intake/Output from previous day: 02/25 0701 - 02/26 0700 In: 3456.3 [P.O.:600; I.V.:2856.3] Out: 200 [Urine:200] Intake/Output this shift: Total I/O In: 651.7 [P.O.:360; I.V.:291.7] Out: -   General appearance: alert and cooperative GI: soft, non-tender; bowel sounds normal; no masses,  no organomegaly  Lab Results:   Recent Labs  06/26/16 0222 06/27/16 0155  WBC 10.8* 12.0*  HGB 9.1* 10.6*  HCT 27.7* 33.1*  PLT 257 420*   BMET  Recent Labs  06/25/16 0419 06/26/16 0222  NA 141 138  K 3.8 3.9  CL 110 106  CO2 24 23  GLUCOSE 130* 112*  BUN 11 5*  CREATININE 0.78 0.70  CALCIUM 8.0* 8.0*   PT/INR No results for input(s): LABPROT, INR in the last 72 hours. ABG No results for input(s): PHART, HCO3 in the last 72 hours.  Invalid input(s): PCO2, PO2  Studies/Results: Dg Abd Portable 1v  Result Date: 06/27/2016 CLINICAL DATA:  Nausea and diarrhea yesterday and today, history of umbilical hernia repair, history of bowel obstruction EXAM: PORTABLE ABDOMEN - 1 VIEW COMPARISON:  06/24/2016 FINDINGS: Persistent mild gaseous distended small bowel loops in mid abdomen probable postop ileus. Midline skin staples are noted lower abdominal and pelvic wall. Again noted postsurgical changes L5-S1 level lumbar spine. NG tube has been removed. IMPRESSION: Persistent mild gaseous distended small bowel loops in mid abdomen probable postop ileus. NG tube has been removed. Electronically Signed   By: Natasha MeadLiviu  Pop M.D.   On: 06/27/2016 09:52    Anti-infectives: Anti-infectives    Start     Dose/Rate Route Frequency Ordered Stop   06/15/16 0800  ceFAZolin (ANCEF) 3 g in dextrose 5 % 50 mL IVPB     3 g 130 mL/hr over 30 Minutes Intravenous On call to O.R. 06/14/16 1022 06/15/16 0840      Assessment/Plan: s/p Procedure(s): OPEN VENTRAL HERNIA REPAIR (N/A) INSERTION OF ETHICON ULTRAPRO MESH (N/A) -Con't soft food -Con't Elliquis -MObilize with PT -Plan for home Tues, likely with HHPT   LOS: 12 days    Marigene Ehlersamirez Jr., Jed Limerickrmando 06/27/2016

## 2016-06-28 DIAGNOSIS — M4140 Neuromuscular scoliosis, site unspecified: Secondary | ICD-10-CM | POA: Diagnosis not present

## 2016-06-28 DIAGNOSIS — Z79899 Other long term (current) drug therapy: Secondary | ICD-10-CM | POA: Diagnosis not present

## 2016-06-28 DIAGNOSIS — R531 Weakness: Secondary | ICD-10-CM | POA: Diagnosis not present

## 2016-06-28 DIAGNOSIS — K56609 Unspecified intestinal obstruction, unspecified as to partial versus complete obstruction: Secondary | ICD-10-CM | POA: Diagnosis not present

## 2016-06-28 DIAGNOSIS — M6281 Muscle weakness (generalized): Secondary | ICD-10-CM | POA: Diagnosis not present

## 2016-06-28 DIAGNOSIS — I10 Essential (primary) hypertension: Secondary | ICD-10-CM | POA: Diagnosis not present

## 2016-06-28 DIAGNOSIS — I2699 Other pulmonary embolism without acute cor pulmonale: Secondary | ICD-10-CM | POA: Diagnosis not present

## 2016-06-28 DIAGNOSIS — R2681 Unsteadiness on feet: Secondary | ICD-10-CM | POA: Diagnosis not present

## 2016-06-28 DIAGNOSIS — E782 Mixed hyperlipidemia: Secondary | ICD-10-CM | POA: Diagnosis not present

## 2016-06-28 DIAGNOSIS — M519 Unspecified thoracic, thoracolumbar and lumbosacral intervertebral disc disorder: Secondary | ICD-10-CM | POA: Diagnosis not present

## 2016-06-28 DIAGNOSIS — Z48815 Encounter for surgical aftercare following surgery on the digestive system: Secondary | ICD-10-CM | POA: Diagnosis not present

## 2016-06-28 DIAGNOSIS — Q762 Congenital spondylolisthesis: Secondary | ICD-10-CM | POA: Diagnosis not present

## 2016-06-28 DIAGNOSIS — K439 Ventral hernia without obstruction or gangrene: Secondary | ICD-10-CM | POA: Diagnosis not present

## 2016-06-28 DIAGNOSIS — K913 Postprocedural intestinal obstruction, unspecified as to partial versus complete: Secondary | ICD-10-CM | POA: Diagnosis not present

## 2016-06-28 DIAGNOSIS — K219 Gastro-esophageal reflux disease without esophagitis: Secondary | ICD-10-CM | POA: Diagnosis not present

## 2016-06-28 DIAGNOSIS — D500 Iron deficiency anemia secondary to blood loss (chronic): Secondary | ICD-10-CM | POA: Diagnosis not present

## 2016-06-28 LAB — CBC
HCT: 28.8 % — ABNORMAL LOW (ref 36.0–46.0)
HEMOGLOBIN: 9.4 g/dL — AB (ref 12.0–15.0)
MCH: 28.2 pg (ref 26.0–34.0)
MCHC: 32.6 g/dL (ref 30.0–36.0)
MCV: 86.5 fL (ref 78.0–100.0)
Platelets: 427 10*3/uL — ABNORMAL HIGH (ref 150–400)
RBC: 3.33 MIL/uL — AB (ref 3.87–5.11)
RDW: 14.7 % (ref 11.5–15.5)
WBC: 10.9 10*3/uL — AB (ref 4.0–10.5)

## 2016-06-28 LAB — GLUCOSE, CAPILLARY
GLUCOSE-CAPILLARY: 112 mg/dL — AB (ref 65–99)
GLUCOSE-CAPILLARY: 99 mg/dL (ref 65–99)
GLUCOSE-CAPILLARY: 76 mg/dL (ref 65–99)
GLUCOSE-CAPILLARY: 104 mg/dL — AB (ref 65–99)

## 2016-06-28 MED ORDER — APIXABAN 5 MG PO TABS: 10.0000 mg | ORAL_TABLET | Freq: Two times a day (BID) | ORAL | Status: DC

## 2016-06-28 MED ORDER — OXYCODONE-ACETAMINOPHEN 5-325 MG PO TABS: 1.0000 | ORAL_TABLET | ORAL | 0 refills | Status: DC | PRN

## 2016-06-28 NOTE — Discharge Summary (Signed)
Physician Discharge Summary  Patient ID: Rhonda [name] MRN: [id number] DOB/AGE: June 06, 1950 66 y.o.  Admit date: 06/15/2016 Discharge date: 06/28/2016  Admission Diagnoses:ventral hernia  Discharge Diagnoses:  Active Problems:   S/P hernia repair   Acute pulmonary embolism [facility](HCC)   Discharged Condition: good  Hospital Course: [facility].  Post op she was slow to regain bowel function and had some assoc n/v.  On POD 4 pt had some hypoxia and tachycardia.  EKG/ trp ruledout cardiac pathology.  CTA revealed a R upper PE.  She was started on heparin gtt and transferred to ICU for further care.  Pulm/CCM was consulted for assistance with management of her PE. She did well  In the ICU and had good pain control. Pt was slowly transitioned to CLD and FLD as ileus resolved.  She was started on eliquis when she was tolerating PO.  She was stable and transferred to the floor.  PT/OT was working with her and rec SNF placement utimately for transition to home care.  She had no further complicaitons, was on a reg diet and tolerating it well.  She was having goof bowel function and deemed stable for DC and DC'd to SNF.  Consults: pulmonary/intensive care and     Significant Diagnostic Studies: angiography: CTA = R upper PE  Treatments: surgery: as above and heparin gtt and eliquis for PE  Discharge Exam: Blood pressure 130/71, pulse 87, temperature 97.8 F (36.6 C), temperature source Oral, resp. rate (!) 23, height 5\' 4"  (1.626 m), weight 123.7 kg (272 lb 11.3 oz), SpO2 96 %. General appearance: alert and cooperative Resp: clear to auscultation bilaterally Cardio: regular rate and rhythm, S1, S2 normal, no murmur, click, rub or gallop GI: soft, non-tender; bowel sounds normal; no masses,  no organomegaly and incision cdi  Disposition: 01-Home or Self Care  Discharge Instructions    Call MD for:  persistant nausea and vomiting    Complete by:  As directed     Call MD for:  redness, tenderness, or signs of infection (pain, swelling, redness, odor or green/yellow discharge around incision site)    Complete by:  As directed    Call MD for:  severe uncontrolled pain    Complete by:  As directed    Call MD for:  temperature >100.4    Complete by:  As directed    Diet - low sodium heart healthy    Complete by:  As directed    Increase activity slowly    Complete by:  As directed      Allergies as of 06/28/2016      Reactions   Aspirin-acetaminophen-caffeine Nausea And Vomiting   Codeine Diarrhea   Stomach cramps      Medication List    TAKE these medications   acetaminophen 650 MG CR tablet Commonly known as:  TYLENOL Take 1,300 mg by mouth every 8 (eight) hours as needed for pain.   albuterol 108 (90 Base) MCG/ACT inhaler Commonly known as:  PROVENTIL HFA;VENTOLIN HFA Inhale 2 puffs into the lungs every 6 (six) hours as needed for wheezing or shortness of breath.   apixaban 5 MG Tabs tablet Commonly known as:  ELIQUIS Take 2 tablets (10 mg total) by mouth 2 (two) times daily.   dorzolamide-timolol 22.3-6.8 MG/ML ophthalmic solution Commonly known as:  COSOPT Place 1 drop into both eyes 2 (two) times daily.   ezetimibe 10 MG tablet Commonly known as:  ZETIA  Take 10 mg by mouth every evening.   fluticasone 50 MCG/ACT nasal spray Commonly known as:  FLONASE Use 2 sprays in each nostril once daily for stuffy nose or drainage.   levocetirizine 5 MG tablet Commonly known as:  XYZAL Take one tablet one to two times daily as directed.   montelukast 10 MG tablet Commonly known as:  SINGULAIR Take 1 tablet (10 mg total) by mouth at bedtime.   multivitamin with minerals Tabs tablet Take 1 tablet by mouth daily.   oxyCODONE-acetaminophen 5-325 MG tablet Commonly known as:  PERCOCET/ROXICET Take 1 tablet by mouth every 4 (four) hours as needed for severe pain.   ranitidine 300 MG capsule Commonly known as:  ZANTAC Take 300  mg by mouth every evening.   valsartan-hydrochlorothiazide 320-25 MG tablet Commonly known as:  DIOVAN-HCT Take 1 tablet by mouth daily.   VITAMIN D3 PO Take 500 Units by mouth daily.       Contact information for follow-up providers    [name] Saver, MD. Schedule an appointment as soon as possible for a visit in 2 week(s).   Specialty:  General Surgery Why:  For wound re-check   Contact information: [address] [place] [address] [phone]            Contact information for after-discharge care    Destination    [facility] Follow up.   Specialty:  Skilled Nursing Facility Contact information: [address] [place] [address] [phone]                  Signed: Marigene Ehlers., Jed Limerick 06/28/2016, 4:10 PM

## 2016-06-28 NOTE — Clinical Social Work Note (Signed)
Clinical Social Work Assessment  Patient Details  Name: Rhonda [name] MRN: [id number] Date of Birth: 08/30/1950  Date of referral:  06/28/16               Reason for consult:  Facility Placement, Discharge Planning                Permission sought to share information with:  Facility Sport and exercise psychologist, Family Supports Permission granted to share information::  Yes, Verbal Permission Granted  Name::     Rhonda [name]  Agency::  SNF's  Relationship::  Daughter  Contact Information:  [phone]  Housing/Transportation Living arrangements for the past 2 months:  Single Family Home Source of Information:  Patient, Medical Team Patient Interpreter Needed:  None Criminal Activity/Legal Involvement Pertinent to Current Situation/Hospitalization:  No - Comment as needed Significant Relationships:  Adult Children, Parents, Spouse Lives with:  Parents Do you feel safe going back to the place where you live?  Yes Need for family participation in patient care:  Yes (Comment)  Care giving concerns:  PT has changed their recommendation from [facility] to SNF.   Social Worker assessment / plan:  CSW met with patient. No supports at bedside. CSW introduced role and explained that PT recommendations would be discussed. Patient is agreeable to SNF. First preference is [facility] CSW has left message for admissions coordinator to notify her. No further concerns. CSW encouraged patient to contact CSW as needed. CSW will continue to follow patient for support and facilitate discharge to SNF once medically stable.  Employment status:  Retired Nurse, adult PT Recommendations:  [place] / Referral to community resources:  [place]  Patient/Family's Response to care:  Patient agreeable to SNF placement. Patient's family supportive and involved in patient's care. Patient appreciated social work intervention.  Patient/Family's  Understanding of and Emotional Response to Diagnosis, Current Treatment, and Prognosis:  Patient appears to have a good understanding of the reason for admission. Patient appears happy with hospital care.  Emotional Assessment Appearance:  Appears stated age Attitude/Demeanor/Rapport:  Other (Pleasant) Affect (typically observed):  Accepting, Appropriate, Calm, Pleasant Orientation:  Oriented to Self, Oriented to Place, Oriented to  Time, Oriented to Situation Alcohol / Substance use:  Never Used Psych involvement (Current and /or in the community):  No (Comment)  Discharge Needs  Concerns to be addressed:  Care Coordination Readmission within the last 30 days:  No Current discharge risk:  Dependent with Mobility Barriers to Discharge:  Continued Medical Work up   Candie Chroman, LCSW 06/28/2016, 12:16 PM

## 2016-06-28 NOTE — Progress Notes (Signed)
13 Days Post-Op  Subjective: Pt con't to do well Tol PO  Ambulating  Objective: Vital signs in last 24 hours: Temp:  [97.4 F (36.3 C)-98.2 F (36.8 C)] 98.1 F (36.7 C) (02/27 1234) Pulse Rate:  [87-96] 87 (02/27 1234) Resp:  [20-27] 23 (02/27 0744) BP: (130-144)/(62-75) 130/71 (02/27 1234) SpO2:  [96 %-99 %] 96 % (02/27 1234) Last BM Date: 06/28/16  Intake/Output from previous day: 02/26 0701 - 02/27 0700 In: 651.7 [P.O.:360; I.V.:291.7] Out: -  Intake/Output this shift: Total I/O In: 720 [P.O.:720] Out: 600 [Urine:600]  General appearance: alert and cooperative GI: soft, non-tender; bowel sounds normal; no masses,  no organomegaly  Lab Results:   Recent Labs  06/27/16 0155 06/28/16 0228  WBC 12.0* 10.9*  HGB 10.6* 9.4*  HCT 33.1* 28.8*  PLT 420* 427*   BMET  Recent Labs  06/26/16 0222  NA 138  K 3.9  CL 106  CO2 23  GLUCOSE 112*  BUN 5*  CREATININE 0.70  CALCIUM 8.0*   PT/INR No results for input(s): LABPROT, INR in the last 72 hours. ABG No results for input(s): PHART, HCO3 in the last 72 hours.  Invalid input(s): PCO2, PO2  Studies/Results: Dg Abd Portable 1v  Result Date: 06/27/2016 CLINICAL DATA:  Nausea and diarrhea yesterday and today, history of umbilical hernia repair, history of bowel obstruction EXAM: PORTABLE ABDOMEN - 1 VIEW COMPARISON:  06/24/2016 FINDINGS: Persistent mild gaseous distended small bowel loops in mid abdomen probable postop ileus. Midline skin staples are noted lower abdominal and pelvic wall. Again noted postsurgical changes L5-S1 level lumbar spine. NG tube has been removed. IMPRESSION: Persistent mild gaseous distended small bowel loops in mid abdomen probable postop ileus. NG tube has been removed. Electronically Signed   By: Natasha MeadLiviu  Pop M.D.   On: 06/27/2016 09:52    Anti-infectives: Anti-infectives    Start     Dose/Rate Route Frequency Ordered Stop   06/15/16 0800  ceFAZolin (ANCEF) 3 g in dextrose 5 % 50 mL  IVPB     3 g 130 mL/hr over 30 Minutes Intravenous On call to O.R. 06/14/16 1022 06/15/16 0840      Assessment/Plan: s/p Procedure(s): OPEN VENTRAL HERNIA REPAIR (N/A) INSERTION OF ETHICON ULTRAPRO MESH (N/A) Plan for SNF with avail DC stapled and place steri strips Mobilize   LOS: 13 days    Marigene Ehlersamirez Jr., Jed Limerickrmando 06/28/2016

## 2016-06-28 NOTE — Progress Notes (Signed)
Occupational Therapy Treatment Patient Details Name: Rhonda [name] MRN: [id number] DOB: November 29, 1950 Today's Date: 06/28/2016    History of present illness The patient is a 66 year old female who presents with an abdominal wall hernia. Now s/p hernia repair; recovery complicated by ileus;  has a past medical history of Anemia; Arthritis; Complication of anesthesia;  Hyperlipemia; Hypertension;  has a past surgical history that includes  Posterior lumbar fusion (2012)   OT comments  Pt performed B UE exercise with level 2 theraband, tolerated well. Pt able to stand for 2 grooming tasks at sink. Continues to require min to mod assist for mobility and toileting. Pt appropriate for continued rehab in SNF.  Follow Up Recommendations  SNF;Supervision/Assistance - 24 hour    Equipment Recommendations       Recommendations for Other Services      Precautions / Restrictions Precautions Precautions: Fall       Mobility Bed Mobility   Bed Mobility: Rolling;Sidelying to Sit;Sit to Sidelying Rolling: Min assist Sidelying to sit: Mod assist     Sit to sidelying: Mod assist General bed mobility comments: encouraged log roll to minimize pain  Transfers Overall transfer level: Needs assistance Equipment used: Rolling walker (2 wheeled) Transfers: Sit to/from Stand Sit to Stand: Min assist         General transfer comment: min assist for stability and power up to standing with momentum    Balance Overall balance assessment: Needs assistance Sitting-balance support: Feet supported Sitting balance-Leahy Scale: Fair     Standing balance support: Bilateral upper extremity supported Standing balance-Leahy Scale: Poor                     ADL Overall ADL's : Needs assistance/impaired     Grooming: Oral care;Standing;Min guard;Wash/dry hands Grooming Details (indicate cue type and reason): stabilized with one hand on sink                 Toilet Transfer: Minimal  assistance;Ambulation;BSC;RW   Toileting- Clothing Manipulation and Hygiene: Minimal assistance;Sit to/from stand       Functional mobility during ADLs: Minimal assistance;Rolling walker        Vision                     Perception     Praxis      Cognition   Behavior During Therapy: WFL for tasks assessed/performed Overall Cognitive Status: Within Functional Limits for tasks assessed                         Exercises General Exercises - Upper Extremity Shoulder Flexion: Strengthening;Both;10 reps;Seated;Theraband Theraband Level (Shoulder Flexion): Level 2 (Red) Shoulder Extension: Strengthening;Both;10 reps;Seated;Theraband Theraband Level (Shoulder Extension): Level 2 (Red) Shoulder Horizontal ABduction: Strengthening;Both;10 reps;Seated;Theraband Theraband Level (Shoulder Horizontal Abduction): Level 2 (Red) Elbow Flexion: Strengthening;Both;10 reps;Seated;Theraband Theraband Level (Elbow Flexion): Level 2 (Red) Elbow Extension: Strengthening;Both;10 reps;Seated;Theraband Theraband Level (Elbow Extension): Level 2 (Red)   Shoulder Instructions       General Comments      Pertinent Vitals/ Pain       Pain Assessment: Faces Faces Pain Scale: Hurts little more Pain Location: abdominal pain Pain Descriptors / Indicators: Sore;Operative site guarding Pain Intervention(s): Monitored during session;Repositioned  Home Living  Prior Functioning/Environment              Frequency  Min 2X/week        Progress Toward Goals  OT Goals(current goals can now be found in the care plan section)  Progress towards OT goals: Progressing toward goals  Acute Rehab OT Goals Patient Stated Goal: get better Time For Goal Achievement: 07/11/16 Potential to Achieve Goals: Good  Plan Discharge plan remains appropriate    Co-evaluation                 End of Session Equipment  Utilized During Treatment: Rolling walker  OT Visit Diagnosis: Unsteadiness on feet (R26.81);Other abnormalities of gait and mobility (R26.89);History of falling (Z91.81);Muscle weakness (generalized) (M62.81);Pain Pain - part of body:  (abdomen)   Activity Tolerance Patient tolerated treatment well   Patient Left in bed;with call bell/phone within reach   Nurse Communication          Time: 1230-1301 OT Time Calculation (min): 31 min  Charges: OT General Charges $OT Visit: 1 Procedure OT Treatments $Self Care/Home Management : 8-22 mins $Therapeutic Exercise: 8-22 mins     Evern Bio 06/28/2016, 1:27 PM  [phone]

## 2016-06-28 NOTE — NC FL2 (Signed)
[place] MEDICAID FL2 LEVEL OF CARE SCREENING TOOL     IDENTIFICATION  Patient Name: Rhonda MiyamotoLuretta M Dobis Birthdate: 30-Mar-1951 Sex: female Admission Date (Current Location): 06/15/2016  [facility] [place]   Facility and Address:  [facility] [place], [address], [place], [place] [address]      Provider Number: [id number]  Attending Physician Name and Address:  Axel FillerArmando Ramirez, MD  Relative Name and Phone Number:       Current Level of Care: Hospital Recommended Level of Care: Skilled Nursing Facility Prior Approval Number:    Date Approved/Denied:   PASRR Number: [id number] [id number]  Discharge Plan: SNF    Current Diagnoses: Patient Active Problem List   Diagnosis Date Noted  . Acute pulmonary embolism (HCC) 06/21/2016  . S/P hernia repair 06/15/2016  . Acute sinusitis 06/12/2015  . Cough 06/12/2015  . History of asthma 06/12/2015    Orientation RESPIRATION BLADDER Height & Weight     Self, Time, Situation, Place  Normal Continent Weight: 272 lb 11.3 oz (123.7 kg) Height:  5\' 4"  (162.6 cm)  BEHAVIORAL SYMPTOMS/MOOD NEUROLOGICAL BOWEL NUTRITION STATUS   (None)  (None) Continent Diet (Soft)  AMBULATORY STATUS COMMUNICATION OF NEEDS Skin   Limited Assist Verbally Surgical wounds, Other (Comment) Psychiatrist(Blister)                       Personal Care Assistance Level of Assistance  Bathing, Feeding, Dressing Bathing Assistance: Maximum assistance Feeding assistance: Independent Dressing Assistance: Maximum assistance     Functional Limitations Info  Sight, Hearing, Speech Sight Info: Adequate Hearing Info: Adequate Speech Info: Adequate    SPECIAL CARE FACTORS FREQUENCY  PT (By licensed PT), OT (By licensed OT)     PT Frequency: 5 x week OT Frequency: 5 x week            Contractures Contractures Info: Not present    Additional Factors Info  Code Status, Allergies Code Status Info: Full Allergies Info:  Aspirin-acetaminophen-caffeine, Codeine           Current Medications (06/28/2016):  This is the current hospital active medication list Current Facility-Administered Medications  Medication Dose Route Frequency Provider Last Rate Last Dose  . acetaminophen (TYLENOL) tablet 650 mg  650 mg Oral Q6H PRN Berna Buehelsea A Connor, MD      . albuterol (PROVENTIL) (2.5 MG/3ML) 0.083% nebulizer solution 2.5 mg  2.5 mg Inhalation Q6H PRN Axel FillerArmando Ramirez, MD   2.5 mg at 06/25/16 1617  . apixaban (ELIQUIS) tablet 10 mg  10 mg Oral BID Darl Householderlison M Masters, RPH   10 mg at 06/28/16 1001   Followed by  . [START ON 07/01/2016] apixaban (ELIQUIS) tablet 5 mg  5 mg Oral BID Darl HouseholderAlison M Masters, [facility]      . bisacodyl (DULCOLAX) suppository 10 mg  10 mg Rectal Daily PRN Axel FillerArmando Ramirez, MD   10 mg at 06/22/16 1438  . chlorhexidine (PERIDEX) 0.12 % solution 15 mL  15 mL Mouth Rinse BID Jimmye NormanJames Wyatt, MD   15 mL at 06/28/16 1002  . dorzolamide-timolol (COSOPT) 22.3-6.8 MG/ML ophthalmic solution 1 drop  1 drop Both Eyes BID Axel FillerArmando Ramirez, MD   1 drop at 06/28/16 1002  . feeding supplement (BOOST / RESOURCE BREEZE) liquid 1 Container  1 Container Oral TID BM Axel FillerArmando Ramirez, MD   1 Container at 06/26/16 1510  . fentaNYL (SUBLIMAZE) injection 25-50 mcg  25-50 mcg Intravenous Q2H PRN Duayne CalPaul W Hoffman, NP  25 mcg at 06/26/16 1857  . hydrALAZINE (APRESOLINE) injection 10-20 mg  10-20 mg Intravenous Q4H PRN Duayne Cal, NP      . irbesartan (AVAPRO) tablet 300 mg  300 mg Oral Daily Axel Filler, MD   300 mg at 06/28/16 1001   And  . hydrochlorothiazide (HYDRODIURIL) tablet 25 mg  25 mg Oral Daily Axel Filler, MD   25 mg at 06/28/16 1001  . HYDROmorphone (DILAUDID) injection 1 mg  1 mg Intravenous Q4H PRN Axel Filler, MD   1 mg at 06/20/16 1319  . insulin aspart (novoLOG) injection 0-15 Units  0-15 Units Subcutaneous Q4H Berna Bue, MD   2 Units at 06/27/16 2046  . loratadine (CLARITIN) tablet 10 mg  10 mg Oral q1800  Axel Filler, MD   10 mg at 06/27/16 1828  . MEDLINE mouth rinse  15 mL Mouth Rinse q12n4p Jimmye Norman, MD   15 mL at 06/27/16 1600  . metoCLOPramide (REGLAN) injection 10 mg  10 mg Intravenous Q6H Axel Filler, MD   10 mg at 06/28/16 1001  . multivitamin with minerals tablet 1 tablet  1 tablet Oral Daily Axel Filler, MD   1 tablet at 06/26/16 1027  . ondansetron (ZOFRAN-ODT) disintegrating tablet 4 mg  4 mg Oral Q4H PRN Berna Bue, MD       Or  . ondansetron [facility]) injection 4 mg  4 mg Intravenous Q4H PRN Berna Bue, MD   4 mg at 06/26/16 2340  . pantoprazole (PROTONIX) injection 40 mg  40 mg Intravenous Q12H Axel Filler, MD   40 mg at 06/28/16 1002  . promethazine (PHENERGAN) injection 12.5 mg  12.5 mg Intravenous Q6H PRN Violeta Gelinas, MD   12.5 mg at 06/27/16 1610  . senna (SENOKOT) tablet 8.6 mg  1 tablet Oral BID Berna Bue, MD   8.6 mg at 06/27/16 2216  . zolpidem (AMBIEN) tablet 5 mg  5 mg Oral QHS PRN Axel Filler, MD   5 mg at 06/20/16 0309     Discharge Medications: Please see discharge summary for a list of discharge medications.  Relevant Imaging Results:  Relevant Lab Results:   Additional Information SS#: [id number]  Margarito Liner, LCSW

## 2016-06-28 NOTE — Clinical Social Work Placement (Signed)
CLINICAL SOCIAL WORK PLACEMENT  NOTE  Date:  06/28/2016  Patient Details  Name: Rhonda [name] MRN: [id number] Date of Birth: 12/28/1950  Clinical Social Work is seeking post-discharge placement for this patient at the Skilled  Nursing Facility level of care (*CSW will initial, date and re-position this form in  chart as items are completed):  Yes   Patient/family provided with [place] Clinical Social Work Department's list of facilities offering this level of care within the geographic area requested by the patient (or if unable, by the patient's family).  Yes   Patient/family informed of their freedom to choose among providers that offer the needed level of care, that participate in Medicare, Medicaid or managed care program needed by the patient, have an available bed and are willing to accept the patient.  Yes   Patient/family informed of 's ownership interest in [facility] and [facility], as well as of the fact that they are under no obligation to receive care at these facilities.  PASRR submitted to EDS on 06/28/16     PASRR number received on 06/28/16     Existing PASRR number confirmed on       FL2 transmitted to all facilities in geographic area requested by pt/family on 06/28/16     FL2 transmitted to all facilities within larger geographic area on       Patient informed that his/her managed care company has contracts with or will negotiate with certain facilities, including the following:        Yes   Patient/family informed of bed offers received.  Patient chooses bed at [facility]     Physician recommends and patient chooses bed at      Patient to be transferred to [facility] on 06/28/16.  Patient to be transferred to facility by [facility]     Patient family notified on 06/28/16 of transfer.  Name of family member notified:  [name]     PHYSICIAN Please prepare prescriptions     Additional Comment:     _______________________________________________ Margarito Liner, LCSW 06/28/2016, 4:39 PM

## 2016-06-28 NOTE — Progress Notes (Signed)
Discharge note. Patient educated on wound care, signs and symptoms to look for, follow up appointments, when to call the MD, medication and when to take them, and information on new medications.  RN called report to receiving nurse at [facility] and ptient discharged  with [facility].   Curtis SitesHayley H Barbara Keng, RN

## 2016-06-28 NOTE — Progress Notes (Signed)
Patient's staples removed from midline abdominal incision per MD order. Steri strips applied. Incision has minimal drainage and RN placed a 2x2 gauze.

## 2016-06-28 NOTE — Clinical Social Work Note (Addendum)
Patient has a bed at [facility] today. CSW received voicemail from MD about discharging today. CSW called back and left voicemail. CSW also paged PA. Patient and her daughter [name] are aware of plan.  Charlynn CourtSarah Jayma Volpi, CSW [phone]  3:56 pm Received call back from MD. He will start working on discharge order and discharge summary.  Charlynn CourtSarah Jalen Oberry, CSW [phone]

## 2016-06-28 NOTE — Clinical Social Work Note (Signed)
CSW facilitated patient discharge including contacting patient family and facility to confirm patient discharge plans. Clinical information faxed to facility and family agreeable with plan. CSW arranged ambulance transport via [facility] to [facility] at 5:30 pm. RN to call report prior to discharge [phone] Room [address]).  CSW will sign off for now as social work intervention is no longer needed. Please consult us again if new needs arise.  Charlynn CourtSarah Egan Sahlin, CSW [phone]

## 2016-06-28 NOTE — Clinical Social Work Placement (Signed)
CLINICAL SOCIAL WORK PLACEMENT  NOTE  Date:  06/28/2016  Patient Details  Name: Rhonda [name] MRN: [id number] Date of Birth: Sep 28, 1950  Clinical Social Work is seeking post-discharge placement for this patient at the Skilled  Nursing Facility level of care (*CSW will initial, date and re-position this form in  chart as items are completed):  Yes   Patient/family provided with [place] Clinical Social Work Department's list of facilities offering this level of care within the geographic area requested by the patient (or if unable, by the patient's family).  Yes   Patient/family informed of their freedom to choose among providers that offer the needed level of care, that participate in Medicare, Medicaid or managed care program needed by the patient, have an available bed and are willing to accept the patient.  Yes   Patient/family informed of [facility]'s ownership interest in [facility] and [facility], as well as of the fact that they are under no obligation to receive care at these facilities.  PASRR submitted to EDS on 06/28/16     PASRR number received on 06/28/16     Existing PASRR number confirmed on       FL2 transmitted to all facilities in geographic area requested by pt/family on 06/28/16     FL2 transmitted to all facilities within larger geographic area on       Patient informed that his/her managed care company has contracts with or will negotiate with certain facilities, including the following:            Patient/family informed of bed offers received.  Patient chooses bed at       Physician recommends and patient chooses bed at      Patient to be transferred to   on  .  Patient to be transferred to facility by       Patient family notified on   of transfer.  Name of family member notified:        PHYSICIAN Please sign FL2     Additional Comment:    _______________________________________________ Margarito Liner, LCSW 06/28/2016,  12:19 PM

## 2016-07-01 ENCOUNTER — Encounter: Payer: Self-pay | Admitting: Internal Medicine

## 2016-07-01 ENCOUNTER — Non-Acute Institutional Stay (SKILLED_NURSING_FACILITY): Payer: Medicare Other | Admitting: Internal Medicine

## 2016-07-01 DIAGNOSIS — R531 Weakness: Secondary | ICD-10-CM

## 2016-07-01 DIAGNOSIS — D72829 Elevated white blood cell count, unspecified: Secondary | ICD-10-CM | POA: Diagnosis not present

## 2016-07-01 DIAGNOSIS — I10 Essential (primary) hypertension: Secondary | ICD-10-CM

## 2016-07-01 DIAGNOSIS — D500 Iron deficiency anemia secondary to blood loss (chronic): Secondary | ICD-10-CM

## 2016-07-01 DIAGNOSIS — K439 Ventral hernia without obstruction or gangrene: Secondary | ICD-10-CM | POA: Diagnosis not present

## 2016-07-01 DIAGNOSIS — K219 Gastro-esophageal reflux disease without esophagitis: Secondary | ICD-10-CM | POA: Diagnosis not present

## 2016-07-01 DIAGNOSIS — I2699 Other pulmonary embolism without acute cor pulmonale: Secondary | ICD-10-CM | POA: Diagnosis not present
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
## 2016-07-04 DIAGNOSIS — Z79899 Other long term (current) drug therapy: Secondary | ICD-10-CM | POA: Diagnosis not present

## 2016-07-06 ENCOUNTER — Non-Acute Institutional Stay (SKILLED_NURSING_FACILITY): Payer: Medicare Other | Admitting: Family

## 2016-07-06 DIAGNOSIS — E782 Mixed hyperlipidemia: Secondary | ICD-10-CM | POA: Diagnosis not present

## 2016-07-06 DIAGNOSIS — R2681 Unsteadiness on feet: Secondary | ICD-10-CM | POA: Diagnosis not present

## 2016-07-06 DIAGNOSIS — Z8709 Personal history of other diseases of the respiratory system: Secondary | ICD-10-CM | POA: Diagnosis not present

## 2016-07-06 DIAGNOSIS — I2699 Other pulmonary embolism without acute cor pulmonale: Secondary | ICD-10-CM

## 2016-07-06 DIAGNOSIS — Z9889 Other specified postprocedural states: Secondary | ICD-10-CM

## 2016-07-06 DIAGNOSIS — Z8719 Personal history of other diseases of the digestive system: Secondary | ICD-10-CM

## 2016-07-06 DIAGNOSIS — E876 Hypokalemia: Secondary | ICD-10-CM

## 2016-07-06 DIAGNOSIS — M6281 Muscle weakness (generalized): Secondary | ICD-10-CM

## 2016-07-06 DIAGNOSIS — Z79899 Other long term (current) drug therapy: Secondary | ICD-10-CM | POA: Diagnosis not present

## 2016-07-06 DIAGNOSIS — I10 Essential (primary) hypertension: Secondary | ICD-10-CM | POA: Diagnosis not present
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
## 2016-07-12 DIAGNOSIS — Z7901 Long term (current) use of anticoagulants: Secondary | ICD-10-CM | POA: Diagnosis not present

## 2016-07-12 DIAGNOSIS — E782 Mixed hyperlipidemia: Secondary | ICD-10-CM | POA: Diagnosis not present

## 2016-07-12 DIAGNOSIS — J45909 Unspecified asthma, uncomplicated: Secondary | ICD-10-CM | POA: Diagnosis not present

## 2016-07-12 DIAGNOSIS — M1991 Primary osteoarthritis, unspecified site: Secondary | ICD-10-CM | POA: Diagnosis not present

## 2016-07-12 DIAGNOSIS — Z48815 Encounter for surgical aftercare following surgery on the digestive system: Secondary | ICD-10-CM | POA: Diagnosis not present

## 2016-07-12 DIAGNOSIS — Z79891 Long term (current) use of opiate analgesic: Secondary | ICD-10-CM | POA: Diagnosis not present

## 2016-07-12 DIAGNOSIS — Z792 Long term (current) use of antibiotics: Secondary | ICD-10-CM | POA: Diagnosis not present

## 2016-07-12 DIAGNOSIS — I2699 Other pulmonary embolism without acute cor pulmonale: Secondary | ICD-10-CM | POA: Diagnosis not present

## 2016-07-12 DIAGNOSIS — I10 Essential (primary) hypertension: Secondary | ICD-10-CM | POA: Diagnosis not present

## 2016-07-12 DIAGNOSIS — E669 Obesity, unspecified: Secondary | ICD-10-CM | POA: Diagnosis not present

## 2016-07-12 DIAGNOSIS — Z6841 Body Mass Index (BMI) 40.0 and over, adult: Secondary | ICD-10-CM | POA: Diagnosis not present

## 2016-07-15 DIAGNOSIS — M1991 Primary osteoarthritis, unspecified site: Secondary | ICD-10-CM | POA: Diagnosis not present

## 2016-07-15 DIAGNOSIS — J45909 Unspecified asthma, uncomplicated: Secondary | ICD-10-CM | POA: Diagnosis not present

## 2016-07-15 DIAGNOSIS — E782 Mixed hyperlipidemia: Secondary | ICD-10-CM | POA: Diagnosis not present

## 2016-07-15 DIAGNOSIS — Z792 Long term (current) use of antibiotics: Secondary | ICD-10-CM | POA: Diagnosis not present

## 2016-07-15 DIAGNOSIS — Z48815 Encounter for surgical aftercare following surgery on the digestive system: Secondary | ICD-10-CM | POA: Diagnosis not present

## 2016-07-15 DIAGNOSIS — Z7901 Long term (current) use of anticoagulants: Secondary | ICD-10-CM | POA: Diagnosis not present

## 2016-07-15 DIAGNOSIS — I10 Essential (primary) hypertension: Secondary | ICD-10-CM | POA: Diagnosis not present

## 2016-07-15 DIAGNOSIS — Z79891 Long term (current) use of opiate analgesic: Secondary | ICD-10-CM | POA: Diagnosis not present

## 2016-07-15 DIAGNOSIS — I2699 Other pulmonary embolism without acute cor pulmonale: Secondary | ICD-10-CM | POA: Diagnosis not present

## 2016-07-18 DIAGNOSIS — E782 Mixed hyperlipidemia: Secondary | ICD-10-CM | POA: Diagnosis not present

## 2016-07-18 DIAGNOSIS — J45909 Unspecified asthma, uncomplicated: Secondary | ICD-10-CM | POA: Diagnosis not present

## 2016-07-18 DIAGNOSIS — M1991 Primary osteoarthritis, unspecified site: Secondary | ICD-10-CM | POA: Diagnosis not present

## 2016-07-18 DIAGNOSIS — I10 Essential (primary) hypertension: Secondary | ICD-10-CM | POA: Diagnosis not present

## 2016-07-18 DIAGNOSIS — Z48815 Encounter for surgical aftercare following surgery on the digestive system: Secondary | ICD-10-CM | POA: Diagnosis not present

## 2016-07-18 DIAGNOSIS — Z792 Long term (current) use of antibiotics: Secondary | ICD-10-CM | POA: Diagnosis not present

## 2016-07-18 DIAGNOSIS — Z7901 Long term (current) use of anticoagulants: Secondary | ICD-10-CM | POA: Diagnosis not present

## 2016-07-18 DIAGNOSIS — I2699 Other pulmonary embolism without acute cor pulmonale: Secondary | ICD-10-CM | POA: Diagnosis not present

## 2016-07-18 DIAGNOSIS — Z79891 Long term (current) use of opiate analgesic: Secondary | ICD-10-CM | POA: Diagnosis not present

## 2016-07-19 ENCOUNTER — Telehealth: Payer: Self-pay | Admitting: Allergy and Immunology

## 2016-07-19 NOTE — Telephone Encounter (Signed)
Pt has a question regarding bill she received. Please call back.

## 2016-07-19 NOTE — Telephone Encounter (Signed)
Called Patient back, no answer, left a message on her voice mail to not worry about this bill she received for $175.00 right now.  I needed to look into it more.  But it look like the insurance was not filed properly.  I would look at it again in greater detail tomorrow. And give her a call back by Friday.  I just didn't want her worrying about it right now.

## 2016-07-20 DIAGNOSIS — Z79891 Long term (current) use of opiate analgesic: Secondary | ICD-10-CM | POA: Diagnosis not present

## 2016-07-20 DIAGNOSIS — I10 Essential (primary) hypertension: Secondary | ICD-10-CM | POA: Diagnosis not present

## 2016-07-20 DIAGNOSIS — Z792 Long term (current) use of antibiotics: Secondary | ICD-10-CM | POA: Diagnosis not present

## 2016-07-20 DIAGNOSIS — M1991 Primary osteoarthritis, unspecified site: Secondary | ICD-10-CM | POA: Diagnosis not present

## 2016-07-20 DIAGNOSIS — E782 Mixed hyperlipidemia: Secondary | ICD-10-CM | POA: Diagnosis not present

## 2016-07-20 DIAGNOSIS — Z7901 Long term (current) use of anticoagulants: Secondary | ICD-10-CM | POA: Diagnosis not present

## 2016-07-20 DIAGNOSIS — Z48815 Encounter for surgical aftercare following surgery on the digestive system: Secondary | ICD-10-CM | POA: Diagnosis not present

## 2016-07-20 DIAGNOSIS — J45909 Unspecified asthma, uncomplicated: Secondary | ICD-10-CM | POA: Diagnosis not present

## 2016-07-20 DIAGNOSIS — I2699 Other pulmonary embolism without acute cor pulmonale: Secondary | ICD-10-CM | POA: Diagnosis not present

## 2016-07-21 DIAGNOSIS — Z09 Encounter for follow-up examination after completed treatment for conditions other than malignant neoplasm: Secondary | ICD-10-CM | POA: Diagnosis not present

## 2016-07-21 DIAGNOSIS — I2699 Other pulmonary embolism without acute cor pulmonale: Secondary | ICD-10-CM | POA: Diagnosis not present

## 2016-07-21 DIAGNOSIS — K567 Ileus, unspecified: Secondary | ICD-10-CM | POA: Diagnosis not present

## 2016-07-21 NOTE — Telephone Encounter (Signed)
Routing to Danielle.

## 2016-07-22 DIAGNOSIS — Z79891 Long term (current) use of opiate analgesic: Secondary | ICD-10-CM | POA: Diagnosis not present

## 2016-07-22 DIAGNOSIS — M1991 Primary osteoarthritis, unspecified site: Secondary | ICD-10-CM | POA: Diagnosis not present

## 2016-07-22 DIAGNOSIS — Z7901 Long term (current) use of anticoagulants: Secondary | ICD-10-CM | POA: Diagnosis not present

## 2016-07-22 DIAGNOSIS — Z792 Long term (current) use of antibiotics: Secondary | ICD-10-CM | POA: Diagnosis not present

## 2016-07-22 DIAGNOSIS — E782 Mixed hyperlipidemia: Secondary | ICD-10-CM | POA: Diagnosis not present

## 2016-07-22 DIAGNOSIS — I2699 Other pulmonary embolism without acute cor pulmonale: Secondary | ICD-10-CM | POA: Diagnosis not present

## 2016-07-22 DIAGNOSIS — Z48815 Encounter for surgical aftercare following surgery on the digestive system: Secondary | ICD-10-CM | POA: Diagnosis not present

## 2016-07-22 DIAGNOSIS — J45909 Unspecified asthma, uncomplicated: Secondary | ICD-10-CM | POA: Diagnosis not present

## 2016-07-22 DIAGNOSIS — I10 Essential (primary) hypertension: Secondary | ICD-10-CM | POA: Diagnosis not present

## 2016-07-25 DIAGNOSIS — Z7901 Long term (current) use of anticoagulants: Secondary | ICD-10-CM | POA: Diagnosis not present

## 2016-07-25 DIAGNOSIS — Z79891 Long term (current) use of opiate analgesic: Secondary | ICD-10-CM | POA: Diagnosis not present

## 2016-07-25 DIAGNOSIS — Z48815 Encounter for surgical aftercare following surgery on the digestive system: Secondary | ICD-10-CM | POA: Diagnosis not present

## 2016-07-25 DIAGNOSIS — Z792 Long term (current) use of antibiotics: Secondary | ICD-10-CM | POA: Diagnosis not present

## 2016-07-25 DIAGNOSIS — M1991 Primary osteoarthritis, unspecified site: Secondary | ICD-10-CM | POA: Diagnosis not present

## 2016-07-25 DIAGNOSIS — E782 Mixed hyperlipidemia: Secondary | ICD-10-CM | POA: Diagnosis not present

## 2016-07-25 DIAGNOSIS — I10 Essential (primary) hypertension: Secondary | ICD-10-CM | POA: Diagnosis not present

## 2016-07-25 DIAGNOSIS — I2699 Other pulmonary embolism without acute cor pulmonale: Secondary | ICD-10-CM | POA: Diagnosis not present

## 2016-07-25 DIAGNOSIS — J45909 Unspecified asthma, uncomplicated: Secondary | ICD-10-CM | POA: Diagnosis not present

## 2016-07-26 NOTE — Telephone Encounter (Signed)
Re-filed Dos that was in question, which had been filed incorrectly the first time.

## 2016-07-27 DIAGNOSIS — I10 Essential (primary) hypertension: Secondary | ICD-10-CM | POA: Diagnosis not present

## 2016-07-27 DIAGNOSIS — Z7901 Long term (current) use of anticoagulants: Secondary | ICD-10-CM | POA: Diagnosis not present

## 2016-07-27 DIAGNOSIS — Z48815 Encounter for surgical aftercare following surgery on the digestive system: Secondary | ICD-10-CM | POA: Diagnosis not present

## 2016-07-27 DIAGNOSIS — E782 Mixed hyperlipidemia: Secondary | ICD-10-CM | POA: Diagnosis not present

## 2016-07-27 DIAGNOSIS — Z79891 Long term (current) use of opiate analgesic: Secondary | ICD-10-CM | POA: Diagnosis not present

## 2016-07-27 DIAGNOSIS — J45909 Unspecified asthma, uncomplicated: Secondary | ICD-10-CM | POA: Diagnosis not present

## 2016-07-27 DIAGNOSIS — M1991 Primary osteoarthritis, unspecified site: Secondary | ICD-10-CM | POA: Diagnosis not present

## 2016-07-27 DIAGNOSIS — Z792 Long term (current) use of antibiotics: Secondary | ICD-10-CM | POA: Diagnosis not present

## 2016-07-27 DIAGNOSIS — I2699 Other pulmonary embolism without acute cor pulmonale: Secondary | ICD-10-CM | POA: Diagnosis not present

## 2016-07-29 DIAGNOSIS — M1991 Primary osteoarthritis, unspecified site: Secondary | ICD-10-CM | POA: Diagnosis not present

## 2016-07-29 DIAGNOSIS — Z792 Long term (current) use of antibiotics: Secondary | ICD-10-CM | POA: Diagnosis not present

## 2016-07-29 DIAGNOSIS — Z7901 Long term (current) use of anticoagulants: Secondary | ICD-10-CM | POA: Diagnosis not present

## 2016-07-29 DIAGNOSIS — Z48815 Encounter for surgical aftercare following surgery on the digestive system: Secondary | ICD-10-CM | POA: Diagnosis not present

## 2016-07-29 DIAGNOSIS — Z79891 Long term (current) use of opiate analgesic: Secondary | ICD-10-CM | POA: Diagnosis not present

## 2016-07-29 DIAGNOSIS — E782 Mixed hyperlipidemia: Secondary | ICD-10-CM | POA: Diagnosis not present

## 2016-07-29 DIAGNOSIS — I2699 Other pulmonary embolism without acute cor pulmonale: Secondary | ICD-10-CM | POA: Diagnosis not present

## 2016-07-29 DIAGNOSIS — I10 Essential (primary) hypertension: Secondary | ICD-10-CM | POA: Diagnosis not present

## 2016-07-29 DIAGNOSIS — J45909 Unspecified asthma, uncomplicated: Secondary | ICD-10-CM | POA: Diagnosis not present

## 2016-08-02 DIAGNOSIS — Z79891 Long term (current) use of opiate analgesic: Secondary | ICD-10-CM | POA: Diagnosis not present

## 2016-08-02 DIAGNOSIS — Z792 Long term (current) use of antibiotics: Secondary | ICD-10-CM | POA: Diagnosis not present

## 2016-08-02 DIAGNOSIS — I2699 Other pulmonary embolism without acute cor pulmonale: Secondary | ICD-10-CM | POA: Diagnosis not present

## 2016-08-02 DIAGNOSIS — E782 Mixed hyperlipidemia: Secondary | ICD-10-CM | POA: Diagnosis not present

## 2016-08-02 DIAGNOSIS — Z48815 Encounter for surgical aftercare following surgery on the digestive system: Secondary | ICD-10-CM | POA: Diagnosis not present

## 2016-08-02 DIAGNOSIS — J45909 Unspecified asthma, uncomplicated: Secondary | ICD-10-CM | POA: Diagnosis not present

## 2016-08-02 DIAGNOSIS — I10 Essential (primary) hypertension: Secondary | ICD-10-CM | POA: Diagnosis not present

## 2016-08-02 DIAGNOSIS — Z7901 Long term (current) use of anticoagulants: Secondary | ICD-10-CM | POA: Diagnosis not present

## 2016-08-02 DIAGNOSIS — M1991 Primary osteoarthritis, unspecified site: Secondary | ICD-10-CM | POA: Diagnosis not present

## 2016-08-04 DIAGNOSIS — I2699 Other pulmonary embolism without acute cor pulmonale: Secondary | ICD-10-CM | POA: Diagnosis not present

## 2016-08-04 DIAGNOSIS — Z792 Long term (current) use of antibiotics: Secondary | ICD-10-CM | POA: Diagnosis not present

## 2016-08-04 DIAGNOSIS — Z7901 Long term (current) use of anticoagulants: Secondary | ICD-10-CM | POA: Diagnosis not present

## 2016-08-04 DIAGNOSIS — M1991 Primary osteoarthritis, unspecified site: Secondary | ICD-10-CM | POA: Diagnosis not present

## 2016-08-04 DIAGNOSIS — Z79891 Long term (current) use of opiate analgesic: Secondary | ICD-10-CM | POA: Diagnosis not present

## 2016-08-04 DIAGNOSIS — I10 Essential (primary) hypertension: Secondary | ICD-10-CM | POA: Diagnosis not present

## 2016-08-04 DIAGNOSIS — J45909 Unspecified asthma, uncomplicated: Secondary | ICD-10-CM | POA: Diagnosis not present

## 2016-08-04 DIAGNOSIS — E782 Mixed hyperlipidemia: Secondary | ICD-10-CM | POA: Diagnosis not present

## 2016-08-04 DIAGNOSIS — Z48815 Encounter for surgical aftercare following surgery on the digestive system: Secondary | ICD-10-CM | POA: Diagnosis not present

## 2016-08-08 DIAGNOSIS — Z79891 Long term (current) use of opiate analgesic: Secondary | ICD-10-CM | POA: Diagnosis not present

## 2016-08-08 DIAGNOSIS — Z48815 Encounter for surgical aftercare following surgery on the digestive system: Secondary | ICD-10-CM | POA: Diagnosis not present

## 2016-08-08 DIAGNOSIS — M1991 Primary osteoarthritis, unspecified site: Secondary | ICD-10-CM | POA: Diagnosis not present

## 2016-08-08 DIAGNOSIS — I10 Essential (primary) hypertension: Secondary | ICD-10-CM | POA: Diagnosis not present

## 2016-08-08 DIAGNOSIS — J45909 Unspecified asthma, uncomplicated: Secondary | ICD-10-CM | POA: Diagnosis not present

## 2016-08-08 DIAGNOSIS — Z792 Long term (current) use of antibiotics: Secondary | ICD-10-CM | POA: Diagnosis not present

## 2016-08-08 DIAGNOSIS — I2699 Other pulmonary embolism without acute cor pulmonale: Secondary | ICD-10-CM | POA: Diagnosis not present

## 2016-08-08 DIAGNOSIS — E782 Mixed hyperlipidemia: Secondary | ICD-10-CM | POA: Diagnosis not present

## 2016-08-08 DIAGNOSIS — Z7901 Long term (current) use of anticoagulants: Secondary | ICD-10-CM | POA: Diagnosis not present

## 2016-08-09 DIAGNOSIS — Z792 Long term (current) use of antibiotics: Secondary | ICD-10-CM | POA: Diagnosis not present

## 2016-08-09 DIAGNOSIS — Z7901 Long term (current) use of anticoagulants: Secondary | ICD-10-CM | POA: Diagnosis not present

## 2016-08-09 DIAGNOSIS — J45909 Unspecified asthma, uncomplicated: Secondary | ICD-10-CM | POA: Diagnosis not present

## 2016-08-09 DIAGNOSIS — M1991 Primary osteoarthritis, unspecified site: Secondary | ICD-10-CM | POA: Diagnosis not present

## 2016-08-09 DIAGNOSIS — E782 Mixed hyperlipidemia: Secondary | ICD-10-CM | POA: Diagnosis not present

## 2016-08-09 DIAGNOSIS — Z48815 Encounter for surgical aftercare following surgery on the digestive system: Secondary | ICD-10-CM | POA: Diagnosis not present

## 2016-08-09 DIAGNOSIS — Z79891 Long term (current) use of opiate analgesic: Secondary | ICD-10-CM | POA: Diagnosis not present

## 2016-08-09 DIAGNOSIS — I10 Essential (primary) hypertension: Secondary | ICD-10-CM | POA: Diagnosis not present

## 2016-08-09 DIAGNOSIS — I2699 Other pulmonary embolism without acute cor pulmonale: Secondary | ICD-10-CM | POA: Diagnosis not present

## 2016-08-10 DIAGNOSIS — I10 Essential (primary) hypertension: Secondary | ICD-10-CM | POA: Diagnosis not present

## 2016-08-10 DIAGNOSIS — Z48815 Encounter for surgical aftercare following surgery on the digestive system: Secondary | ICD-10-CM | POA: Diagnosis not present

## 2016-08-10 DIAGNOSIS — E782 Mixed hyperlipidemia: Secondary | ICD-10-CM | POA: Diagnosis not present

## 2016-08-10 DIAGNOSIS — M1991 Primary osteoarthritis, unspecified site: Secondary | ICD-10-CM | POA: Diagnosis not present

## 2016-08-10 DIAGNOSIS — J45909 Unspecified asthma, uncomplicated: Secondary | ICD-10-CM | POA: Diagnosis not present

## 2016-08-10 DIAGNOSIS — I2699 Other pulmonary embolism without acute cor pulmonale: Secondary | ICD-10-CM | POA: Diagnosis not present

## 2016-08-10 DIAGNOSIS — Z7901 Long term (current) use of anticoagulants: Secondary | ICD-10-CM | POA: Diagnosis not present

## 2016-08-10 DIAGNOSIS — Z792 Long term (current) use of antibiotics: Secondary | ICD-10-CM | POA: Diagnosis not present

## 2016-08-10 DIAGNOSIS — Z79891 Long term (current) use of opiate analgesic: Secondary | ICD-10-CM | POA: Diagnosis not present

## 2016-08-12 DIAGNOSIS — Z792 Long term (current) use of antibiotics: Secondary | ICD-10-CM | POA: Diagnosis not present

## 2016-08-12 DIAGNOSIS — Z7901 Long term (current) use of anticoagulants: Secondary | ICD-10-CM | POA: Diagnosis not present

## 2016-08-12 DIAGNOSIS — J45909 Unspecified asthma, uncomplicated: Secondary | ICD-10-CM | POA: Diagnosis not present

## 2016-08-12 DIAGNOSIS — Z48815 Encounter for surgical aftercare following surgery on the digestive system: Secondary | ICD-10-CM | POA: Diagnosis not present

## 2016-08-12 DIAGNOSIS — E782 Mixed hyperlipidemia: Secondary | ICD-10-CM | POA: Diagnosis not present

## 2016-08-12 DIAGNOSIS — Z79891 Long term (current) use of opiate analgesic: Secondary | ICD-10-CM | POA: Diagnosis not present

## 2016-08-12 DIAGNOSIS — M1991 Primary osteoarthritis, unspecified site: Secondary | ICD-10-CM | POA: Diagnosis not present

## 2016-08-12 DIAGNOSIS — I10 Essential (primary) hypertension: Secondary | ICD-10-CM | POA: Diagnosis not present

## 2016-08-12 DIAGNOSIS — I2699 Other pulmonary embolism without acute cor pulmonale: Secondary | ICD-10-CM | POA: Diagnosis not present

## 2016-08-15 DIAGNOSIS — I2699 Other pulmonary embolism without acute cor pulmonale: Secondary | ICD-10-CM | POA: Diagnosis not present

## 2016-08-15 DIAGNOSIS — Z7901 Long term (current) use of anticoagulants: Secondary | ICD-10-CM | POA: Diagnosis not present

## 2016-08-15 DIAGNOSIS — I10 Essential (primary) hypertension: Secondary | ICD-10-CM | POA: Diagnosis not present

## 2016-08-15 DIAGNOSIS — J45909 Unspecified asthma, uncomplicated: Secondary | ICD-10-CM | POA: Diagnosis not present

## 2016-08-15 DIAGNOSIS — E782 Mixed hyperlipidemia: Secondary | ICD-10-CM | POA: Diagnosis not present

## 2016-08-15 DIAGNOSIS — Z79891 Long term (current) use of opiate analgesic: Secondary | ICD-10-CM | POA: Diagnosis not present

## 2016-08-15 DIAGNOSIS — M1991 Primary osteoarthritis, unspecified site: Secondary | ICD-10-CM | POA: Diagnosis not present

## 2016-08-15 DIAGNOSIS — Z48815 Encounter for surgical aftercare following surgery on the digestive system: Secondary | ICD-10-CM | POA: Diagnosis not present

## 2016-08-15 DIAGNOSIS — Z792 Long term (current) use of antibiotics: Secondary | ICD-10-CM | POA: Diagnosis not present

## 2016-08-18 DIAGNOSIS — E782 Mixed hyperlipidemia: Secondary | ICD-10-CM | POA: Diagnosis not present

## 2016-08-18 DIAGNOSIS — Z7901 Long term (current) use of anticoagulants: Secondary | ICD-10-CM | POA: Diagnosis not present

## 2016-08-18 DIAGNOSIS — Z792 Long term (current) use of antibiotics: Secondary | ICD-10-CM | POA: Diagnosis not present

## 2016-08-18 DIAGNOSIS — Z79891 Long term (current) use of opiate analgesic: Secondary | ICD-10-CM | POA: Diagnosis not present

## 2016-08-18 DIAGNOSIS — Z48815 Encounter for surgical aftercare following surgery on the digestive system: Secondary | ICD-10-CM | POA: Diagnosis not present

## 2016-08-18 DIAGNOSIS — J45909 Unspecified asthma, uncomplicated: Secondary | ICD-10-CM | POA: Diagnosis not present

## 2016-08-18 DIAGNOSIS — I2699 Other pulmonary embolism without acute cor pulmonale: Secondary | ICD-10-CM | POA: Diagnosis not present

## 2016-08-18 DIAGNOSIS — M1991 Primary osteoarthritis, unspecified site: Secondary | ICD-10-CM | POA: Diagnosis not present

## 2016-08-18 DIAGNOSIS — I10 Essential (primary) hypertension: Secondary | ICD-10-CM | POA: Diagnosis not present

## 2016-08-22 DIAGNOSIS — H524 Presbyopia: Secondary | ICD-10-CM | POA: Diagnosis not present

## 2016-08-22 DIAGNOSIS — H401131 Primary open-angle glaucoma, bilateral, mild stage: Secondary | ICD-10-CM | POA: Diagnosis not present

## 2016-08-22 DIAGNOSIS — H2513 Age-related nuclear cataract, bilateral: Secondary | ICD-10-CM | POA: Diagnosis not present

## 2016-08-22 DIAGNOSIS — J45909 Unspecified asthma, uncomplicated: Secondary | ICD-10-CM | POA: Diagnosis not present

## 2016-08-22 DIAGNOSIS — I10 Essential (primary) hypertension: Secondary | ICD-10-CM | POA: Diagnosis not present

## 2016-08-22 DIAGNOSIS — E782 Mixed hyperlipidemia: Secondary | ICD-10-CM | POA: Diagnosis not present

## 2016-08-22 DIAGNOSIS — Z7901 Long term (current) use of anticoagulants: Secondary | ICD-10-CM | POA: Diagnosis not present

## 2016-08-22 DIAGNOSIS — I2699 Other pulmonary embolism without acute cor pulmonale: Secondary | ICD-10-CM | POA: Diagnosis not present

## 2016-08-22 DIAGNOSIS — Z792 Long term (current) use of antibiotics: Secondary | ICD-10-CM | POA: Diagnosis not present

## 2016-08-22 DIAGNOSIS — Z48815 Encounter for surgical aftercare following surgery on the digestive system: Secondary | ICD-10-CM | POA: Diagnosis not present

## 2016-08-22 DIAGNOSIS — H5213 Myopia, bilateral: Secondary | ICD-10-CM | POA: Diagnosis not present

## 2016-08-22 DIAGNOSIS — Z79891 Long term (current) use of opiate analgesic: Secondary | ICD-10-CM | POA: Diagnosis not present

## 2016-08-22 DIAGNOSIS — M1991 Primary osteoarthritis, unspecified site: Secondary | ICD-10-CM | POA: Diagnosis not present

## 2016-08-26 DIAGNOSIS — E782 Mixed hyperlipidemia: Secondary | ICD-10-CM | POA: Diagnosis not present

## 2016-08-26 DIAGNOSIS — I2699 Other pulmonary embolism without acute cor pulmonale: Secondary | ICD-10-CM | POA: Diagnosis not present

## 2016-08-26 DIAGNOSIS — Z792 Long term (current) use of antibiotics: Secondary | ICD-10-CM | POA: Diagnosis not present

## 2016-08-26 DIAGNOSIS — Z79891 Long term (current) use of opiate analgesic: Secondary | ICD-10-CM | POA: Diagnosis not present

## 2016-08-26 DIAGNOSIS — M1991 Primary osteoarthritis, unspecified site: Secondary | ICD-10-CM | POA: Diagnosis not present

## 2016-08-26 DIAGNOSIS — Z48815 Encounter for surgical aftercare following surgery on the digestive system: Secondary | ICD-10-CM | POA: Diagnosis not present

## 2016-08-26 DIAGNOSIS — Z7901 Long term (current) use of anticoagulants: Secondary | ICD-10-CM | POA: Diagnosis not present

## 2016-08-26 DIAGNOSIS — J45909 Unspecified asthma, uncomplicated: Secondary | ICD-10-CM | POA: Diagnosis not present

## 2016-08-26 DIAGNOSIS — I10 Essential (primary) hypertension: Secondary | ICD-10-CM | POA: Diagnosis not present

## 2016-08-31 DIAGNOSIS — Z792 Long term (current) use of antibiotics: Secondary | ICD-10-CM | POA: Diagnosis not present

## 2016-08-31 DIAGNOSIS — Z79891 Long term (current) use of opiate analgesic: Secondary | ICD-10-CM | POA: Diagnosis not present

## 2016-08-31 DIAGNOSIS — Z7901 Long term (current) use of anticoagulants: Secondary | ICD-10-CM | POA: Diagnosis not present

## 2016-08-31 DIAGNOSIS — Z48815 Encounter for surgical aftercare following surgery on the digestive system: Secondary | ICD-10-CM | POA: Diagnosis not present

## 2016-08-31 DIAGNOSIS — J45909 Unspecified asthma, uncomplicated: Secondary | ICD-10-CM | POA: Diagnosis not present

## 2016-08-31 DIAGNOSIS — I2699 Other pulmonary embolism without acute cor pulmonale: Secondary | ICD-10-CM | POA: Diagnosis not present

## 2016-08-31 DIAGNOSIS — E782 Mixed hyperlipidemia: Secondary | ICD-10-CM | POA: Diagnosis not present

## 2016-08-31 DIAGNOSIS — I10 Essential (primary) hypertension: Secondary | ICD-10-CM | POA: Diagnosis not present

## 2016-08-31 DIAGNOSIS — M1991 Primary osteoarthritis, unspecified site: Secondary | ICD-10-CM | POA: Diagnosis not present

## 2016-09-02 DIAGNOSIS — I10 Essential (primary) hypertension: Secondary | ICD-10-CM | POA: Diagnosis not present

## 2016-09-02 DIAGNOSIS — J45909 Unspecified asthma, uncomplicated: Secondary | ICD-10-CM | POA: Diagnosis not present

## 2016-09-02 DIAGNOSIS — Z792 Long term (current) use of antibiotics: Secondary | ICD-10-CM | POA: Diagnosis not present

## 2016-09-02 DIAGNOSIS — R202 Paresthesia of skin: Secondary | ICD-10-CM | POA: Diagnosis not present

## 2016-09-02 DIAGNOSIS — I2699 Other pulmonary embolism without acute cor pulmonale: Secondary | ICD-10-CM | POA: Diagnosis not present

## 2016-09-02 DIAGNOSIS — R197 Diarrhea, unspecified: Secondary | ICD-10-CM | POA: Diagnosis not present

## 2016-09-02 DIAGNOSIS — Z48815 Encounter for surgical aftercare following surgery on the digestive system: Secondary | ICD-10-CM | POA: Diagnosis not present

## 2016-09-02 DIAGNOSIS — E782 Mixed hyperlipidemia: Secondary | ICD-10-CM | POA: Diagnosis not present

## 2016-09-02 DIAGNOSIS — Z7901 Long term (current) use of anticoagulants: Secondary | ICD-10-CM | POA: Diagnosis not present

## 2016-09-02 DIAGNOSIS — Z79891 Long term (current) use of opiate analgesic: Secondary | ICD-10-CM | POA: Diagnosis not present

## 2016-09-02 DIAGNOSIS — M1991 Primary osteoarthritis, unspecified site: Secondary | ICD-10-CM | POA: Diagnosis not present

## 2016-09-05 DIAGNOSIS — I2699 Other pulmonary embolism without acute cor pulmonale: Secondary | ICD-10-CM | POA: Diagnosis not present

## 2016-09-05 DIAGNOSIS — I10 Essential (primary) hypertension: Secondary | ICD-10-CM | POA: Diagnosis not present

## 2016-09-05 DIAGNOSIS — Z792 Long term (current) use of antibiotics: Secondary | ICD-10-CM | POA: Diagnosis not present

## 2016-09-05 DIAGNOSIS — Z79891 Long term (current) use of opiate analgesic: Secondary | ICD-10-CM | POA: Diagnosis not present

## 2016-09-05 DIAGNOSIS — M1991 Primary osteoarthritis, unspecified site: Secondary | ICD-10-CM | POA: Diagnosis not present

## 2016-09-05 DIAGNOSIS — Z7901 Long term (current) use of anticoagulants: Secondary | ICD-10-CM | POA: Diagnosis not present

## 2016-09-05 DIAGNOSIS — E782 Mixed hyperlipidemia: Secondary | ICD-10-CM | POA: Diagnosis not present

## 2016-09-05 DIAGNOSIS — Z48815 Encounter for surgical aftercare following surgery on the digestive system: Secondary | ICD-10-CM | POA: Diagnosis not present

## 2016-09-05 DIAGNOSIS — J45909 Unspecified asthma, uncomplicated: Secondary | ICD-10-CM | POA: Diagnosis not present

## 2016-09-06 DIAGNOSIS — E876 Hypokalemia: Secondary | ICD-10-CM | POA: Diagnosis not present

## 2016-09-08 DIAGNOSIS — J45909 Unspecified asthma, uncomplicated: Secondary | ICD-10-CM | POA: Diagnosis not present

## 2016-09-08 DIAGNOSIS — Z48815 Encounter for surgical aftercare following surgery on the digestive system: Secondary | ICD-10-CM | POA: Diagnosis not present

## 2016-09-08 DIAGNOSIS — Z792 Long term (current) use of antibiotics: Secondary | ICD-10-CM | POA: Diagnosis not present

## 2016-09-08 DIAGNOSIS — Z79891 Long term (current) use of opiate analgesic: Secondary | ICD-10-CM | POA: Diagnosis not present

## 2016-09-08 DIAGNOSIS — I10 Essential (primary) hypertension: Secondary | ICD-10-CM | POA: Diagnosis not present

## 2016-09-08 DIAGNOSIS — I2699 Other pulmonary embolism without acute cor pulmonale: Secondary | ICD-10-CM | POA: Diagnosis not present

## 2016-09-08 DIAGNOSIS — E782 Mixed hyperlipidemia: Secondary | ICD-10-CM | POA: Diagnosis not present

## 2016-09-08 DIAGNOSIS — Z7901 Long term (current) use of anticoagulants: Secondary | ICD-10-CM | POA: Diagnosis not present

## 2016-09-08 DIAGNOSIS — M1991 Primary osteoarthritis, unspecified site: Secondary | ICD-10-CM | POA: Diagnosis not present

## 2016-09-09 DIAGNOSIS — E876 Hypokalemia: Secondary | ICD-10-CM | POA: Diagnosis not present

## 2016-09-15 DIAGNOSIS — E876 Hypokalemia: Secondary | ICD-10-CM | POA: Diagnosis not present

## 2016-09-16 ENCOUNTER — Telehealth: Payer: Self-pay

## 2016-09-16 MED ORDER — LEVOCETIRIZINE DIHYDROCHLORIDE 5 MG PO TABS: 5.0000 mg | ORAL_TABLET | Freq: Every evening | ORAL | 3 refills | Status: DC

## 2016-09-16 NOTE — Telephone Encounter (Signed)
Spoke with pt. She states that Medicare will cover 30 tablets, rather than 60. A new rx was sent to the pharmacy for 5mg  daily, 30 tablets.

## 2016-09-16 NOTE — Telephone Encounter (Signed)
Left message to call back to advise pt that her insurance is not going to cover Xyzal. They denied the PA.

## 2016-09-22 DIAGNOSIS — E876 Hypokalemia: Secondary | ICD-10-CM | POA: Diagnosis not present

## 2016-10-10 DIAGNOSIS — D509 Iron deficiency anemia, unspecified: Secondary | ICD-10-CM | POA: Diagnosis not present

## 2016-10-10 DIAGNOSIS — Z0000 Encounter for general adult medical examination without abnormal findings: Secondary | ICD-10-CM | POA: Diagnosis not present

## 2016-10-10 DIAGNOSIS — E782 Mixed hyperlipidemia: Secondary | ICD-10-CM | POA: Diagnosis not present

## 2016-10-10 DIAGNOSIS — R197 Diarrhea, unspecified: Secondary | ICD-10-CM | POA: Diagnosis not present

## 2016-10-10 DIAGNOSIS — R7309 Other abnormal glucose: Secondary | ICD-10-CM | POA: Diagnosis not present

## 2016-10-10 DIAGNOSIS — K5900 Constipation, unspecified: Secondary | ICD-10-CM | POA: Diagnosis not present

## 2016-10-10 DIAGNOSIS — E559 Vitamin D deficiency, unspecified: Secondary | ICD-10-CM | POA: Diagnosis not present

## 2016-10-10 DIAGNOSIS — I10 Essential (primary) hypertension: Secondary | ICD-10-CM | POA: Diagnosis not present

## 2016-12-15 DIAGNOSIS — H401131 Primary open-angle glaucoma, bilateral, mild stage: Secondary | ICD-10-CM | POA: Diagnosis not present

## 2016-12-16 ENCOUNTER — Other Ambulatory Visit: Payer: Self-pay | Admitting: Allergy and Immunology

## 2016-12-16 MED ORDER — LEVOCETIRIZINE DIHYDROCHLORIDE 5 MG PO TABS: 5.0000 mg | ORAL_TABLET | Freq: Two times a day (BID) | ORAL | 3 refills | Status: DC | PRN

## 2016-12-19 ENCOUNTER — Telehealth: Payer: Self-pay

## 2016-12-19 MED ORDER — LEVOCETIRIZINE DIHYDROCHLORIDE 5 MG PO TABS: 5.0000 mg | ORAL_TABLET | Freq: Every evening | ORAL | 5 refills | Status: DC

## 2016-12-19 NOTE — Telephone Encounter (Signed)
Called patient. Left message in regards to Xyzal. We received a fax from [facility]  pharmacy in regards to a PA for Xyzal. We need to inform patient that she can get Xyzal OTC.

## 2016-12-19 NOTE — Telephone Encounter (Signed)
Patient called back. I informed her that her insurance wouldn't cover the xyzal and recommended her to get them OTC. Patient informed me that her insurance will cover if we sent a new prescription for Xyzal for 1 tablet daily. I sent in script.

## 2016-12-19 NOTE — Addendum Note (Signed)
Addended by: Marthann Schiller on: 12/19/2016 04:02 PM   Modules accepted: Orders

## 2017-01-09 DIAGNOSIS — J309 Allergic rhinitis, unspecified: Secondary | ICD-10-CM | POA: Diagnosis not present

## 2017-01-09 DIAGNOSIS — E782 Mixed hyperlipidemia: Secondary | ICD-10-CM | POA: Diagnosis not present

## 2017-01-09 DIAGNOSIS — D509 Iron deficiency anemia, unspecified: Secondary | ICD-10-CM | POA: Diagnosis not present

## 2017-01-09 DIAGNOSIS — R197 Diarrhea, unspecified: Secondary | ICD-10-CM | POA: Diagnosis not present

## 2017-01-09 DIAGNOSIS — H6121 Impacted cerumen, right ear: Secondary | ICD-10-CM | POA: Diagnosis not present

## 2017-01-09 DIAGNOSIS — K5900 Constipation, unspecified: Secondary | ICD-10-CM | POA: Diagnosis not present

## 2017-01-09 DIAGNOSIS — I10 Essential (primary) hypertension: Secondary | ICD-10-CM | POA: Diagnosis not present

## 2017-01-09 DIAGNOSIS — R7309 Other abnormal glucose: Secondary | ICD-10-CM | POA: Diagnosis not present

## 2017-01-23 ENCOUNTER — Other Ambulatory Visit: Payer: Self-pay | Admitting: Allergy and Immunology

## 2017-02-16 DIAGNOSIS — K5730 Diverticulosis of large intestine without perforation or abscess without bleeding: Secondary | ICD-10-CM | POA: Diagnosis not present

## 2017-02-16 DIAGNOSIS — D509 Iron deficiency anemia, unspecified: Secondary | ICD-10-CM | POA: Diagnosis not present

## 2017-02-23 ENCOUNTER — Other Ambulatory Visit: Payer: Self-pay | Admitting: Allergy and Immunology

## 2017-03-20 ENCOUNTER — Encounter: Payer: Self-pay | Admitting: Podiatry

## 2017-03-20 ENCOUNTER — Ambulatory Visit: Payer: Medicare Other | Admitting: Podiatry

## 2017-03-20 VITALS — BP 146/77 | HR 75

## 2017-03-20 DIAGNOSIS — Q828 Other specified congenital malformations of skin: Secondary | ICD-10-CM | POA: Diagnosis not present

## 2017-03-20 DIAGNOSIS — H401131 Primary open-angle glaucoma, bilateral, mild stage: Secondary | ICD-10-CM | POA: Diagnosis not present

## 2017-03-20 NOTE — Patient Instructions (Signed)
Today your exam demonstrated adequate circulation feeling in your feet. There is a nucleated corn-like growth on the bottom the right foot which is the source of your pain. These tend to regenerate any periodic trimming.

## 2017-03-20 NOTE — Progress Notes (Signed)
   Subjective:    Patient ID: Rhonda [name], female    DOB: [birth year], 66 y.o.   MRN: [id number]  HPI This patient presents today complaining of a painful plantar skin lesion that feels like a toothache on the right foot. The symptoms are aggravated with walking and standing and relieved with rest and elevation. Patient has reduced activity to accommodate to this area  Denies smoking history   Review of Systems  All other systems reviewed and are negative.      Objective:   Physical Exam  Orientated 3  Vascular: DP pulses 2/4 bilaterally PT pulses 2/4 bilaterally Reflex within normal lives bilaterally  Neurological: Sensation to 10 g monofilament wire intact 8/8 bilaterally Vibratory sensation reactive bilaterally Ankle reflexes reactive bilaterally  Dermatological: No open skin lesions bilaterally Well-organized nucleated plantar lesion lateral aspect of right foot. The surrounding tissue is fibrous. When the nucleated lesion is compressed this duplicates patient's discomfort  Musculoskeletal: Pes planus Manual motor testing dorsi flexion, plantar flexion, inversion, eversion 5/5 bilaterally      Assessment & Plan:   Assessment: Satisfactory neurovascular status Porokeratosis 1  Plan: Informed patient that the skin lesion was a nucleated corn like growth and were very difficult to eradicate in her basis. I recommended debridement and application salinocaine. Patient verbally consents The porokeratosis was debrided and packed with salinocaine Also discussed self treatment of this lesion with over-the-counter salicylic acid emphasizing not to exceed the recommended dosing  Reappoint at patient's request

## 2017-05-02 DIAGNOSIS — R7303 Prediabetes: Secondary | ICD-10-CM

## 2017-05-02 HISTORY — DX: Prediabetes: R73.03

## 2017-05-02 HISTORY — PX: EYE SURGERY: SHX253

## 2017-05-29 ENCOUNTER — Other Ambulatory Visit: Payer: Self-pay | Admitting: Family

## 2017-06-08 ENCOUNTER — Other Ambulatory Visit: Payer: Self-pay | Admitting: Family

## 2017-06-14 DIAGNOSIS — E559 Vitamin D deficiency, unspecified: Secondary | ICD-10-CM | POA: Diagnosis not present

## 2017-06-14 DIAGNOSIS — R7309 Other abnormal glucose: Secondary | ICD-10-CM | POA: Diagnosis not present

## 2017-06-14 DIAGNOSIS — E782 Mixed hyperlipidemia: Secondary | ICD-10-CM | POA: Diagnosis not present

## 2017-06-14 DIAGNOSIS — I10 Essential (primary) hypertension: Secondary | ICD-10-CM | POA: Diagnosis not present

## 2017-06-21 DIAGNOSIS — H401131 Primary open-angle glaucoma, bilateral, mild stage: Secondary | ICD-10-CM | POA: Diagnosis not present

## 2017-06-21 DIAGNOSIS — H2513 Age-related nuclear cataract, bilateral: Secondary | ICD-10-CM | POA: Diagnosis not present

## 2017-06-24 ENCOUNTER — Other Ambulatory Visit: Payer: Self-pay | Admitting: Allergy and Immunology

## 2017-07-26 ENCOUNTER — Other Ambulatory Visit: Payer: Self-pay | Admitting: Allergy and Immunology

## 2017-08-02 ENCOUNTER — Other Ambulatory Visit: Payer: Self-pay | Admitting: Allergy and Immunology

## 2017-08-07 DIAGNOSIS — R609 Edema, unspecified: Secondary | ICD-10-CM | POA: Diagnosis not present

## 2017-08-07 DIAGNOSIS — R197 Diarrhea, unspecified: Secondary | ICD-10-CM | POA: Diagnosis not present

## 2017-09-10 ENCOUNTER — Inpatient Hospital Stay (HOSPITAL_COMMUNITY)
Admission: EM | Admit: 2017-09-10 | Discharge: 2017-09-16 | DRG: 330 | Disposition: A | Discharge status: Home / Self Care | Payer: Medicare Other | Attending: General Surgery | Admitting: General Surgery

## 2017-09-10 ENCOUNTER — Emergency Department (HOSPITAL_COMMUNITY): Payer: Medicare Other

## 2017-09-10 ENCOUNTER — Encounter (HOSPITAL_COMMUNITY): Payer: Self-pay | Admitting: *Deleted

## 2017-09-10 ENCOUNTER — Other Ambulatory Visit: Payer: Self-pay

## 2017-09-10 DIAGNOSIS — E785 Hyperlipidemia, unspecified: Secondary | ICD-10-CM | POA: Diagnosis present

## 2017-09-10 DIAGNOSIS — I10 Essential (primary) hypertension: Secondary | ICD-10-CM | POA: Diagnosis not present

## 2017-09-10 DIAGNOSIS — K567 Ileus, unspecified: Secondary | ICD-10-CM | POA: Diagnosis not present

## 2017-09-10 DIAGNOSIS — Z86718 Personal history of other venous thrombosis and embolism: Secondary | ICD-10-CM

## 2017-09-10 DIAGNOSIS — R112 Nausea with vomiting, unspecified: Secondary | ICD-10-CM | POA: Diagnosis not present

## 2017-09-10 DIAGNOSIS — R1084 Generalized abdominal pain: Secondary | ICD-10-CM | POA: Diagnosis not present

## 2017-09-10 DIAGNOSIS — K562 Volvulus: Principal | ICD-10-CM | POA: Diagnosis present

## 2017-09-10 DIAGNOSIS — Z86711 Personal history of pulmonary embolism: Secondary | ICD-10-CM | POA: Diagnosis present

## 2017-09-10 DIAGNOSIS — E876 Hypokalemia: Secondary | ICD-10-CM | POA: Diagnosis present

## 2017-09-10 DIAGNOSIS — Z6841 Body Mass Index (BMI) 40.0 and over, adult: Secondary | ICD-10-CM

## 2017-09-10 DIAGNOSIS — K219 Gastro-esophageal reflux disease without esophagitis: Secondary | ICD-10-CM | POA: Diagnosis present

## 2017-09-10 DIAGNOSIS — R197 Diarrhea, unspecified: Secondary | ICD-10-CM | POA: Diagnosis not present

## 2017-09-10 DIAGNOSIS — Z79899 Other long term (current) drug therapy: Secondary | ICD-10-CM | POA: Diagnosis not present

## 2017-09-10 DIAGNOSIS — K432 Incisional hernia without obstruction or gangrene: Secondary | ICD-10-CM | POA: Diagnosis present

## 2017-09-10 DIAGNOSIS — R739 Hyperglycemia, unspecified: Secondary | ICD-10-CM | POA: Diagnosis not present

## 2017-09-10 DIAGNOSIS — K56609 Unspecified intestinal obstruction, unspecified as to partial versus complete obstruction: Secondary | ICD-10-CM

## 2017-09-10 DIAGNOSIS — Z886 Allergy status to analgesic agent status: Secondary | ICD-10-CM

## 2017-09-10 DIAGNOSIS — Z885 Allergy status to narcotic agent status: Secondary | ICD-10-CM

## 2017-09-10 DIAGNOSIS — K56600 Partial intestinal obstruction, unspecified as to cause: Secondary | ICD-10-CM | POA: Diagnosis not present

## 2017-09-10 DIAGNOSIS — E871 Hypo-osmolality and hyponatremia: Secondary | ICD-10-CM | POA: Diagnosis present

## 2017-09-10 DIAGNOSIS — N179 Acute kidney failure, unspecified: Secondary | ICD-10-CM | POA: Diagnosis not present

## 2017-09-10 DIAGNOSIS — Z981 Arthrodesis status: Secondary | ICD-10-CM | POA: Diagnosis not present

## 2017-09-10 DIAGNOSIS — R109 Unspecified abdominal pain: Secondary | ICD-10-CM | POA: Diagnosis not present

## 2017-09-10 LAB — COMPREHENSIVE METABOLIC PANEL
ALBUMIN: 4.4 g/dL (ref 3.5–5.0)
ALK PHOS: 78 U/L (ref 38–126)
ALT: 12 U/L — ABNORMAL LOW (ref 14–54)
ANION GAP: 14 (ref 5–15)
AST: 14 U/L — ABNORMAL LOW (ref 15–41)
BUN: 17 mg/dL (ref 6–20)
CALCIUM: 10.1 mg/dL (ref 8.9–10.3)
CO2: 28 mmol/L (ref 22–32)
Chloride: 92 mmol/L — ABNORMAL LOW (ref 101–111)
Creatinine, Ser: 1.12 mg/dL — ABNORMAL HIGH (ref 0.44–1.00)
GFR calc Af Amer: 57 mL/min — ABNORMAL LOW (ref >60)
GFR calc non Af Amer: 49 mL/min — ABNORMAL LOW (ref >60)
Glucose, Bld: 146 mg/dL — ABNORMAL HIGH (ref 65–99)
POTASSIUM: 3 mmol/L — AB (ref 3.5–5.1)
SODIUM: 134 mmol/L — AB (ref 135–145)
Total Bilirubin: 1.1 mg/dL (ref 0.3–1.2)
Total Protein: 7.9 g/dL (ref 6.5–8.1)

## 2017-09-10 LAB — CBC
HCT: 45.8 % (ref 36.0–46.0)
HEMOGLOBIN: 15.4 g/dL — AB (ref 12.0–15.0)
MCH: 28.2 pg (ref 26.0–34.0)
MCHC: 33.6 g/dL (ref 30.0–36.0)
MCV: 83.9 fL (ref 78.0–100.0)
Platelets: 424 10*3/uL — ABNORMAL HIGH (ref 150–400)
RBC: 5.46 MIL/uL — ABNORMAL HIGH (ref 3.87–5.11)
RDW: 13.9 % (ref 11.5–15.5)
WBC: 8.5 10*3/uL (ref 4.0–10.5)

## 2017-09-10 LAB — LIPASE, BLOOD: LIPASE: 33 U/L (ref 11–51)

## 2017-09-10 MED ORDER — SODIUM CHLORIDE 0.9 % IV BOLUS
500.0000 mL | Freq: Once | INTRAVENOUS | Status: AC
  Administered 2017-09-10: 500 mL via INTRAVENOUS

## 2017-09-10 MED ORDER — IOHEXOL 300 MG/ML  SOLN
100.0000 mL | Freq: Once | INTRAMUSCULAR | Status: AC | PRN
  Administered 2017-09-10: 100 mL via INTRAVENOUS

## 2017-09-10 MED ORDER — FENTANYL CITRATE (PF) 100 MCG/2ML IJ SOLN
25.0000 ug | Freq: Once | INTRAMUSCULAR | Status: AC
  Administered 2017-09-10: 25 ug via INTRAVENOUS
  Filled 2017-09-10: qty 2

## 2017-09-10 MED ORDER — ONDANSETRON HCL 4 MG/2ML IJ SOLN
4.0000 mg | Freq: Once | INTRAMUSCULAR | Status: AC
  Administered 2017-09-10: 4 mg via INTRAVENOUS
  Filled 2017-09-10: qty 2

## 2017-09-10 NOTE — ED Provider Notes (Signed)
  Face-to-face evaluation   History: Abdominal pain with intermittent vomiting.  Physical exam: Alert elderly female who appears comfortable.  Abdomen somewhat distended, irregular in consistency periumbilically without discrete palpable mass or hernia.  Mild midline tenderness.  Medical screening examination/treatment/procedure(s) were conducted as a shared visit with non-physician practitioner(s) and myself.  I personally evaluated the patient during the encounter    Mancel Bale, MD 09/12/17 1002
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
## 2017-09-10 NOTE — ED Triage Notes (Signed)
The pt is c/o abd pain for one week  Last bm was Thursday  She was just sent from ucc after getting n abd series that showed a sbo

## 2017-09-11 ENCOUNTER — Encounter (HOSPITAL_COMMUNITY): Admission: EM | Disposition: A | Discharge status: Home / Self Care | Payer: Self-pay | Source: Home / Self Care

## 2017-09-11 ENCOUNTER — Encounter (HOSPITAL_COMMUNITY): Payer: Self-pay | Admitting: Internal Medicine

## 2017-09-11 ENCOUNTER — Emergency Department (HOSPITAL_COMMUNITY): Payer: Medicare Other | Admitting: Anesthesiology

## 2017-09-11 DIAGNOSIS — K660 Peritoneal adhesions (postprocedural) (postinfection): Secondary | ICD-10-CM | POA: Diagnosis not present

## 2017-09-11 DIAGNOSIS — Z79899 Other long term (current) drug therapy: Secondary | ICD-10-CM | POA: Diagnosis not present

## 2017-09-11 DIAGNOSIS — E876 Hypokalemia: Secondary | ICD-10-CM | POA: Diagnosis present

## 2017-09-11 DIAGNOSIS — R739 Hyperglycemia, unspecified: Secondary | ICD-10-CM | POA: Diagnosis present

## 2017-09-11 DIAGNOSIS — I10 Essential (primary) hypertension: Secondary | ICD-10-CM | POA: Diagnosis not present

## 2017-09-11 DIAGNOSIS — Z981 Arthrodesis status: Secondary | ICD-10-CM | POA: Diagnosis not present

## 2017-09-11 DIAGNOSIS — Z86718 Personal history of other venous thrombosis and embolism: Secondary | ICD-10-CM | POA: Diagnosis not present

## 2017-09-11 DIAGNOSIS — K56609 Unspecified intestinal obstruction, unspecified as to partial versus complete obstruction: Secondary | ICD-10-CM | POA: Diagnosis present

## 2017-09-11 DIAGNOSIS — E785 Hyperlipidemia, unspecified: Secondary | ICD-10-CM | POA: Diagnosis present

## 2017-09-11 DIAGNOSIS — Z885 Allergy status to narcotic agent status: Secondary | ICD-10-CM | POA: Diagnosis not present

## 2017-09-11 DIAGNOSIS — Z886 Allergy status to analgesic agent status: Secondary | ICD-10-CM | POA: Diagnosis not present

## 2017-09-11 DIAGNOSIS — Z86711 Personal history of pulmonary embolism: Secondary | ICD-10-CM | POA: Diagnosis not present

## 2017-09-11 DIAGNOSIS — Z6841 Body Mass Index (BMI) 40.0 and over, adult: Secondary | ICD-10-CM | POA: Diagnosis not present

## 2017-09-11 DIAGNOSIS — K56600 Partial intestinal obstruction, unspecified as to cause: Secondary | ICD-10-CM | POA: Diagnosis not present

## 2017-09-11 DIAGNOSIS — K567 Ileus, unspecified: Secondary | ICD-10-CM | POA: Diagnosis not present

## 2017-09-11 DIAGNOSIS — E871 Hypo-osmolality and hyponatremia: Secondary | ICD-10-CM | POA: Diagnosis not present

## 2017-09-11 DIAGNOSIS — K219 Gastro-esophageal reflux disease without esophagitis: Secondary | ICD-10-CM | POA: Diagnosis present

## 2017-09-11 DIAGNOSIS — K562 Volvulus: Secondary | ICD-10-CM | POA: Diagnosis present

## 2017-09-11 DIAGNOSIS — N179 Acute kidney failure, unspecified: Secondary | ICD-10-CM | POA: Diagnosis not present

## 2017-09-11 DIAGNOSIS — J45909 Unspecified asthma, uncomplicated: Secondary | ICD-10-CM | POA: Diagnosis not present

## 2017-09-11 DIAGNOSIS — K432 Incisional hernia without obstruction or gangrene: Secondary | ICD-10-CM | POA: Diagnosis present

## 2017-09-11 HISTORY — PX: LAPAROTOMY: SHX154

## 2017-09-11 HISTORY — PX: INCISIONAL HERNIA REPAIR: SHX193

## 2017-09-11 HISTORY — PX: EXPLORATORY LAPAROTOMY: SUR591

## 2017-09-11 HISTORY — PX: LYSIS OF ADHESION: SHX5961

## 2017-09-11 LAB — URINALYSIS, ROUTINE W REFLEX MICROSCOPIC
Bacteria, UA: NONE SEEN
Bilirubin Urine: NEGATIVE
GLUCOSE, UA: NEGATIVE mg/dL
KETONES UR: 5 mg/dL — AB
NITRITE: NEGATIVE
PH: 5 (ref 5.0–8.0)
PROTEIN: 100 mg/dL — AB
Specific Gravity, Urine: >1.046 — ABNORMAL HIGH (ref 1.005–1.030)

## 2017-09-11 LAB — CBC
HCT: 42.1 % (ref 36.0–46.0)
HEMATOCRIT: 39.2 % (ref 36.0–46.0)
HEMOGLOBIN: 14.2 g/dL (ref 12.0–15.0)
Hemoglobin: 13.3 g/dL (ref 12.0–15.0)
MCH: 28.4 pg (ref 26.0–34.0)
MCH: 28.5 pg (ref 26.0–34.0)
MCHC: 33.7 g/dL (ref 30.0–36.0)
MCHC: 33.9 g/dL (ref 30.0–36.0)
MCV: 83.9 fL (ref 78.0–100.0)
MCV: 84.2 fL (ref 78.0–100.0)
PLATELETS: 335 10*3/uL (ref 150–400)
Platelets: 405 10*3/uL — ABNORMAL HIGH (ref 150–400)
RBC: 4.67 MIL/uL (ref 3.87–5.11)
RBC: 5 MIL/uL (ref 3.87–5.11)
RDW: 14.1 % (ref 11.5–15.5)
RDW: 14.5 % (ref 11.5–15.5)
WBC: 20.9 10*3/uL — AB (ref 4.0–10.5)
WBC: 13.2 10*3/uL — AB (ref 4.0–10.5)

## 2017-09-11 LAB — BASIC METABOLIC PANEL
Anion gap: 13 (ref 5–15)
BUN: 25 mg/dL — ABNORMAL HIGH (ref 6–20)
CHLORIDE: 97 mmol/L — AB (ref 101–111)
CO2: 27 mmol/L (ref 22–32)
Calcium: 9 mg/dL (ref 8.9–10.3)
Creatinine, Ser: 1.37 mg/dL — ABNORMAL HIGH (ref 0.44–1.00)
GFR calc Af Amer: 45 mL/min — ABNORMAL LOW (ref >60)
GFR calc non Af Amer: 39 mL/min — ABNORMAL LOW (ref >60)
Glucose, Bld: 176 mg/dL — ABNORMAL HIGH (ref 65–99)
Potassium: 2.8 mmol/L — ABNORMAL LOW (ref 3.5–5.1)
SODIUM: 137 mmol/L (ref 135–145)

## 2017-09-11 LAB — GLUCOSE, CAPILLARY
GLUCOSE-CAPILLARY: 192 mg/dL — AB (ref 65–99)
Glucose-Capillary: 168 mg/dL — ABNORMAL HIGH (ref 65–99)
Glucose-Capillary: 154 mg/dL — ABNORMAL HIGH (ref 65–99)
Glucose-Capillary: 142 mg/dL — ABNORMAL HIGH (ref 65–99)

## 2017-09-11 SURGERY — LAPAROTOMY, EXPLORATORY
Anesthesia: General

## 2017-09-11 MED ORDER — FENTANYL CITRATE (PF) 100 MCG/2ML IJ SOLN
INTRAMUSCULAR | Status: DC | PRN
  Administered 2017-09-11: 50 ug via INTRAVENOUS
  Administered 2017-09-11: 75 ug via INTRAVENOUS
  Administered 2017-09-11 (×3): 50 ug via INTRAVENOUS
  Administered 2017-09-11: 25 ug via INTRAVENOUS

## 2017-09-11 MED ORDER — DEXAMETHASONE SODIUM PHOSPHATE 10 MG/ML IJ SOLN
INTRAMUSCULAR | Status: DC | PRN
Start: 2017-09-11 — End: 2017-09-11
  Administered 2017-09-11: 10 mg via INTRAVENOUS

## 2017-09-11 MED ORDER — PHENYLEPHRINE HCL 10 MG/ML IJ SOLN
INTRAVENOUS | Status: DC | PRN
  Administered 2017-09-11: 75 ug/min via INTRAVENOUS

## 2017-09-11 MED ORDER — FENTANYL CITRATE (PF) 100 MCG/2ML IJ SOLN
50.0000 ug | Freq: Once | INTRAMUSCULAR | Status: AC
  Administered 2017-09-11: 50 ug via INTRAVENOUS

## 2017-09-11 MED ORDER — SUGAMMADEX SODIUM 500 MG/5ML IV SOLN
INTRAVENOUS | Status: DC | PRN
  Administered 2017-09-11: 240 mg via INTRAVENOUS

## 2017-09-11 MED ORDER — PROPOFOL 10 MG/ML IV BOLUS
INTRAVENOUS | Status: DC | PRN
  Administered 2017-09-11: 150 mg via INTRAVENOUS

## 2017-09-11 MED ORDER — MORPHINE SULFATE (PF) 4 MG/ML IV SOLN
2.0000 mg | INTRAVENOUS | Status: DC | PRN
  Administered 2017-09-11 – 2017-09-13 (×8): 2 mg via INTRAVENOUS
  Filled 2017-09-11 (×8): qty 1

## 2017-09-11 MED ORDER — MIDAZOLAM HCL 5 MG/5ML IJ SOLN
INTRAMUSCULAR | Status: DC | PRN
  Administered 2017-09-11 (×2): 1 mg via INTRAVENOUS

## 2017-09-11 MED ORDER — ONDANSETRON 4 MG PO TBDP: 4.0000 mg | ORAL_TABLET | Freq: Four times a day (QID) | ORAL | Status: DC | PRN

## 2017-09-11 MED ORDER — KCL IN DEXTROSE-NACL 20-5-0.45 MEQ/L-%-% IV SOLN
INTRAVENOUS | Status: DC
Start: 2017-09-11 — End: 2017-09-16
  Administered 2017-09-11 – 2017-09-15 (×9): via INTRAVENOUS
  Filled 2017-09-11 (×8): qty 1000

## 2017-09-11 MED ORDER — LIDOCAINE HCL (CARDIAC) PF 100 MG/5ML IV SOSY
PREFILLED_SYRINGE | INTRAVENOUS | Status: DC | PRN
  Administered 2017-09-11: 100 mg via INTRAVENOUS

## 2017-09-11 MED ORDER — KCL IN DEXTROSE-NACL 20-5-0.45 MEQ/L-%-% IV SOLN
INTRAVENOUS | Status: AC
  Filled 2017-09-11: qty 1000

## 2017-09-11 MED ORDER — ONDANSETRON HCL 4 MG/2ML IJ SOLN
4.0000 mg | Freq: Four times a day (QID) | INTRAMUSCULAR | Status: DC | PRN
  Administered 2017-09-13 – 2017-09-15 (×4): 4 mg via INTRAVENOUS
  Filled 2017-09-11 (×4): qty 2

## 2017-09-11 MED ORDER — HYDROCODONE-ACETAMINOPHEN 5-325 MG PO TABS: 1.0000 | ORAL_TABLET | ORAL | Status: DC | PRN

## 2017-09-11 MED ORDER — FENTANYL CITRATE (PF) 100 MCG/2ML IJ SOLN
INTRAMUSCULAR | Status: AC
  Filled 2017-09-11: qty 2

## 2017-09-11 MED ORDER — INSULIN ASPART 100 UNIT/ML ~~LOC~~ SOLN
0.0000 [IU] | Freq: Three times a day (TID) | SUBCUTANEOUS | Status: DC
Start: 2017-09-11 — End: 2017-09-16
  Administered 2017-09-11 – 2017-09-12 (×4): 3 [IU] via SUBCUTANEOUS
  Administered 2017-09-12 – 2017-09-13 (×3): 2 [IU] via SUBCUTANEOUS

## 2017-09-11 MED ORDER — ARTIFICIAL TEARS OPHTHALMIC OINT
TOPICAL_OINTMENT | OPHTHALMIC | Status: DC | PRN
  Administered 2017-09-11: 1 via OPHTHALMIC

## 2017-09-11 MED ORDER — DEXAMETHASONE SODIUM PHOSPHATE 10 MG/ML IJ SOLN
INTRAMUSCULAR | Status: AC
  Filled 2017-09-11: qty 1

## 2017-09-11 MED ORDER — TRAMADOL HCL 50 MG PO TABS: 50.0000 mg | ORAL_TABLET | Freq: Four times a day (QID) | ORAL | Status: DC | PRN

## 2017-09-11 MED ORDER — ALBUTEROL SULFATE (2.5 MG/3ML) 0.083% IN NEBU: 3.0000 mL | INHALATION_SOLUTION | Freq: Four times a day (QID) | RESPIRATORY_TRACT | Status: DC | PRN

## 2017-09-11 MED ORDER — SUCCINYLCHOLINE CHLORIDE 20 MG/ML IJ SOLN
INTRAMUSCULAR | Status: DC | PRN
  Administered 2017-09-11: 120 mg via INTRAVENOUS

## 2017-09-11 MED ORDER — 0.9 % SODIUM CHLORIDE (POUR BTL) OPTIME
TOPICAL | Status: DC | PRN
  Administered 2017-09-11 (×2): 1000 mL

## 2017-09-11 MED ORDER — METHOCARBAMOL 1000 MG/10ML IJ SOLN
500.0000 mg | Freq: Three times a day (TID) | INTRAVENOUS | Status: AC
  Administered 2017-09-11 – 2017-09-12 (×6): 500 mg via INTRAVENOUS
  Filled 2017-09-11 (×6): qty 5

## 2017-09-11 MED ORDER — HEPARIN SODIUM (PORCINE) 5000 UNIT/ML IJ SOLN
5000.0000 [IU] | Freq: Once | INTRAMUSCULAR | Status: AC
  Administered 2017-09-11: 5000 [IU] via SUBCUTANEOUS
  Filled 2017-09-11: qty 1

## 2017-09-11 MED ORDER — ENOXAPARIN SODIUM 40 MG/0.4ML ~~LOC~~ SOLN
40.0000 mg | SUBCUTANEOUS | Status: DC
  Administered 2017-09-12 – 2017-09-16 (×5): 40 mg via SUBCUTANEOUS
  Filled 2017-09-11 (×5): qty 0.4

## 2017-09-11 MED ORDER — ONDANSETRON HCL 4 MG/2ML IJ SOLN
INTRAMUSCULAR | Status: DC | PRN
  Administered 2017-09-11: 4 mg via INTRAVENOUS

## 2017-09-11 MED ORDER — FENTANYL CITRATE (PF) 250 MCG/5ML IJ SOLN
INTRAMUSCULAR | Status: AC
  Filled 2017-09-11: qty 5

## 2017-09-11 MED ORDER — PROPOFOL 10 MG/ML IV BOLUS
INTRAVENOUS | Status: AC
  Filled 2017-09-11: qty 40

## 2017-09-11 MED ORDER — ACETAMINOPHEN 10 MG/ML IV SOLN
1000.0000 mg | Freq: Four times a day (QID) | INTRAVENOUS | Status: AC
  Administered 2017-09-11 (×4): 1000 mg via INTRAVENOUS
  Filled 2017-09-11 (×4): qty 100

## 2017-09-11 MED ORDER — ROCURONIUM BROMIDE 100 MG/10ML IV SOLN
INTRAVENOUS | Status: DC | PRN
  Administered 2017-09-11: 50 mg via INTRAVENOUS
  Administered 2017-09-11: 10 mg via INTRAVENOUS

## 2017-09-11 MED ORDER — ONDANSETRON HCL 4 MG/2ML IJ SOLN
INTRAMUSCULAR | Status: AC
  Filled 2017-09-11: qty 2

## 2017-09-11 MED ORDER — SUGAMMADEX SODIUM 500 MG/5ML IV SOLN
INTRAVENOUS | Status: AC
Start: 2017-09-11 — End: ?
  Filled 2017-09-11: qty 5

## 2017-09-11 MED ORDER — VASOPRESSIN 20 UNIT/ML IV SOLN
INTRAVENOUS | Status: AC
  Filled 2017-09-11: qty 1

## 2017-09-11 MED ORDER — SODIUM CHLORIDE 0.9 % IV SOLN
2.0000 g | Freq: Once | INTRAVENOUS | Status: AC
  Administered 2017-09-11: 2 g via INTRAVENOUS
  Filled 2017-09-11: qty 2

## 2017-09-11 MED ORDER — DIPHENHYDRAMINE HCL 50 MG/ML IJ SOLN: 12.5000 mg | Freq: Four times a day (QID) | INTRAMUSCULAR | Status: DC | PRN

## 2017-09-11 MED ORDER — LACTATED RINGERS IV SOLN
INTRAVENOUS | Status: DC | PRN
  Administered 2017-09-11 (×2): via INTRAVENOUS

## 2017-09-11 MED ORDER — FENTANYL CITRATE (PF) 100 MCG/2ML IJ SOLN
25.0000 ug | INTRAMUSCULAR | Status: DC | PRN
  Administered 2017-09-11 (×3): 50 ug via INTRAVENOUS

## 2017-09-11 MED ORDER — PROMETHAZINE HCL 25 MG/ML IJ SOLN: 6.2500 mg | INTRAMUSCULAR | Status: DC | PRN

## 2017-09-11 MED ORDER — DIPHENHYDRAMINE HCL 12.5 MG/5ML PO ELIX: 12.5000 mg | ORAL_SOLUTION | Freq: Four times a day (QID) | ORAL | Status: DC | PRN

## 2017-09-11 MED ORDER — MIDAZOLAM HCL 2 MG/2ML IJ SOLN
INTRAMUSCULAR | Status: AC
  Filled 2017-09-11: qty 2

## 2017-09-11 MED ORDER — VASOPRESSIN 20 UNIT/ML IV SOLN
INTRAVENOUS | Status: DC | PRN
  Administered 2017-09-11: 1 [IU] via INTRAVENOUS

## 2017-09-11 MED ORDER — ORAL CARE MOUTH RINSE
15.0000 mL | Freq: Two times a day (BID) | OROMUCOSAL | Status: DC
  Administered 2017-09-11 – 2017-09-15 (×6): 15 mL via OROMUCOSAL

## 2017-09-11 SURGICAL SUPPLY — 42 items
CHLORAPREP W/TINT 26ML (MISCELLANEOUS) ×4 IMPLANT
CLIP VESOCCLUDE LG 6/CT (CLIP) ×4 IMPLANT
CLIP VESOCCLUDE MED 6/CT (CLIP) ×4 IMPLANT
CLIP VESOCCLUDE SM WIDE 6/CT (CLIP) ×4 IMPLANT
COVER SURGICAL LIGHT HANDLE (MISCELLANEOUS) ×4 IMPLANT
DRAPE LAPAROSCOPIC ABDOMINAL (DRAPES) ×4 IMPLANT
DRAPE WARM FLUID 44X44 (DRAPE) ×4 IMPLANT
DRSG OPSITE POSTOP 4X10 (GAUZE/BANDAGES/DRESSINGS) ×2 IMPLANT
DRSG OPSITE POSTOP 4X8 (GAUZE/BANDAGES/DRESSINGS) IMPLANT
ELECT BLADE 6.5 EXT (BLADE) IMPLANT
ELECT CAUTERY BLADE 6.4 (BLADE) ×4 IMPLANT
ELECT REM PT RETURN 9FT ADLT (ELECTROSURGICAL) ×4
ELECTRODE REM PT RTRN 9FT ADLT (ELECTROSURGICAL) ×2 IMPLANT
GLOVE BIOGEL PI IND STRL 7.0 (GLOVE) ×2 IMPLANT
GLOVE BIOGEL PI INDICATOR 7.0 (GLOVE) ×2
GLOVE SURG SS PI 7.0 STRL IVOR (GLOVE) ×4 IMPLANT
GOWN STRL REUS W/ TWL LRG LVL3 (GOWN DISPOSABLE) ×4 IMPLANT
GOWN STRL REUS W/TWL LRG LVL3 (GOWN DISPOSABLE) ×8
KIT BASIN OR (CUSTOM PROCEDURE TRAY) ×4 IMPLANT
LIGASURE IMPACT 36 18CM CVD LR (INSTRUMENTS) IMPLANT
LOOP VESSEL MAXI BLUE (MISCELLANEOUS) IMPLANT
LOOP VESSEL MINI RED (MISCELLANEOUS) IMPLANT
MESH ULTRAPRO 12X12 30X30CM (Mesh General) ×2 IMPLANT
NEEDLE 22X1 1/2 (OR ONLY) (NEEDLE) ×4 IMPLANT
PACK GENERAL/GYN (CUSTOM PROCEDURE TRAY) ×4 IMPLANT
RETAINER VISCERA MED (MISCELLANEOUS) ×2 IMPLANT
SPONGE LAP 18X18 X RAY DECT (DISPOSABLE) IMPLANT
STAPLER VISISTAT 35W (STAPLE) ×4 IMPLANT
SUCTION POOLE TIP (SUCTIONS) ×4 IMPLANT
SUT PDS AB 0 CT 36 (SUTURE) ×14 IMPLANT
SUT PDS AB 1 TP1 96 (SUTURE) IMPLANT
SUT PROLENE 2 0 CT2 30 (SUTURE) ×4 IMPLANT
SUT SILK 2 0 (SUTURE) ×4
SUT SILK 2 0 SH CR/8 (SUTURE) ×4 IMPLANT
SUT SILK 2-0 18XBRD TIE 12 (SUTURE) ×2 IMPLANT
SUT SILK 3 0 (SUTURE) ×4
SUT SILK 3 0 SH CR/8 (SUTURE) ×4 IMPLANT
SUT SILK 3-0 18XBRD TIE 12 (SUTURE) ×2 IMPLANT
SUT VIC AB 3-0 SH 18 (SUTURE) ×2 IMPLANT
TOWEL OR 17X26 10 PK STRL BLUE (TOWEL DISPOSABLE) ×4 IMPLANT
TRAY FOLEY MTR SLVR 16FR STAT (SET/KITS/TRAYS/PACK) ×4 IMPLANT
YANKAUER SUCT BULB TIP NO VENT (SUCTIONS) IMPLANT

## 2017-09-11 NOTE — ED Notes (Signed)
Also sent urine culture to lab

## 2017-09-11 NOTE — Care Management Note (Signed)
Case Management Note  Patient Details  Name: Rhonda [name] MRN: [id number] Date of Birth: 08-27-1950  Subjective/Objective:                    Action/Plan:  2/18 for open ventral hernia repair with resultant PE ,was placed on Eliquis and continued Eliquis for 6 months DX small intestine volvulus,  5-13 exp lap ,LOA, incisional hernia repair   Will continue to follow for discharge needs  Expected Discharge Date:                  Expected Discharge Plan:     In-House Referral:     Discharge planning Services     Post Acute Care Choice:    Choice offered to:     DME Arranged:    DME Agency:     HH Arranged:    HH Agency:     Status of Service:  In process, will continue to follow  If discussed at Long Length of Stay Meetings, dates discussed:    Additional Comments:  Kingsley Plan, RN 09/11/2017, 4:30 PM

## 2017-09-11 NOTE — ED Notes (Signed)
OR ready; short stay 36

## 2017-09-11 NOTE — Evaluation (Addendum)
Physical Therapy Evaluation Patient Details Name: Rhonda [name] MRN: [id number] DOB: Dec 24, 1950 Today's Date: 09/11/2017   History of Present Illness  67 y.o. female with PMHx: HTN; HLD; h/o anemia; and complicated admission in 2/18 for open ventral hernia repair with resultant PE. Admitted with small intestinal volvulus, now s/p ex lap, lysis of adhesions, enterorrhaphy, and incisional hernia repair 5/13  Clinical Impression  Pt very pleasant and in chair on arrival but willing to move and walk limited distance. 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 Pt with report of dizziness for the last 5 days when getting up and moving, at times reports a spinning sensation. Pt able to perform saccades and tracking without difficulty or symptoms. Pt with decreased activity tolerance, gait and functional mobility who will benefit from acute therapy to maximize gait and independence.   BP in standing 100/72    Follow Up Recommendations Home health PT    Equipment Recommendations  None recommended by PT    Recommendations for Other Services       Precautions / Restrictions Precautions Precaution Comments: NGT      Mobility  Bed Mobility               General bed mobility comments: in chair on arrival  Transfers Overall transfer level: Needs assistance   Transfers: Sit to/from Stand Sit to Stand: Min guard         General transfer comment: cues for hand placement  Ambulation/Gait Ambulation/Gait assistance: Min guard Ambulation Distance (Feet): 30 Feet Assistive device: Rolling walker (2 wheeled) Gait Pattern/deviations: Step-through pattern;Decreased stride length;Trunk flexed   Gait velocity interpretation: <1.8 ft/sec, indicate of risk for recurrent falls General Gait Details: cues for posture with chair to follow. Pt walked 30' then 10' with seated rest between. Pt reports feeling lightheaded without drop in BP or  nystagmus during gait  Stairs            Wheelchair Mobility    Modified Rankin (Stroke Patients Only)       Balance Overall balance assessment: Mild deficits observed, not formally tested                                           Pertinent Vitals/Pain Pain Assessment: 0-10 Pain Score: 5  Pain Location: abdominal  Pain Descriptors / Indicators: Operative site guarding;Sore Pain Intervention(s): Limited activity within patient's tolerance;Repositioned;Premedicated before session;Monitored during session    Home Living Family/patient expects to be discharged to:: Private residence Living Arrangements: Parent Available Help at Discharge: Family;Available PRN/intermittently Type of Home: House Home Access: Stairs to enter Entrance Stairs-Rails: Right;Left Entrance Stairs-Number of Steps: 5 Home Layout: Two level;Able to live on main level with bedroom/bathroom Home Equipment: Dan Humphreys - 2 wheels;Bedside commode;Shower seat      Prior Function Level of Independence: Independent               Hand Dominance        Extremity/Trunk Assessment   Upper Extremity Assessment Upper Extremity Assessment: Generalized weakness    Lower Extremity Assessment Lower Extremity Assessment: Generalized weakness    Cervical / Trunk Assessment Cervical / Trunk Assessment: Kyphotic  Communication   Communication: No difficulties  Cognition Arousal/Alertness: Awake/alert Behavior During Therapy: WFL for tasks assessed/performed Overall Cognitive Status: Within Functional Limits for tasks assessed  General Comments      Exercises     Assessment/Plan    PT Assessment Patient needs continued PT services  PT Problem List Decreased mobility;Decreased activity tolerance;Decreased knowledge of use of DME       PT Treatment Interventions Gait training;Therapeutic exercise;Patient/family  education;Stair training;Functional mobility training;DME instruction;Therapeutic activities    PT Goals (Current goals can be found in the Care Plan section)  Acute Rehab PT Goals Patient Stated Goal: return home PT Goal Formulation: With patient Time For Goal Achievement: 09/25/17 Potential to Achieve Goals: Good    Frequency Min 3X/week   Barriers to discharge   pt reports good family support with 24hr care    Co-evaluation               AM-PAC PT "6 Clicks" Daily Activity  Outcome Measure Difficulty turning over in bed (including adjusting bedclothes, sheets and blankets)?: Unable Difficulty moving from lying on back to sitting on the side of the bed? : Unable Difficulty sitting down on and standing up from a chair with arms (e.g., wheelchair, bedside commode, etc,.)?: A Little Help needed moving to and from a bed to chair (including a wheelchair)?: A Little Help needed walking in hospital room?: A Little Help needed climbing 3-5 steps with a railing? : A Little 6 Click Score: 14    End of Session Equipment Utilized During Treatment: Gait belt Activity Tolerance: Patient tolerated treatment well Patient left: in chair;with call bell/phone within reach Nurse Communication: Mobility status PT Visit Diagnosis: Other abnormalities of gait and mobility (R26.89);Muscle weakness (generalized) (M62.81)    Time: 1308-6578 PT Time Calculation (min) (ACUTE ONLY): 29 min   Charges:   PT Evaluation $PT Eval Moderate Complexity: 1 Mod PT Treatments $Gait Training: 8-22 mins   PT G Codes:        Delaney Meigs, PT [phone]   Nelli Swalley B Rudra Hobbins 09/11/2017, 1:34 PM

## 2017-09-11 NOTE — Transfer of Care (Signed)
Immediate Anesthesia Transfer of Care Note  Patient: Rhonda [name]  Procedure(s) Performed: EXPLORATORY LAPAROTOMY (N/A ) LYSIS OF ADHESION HERNIA REPAIR INCISIONAL  Patient Location: PACU  Anesthesia Type:General  Level of Consciousness: awake and alert   Airway & Oxygen Therapy: Patient Spontanous Breathing and Patient connected to nasal cannula oxygen  Post-op Assessment: Report given to RN and Post -op Vital signs reviewed and stable  Post vital signs: Reviewed and stable  Last Vitals:  Vitals Value Taken Time  BP 132/64 09/11/2017  3:42 AM  Temp 37.1 C 09/11/2017  3:30 AM  Pulse 95 09/11/2017  3:44 AM  Resp 18 09/11/2017  3:44 AM  SpO2 96 % 09/11/2017  3:44 AM  Vitals shown include unvalidated device data.  Last Pain:  Vitals:   09/10/17 2223  TempSrc:   PainSc: 1          Complications: No apparent anesthesia complications

## 2017-09-11 NOTE — ED Notes (Signed)
Kinsinger, MD at bedside

## 2017-09-11 NOTE — Progress Notes (Signed)
Day of Surgery   Subjective/Chief Complaint: Sore otherwise ok wants to know what was found at  surgery   Objective: Vital signs in last 24 hours: Temp:  [97.5 F (36.4 C)-98.7 F (37.1 C)] 97.5 F (36.4 C) (05/13 0512) Pulse Rate:  [84-114] 84 (05/13 0512) Resp:  [14-20] 17 (05/13 0448) BP: (105-133)/(62-79) 106/66 (05/13 0512) SpO2:  [93 %-100 %] 100 % (05/13 0512) Weight:  [120.7 kg (266 lb)] 120.7 kg (266 lb) (05/12 1745) Last BM Date: 09/07/17  Intake/Output from previous day: 05/12 0701 - 05/13 0700 In: 2128.8 [I.V.:1628.8; IV Piggyback:500] Out: 230 [Urine:155; Emesis/NG output:25; Blood:50] Intake/Output this shift: No intake/output data recorded.  General appearance: no distress Resp: clear to auscultation bilaterally Cardio: regular rate and rhythm GI: no bs approp tender wound clean Extremities: edema none  Lab Results:  Recent Labs    09/10/17 1751 09/11/17 0527  WBC 8.5 20.9*  HGB 15.4* 14.2  HCT 45.8 42.1  PLT 424* 405*   BMET Recent Labs    09/10/17 1751 09/11/17 0527  NA 134* 137  K 3.0* 2.8*  CL 92* 97*  CO2 28 27  GLUCOSE 146* 176*  BUN 17 25*  CREATININE 1.12* 1.37*  CALCIUM 10.1 9.0   PT/INR No results for input(s): LABPROT, INR in the last 72 hours. ABG No results for input(s): PHART, HCO3 in the last 72 hours.  Invalid input(s): PCO2, PO2  Studies/Results: Ct Abdomen Pelvis W Contrast  Result Date: 09/10/2017 CLINICAL DATA:  Acute onset of generalized abdominal pain and lack of bowel movements. EXAM: CT ABDOMEN AND PELVIS WITH CONTRAST TECHNIQUE: Multidetector CT imaging of the abdomen and pelvis was performed using the standard protocol following bolus administration of intravenous contrast. CONTRAST:  OMNIPAQUE IOHEXOL 300 MG/ML  SOLN COMPARISON:  CT of the abdomen and pelvis from 05/13/2016 FINDINGS: Lower chest: Mild left basilar scarring is noted. The visualized portions of the mediastinum are unremarkable. A tiny  hiatal hernia is noted. Hepatobiliary: The liver is unremarkable in appearance. The gallbladder is unremarkable in appearance. The common bile duct measures 8 mm, borderline prominent for the patient's age. Pancreas: The pancreas is within normal limits. Spleen: The spleen is unremarkable in appearance. Adrenals/Urinary Tract: The adrenal glands are unremarkable in appearance. Mild bilateral renal scarring is noted. A small right renal cyst is noted. There is no evidence of hydronephrosis. No renal or ureteral stones are identified. No perinephric stranding is seen. Stomach/Bowel: There is dilatation of small-bowel loops to 5.2 cm in diameter. This appears to reflect a combination of mesenteric volvulus at the right lower quadrant, with associated mesenteric edema and twisting of small bowel loops, and obstruction of herniated small bowel loops at the level of a moderate to large anterior abdominal wall hernia, which also contains a decompressed loop of transverse colon. There is a suspected underlying element of closed loop obstruction, though no definite evidence of bowel ischemia is yet seen at this time. The stomach is grossly unremarkable in appearance. The colon is largely decompressed, with scattered diverticulosis noted along the entirety of the colon. There is no evidence of diverticulitis. Vascular/Lymphatic: Scattered calcification is seen along the abdominal aorta and its branches. The abdominal aorta is otherwise grossly unremarkable. The inferior vena cava is grossly unremarkable. No retroperitoneal lymphadenopathy is seen. No pelvic sidewall lymphadenopathy is identified. Mildly prominent nodes are noted at the site of mesenteric volvulus. Reproductive: The bladder is decompressed and not well characterized. Calcified uterine fibroids are seen. The ovaries are grossly  symmetric. No suspicious adnexal masses are seen. Other: No additional soft tissue abnormalities are seen. Musculoskeletal: No acute  osseous abnormalities are identified. The patient is status post lumbar spinal fusion at L5-S1, with underlying decompression. The visualized musculature is unremarkable in appearance. IMPRESSION: 1. High-grade small bowel obstruction, with dilatation of small-bowel loops to 5.2 cm in diameter. This appears to reflect a combination of mesenteric volvulus at the right lower quadrant, with associated mesenteric edema and twisting of small-bowel loops, and obstruction of herniated small bowel loops at the level of a moderate to large anterior abdominal wall hernia; the hernia also contains a decompressed loop of transverse colon. 2. Suspect underlying element of closed-loop obstruction; no definite bowel ischemia yet seen at this time, though mesenteric edema is noted as described above. 3. Mildly prominent nodes seen at the site of mesenteric volvulus. 4. Scattered diverticulosis along the entirety of the colon, without evidence of diverticulitis. 5. Calcified uterine fibroids noted. 6. Mild bilateral renal scarring.  Small right renal cyst. 7. Tiny hiatal hernia noted. Aortic Atherosclerosis (ICD10-I70.0). These results were called by telephone at the time of interpretation on 09/10/2017 at 9:29 pm to [facility] PA, who verbally acknowledged these results. Electronically Signed   By: Roanna Raider M.D.   On: 09/10/2017 21:31    Anti-infectives: Anti-infectives (From admission, onward)   Start     Dose/Rate Route Frequency Ordered Stop   09/11/17 0045  cefoTEtan (CEFOTAN) 2 g in sodium chloride 0.9 % 100 mL IVPB     2 g 200 mL/hr over 30 Minutes Intravenous  Once 09/11/17 0039 09/11/17 0123      Assessment/Plan: DOS elap with loa- LK Prior history of VTE  1. Neuro- will do ofirmev for 24 hours, robaxin and prn morphine for pain control 2. CV/Pulm- pulm toilet, ics, oob today 3. Renal- continue foley today, recheck cr in am tomorrow 4. GI- await ileus to resolve, continue ng tube today 5.  Heme- continue scds and lovenox, will ask medicine to see for recs given prior VTE   Emelia Loron 09/11/2017

## 2017-09-11 NOTE — Progress Notes (Signed)
Received patient from PACU with NGT at right nare, foley catheter, AOx3, VS stable, O2Sat at 100% on 1L/min via [place], pain at 3/10, oriented to room, bed controls and call. Patient now resting on bed with both eyes closed. Will monitor.
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
## 2017-09-11 NOTE — Anesthesia Procedure Notes (Signed)
Procedure Name: Intubation Date/Time: 09/11/2017 1:26 AM Performed by: Edmonia Caprio, CRNA Pre-anesthesia Checklist: Patient identified, Emergency Drugs available, Suction available, Patient being monitored and Timeout performed Patient Re-evaluated:Patient Re-evaluated prior to induction Oxygen Delivery Method: Circle system utilized Preoxygenation: Pre-oxygenation with 100% oxygen Induction Type: IV induction and Rapid sequence Laryngoscope Size: Miller and 2 Grade View: Grade I Tube type: Subglottic suction tube Tube size: 7.0 mm Number of attempts: 1 Airway Equipment and Method: Stylet Placement Confirmation: positive ETCO2,  ETT inserted through vocal cords under direct vision and breath sounds checked- equal and bilateral Secured at: 21 cm Tube secured with: Tape Dental Injury: Teeth and Oropharynx as per pre-operative assessment
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
## 2017-09-11 NOTE — Anesthesia Postprocedure Evaluation (Signed)
Anesthesia Post Note  Patient: Rhonda [name]  Procedure(s) Performed: EXPLORATORY LAPAROTOMY (N/A ) LYSIS OF ADHESION HERNIA REPAIR INCISIONAL     Patient location during evaluation: PACU Anesthesia Type: General Level of consciousness: awake and alert Pain management: pain level controlled Vital Signs Assessment: post-procedure vital signs reviewed and stable Respiratory status: spontaneous breathing, nonlabored ventilation, respiratory function stable and patient connected to nasal cannula oxygen Cardiovascular status: blood pressure returned to baseline and stable Postop Assessment: no apparent nausea or vomiting Anesthetic complications: no    Last Vitals:  Vitals:   09/11/17 0448 09/11/17 0512  BP: 107/72 106/66  Pulse: 90 84  Resp: 17   Temp: 36.6 C (!) 36.4 C  SpO2: 98% 100%    Last Pain:  Vitals:   09/11/17 0515  TempSrc:   PainSc: 3                  Cecile Hearing

## 2017-09-11 NOTE — Op Note (Signed)
Preoperative diagnosis: small intestine volvulus  Postoperative diagnosis: same   Procedure: exploratory laparotomy, lysis of adhesions, enterorrhaphy, incisional hernia repair   Surgeon: Feliciana Rossetti, M.D.  Asst: none  Anesthesia: general  Indications for procedure: Rhonda [name] is a 68 y.o. year old female with symptoms of abdominal pain nausea and vomiting. CT scan showed small bowel obstruction with concern for volvulus with closed loop obstruction and posterior rectus fascia free causing a hernia.  Description of procedure: The patient was brought into the operative suite. Anesthesia was administered with General endotracheal anesthesia. WHO checklist was applied. The patient was then placed in supine position. The area was prepped and draped in the usual sterile fashion.  A midline incision was made. Cautery was used to dissect through the subcutaneous layers and anterior fascia. A large amount of loculated serous collections was identified and removed. The mesh was encountered and divided in the midline. The left rectus fascial edge was midline, there was no evidence of right rectus fascial edge and the intestine was dilated in the area. 2 interloop adhesions were lysed allowing the intestine to devolvulize. The intestine was adhered to the right rectus fascia which was identified retracted laterally. Adhesions were remove with scissors and cautery. The small intestine was run from the ligament of trietz to the cecum and was free of adhesions. There was one area of serosal tear which was repaired with 3-0 vicryl lembert sutures.   A Phasix ST mesh was placed in the abdomen and sutured to the right and left posterior rectus fascia with 0 PDS in interrupted fashion to close the inner fascial layer. Next, a 2-0 prolene was used to re-appose the polypropylene mesh. The mesh was sutured to the anterior and posterior rectus sheath in a few positions as well. The anterior rectus sheath was  closed with 0 PDS in running fashion. Staples were used to close skin. All counts were correct. The patient was brought to pacu in stable condition.  Findings: right posterior rectus sheath pulled laterally with intestine adhered to the rectus and the unprotected mesh  Specimen: none  Implant: Phasix ST mesh   Blood loss:  Local anesthesia: none  Complications: none  Feliciana Rossetti, M.D. General, Bariatric, & Minimally Invasive Surgery [facility]

## 2017-09-11 NOTE — Anesthesia Preprocedure Evaluation (Signed)
Anesthesia Evaluation  Patient identified by MRN, date of birth, ID band Patient awake    Reviewed: Allergy & Precautions, NPO status , Patient's Chart, lab work & pertinent test results  Airway Mallampati: II  TM Distance: >3 FB Neck ROM: Full    Dental  (+) Poor Dentition, Missing   Pulmonary neg pulmonary ROS,    Pulmonary exam normal breath sounds clear to auscultation       Cardiovascular hypertension, Pt. on medications Normal cardiovascular exam Rhythm:Regular Rate:Normal     Neuro/Psych  Headaches, negative neurological ROS     GI/Hepatic Neg liver ROS, GERD  Medicated,CT Abdomen: mesenteric volvulus and obstruction of herniated small bowel loops at the level of a moderate to large anterior abdominal wall hernia.    Endo/Other  Morbid obesity  Renal/GU negative Renal ROS     Musculoskeletal  (+) Arthritis ,   Abdominal   Peds  Hematology negative hematology ROS (+)   Anesthesia Other Findings Day of surgery medications reviewed with the patient.  Reproductive/Obstetrics                             Anesthesia Physical Anesthesia Plan  ASA: III and emergent  Anesthesia Plan: General   Post-op Pain Management:    Induction: Intravenous and Rapid sequence  PONV Risk Score and Plan: 4 or greater and Midazolam, Dexamethasone and Ondansetron  Airway Management Planned: Oral ETT  Additional Equipment:   Intra-op Plan:   Post-operative Plan: Possible Post-op intubation/ventilation  Informed Consent: I have reviewed the patients History and Physical, chart, labs and discussed the procedure including the risks, benefits and alternatives for the proposed anesthesia with the patient or authorized representative who has indicated his/her understanding and acceptance.   Dental advisory given  Plan Discussed with: CRNA  Anesthesia Plan Comments: (2nd IV after induction, possible  Arterial line.)        Anesthesia Quick Evaluation

## 2017-09-12 ENCOUNTER — Encounter (HOSPITAL_COMMUNITY): Payer: Self-pay | Admitting: General Practice

## 2017-09-12 ENCOUNTER — Other Ambulatory Visit: Payer: Self-pay

## 2017-09-12 LAB — GLUCOSE, CAPILLARY
GLUCOSE-CAPILLARY: 123 mg/dL — AB (ref 65–99)
GLUCOSE-CAPILLARY: 120 mg/dL — AB (ref 65–99)
Glucose-Capillary: 164 mg/dL — ABNORMAL HIGH (ref 65–99)
Glucose-Capillary: 118 mg/dL — ABNORMAL HIGH (ref 65–99)

## 2017-09-12 LAB — BASIC METABOLIC PANEL
ANION GAP: 12 (ref 5–15)
BUN: 28 mg/dL — ABNORMAL HIGH (ref 6–20)
CALCIUM: 8.7 mg/dL — AB (ref 8.9–10.3)
CO2: 28 mmol/L (ref 22–32)
CREATININE: 1.28 mg/dL — AB (ref 0.44–1.00)
Chloride: 96 mmol/L — ABNORMAL LOW (ref 101–111)
GFR, EST AFRICAN AMERICAN: 49 mL/min — AB (ref >60)
GFR, EST NON AFRICAN AMERICAN: 42 mL/min — AB (ref >60)
Glucose, Bld: 136 mg/dL — ABNORMAL HIGH (ref 65–99)
Potassium: 3.2 mmol/L — ABNORMAL LOW (ref 3.5–5.1)
SODIUM: 136 mmol/L (ref 135–145)

## 2017-09-12 LAB — HEMOGLOBIN A1C
Hgb A1c MFr Bld: 5.7 % — ABNORMAL HIGH (ref 4.8–5.6)
Mean Plasma Glucose: 116.89 mg/dL

## 2017-09-12 MED ORDER — POTASSIUM CHLORIDE 10 MEQ/100ML IV SOLN
10.0000 meq | INTRAVENOUS | Status: AC
  Administered 2017-09-12 (×4): 10 meq via INTRAVENOUS
  Filled 2017-09-12 (×4): qty 100

## 2017-09-12 MED ORDER — ACETAMINOPHEN 10 MG/ML IV SOLN
1000.0000 mg | Freq: Four times a day (QID) | INTRAVENOUS | Status: AC
  Administered 2017-09-12 – 2017-09-13 (×4): 1000 mg via INTRAVENOUS
  Filled 2017-09-12 (×4): qty 100

## 2017-09-12 NOTE — Consult Note (Signed)
[facility] CM Primary Care Navigator  09/12/2017  Rhonda Rhonda [name] 1951/04/23 Rhonda [name]   Met with patientand daughter Rhonda Rhonda [name]) at the [place] identify possible discharge needs. Patientreports having"nausea/ vomiting, diarrhea intermittent abdominal pain and chills"thathadled to this admission/ surgery.(small intestinal volvulus, status post exploratory laparotomy)  PatientendorsesDr. Glendale [name] with [facility] asher primary care provider.   Patientshared Barrister's clerk [address] Roadto obtainmedications withoutdifficulty.   Patientstatesthat shemanagesher own medications at [employer] use of "pill box" system filled once a week.  Patient reports thatshehas been driving prior to admission/ surgery buther daughter or brother Rhonda Rhonda [name]) will providetransportation to her doctors'appointments after discharge.  Patient verbalized that she was physically active and independent prior to this admission and being the caregiver of her 68 year old mother who lives with her. She states that her daughter and her brother will be her primary caregivers at home, once discharged. Daughter verbalized that she will be able to stay at her mother's house to assist with her needs while she recovers.   Anticipatedplan for dischargeishomewith home health services per patient.  Patient and daughter voiced understanding to call primary care provider's office whenshereturns home,for a post discharge follow-up visit within1- 2 weeksor sooner if needs arise.Patient letter (with PCP's contact number) was provided asareminder.  Explained topatient and daughterregarding[facility] CM services available for health managementat homebutshedeniesanyneeds or concernsfor this time. Patientand daughter expressedunderstandingto seek referral from primary care provider to [facility] care management ifnecessaryand deemed appropriate  for anyservices in the future.  [facility] care management information provided for future needs that patient may have.  Patienthowever,verbally agreedand optedforEMMIcalls tofollowup herrecoveryat home.   Referral made for [facility] General calls after discharge.   For additional questions please contact:  Edwena Felty A. Ajel, BSN, RN-BC [facility] PRIMARY CARE Navigator Cell: [phone]  [facility] CM Primary Care Navigator  09/12/2017  Rhonda Rhonda [name] 68/18/1952 [id number]   Met with patientand daughter (Rhonda [name]) at the [facility] identify possible discharge needs. Patientreports having"nausea/ vomiting, diarrhea intermittent abdominal pain and chills"thathadled to this admission/ surgery.(small intestinal volvulus, status post exploratory laparotomy)  PatientendorsesDr. Robyn Rhonda [name] with [facility] asher primary care provider.   Patientshared [address] pharmacyon  [address]to obtainmedications withoutdifficulty.   Patientstatesthat shemanagesher own medications at homewith use of "pill box" system filled once a week.  Patient reports thatshehas been driving prior to admission/ surgery buther daughter or brother (Rhonda [name]) will providetransportation to her doctors'appointments after discharge.  Patient verbalized that she was physically active and independent prior to this admission and being the caregiver of her 90 year old mother who lives with her. She states that her daughter and her brother will be her primary caregivers at home, once discharged. Daughter verbalized that she will be able to stay at her mother's house to assist with her needs while she recovers.   Anticipatedplan for dischargeishomewith home health services per patient.  Patient and daughter voiced understanding to call primary care provider's office whenshereturns home,for a post discharge follow-up visit within1- 2 weeksor sooner if needs arise.Patient letter (with PCP's contact number) was provided asareminder.  Explained topatient and daughterregardingTHN CM services available for health managementat homebutshedeniesanyneeds or concernsfor this time. Patientand daughter expressedunderstandingto seek referral from primary care provider to [facility] care management ifnecessaryand deemed appropriate  for anyservices in the future.  [facility] care management information provided for future needs that patient may have.  Patienthowever,verbally agreedand optedforEMMIcalls tofollowup herrecoveryat home.   Referral made for [facility] General calls after discharge.   For additional questions please contact:  Lorraine A. Ajel, BSN, RN-BC [facility] PRIMARY CARE Navigator Cell: [phone] 

## 2017-09-12 NOTE — Progress Notes (Signed)
[facility] Progress Note  1 Day Post-Op  Subjective: CC-  Sitting up in chair. Patient states that she was having some abdominal pain this morning requiring IV morphine; this helped the pain but now she is feeling loopy. Denies n/v. No flatus or BM. She is burping. NG tube with minimal output 55cc/24hr. Ambulated yesterday.  PT recommending HH PT.  Objective: Vital signs in last 24 hours: Temp:  [97.6 F (36.4 C)-98 F (36.7 C)] 97.7 F (36.5 C) (05/14 0624) Pulse Rate:  [84-100] 99 (05/14 0624) Resp:  [17-18] 17 (05/14 0624) BP: (100-120)/(55-72) 117/60 (05/14 0624) SpO2:  [96 %-98 %] 98 % (05/14 0624) Last BM Date: 09/07/17  Intake/Output from previous day: 05/13 0701 - 05/14 0700 In: 3030 [P.O.:60; I.V.:2350; IV Piggyback:620] Out: 805 [Urine:750; Emesis/NG output:55] Intake/Output this shift: No intake/output data recorded.  PE: Gen:  Alert, NAD, pleasant HEENT: EOM's intact, pupils equal and round Card:  RRR, no M/G/R heard Pulm:  CTAB, no W/R/R, effort normal Abd: Soft, mild distension, few BS heard, midline incision cdi with honeycomb dressing in place Ext:  Calves soft and nontender Psych: A&Ox3  Skin: no rashes noted, warm and dry  Lab Results:  Recent Labs    09/11/17 0527 09/11/17 2345  WBC 20.9* 13.2*  HGB 14.2 13.3  HCT 42.1 39.2  PLT 405* 335   BMET Recent Labs    09/11/17 0527 09/12/17 0201  NA 137 136  K 2.8* 3.2*  CL 97* 96*  CO2 27 28  GLUCOSE 176* 136*  BUN 25* 28*  CREATININE 1.37* 1.28*  CALCIUM 9.0 8.7*   PT/INR No results for input(s): LABPROT, INR in the last 72 hours. CMP     Component Value Date/Time   NA 136 09/12/2017 0201   K 3.2 (L) 09/12/2017 0201   CL 96 (L) 09/12/2017 0201   CO2 28 09/12/2017 0201   GLUCOSE 136 (H) 09/12/2017 0201   BUN 28 (H) 09/12/2017 0201   CREATININE 1.28 (H) 09/12/2017 0201   CALCIUM 8.7 (L) 09/12/2017 0201   PROT 7.9 09/10/2017 1751   ALBUMIN 4.4 09/10/2017 1751   AST  14 (L) 09/10/2017 1751   ALT 12 (L) 09/10/2017 1751   ALKPHOS 78 09/10/2017 1751   BILITOT 1.1 09/10/2017 1751   GFRNONAA 42 (L) 09/12/2017 0201   GFRAA 49 (L) 09/12/2017 0201   Lipase     Component Value Date/Time   LIPASE 33 09/10/2017 1751       Studies/Results: Ct Abdomen Pelvis W Contrast  Result Date: 09/10/2017 CLINICAL DATA:  Acute onset of generalized abdominal pain and lack of bowel movements. EXAM: CT ABDOMEN AND PELVIS WITH CONTRAST TECHNIQUE: Multidetector CT imaging of the abdomen and pelvis was performed using the standard protocol following bolus administration of intravenous contrast. CONTRAST:  OMNIPAQUE IOHEXOL 300 MG/ML  SOLN COMPARISON:  CT of the abdomen and pelvis from 05/13/2016 FINDINGS: Lower chest: Mild left basilar scarring is noted. The visualized portions of the mediastinum are unremarkable. A tiny hiatal hernia is noted. Hepatobiliary: The liver is unremarkable in appearance. The gallbladder is unremarkable in appearance. The common bile duct measures 8 mm, borderline prominent for the patient's age. Pancreas: The pancreas is within normal limits. Spleen: The spleen is unremarkable in appearance. Adrenals/Urinary Tract: The adrenal glands are unremarkable in appearance. Mild bilateral renal scarring is noted. A small right renal cyst is noted. There is no evidence of hydronephrosis. No renal or ureteral stones are identified. No perinephric stranding is  seen. Stomach/Bowel: There is dilatation of small-bowel loops to 5.2 cm in diameter. This appears to reflect a combination of mesenteric volvulus at the right lower quadrant, with associated mesenteric edema and twisting of small bowel loops, and obstruction of herniated small bowel loops at the level of a moderate to large anterior abdominal wall hernia, which also contains a decompressed loop of transverse colon. There is a suspected underlying element of closed loop obstruction, though no definite evidence of  bowel ischemia is yet seen at this time. The stomach is grossly unremarkable in appearance. The colon is largely decompressed, with scattered diverticulosis noted along the entirety of the colon. There is no evidence of diverticulitis. Vascular/Lymphatic: Scattered calcification is seen along the abdominal aorta and its branches. The abdominal aorta is otherwise grossly unremarkable. The inferior vena cava is grossly unremarkable. No retroperitoneal lymphadenopathy is seen. No pelvic sidewall lymphadenopathy is identified. Mildly prominent nodes are noted at the site of mesenteric volvulus. Reproductive: The bladder is decompressed and not well characterized. Calcified uterine fibroids are seen. The ovaries are grossly symmetric. No suspicious adnexal masses are seen. Other: No additional soft tissue abnormalities are seen. Musculoskeletal: No acute osseous abnormalities are identified. The patient is status post lumbar spinal fusion at L5-S1, with underlying decompression. The visualized musculature is unremarkable in appearance. IMPRESSION: 1. High-grade small bowel obstruction, with dilatation of small-bowel loops to 5.2 cm in diameter. This appears to reflect a combination of mesenteric volvulus at the right lower quadrant, with associated mesenteric edema and twisting of small-bowel loops, and obstruction of herniated small bowel loops at the level of a moderate to large anterior abdominal wall hernia; the hernia also contains a decompressed loop of transverse colon. 2. Suspect underlying element of closed-loop obstruction; no definite bowel ischemia yet seen at this time, though mesenteric edema is noted as described above. 3. Mildly prominent nodes seen at the site of mesenteric volvulus. 4. Scattered diverticulosis along the entirety of the colon, without evidence of diverticulitis. 5. Calcified uterine fibroids noted. 6. Mild bilateral renal scarring.  Small right renal cyst. 7. Tiny hiatal hernia noted.  Aortic Atherosclerosis (ICD10-I70.0). These results were called by telephone at the time of interpretation on 09/10/2017 at 9:29 pm to [facility] PA, who verbally acknowledged these results. Electronically Signed   By: Roanna Raider M.D.   On: 09/10/2017 21:31    Anti-infectives: Anti-infectives (From admission, onward)   Start     Dose/Rate Route Frequency Ordered Stop   09/11/17 0045  cefoTEtan (CEFOTAN) 2 g in sodium chloride 0.9 % 100 mL IVPB     2 g 200 mL/hr over 30 Minutes Intravenous  Once 09/11/17 0039 09/11/17 0123       Assessment/Plan HTN HLD GERD H/o PE - plan to continue lovenox x30 days after discharge  Small intestine volvulus S/p exploratory laparotomy, lysis of adhesions, enterorrhaphy, incisional hernia repair 5/13 Dr. Sheliah Hatch - POD 1 - no BM or flatus  ID - cefotetan x1 periop FEN - IVF, NPO/NGT VTE - SCDs, lovenox. Plan to continue lovenox x30 at discharge Foley - d/c today Follow up - Dr. Sheliah Hatch  Plan - D/c foley. Replace potassium. Continue NPO/NGT and await bowel function. Continue scheduled IV tylenol and IV robaxin for pain control while NPO. Continue PT and ambulation. Labs in AM.   LOS: 1 day    Franne Forts , [facility] 09/12/2017, 9:14 AM Pager: [phone] Consults: [phone] Mon-Fri 7:00 am-4:30 pm Sat-Sun 7:00 am-11:30 am

## 2017-09-12 NOTE — Care Management Note (Signed)
Case Management Note  Patient Details  Name: [name] MRN: [id number] Date of Birth: 10-18-1950  Subjective/Objective:                    Action/Plan:  Order for home health PT . Spoke to patient's daughter at bedside . Patient has had home health before daughter unsure of name of agency . Patient asleep at present, NCM will follow up later. Expected Discharge Date:                  Expected Discharge Plan:  Home w Home Health Services  In-House Referral:     Discharge planning Services  CM Consult  Post Acute Care Choice:  Home Health Choice offered to:  Adult Children  DME Arranged:  N/A DME Agency:  NA  HH Arranged:  PT HH Agency:     Status of Service:  In process, will continue to follow  If discussed at Long Length of Stay Meetings, dates discussed:    Additional Comments:  Kingsley Plan, RN 09/12/2017, 1:30 PM

## 2017-09-13 LAB — BASIC METABOLIC PANEL
Anion gap: 12 (ref 5–15)
BUN: 20 mg/dL (ref 6–20)
CALCIUM: 8.6 mg/dL — AB (ref 8.9–10.3)
CO2: 24 mmol/L (ref 22–32)
Chloride: 97 mmol/L — ABNORMAL LOW (ref 101–111)
Creatinine, Ser: 0.85 mg/dL (ref 0.44–1.00)
GLUCOSE: 142 mg/dL — AB (ref 65–99)
Potassium: 3.7 mmol/L (ref 3.5–5.1)
SODIUM: 133 mmol/L — AB (ref 135–145)

## 2017-09-13 LAB — CBC
HCT: 37.5 % (ref 36.0–46.0)
Hemoglobin: 12.2 g/dL (ref 12.0–15.0)
MCH: 27.9 pg (ref 26.0–34.0)
MCHC: 32.5 g/dL (ref 30.0–36.0)
MCV: 85.6 fL (ref 78.0–100.0)
PLATELETS: 316 10*3/uL (ref 150–400)
RBC: 4.38 MIL/uL (ref 3.87–5.11)
RDW: 14.5 % (ref 11.5–15.5)
WBC: 11.2 10*3/uL — AB (ref 4.0–10.5)

## 2017-09-13 LAB — GLUCOSE, CAPILLARY
GLUCOSE-CAPILLARY: 131 mg/dL — AB (ref 65–99)
Glucose-Capillary: 122 mg/dL — ABNORMAL HIGH (ref 65–99)
Glucose-Capillary: 95 mg/dL (ref 65–99)
Glucose-Capillary: 122 mg/dL — ABNORMAL HIGH (ref 65–99)

## 2017-09-13 MED ORDER — PHENOL 1.4 % MT LIQD
1.0000 | OROMUCOSAL | Status: DC | PRN
  Filled 2017-09-13: qty 177

## 2017-09-13 MED ORDER — FLUTICASONE PROPIONATE 50 MCG/ACT NA SUSP
2.0000 | Freq: Every day | NASAL | Status: DC | PRN
  Administered 2017-09-13 – 2017-09-14 (×2): 2 via NASAL
  Filled 2017-09-13: qty 16

## 2017-09-13 MED ORDER — CETIRIZINE HCL 10 MG PO TABS
10.0000 mg | ORAL_TABLET | Freq: Every evening | ORAL | Status: DC
  Administered 2017-09-13 – 2017-09-15 (×3): 10 mg via ORAL
  Filled 2017-09-13 (×4): qty 1

## 2017-09-13 MED ORDER — ACETAMINOPHEN 10 MG/ML IV SOLN
1000.0000 mg | Freq: Four times a day (QID) | INTRAVENOUS | Status: AC
  Administered 2017-09-13 – 2017-09-14 (×4): 1000 mg via INTRAVENOUS
  Filled 2017-09-13 (×4): qty 100

## 2017-09-13 MED ORDER — DORZOLAMIDE HCL-TIMOLOL MAL 2-0.5 % OP SOLN
1.0000 [drp] | Freq: Two times a day (BID) | OPHTHALMIC | Status: DC
  Administered 2017-09-13 – 2017-09-16 (×7): 1 [drp] via OPHTHALMIC
  Filled 2017-09-13: qty 10

## 2017-09-13 NOTE — Progress Notes (Signed)
Physical Therapy Treatment Patient Details Name: Rhonda [name] MRN: [id number] DOB: December 23, 1950 Today's Date: 09/13/2017    History of Present Illness 67 y.o. female with PMHx: HTN; HLD; h/o anemia; and complicated admission in 2/18 for open ventral hernia repair with resultant PE. Admitted with small intestinal volvulus, now s/p ex lap, lysis of adhesions, enterorrhaphy, and incisional hernia repair 5/13    PT Comments    Patient progressing with therapy this visit, focused on stair training. Min guard at this time, utilizing BUE support on stairs with 1 rail. Walking 75', safer with RW at this time. Daughter not present but discussed having her supervision to ensure safety at home entry, pt agrees at this time. Will cont to progress as tolerated.    Follow Up Recommendations  Home health PT     Equipment Recommendations  None recommended by PT    Recommendations for Other Services       Precautions / Restrictions Restrictions Weight Bearing Restrictions: No    Mobility  Bed Mobility Overal bed mobility: Modified Independent             General bed mobility comments: walking back from bathroom solo upon entry  Transfers Overall transfer level: Needs assistance Equipment used: None;1 person hand held assist;Rolling walker (2 wheeled) Transfers: Sit to/from Stand Sit to Stand: Min guard         General transfer comment: min guard for safety able to rise without AD  Ambulation/Gait Ambulation/Gait assistance: Min guard;Supervision Ambulation Distance (Feet): 75 Feet Assistive device: Rolling walker (2 wheeled);1 person hand held assist Gait Pattern/deviations: Step-through pattern;Decreased stride length;Trunk flexed Gait velocity: decreased   General Gait Details: chair follow for safety, patient ambulating with RW with supervision, without with min guard, utilizies side railing on hallway for 1UE support.    Stairs Stairs: Yes Stairs assistance: Min  guard Stair Management: One rail Left;Forwards;Sideways;Step to pattern Number of Stairs: 12 General stair comments: patient trialed stairs forwards and sideways, cues for safety and technique. Min guard at this time, advised patient to have daugter present to help. pt safer and also prefers sideways with BUE support.    Wheelchair Mobility    Modified Rankin (Stroke Patients Only)       Balance Overall balance assessment: Mild deficits observed, not formally tested                                          Cognition Arousal/Alertness: Awake/alert Behavior During Therapy: WFL for tasks assessed/performed Overall Cognitive Status: Within Functional Limits for tasks assessed                                        Exercises      General Comments        Pertinent Vitals/Pain Pain Assessment: 0-10 Pain Score: 6  Pain Location: abdominal  Pain Descriptors / Indicators: Operative site guarding;Sore Pain Intervention(s): Limited activity within patient's tolerance;Monitored during session;Premedicated before session;Repositioned    Home Living                      Prior Function            PT Goals (current goals can now be found in the care plan section) Acute Rehab PT Goals Patient Stated Goal: return  home PT Goal Formulation: With patient Time For Goal Achievement: 09/25/17 Potential to Achieve Goals: Good Progress towards PT goals: Progressing toward goals    Frequency    Min 3X/week      PT Plan Current plan remains appropriate    Co-evaluation              AM-PAC PT "6 Clicks" Daily Activity  Outcome Measure  Difficulty turning over in bed (including adjusting bedclothes, sheets and blankets)?: Unable Difficulty moving from lying on back to sitting on the side of the bed? : Unable Difficulty sitting down on and standing up from a chair with arms (e.g., wheelchair, bedside commode, etc,.)?: Unable Help  needed moving to and from a bed to chair (including a wheelchair)?: A Little Help needed walking in hospital room?: A Little Help needed climbing 3-5 steps with a railing? : A Little 6 Click Score: 12    End of Session Equipment Utilized During Treatment: Gait belt Activity Tolerance: Patient tolerated treatment well Patient left: in chair;with call bell/phone within reach Nurse Communication: Mobility status PT Visit Diagnosis: Other abnormalities of gait and mobility (R26.89);Muscle weakness (generalized) (M62.81)     Time: 1730-1750 PT Time Calculation (min) (ACUTE ONLY): 20 min  Charges:  $Gait Training: 8-22 mins                    G Codes:       Etta Grandchild, PT, DPT Acute Rehab Services Pager: [phone]     Etta Grandchild 09/13/2017, 5:59 PM

## 2017-09-13 NOTE — Progress Notes (Signed)
[facility] Progress Note  2 Days Post-Op  Subjective: CC- NG tube Main complaint this morning is pain from NG tube. States that she has started passing a lot of gas. Denies n/v. No BM. Hiccups resolved. Ambulated a few times yesterday.   Objective: Vital signs in last 24 hours: Temp:  [98.4 F (36.9 C)-98.5 F (36.9 C)] 98.4 F (36.9 C) (05/15 0500) Pulse Rate:  [93-106] 93 (05/15 0500) Resp:  [17-18] 17 (05/14 2112) BP: (124-138)/(64-71) 138/71 (05/15 0500) SpO2:  [98 %-99 %] 98 % (05/15 0500) Last BM Date: 09/07/17  Intake/Output from previous day: 05/14 0701 - 05/15 0700 In: 1915.3 [P.O.:135; I.V.:1780.3] Out: 950 [Urine:800; Emesis/NG output:150] Intake/Output this shift: No intake/output data recorded.  PE: Gen:  Alert, NAD, pleasant HEENT: EOM's intact, pupils equal and round Card:  RRR, no M/G/R heard Pulm:  CTAB, no W/R/R, effort normal Abd: Soft, mild distension, + BS, midline incision cdi with staples intact and no erythema or active drainage Ext:  Calves soft and nontender Psych: A&Ox3  Skin: no rashes noted, warm and dry  Lab Results:  Recent Labs    09/11/17 2345 09/13/17 0509  WBC 13.2* 11.2*  HGB 13.3 12.2  HCT 39.2 37.5  PLT 335 316   BMET Recent Labs    09/12/17 0201 09/13/17 0509  NA 136 133*  K 3.2* 3.7  CL 96* 97*  CO2 28 24  GLUCOSE 136* 142*  BUN 28* 20  CREATININE 1.28* 0.85  CALCIUM 8.7* 8.6*   PT/INR No results for input(s): LABPROT, INR in the last 72 hours. CMP     Component Value Date/Time   NA 133 (L) 09/13/2017 0509   K 3.7 09/13/2017 0509   CL 97 (L) 09/13/2017 0509   CO2 24 09/13/2017 0509   GLUCOSE 142 (H) 09/13/2017 0509   BUN 20 09/13/2017 0509   CREATININE 0.85 09/13/2017 0509   CALCIUM 8.6 (L) 09/13/2017 0509   PROT 7.9 09/10/2017 1751   ALBUMIN 4.4 09/10/2017 1751   AST 14 (L) 09/10/2017 1751   ALT 12 (L) 09/10/2017 1751   ALKPHOS 78 09/10/2017 1751   BILITOT 1.1 09/10/2017 1751   GFRNONAA >60 09/13/2017 0509   GFRAA >60 09/13/2017 0509   Lipase     Component Value Date/Time   LIPASE 33 09/10/2017 1751       Studies/Results: No results found.  Anti-infectives: Anti-infectives (From admission, onward)   Start     Dose/Rate Route Frequency Ordered Stop   09/11/17 0045  cefoTEtan (CEFOTAN) 2 g in sodium chloride 0.9 % 100 mL IVPB     2 g 200 mL/hr over 30 Minutes Intravenous  Once 09/11/17 0039 09/11/17 0123       Assessment/Plan HTN HLD GERD H/o PE - plan to continue lovenox x30 days after discharge  Small intestine volvulus S/p exploratory laparotomy, lysis of adhesions, enterorrhaphy, incisional hernia repair5/13 Dr. Sheliah Hatch - POD 1 - low NG tube output, passing flatus - honeycomb dressing removed today and replaced with ABD pad due to significant dried bloody drainage  ID - cefotetan x1 periop FEN - IVF, sips of clears VTE - SCDs, lovenox. Plan to continue lovenox x30 at discharge Foley - out 5/14 Follow up - Dr. Sheliah Hatch  Plan - D/c NG tube. Allow sips of clear liquids from the floor. Will wait to add oral pain med regimen until diet advanced. Continue PT/ambulation. Labs in AM.   LOS: 2 days    Franne Forts , PA-C  [facility] 09/13/2017, 9:44 AM Pager: [phone] Consults: [phone] Mon-Fri 7:00 am-4:30 pm Sat-Sun 7:00 am-11:30 am

## 2017-09-13 NOTE — Care Management Note (Signed)
Case Management Note  Patient Details  Name: Rhonda [name] MRN: [id number] Date of Birth: 19-Aug-1950  Subjective/Objective:                    Action/Plan:  Spoke to patient at bedside. Confirmed face sheet information. Discussed home health. Patient has had home health in the past and unsure of the name of agency. Home health was set up by SNF . She would like same agency. Provided a list of agencies but none sounded familiar to patient. Patient was the name of agency at home and will ask her daughter to check.   NCM will follow up with patient for name of home health agency. Patient already has 3 in 1 and walker at home.  Expected Discharge Date:                  Expected Discharge Plan:  Home w Home Health Services  In-House Referral:     Discharge planning Services  CM Consult  Post Acute Care Choice:  Home Health Choice offered to:  Patient  DME Arranged:  N/A DME Agency:  NA  HH Arranged:  PT HH Agency:     Status of Service:  In process, will continue to follow  If discussed at Long Length of Stay Meetings, dates discussed:    Additional Comments:  Kingsley Plan, RN 09/13/2017, 11:42 AM

## 2017-09-14 LAB — GLUCOSE, CAPILLARY
GLUCOSE-CAPILLARY: 109 mg/dL — AB (ref 65–99)
GLUCOSE-CAPILLARY: 94 mg/dL (ref 65–99)
Glucose-Capillary: 111 mg/dL — ABNORMAL HIGH (ref 65–99)
Glucose-Capillary: 107 mg/dL — ABNORMAL HIGH (ref 65–99)

## 2017-09-14 LAB — CBC
HCT: 31.7 % — ABNORMAL LOW (ref 36.0–46.0)
HEMOGLOBIN: 10.3 g/dL — AB (ref 12.0–15.0)
MCH: 27.8 pg (ref 26.0–34.0)
MCHC: 32.5 g/dL (ref 30.0–36.0)
MCV: 85.4 fL (ref 78.0–100.0)
Platelets: 284 10*3/uL (ref 150–400)
RBC: 3.71 MIL/uL — AB (ref 3.87–5.11)
RDW: 14.3 % (ref 11.5–15.5)
WBC: 7.6 10*3/uL (ref 4.0–10.5)

## 2017-09-14 LAB — BASIC METABOLIC PANEL
Anion gap: 8 (ref 5–15)
BUN: 10 mg/dL (ref 6–20)
CHLORIDE: 99 mmol/L — AB (ref 101–111)
CO2: 29 mmol/L (ref 22–32)
Calcium: 8.5 mg/dL — ABNORMAL LOW (ref 8.9–10.3)
Creatinine, Ser: 0.59 mg/dL (ref 0.44–1.00)
GFR calc Af Amer: >60 mL/min (ref >60)
GFR calc non Af Amer: >60 mL/min (ref >60)
GLUCOSE: 118 mg/dL — AB (ref 65–99)
POTASSIUM: 3.5 mmol/L (ref 3.5–5.1)
Sodium: 136 mmol/L (ref 135–145)

## 2017-09-14 LAB — PHOSPHORUS: Phosphorus: 1.8 mg/dL — ABNORMAL LOW (ref 2.5–4.6)

## 2017-09-14 LAB — MAGNESIUM: Magnesium: 1.7 mg/dL (ref 1.7–2.4)

## 2017-09-14 MED ORDER — TRAMADOL HCL 50 MG PO TABS
50.0000 mg | ORAL_TABLET | Freq: Four times a day (QID) | ORAL | Status: DC | PRN
  Administered 2017-09-15: 50 mg via ORAL
  Filled 2017-09-14: qty 1

## 2017-09-14 MED ORDER — METHOCARBAMOL 500 MG PO TABS: 500.0000 mg | ORAL_TABLET | Freq: Three times a day (TID) | ORAL | Status: DC | PRN

## 2017-09-14 MED ORDER — MORPHINE SULFATE (PF) 4 MG/ML IV SOLN
2.0000 mg | INTRAVENOUS | Status: DC | PRN
  Administered 2017-09-14: 2 mg via INTRAVENOUS
  Filled 2017-09-14: qty 1

## 2017-09-14 MED ORDER — POTASSIUM PHOSPHATES 15 MMOLE/5ML IV SOLN
40.0000 meq | Freq: Once | INTRAVENOUS | Status: AC
  Administered 2017-09-14: 40 meq via INTRAVENOUS
  Filled 2017-09-14: qty 9.09

## 2017-09-14 MED ORDER — ACETAMINOPHEN 325 MG PO TABS
650.0000 mg | ORAL_TABLET | Freq: Four times a day (QID) | ORAL | Status: DC
  Administered 2017-09-14 – 2017-09-16 (×7): 650 mg via ORAL
  Filled 2017-09-14 (×9): qty 2

## 2017-09-14 MED ORDER — FAMOTIDINE 40 MG/5ML PO SUSR
20.0000 mg | Freq: Two times a day (BID) | ORAL | Status: DC
  Administered 2017-09-14 – 2017-09-16 (×4): 20 mg via ORAL
  Filled 2017-09-14 (×6): qty 2.5

## 2017-09-14 NOTE — Discharge Instructions (Addendum)
[facility]      [facility] °[phone] ° °OPEN ABDOMINAL SURGERY: POST OP INSTRUCTIONS ° °Always review your discharge instruction sheet given to you by the facility where your surgery was performed. ° °IF YOU HAVE DISABILITY OR FAMILY LEAVE FORMS, YOU MUST BRING THEM TO THE OFFICE FOR PROCESSING.  PLEASE DO NOT GIVE THEM TO YOUR DOCTOR. ° °1. A prescription for pain medication may be given to you upon discharge.  Take your pain medication as prescribed, if needed.  If narcotic pain medicine is not needed, then you may take acetaminophen (Tylenol) or ibuprofen (Advil) as needed. °2. Take your usually prescribed medications unless otherwise directed. °3. If you need a refill on your pain medication, please contact your pharmacy. They will contact our office to request authorization.  Prescriptions will not be filled after 5pm or on week-ends. °4. You should follow a light diet the first few days after arrival home, such as soup and crackers, pudding, etc.unless your doctor has advised otherwise. A high-fiber, low fat diet can be resumed as tolerated.   Be sure to include lots of fluids daily. Most patients will experience some swelling and bruising on the chest and neck area.  Ice packs will help.  Swelling and bruising can take several days to resolve °5. Most patients will experience some swelling and bruising in the area of the incision. Ice pack will help. Swelling and bruising can take several days to resolve..  °6. It is common to experience some constipation if taking pain medication after surgery.  Increasing fluid intake and taking a stool softener will usually help or prevent this problem from occurring.  A mild laxative (Milk of Magnesia or Miralax) should be taken according to package directions if there are no bowel movements after 48 hours. °7.  You may have steri-strips (small skin tapes) in place directly over the incision.  These strips should be left on the skin for 7-10 days.  If your  surgeon used skin glue on the incision, you may shower in 24 hours.  The glue will flake off over the next 2-3 weeks.  Any sutures or staples will be removed at the office during your follow-up visit. You may find that a light gauze bandage over your incision may keep your staples from being rubbed or pulled. You may shower and replace the bandage daily. °8. ACTIVITIES:  You may resume regular (light) daily activities beginning the next day--such as daily self-care, walking, climbing stairs--gradually increasing activities as tolerated.  You may have sexual intercourse when it is comfortable.  Refrain from any heavy lifting or straining until approved by your doctor. °a. You may drive when you no longer are taking prescription pain medication, you can comfortably wear a seatbelt, and you can safely maneuver your car and apply brakes °b. Return to Work: ___________________________________ °9. You should see your doctor in the office for a follow-up appointment approximately two weeks after your surgery.  Make sure that you call for this appointment within a day or two after you arrive home to insure a convenient appointment time. °OTHER INSTRUCTIONS:  °_____________________________________________________________ °_____________________________________________________________ ° °WHEN TO CALL YOUR DOCTOR: °1. Fever over 101.0 °2. Inability to urinate °3. Nausea and/or vomiting °4. Extreme swelling or bruising °5. Continued bleeding from incision. °6. Increased pain, redness, or drainage from the incision. °7. Difficulty swallowing or breathing °8. Muscle cramping or spasms. °9. Numbness or tingling in hands or feet or around lips. ° °The clinic staff is available to   answer your questions during regular business hours.  Please dont hesitate to call and ask to speak to one of the nurses if you have concerns.  For further questions, please visit [email or web address]    Enoxaparin injection What is this  medicine? ENOXAPARIN (ee nox a PA rin) is used after knee, hip, or abdominal surgeries to prevent blood clotting. It is also used to treat existing blood clots in the lungs or in the veins. This medicine may be used for other purposes; ask your health care provider or pharmacist if you have questions. COMMON BRAND NAME(S): Lovenox What should I tell my health care provider before I take this medicine? They need to know if you have any of these conditions: -bleeding disorders, hemorrhage, or hemophilia -infection of the heart or heart valves -kidney or liver disease -previous stroke -prosthetic heart valve -recent surgery or delivery of a baby -ulcer in the stomach or intestine, diverticulitis, or other bowel disease -an unusual or allergic reaction to enoxaparin, heparin, pork or pork products, other medicines, foods, dyes, or preservatives -pregnant or trying to get pregnant -breast-feeding How should I use this medicine? This medicine is for injection under the skin. It is usually given by a health-care professional. You or a family member may be trained on how to give the injections. If you are to give yourself injections, make sure you understand how to use the syringe, measure the dose if necessary, and give the injection. To avoid bruising, do not rub the site where this medicine has been injected. Do not take your medicine more often than directed. Do not stop taking except on the advice of your doctor or health care professional. Make sure you receive a puncture-resistant container to dispose of the needles and syringes once you have finished with them. Do not reuse these items. Return the container to your doctor or health care professional for proper disposal. Talk to your pediatrician regarding the use of this medicine in children. Special care may be needed. Overdosage: If you think you have taken too much of this medicine contact a poison control center or emergency room at  once. NOTE: This medicine is only for you. Do not share this medicine with others. What if I miss a dose? If you miss a dose, take it as soon as you can. If it is almost time for your next dose, take only that dose. Do not take double or extra doses. What may interact with this medicine? -aspirin and aspirin-like medicines -certain medicines that treat or prevent blood clots -dipyridamole -NSAIDs, medicines for pain and inflammation, like ibuprofen or naproxen This list may not describe all possible interactions. Give your health care provider a list of all the medicines, herbs, non-prescription drugs, or dietary supplements you use. Also tell them if you smoke, drink alcohol, or use illegal drugs. Some items may interact with your medicine. What should I watch for while using this medicine? Visit your doctor or health care professional for regular checks on your progress. Your condition will be monitored carefully while you are receiving this medicine. Notify your doctor or health care professional and seek emergency treatment if you develop breathing problems; changes in vision; chest pain; severe, sudden headache; pain, swelling, warmth in the leg; trouble speaking; sudden numbness or weakness of the face, arm, or leg. These can be signs that your condition has gotten worse. If you are going to have surgery, tell your doctor or health care professional that you are taking this medicine. Do not  stop taking this medicine without first talking to your doctor. Be sure to refill your prescription before you run out of medicine. Avoid sports and activities that might cause injury while you are using this medicine. Severe falls or injuries can cause unseen bleeding. Be careful when using sharp tools or knives. Consider using an Neurosurgeon. Take special care brushing or flossing your teeth. Report any injuries, bruising, or red spots on the skin to your doctor or health care professional. What side  effects may I notice from receiving this medicine? Side effects that you should report to your doctor or health care professional as soon as possible: -allergic reactions like skin rash, itching or hives, swelling of the face, lips, or tongue -feeling faint or lightheaded, falls -signs and symptoms of bleeding such as bloody or black, tarry stools; red or dark-brown urine; spitting up blood or brown material that looks like coffee grounds; red spots on the skin; unusual bruising or bleeding from the eye, gums, or nose Side effects that usually do not require medical attention (report to your doctor or health care professional if they continue or are bothersome): -pain, redness, or irritation at site where injected This list may not describe all possible side effects. Call your doctor for medical advice about side effects. You may report side effects to FDA at 1-800-FDA-1088. Where should I keep my medicine? Keep out of the reach of children. Store at room temperature between 15 and 30 degrees C (59 and 86 degrees F). Do not freeze. If your injections have been specially prepared, you may need to store them in the refrigerator. Ask your pharmacist. Throw away any unused medicine after the expiration date. NOTE: This sheet is a summary. It may not cover all possible information. If you have questions about this medicine, talk to your doctor, pharmacist, or health care provider.  2018 Elsevier/Gold Standard (2013-08-20 16:06:21)

## 2017-09-14 NOTE — Care Management Note (Addendum)
Case Management Note  Patient Details  Name: Rhonda [name] MRN: [id number] Date of Birth: May 31, 1950  Subjective/Objective:                    Action/Plan:1606 update Patient has not decided on [facility] agency yet, will follow up tomorrow    Patient has not decided on home health agency . Patient asked NCM to come back this afternoon. Will continue to follow.Expected Discharge Date:                  Expected Discharge Plan:  Home w Home Health Services  In-House Referral:     Discharge planning Services  CM Consult  Post Acute Care Choice:  Home Health Choice offered to:  Patient  DME Arranged:  N/A DME Agency:  NA  HH Arranged:  PT HH Agency:     Status of Service:  In process, will continue to follow  If discussed at Long Length of Stay Meetings, dates discussed:    Additional Comments:  Kingsley Plan, RN 09/14/2017, 10:39 AM

## 2017-09-14 NOTE — Progress Notes (Signed)
[facility] Progress Note  3 Days Post-Op  Subjective: CC- loose stools Patient states that she has had several loose BMs and is passing flatus. Denies n/v. Tolerating clear liquids. Pain well controlled. Ambulated in the halls a few times yesterday.  Takes care of her 68yo mother, but will have multiple family members in/out at home to help her once discharged.  Objective: Vital signs in last 24 hours: Temp:  [98.1 F (36.7 C)-98.2 F (36.8 C)] 98.1 F (36.7 C) (05/16 0523) Pulse Rate:  [81-85] 81 (05/16 0523) BP: (140-156)/(65-80) 140/65 (05/16 0523) SpO2:  [99 %] 99 % (05/16 0523) Last BM Date: 09/13/17  Intake/Output from previous day: 05/15 0701 - 05/16 0700 In: 1000 [I.V.:900; IV Piggyback:100] Out: -  Intake/Output this shift: No intake/output data recorded.  PE: Gen: Alert, NAD, pleasant HEENT: EOM's intact, pupils equal and round Card: RRR, no M/G/R heard Pulm: CTAB, no W/R/R, effort normal Abd: Soft,mild distension,+BS, nontender, midline incision cdi with staples intact and no erythema or active drainage AVW:UJWJXB soft and nontender Psych: A&Ox3  Skin: no rashes noted, warm and dry  Lab Results:  Recent Labs    09/13/17 0509 09/14/17 0502  WBC 11.2* 7.6  HGB 12.2 10.3*  HCT 37.5 31.7*  PLT 316 284   BMET Recent Labs    09/13/17 0509 09/14/17 0502  NA 133* 136  K 3.7 3.5  CL 97* 99*  CO2 24 29  GLUCOSE 142* 118*  BUN 20 10  CREATININE 0.85 0.59  CALCIUM 8.6* 8.5*   PT/INR No results for input(s): LABPROT, INR in the last 72 hours. CMP     Component Value Date/Time   NA 136 09/14/2017 0502   K 3.5 09/14/2017 0502   CL 99 (L) 09/14/2017 0502   CO2 29 09/14/2017 0502   GLUCOSE 118 (H) 09/14/2017 0502   BUN 10 09/14/2017 0502   CREATININE 0.59 09/14/2017 0502   CALCIUM 8.5 (L) 09/14/2017 0502   PROT 7.9 09/10/2017 1751   ALBUMIN 4.4 09/10/2017 1751   AST 14 (L) 09/10/2017 1751   ALT 12 (L) 09/10/2017 1751   ALKPHOS 78 09/10/2017 1751   BILITOT 1.1 09/10/2017 1751   GFRNONAA >60 09/14/2017 0502   GFRAA >60 09/14/2017 0502   Lipase     Component Value Date/Time   LIPASE 33 09/10/2017 1751       Studies/Results: No results found.  Anti-infectives: Anti-infectives (From admission, onward)   Start     Dose/Rate Route Frequency Ordered Stop   09/11/17 0045  cefoTEtan (CEFOTAN) 2 g in sodium chloride 0.9 % 100 mL IVPB     2 g 200 mL/hr over 30 Minutes Intravenous  Once 09/11/17 0039 09/11/17 0123       Assessment/Plan HTN HLD GERD H/o PE - plan to continue lovenox x30 days after discharge  Small intestine volvulus S/pexploratory laparotomy, lysis of adhesions, enterorrhaphy, incisional hernia repair5/13 Dr. Sheliah Hatch -POD 3 - having bowel function and tolerating clears  ID -cefotetan x1 periop FEN - decreaseIVF, FLD VTE -SCDs, lovenox. Plan to continue lovenox x30 at discharge Foley -out 5/14 Follow up -Dr. Sheliah Hatch  Plan- Advance to full liquids, patient does not like Boost/Ensure so her daughter is going to bring her some Pedialite. Add oral pain meds. Replace phosphorous. Labs in AM. Hopefully home 1-2 days.   LOS: 3 days    Franne Forts , [facility] 09/14/2017, 8:10 AM Pager: [phone] Consults: [phone] Mon-Fri 7:00 am-4:30 pm Sat-Sun 7:00 am-11:30  am

## 2017-09-15 LAB — BASIC METABOLIC PANEL
Anion gap: 8 (ref 5–15)
BUN: 5 mg/dL — AB (ref 6–20)
CALCIUM: 8.5 mg/dL — AB (ref 8.9–10.3)
CHLORIDE: 102 mmol/L (ref 101–111)
CO2: 28 mmol/L (ref 22–32)
CREATININE: 0.63 mg/dL (ref 0.44–1.00)
GFR calc non Af Amer: >60 mL/min (ref >60)
Glucose, Bld: 110 mg/dL — ABNORMAL HIGH (ref 65–99)
Potassium: 3.5 mmol/L (ref 3.5–5.1)
Sodium: 138 mmol/L (ref 135–145)

## 2017-09-15 LAB — GLUCOSE, CAPILLARY
GLUCOSE-CAPILLARY: 86 mg/dL (ref 65–99)
Glucose-Capillary: 119 mg/dL — ABNORMAL HIGH (ref 65–99)
Glucose-Capillary: 89 mg/dL (ref 65–99)

## 2017-09-15 LAB — MAGNESIUM: Magnesium: 1.6 mg/dL — ABNORMAL LOW (ref 1.7–2.4)

## 2017-09-15 LAB — PHOSPHORUS: PHOSPHORUS: 3.2 mg/dL (ref 2.5–4.6)

## 2017-09-15 MED ORDER — MAGNESIUM OXIDE 400 (241.3 MG) MG PO TABS
400.0000 mg | ORAL_TABLET | Freq: Two times a day (BID) | ORAL | Status: AC
  Administered 2017-09-15 (×2): 400 mg via ORAL
  Filled 2017-09-15 (×2): qty 1

## 2017-09-15 NOTE — Progress Notes (Signed)
  [facility] Progress Note  4 Days Post-Op  Subjective: CC- reflux Patient states that she vomited twice yesterday but is feeling much better yesterday. She was given pepcid and this helped a lot. Denies any current abdominal bloating, nausea, or vomiting. Passing flatus. She had multiple BMs yesterday. Tolerating full liquids. Ready to get OOB and walk.  Objective: Vital signs in last 24 hours: Temp:  [97.9 F (36.6 C)-98.5 F (36.9 C)] 98 F (36.7 C) (05/17 0543) Pulse Rate:  [69-81] 69 (05/17 0543) Resp:  [18-20] 18 (05/17 0543) BP: (117-136)/(55-60) 117/57 (05/17 0543) SpO2:  [97 %-100 %] 100 % (05/17 0543) Last BM Date: 09/13/17  Intake/Output from previous day: 05/16 0701 - 05/17 0700 In: 180 [P.O.:180] Out: 200 [Emesis/NG output:200] Intake/Output this shift: No intake/output data recorded.  PE: Gen: Alert, NAD, pleasant HEENT: EOM's intact, pupils equal and round Card: RRR, no M/G/R heard Pulm: CTAB, no W/R/R, effort normal Abd: Soft,mild distension,+BS, nontender, midline incision cdiwith staples intact and no erythema/trace serosanguinous drainage from distal 1/3 of incision no purulence WJX:BJYNWG soft and nontender Psych: A&Ox3  Skin: no rashes noted, warm and dry   Lab Results:  Recent Labs    09/13/17 0509 09/14/17 0502  WBC 11.2* 7.6  HGB 12.2 10.3*  HCT 37.5 31.7*  PLT 316 284   BMET Recent Labs    09/14/17 0502 09/15/17 0504  NA 136 138  K 3.5 3.5  CL 99* 102  CO2 29 28  GLUCOSE 118* 110*  BUN 10 5*  CREATININE 0.59 0.63  CALCIUM 8.5* 8.5*   PT/INR No results for input(s): LABPROT, INR in the last 72 hours. CMP     Component Value Date/Time   NA 138 09/15/2017 0504   K 3.5 09/15/2017 0504   CL 102 09/15/2017 0504   CO2 28 09/15/2017 0504   GLUCOSE 110 (H) 09/15/2017 0504   BUN 5 (L) 09/15/2017 0504   CREATININE 0.63 09/15/2017 0504   CALCIUM 8.5 (L) 09/15/2017 0504   PROT 7.9 09/10/2017 1751   ALBUMIN  4.4 09/10/2017 1751   AST 14 (L) 09/10/2017 1751   ALT 12 (L) 09/10/2017 1751   ALKPHOS 78 09/10/2017 1751   BILITOT 1.1 09/10/2017 1751   GFRNONAA >60 09/15/2017 0504   GFRAA >60 09/15/2017 0504   Lipase     Component Value Date/Time   LIPASE 33 09/10/2017 1751       Studies/Results: No results found.  Anti-infectives: Anti-infectives (From admission, onward)   Start     Dose/Rate Route Frequency Ordered Stop   09/11/17 0045  cefoTEtan (CEFOTAN) 2 g in sodium chloride 0.9 % 100 mL IVPB     2 g 200 mL/hr over 30 Minutes Intravenous  Once 09/11/17 0039 09/11/17 0123       Assessment/Plan HTN HLD GERD H/o PE - plan to continue lovenox x30 days after discharge  Small intestine volvulus S/pexploratory laparotomy, lysis of adhesions, enterorrhaphy, incisional hernia repair5/13 Dr. Sheliah Hatch -POD 4 - having bowel function and tolerating fulls  ID -cefotetan x1 periop FEN - decreaseIVF, soft diet VTE -SCDs, lovenox. Plan to continue lovenox x30 at discharge Foley -out 5/14 Follow up -Dr. Sheliah Hatch  Plan-Advance to soft diet. Replace magnesium. Continue pepcid. Continue ambulating. Likely ready for discharge tomorrow.   LOS: 4 days    Franne Forts , [facility] 09/15/2017, 8:23 AM Pager: [phone] Consults: [phone] Mon-Fri 7:00 am-4:30 pm Sat-Sun 7:00 am-11:30 am

## 2017-09-15 NOTE — Care Management Note (Signed)
Case Management Note  Patient Details  Name: Rhonda [name] MRN: [id number] Date of Birth: 07-05-1950  Subjective/Objective:                    Action/Plan:   Expected Discharge Date:                  Expected Discharge Plan:  Home w Home Health Services  In-House Referral:     Discharge planning Services  CM Consult  Post Acute Care Choice:  Home Health Choice offered to:  Patient  DME Arranged:  N/A DME Agency:  NA  HH Arranged:  PT HH Agency:  [facility]  Status of Service:  Completed, signed off  If discussed at Long Length of Stay Meetings, dates discussed:    Additional Comments:  Kingsley Plan, RN 09/15/2017, 10:33 AM

## 2017-09-15 NOTE — Progress Notes (Signed)
Physical Therapy Treatment Patient Details Name: Rhonda [name] MRN: [id number] DOB: [birth year] Today's Date: 09/15/2017    History of Present Illness 67 y.o. female with PMHx: HTN; HLD; h/o anemia; and complicated admission in 2/18 for open ventral hernia repair with resultant PE. Admitted with small intestinal volvulus, now s/p ex lap, lysis of adhesions, enterorrhaphy, and incisional hernia repair 5/13    PT Comments    Patient progressing well with therapy, supervision for transfers and able to ambulate with min guard without RW. At this time safer with RW, supervision level for walking with AD. Session focused on stair training and family education for safe guarding on stairs with home entry. Pt supervision level on steps, no concerns from pt or family. A little nauseated today but did not limit her activity as she has walked with mobility tech 2x prior to PT visit.     Follow Up Recommendations  Home health PT     Equipment Recommendations  None recommended by PT    Recommendations for Other Services       Precautions / Restrictions Restrictions Weight Bearing Restrictions: No    Mobility  Bed Mobility Overal bed mobility: Modified Independent                Transfers Overall transfer level: Needs assistance Equipment used: None;1 person hand held assist;Rolling walker (2 wheeled) Transfers: Sit to/from Stand Sit to Stand: Supervision         General transfer comment: Supervision at this visit without AD  Ambulation/Gait Ambulation/Gait assistance: Min guard;Supervision Ambulation Distance (Feet): 50 Feet Assistive device: Rolling walker (2 wheeled);None Gait Pattern/deviations: Step-through pattern;Decreased stride length;Trunk flexed     General Gait Details: min guard without AD, supervision with RW.    Stairs Stairs: Yes Stairs assistance: Min guard;Supervision Stair Management: One rail Left;Forwards;Sideways;Step to pattern Number of  Stairs: 12 General stair comments: supervision cues for foot placement, utilziing BUE support on rail sideways stepping. educated daughter on guarding positions.   Wheelchair Mobility    Modified Rankin (Stroke Patients Only)       Balance Overall balance assessment: Mild deficits observed, not formally tested                                          Cognition Arousal/Alertness: Awake/alert Behavior During Therapy: WFL for tasks assessed/performed Overall Cognitive Status: Within Functional Limits for tasks assessed                                        Exercises      General Comments        Pertinent Vitals/Pain Pain Assessment: Faces Faces Pain Scale: Hurts a little bit Pain Location: abdominal  Pain Descriptors / Indicators: Operative site guarding;Sore Pain Intervention(s): Limited activity within patient's tolerance;Monitored during session;Premedicated before session;Repositioned    Home Living                      Prior Function            PT Goals (current goals can now be found in the care plan section) Acute Rehab PT Goals Patient Stated Goal: return home PT Goal Formulation: With patient Time For Goal Achievement: 09/25/17 Potential to Achieve Goals: Good Progress towards PT goals: Progressing toward goals  Frequency    Min 3X/week      PT Plan Current plan remains appropriate    Co-evaluation              AM-PAC PT "6 Clicks" Daily Activity  Outcome Measure  Difficulty turning over in bed (including adjusting bedclothes, sheets and blankets)?: Unable Difficulty moving from lying on back to sitting on the side of the bed? : Unable Difficulty sitting down on and standing up from a chair with arms (e.g., wheelchair, bedside commode, etc,.)?: Unable Help needed moving to and from a bed to chair (including a wheelchair)?: A Little Help needed walking in hospital room?: A Little Help needed  climbing 3-5 steps with a railing? : A Little 6 Click Score: 12    End of Session Equipment Utilized During Treatment: Gait belt Activity Tolerance: Patient tolerated treatment well Patient left: in chair;with call bell/phone within reach Nurse Communication: Mobility status PT Visit Diagnosis: Other abnormalities of gait and mobility (R26.89);Muscle weakness (generalized) (M62.81)     Time: 0981-1914 PT Time Calculation (min) (ACUTE ONLY): 14 min  Charges:  $Gait Training: 8-22 mins                    G Codes:      Etta Grandchild, PT, DPT Acute Rehab Services Pager: [phone]     Etta Grandchild 09/15/2017, 6:02 PM

## 2017-09-15 NOTE — Care Management Important Message (Addendum)
Important Message  Patient Details  Name: Rhonda [name] MRN: [id number] Date of Birth: [birth year]   Medicare Important Message Given:  Yes  Due to illness patient did not sign/Pt requested unsigned copy   Rui Wordell Stefan Church 09/15/2017, 1:36 PM

## 2017-09-16 LAB — GLUCOSE, CAPILLARY
Glucose-Capillary: 88 mg/dL (ref 65–99)
Glucose-Capillary: 85 mg/dL (ref 65–99)

## 2017-09-16 MED ORDER — ENOXAPARIN SODIUM 40 MG/0.4ML ~~LOC~~ SOLN: 40.0000 mg | SUBCUTANEOUS | 0 refills | Status: DC

## 2017-09-16 MED ORDER — TRAMADOL HCL 50 MG PO TABS: 50.0000 mg | ORAL_TABLET | Freq: Four times a day (QID) | ORAL | 0 refills | Status: DC | PRN

## 2017-09-16 NOTE — Discharge Summary (Addendum)
[facility] Discharge Summary   Patient ID: Rhonda [name] MRN: [id number] DOB/AGE: Sep 08, 1950 67 y.o.  Admit date: 09/10/2017 Discharge date: 09/16/2017  Admitting Diagnosis: Small intestine volvulus  Discharge Diagnosis Patient Active Problem List   Diagnosis Date Noted  . Volvulus of small intestine (HCC) 09/11/2017  . History of pulmonary embolus (PE) 09/11/2017  . Hypokalemia 09/11/2017  . Hyperglycemia 09/11/2017  . Pulmonary embolism (HCC) 06/21/2016  . S/P hernia repair 06/15/2016  . Acute sinusitis 06/12/2015  . Cough 06/12/2015  . History of asthma 06/12/2015    Consultants Internal medicine  Imaging: No results found.  Procedures Dr. Sheliah Hatch (09/11/17) - exploratory laparotomy, lysis of adhesions, enterorrhaphy, incisional hernia repair   Hospital Course:  Rhonda [name] is a 67yo female with prior history of open ventral hernia repair 06/15/16 by Dr. Derrell Lolling who presented to [facility] 5/12 with 5 days of abdominal pain, nausea, and vomiting.  Workup showed SBO with concern for volvulus.  Patient was admitted and underwent procedure listed above.  Tolerated procedure well and was transferred to the floor.  She did have an ileus postoperatively as expected, but this improved with time. Diet was advanced as tolerated.  Patient worked with therapies during this admission. On POD5 the patient was voiding well, tolerating diet, ambulating well, pain well controlled, vital signs stable, incisions c/d/i and felt stable for discharge home.  She will go home with 30 days of lovenox for DVT prophylaxis as she has had a prior PE. Patient will follow up as below and knows to call with questions or concerns.     Physical Exam: Gen: Alert, NAD, pleasant HEENT: EOM's intact, pupils equal and round Card: RRR, no M/G/R heard Pulm: CTAB, no W/R/R, effort normal Abd: Soft, nondistended,+BS,nontender,midline incision cdiwith staples intact and no erythema or  drainage WUJ:WJXBJY soft and nontender Psych: A&Ox3  Skin: no rashes noted, warm and dry   Allergies as of 09/16/2017      Reactions   Aspirin-acetaminophen-caffeine Nausea And Vomiting   Codeine Diarrhea, Nausea And Vomiting   Stomach cramps   Oxycontin [oxycodone Hcl] Other (See Comments)   Hallucination      Medication List    TAKE these medications   albuterol 108 (90 Base) MCG/ACT inhaler Commonly known as:  PROVENTIL HFA;VENTOLIN HFA Inhale 2 puffs into the lungs every 6 (six) hours as needed for wheezing or shortness of breath.   dorzolamide-timolol 22.3-6.8 MG/ML ophthalmic solution Commonly known as:  COSOPT Instill 1 drop into both eyes two times a day   enoxaparin 40 MG/0.4ML injection Commonly known as:  LOVENOX Inject 0.4 mLs (40 mg total) into the skin daily.   fluticasone 50 MCG/ACT nasal spray Commonly known as:  FLONASE Place 2 sprays into both nostrils daily as needed for allergies or rhinitis.   levocetirizine 5 MG tablet Commonly known as:  XYZAL TAKE 1 TABLET BY MOUTH ONCE DAILY IN THE EVENING   losartan-hydrochlorothiazide 100-25 MG tablet Commonly known as:  HYZAAR Take 1 tablet by mouth daily.   NEXIUM 24HR PO Take daily   ranitidine 300 MG tablet Commonly known as:  ZANTAC Take 300 mg by mouth daily as needed for heartburn.   traMADol 50 MG tablet Commonly known as:  ULTRAM Take 1 tablet (50 mg total) by mouth every 6 (six) hours as needed for severe pain.   VITAMIN D-3 PO Take 1 capsule by mouth daily.   ZETIA 10 MG tablet Generic drug:  ezetimibe Take 10 mg by mouth  once a day        Follow-up Information    Kinsinger, De Blanch, MD. Go on 09/22/2017.   Specialty:  General Surgery Why:  Your appointment is 09/22/17 at 11:30AM. Please arrive 15 minutes prior to your appointment to check in and fill out paperwork. Bring photo ID and insurance information. Contact information: [address] [place]  [address] [phone]        [facility] - Dme Follow up.   Why:  Home Health PT  Contact information: [address] [place] [address] [phone]        Dorothyann Peng, MD. Call.   Specialty:  Internal Medicine Why:  call to arrange post-hospitalization follow up  Contact information: [address] [place] [address] [place] [address] [phone]           Signed: Franne Forts, [facility] 09/16/2017, 9:08 AM Pager: [phone] Consults: [phone] Mon-Fri 7:00 am-4:30 pm Sat-Sun 7:00 am-11:30 am

## 2017-09-27 DIAGNOSIS — Z09 Encounter for follow-up examination after completed treatment for conditions other than malignant neoplasm: Secondary | ICD-10-CM | POA: Diagnosis not present

## 2017-09-27 DIAGNOSIS — K562 Volvulus: Secondary | ICD-10-CM | POA: Diagnosis not present

## 2017-10-19 DIAGNOSIS — Z23 Encounter for immunization: Secondary | ICD-10-CM | POA: Diagnosis not present

## 2017-10-19 DIAGNOSIS — Z1389 Encounter for screening for other disorder: Secondary | ICD-10-CM | POA: Diagnosis not present

## 2017-10-19 DIAGNOSIS — E783 Hyperchylomicronemia: Secondary | ICD-10-CM | POA: Diagnosis not present

## 2017-10-19 DIAGNOSIS — I10 Essential (primary) hypertension: Secondary | ICD-10-CM | POA: Diagnosis not present

## 2017-10-19 DIAGNOSIS — R609 Edema, unspecified: Secondary | ICD-10-CM | POA: Diagnosis not present

## 2017-11-14 ENCOUNTER — Other Ambulatory Visit: Payer: Self-pay | Admitting: Internal Medicine

## 2017-11-14 DIAGNOSIS — E2839 Other primary ovarian failure: Secondary | ICD-10-CM

## 2017-12-27 ENCOUNTER — Other Ambulatory Visit: Payer: Medicare Other

## 2018-01-23 DIAGNOSIS — R7309 Other abnormal glucose: Secondary | ICD-10-CM | POA: Diagnosis not present

## 2018-01-23 DIAGNOSIS — I10 Essential (primary) hypertension: Secondary | ICD-10-CM | POA: Diagnosis not present

## 2018-01-23 DIAGNOSIS — J309 Allergic rhinitis, unspecified: Secondary | ICD-10-CM | POA: Diagnosis not present

## 2018-01-23 DIAGNOSIS — E783 Hyperchylomicronemia: Secondary | ICD-10-CM | POA: Diagnosis not present

## 2018-02-21 ENCOUNTER — Ambulatory Visit: Payer: Medicare Other | Admitting: Internal Medicine

## 2018-02-21 ENCOUNTER — Encounter: Payer: Self-pay | Admitting: Internal Medicine

## 2018-02-21 VITALS — BP 140/70 | HR 71 | Temp 98.0°F | Ht 65.0 in | Wt 243.0 lb

## 2018-02-21 DIAGNOSIS — R05 Cough: Secondary | ICD-10-CM

## 2018-02-21 DIAGNOSIS — R062 Wheezing: Secondary | ICD-10-CM | POA: Diagnosis not present

## 2018-02-21 DIAGNOSIS — E785 Hyperlipidemia, unspecified: Secondary | ICD-10-CM

## 2018-02-21 DIAGNOSIS — J40 Bronchitis, not specified as acute or chronic: Secondary | ICD-10-CM | POA: Diagnosis not present

## 2018-02-21 DIAGNOSIS — Z8639 Personal history of other endocrine, nutritional and metabolic disease: Secondary | ICD-10-CM

## 2018-02-21 DIAGNOSIS — E876 Hypokalemia: Secondary | ICD-10-CM | POA: Diagnosis not present

## 2018-02-21 MED ORDER — IPRATROPIUM-ALBUTEROL 0.5-2.5 (3) MG/3ML IN SOLN
3.0000 mL | Freq: Once | RESPIRATORY_TRACT | Status: AC
Start: 2018-02-21 — End: 2018-02-21
  Administered 2018-02-21: 3 mL via RESPIRATORY_TRACT

## 2018-02-21 MED ORDER — POTASSIUM CHLORIDE ER 10 MEQ PO TBCR: 10.0000 meq | EXTENDED_RELEASE_TABLET | Freq: Every day | ORAL | 0 refills | Status: DC

## 2018-02-21 MED ORDER — ALBUTEROL SULFATE HFA 108 (90 BASE) MCG/ACT IN AERS: 2.0000 | INHALATION_SPRAY | Freq: Four times a day (QID) | RESPIRATORY_TRACT | 0 refills | Status: DC | PRN

## 2018-02-21 MED ORDER — AZITHROMYCIN 250 MG PO TABS: ORAL_TABLET | ORAL | 0 refills | Status: AC

## 2018-02-21 NOTE — Patient Instructions (Signed)
  Come back or go to urgent care if you get worse in 48-72 hours.      Acute Bronchitis, Adult Acute bronchitis is when air tubes (bronchi) in the lungs suddenly get swollen. The condition can make it hard to breathe. It can also cause these symptoms:  A cough.  Coughing up clear, yellow, or green mucus.  Wheezing.  Chest congestion.  Shortness of breath.  A fever.  Body aches.  Chills.  A sore throat.  Follow these instructions at home: Medicines  Take over-the-counter and prescription medicines only as told by your doctor.  If you were prescribed an antibiotic medicine, take it as told by your doctor. Do not stop taking the antibiotic even if you start to feel better. General instructions  Rest.  Drink enough fluids to keep your pee (urine) clear or pale yellow.  Avoid smoking and secondhand smoke. If you smoke and you need help quitting, ask your doctor. Quitting will help your lungs heal faster.  Use an inhaler, cool mist vaporizer, or humidifier as told by your doctor.  Keep all follow-up visits as told by your doctor. This is important. How is this prevented? To lower your risk of getting this condition again:  Wash your hands often with soap and water. If you cannot use soap and water, use hand sanitizer.  Avoid contact with people who have cold symptoms.  Try not to touch your hands to your mouth, nose, or eyes.  Make sure to get the flu shot every year.  Contact a doctor if:  Your symptoms do not get better in 2 weeks. Get help right away if:  You cough up blood.  You have chest pain.  You have very bad shortness of breath.  You become dehydrated.  You faint (pass out) or keep feeling like you are going to pass out.  You keep throwing up (vomiting).  You have a very bad headache.  Your fever or chills gets worse. This information is not intended to replace advice given to you by your health care provider. Make sure you discuss any  questions you have with your health care provider. Document Released: 10/05/2007 Document Revised: 11/25/2015 Document Reviewed: 10/07/2015 Elsevier Interactive Patient Education  [employer].

## 2018-02-21 NOTE — Progress Notes (Signed)
Subjective:     Patient ID: Rhonda [name] , female    DOB: 03-30-1951 , 67 y.o.   MRN: [id number]   HPI  Her cold has not resolved and now her cough is worse and has been wheezing. Cough is productive with clear mucous. Has myalgias and fatigued. Has been taking Mucinex and robitusin. CP to cough. Has been using an old inhaler, and had been using the netie pot which helped, but stopped it when she developed  Nose irritation,  Today developed a HA and her eyes are watering. 2- She also wants to know if she should continue her potasium or not. Has not heard from her last CMP in September which I checked and it was in the low normal.  3- Has been taking 2 coQ10's per day for the past couple of weeks and will start the Prevastating this week.   .  Past Medical History:  Diagnosis Date  . Anemia    hx  . Arthritis    "maybe in my legs" (06/15/2016)  . Complication of anesthesia    woke up during colonoscopy 03-31-16 Dr. Laure Kidney  . GERD (gastroesophageal reflux disease)   . Hyperlipemia   . Hypertension   . Sinus congestion    "chronic" (06/15/2016)  . Sinus headache    "a few/month" (06/15/2016)  . Thyroid nodule 2010   "no problems w/them since; on a pill for a while" (06/15/2016)      Current Outpatient Medications:  .  albuterol (PROVENTIL HFA;VENTOLIN HFA) 108 (90 Base) MCG/ACT inhaler, Inhale 2 puffs into the lungs every 6 (six) hours as needed for wheezing or shortness of breath., Disp: , Rfl:  .  atorvastatin (LIPITOR) 10 MG tablet, Take 10 mg by mouth daily., Disp: , Rfl: 0 .  Cholecalciferol (VITAMIN D-3 PO), Take 1 capsule by mouth daily., Disp: , Rfl:  .  dorzolamide-timolol (COSOPT) 22.3-6.8 MG/ML ophthalmic solution, Instill 1 drop into both eyes two times a day, Disp: , Rfl:  .  Esomeprazole Magnesium (NEXIUM 24HR PO), Take daily, Disp: , Rfl:  .  fluticasone (FLONASE) 50 MCG/ACT nasal spray, Place 2 sprays into both nostrils daily as needed for allergies or rhinitis. ,  Disp: , Rfl:  .  hydrochlorothiazide (HYDRODIURIL) 25 MG tablet, Take 25 mg by mouth daily., Disp: , Rfl: 0 .  levocetirizine (XYZAL) 5 MG tablet, TAKE 1 TABLET BY MOUTH ONCE DAILY IN THE EVENING, Disp: 30 tablet, Rfl: 0 .  losartan (COZAAR) 100 MG tablet, Take 100 mg by mouth daily., Disp: , Rfl: 0 .  ranitidine (ZANTAC) 300 MG tablet, Take 300 mg by mouth daily as needed for heartburn., Disp: , Rfl:  .  traMADol (ULTRAM) 50 MG tablet, Take 1 tablet (50 mg total) by mouth every 6 (six) hours as needed for severe pain., Disp: 20 tablet, Rfl: 0   Allergies  Allergen Reactions  . Aspirin-Acetaminophen-Caffeine Nausea And Vomiting  . Codeine Diarrhea and Nausea And Vomiting    Stomach cramps   . Oxycontin [Oxycodone Hcl] Other (See Comments)    Hallucination      Review of Systems  Constitutional: Positive for chills and fatigue. Negative for diaphoresis and fever.  HENT: Positive for congestion, postnasal drip and rhinorrhea. Negative for ear discharge, ear pain, facial swelling, mouth sores, sneezing and sore throat.        Ears are popping   Eyes: Positive for discharge. Negative for pain, redness and itching.       Eyes are  watering  Respiratory: Positive for cough and wheezing. Negative for chest tightness and shortness of breath.   Cardiovascular: Negative for chest pain and palpitations.  Gastrointestinal: Negative for nausea.  Musculoskeletal: Positive for arthralgias. Negative for myalgias.  Skin: Negative for rash.  Allergic/Immunologic: Negative for environmental allergies.  Neurological: Positive for dizziness. Negative for weakness.       Mild  Hematological: Negative for adenopathy.     Today's Vitals   02/21/18 1213  BP: 140/70  Pulse: 71  Temp: 98 F (36.7 C)  TempSrc: Oral  SpO2: 98%  Weight: 243 lb (110.2 kg)  Height: 5\' 5"  (1.651 m)   Body mass index is 40.44 kg/m.  RAPID FLU- NEG Objective:  Physical Exam  Constitutional: She is oriented to person,  place, and time. She appears well-developed. No distress.  LOOKS LIKE SHE DOES NOT FEEL WELL  HENT:  Head: Normocephalic.  Right Ear: External ear normal.  Left Ear: External ear normal.  Nose: Nose normal.  Mouth/Throat: Oropharynx is clear and moist.  Has clear rhinitis, mucosa is slightly swollen.  TM's are dull and gray bilaterally  Eyes: Pupils are equal, round, and reactive to light. Conjunctivae are normal. Right eye exhibits discharge. Left eye exhibits discharge. No scleral icterus.  Eyes are watering  Neck: Neck supple. No thyromegaly present.  Cardiovascular: Normal rate and regular rhythm.  Pulmonary/Chest: Effort normal. No stridor. No respiratory distress. She has wheezes. She has no rales. She exhibits no tenderness.  Faint wheezing on bases initially but after coughing resolved  Lymphadenopathy:    She has no cervical adenopathy.  Neurological: She is alert and oriented to person, place, and time.  Skin: Skin is warm and dry. No rash noted. She is not diaphoretic.  Psychiatric: She has a normal mood and affect. Her behavior is normal. Judgment and thought content normal.  Nursing note and vitals reviewed.     Assessment And Plan:     1. Bronchitis- she was given a duo neb treatment. Placed on [employer] and I rent a refill of her Albuterol inhaler. FU if she gets worse.  2- Hx of hypokalemia- I sent a refill. May continue same dose for now.  3- Hyperlipidemia- will start her Pravastatin one qod x 2 weeks, then one qd  FU in 3 months with her PCP       Season Astacio RODRIGUEZ-SOUTHWORTH, PA-C ,a

## 2018-02-22 ENCOUNTER — Other Ambulatory Visit: Payer: Self-pay | Admitting: Internal Medicine

## 2018-02-22 MED ORDER — ALBUTEROL SULFATE HFA 108 (90 BASE) MCG/ACT IN AERS: 2.0000 | INHALATION_SPRAY | Freq: Four times a day (QID) | RESPIRATORY_TRACT | 5 refills | Status: DC | PRN

## 2018-03-07 ENCOUNTER — Encounter: Payer: Self-pay | Admitting: Internal Medicine

## 2018-03-22 ENCOUNTER — Other Ambulatory Visit: Payer: Self-pay | Admitting: Nurse Practitioner

## 2018-04-06 ENCOUNTER — Encounter: Payer: Self-pay | Admitting: Internal Medicine

## 2018-04-10 ENCOUNTER — Other Ambulatory Visit: Payer: Self-pay | Admitting: Nurse Practitioner

## 2018-04-10 ENCOUNTER — Other Ambulatory Visit: Payer: Self-pay | Admitting: Internal Medicine

## 2018-04-10 ENCOUNTER — Telehealth: Payer: Self-pay

## 2018-04-10 NOTE — Telephone Encounter (Signed)
I called pt and she states she has unilateral foot pain and shoulder pain. I explained is not due to cholesterol, but needs to be examined. She made an appointment

## 2018-04-10 NOTE — Telephone Encounter (Signed)
Patient wants a call from you she states her foot has been hurting so bad and she would like to know if it could be from the new chol. Medication she was put on ? Or if she needs to come in for a visit PA

## 2018-04-12 ENCOUNTER — Ambulatory Visit: Payer: Self-pay | Admitting: Nurse Practitioner

## 2018-04-12 ENCOUNTER — Ambulatory Visit: Payer: Medicare Other | Admitting: Internal Medicine

## 2018-04-12 ENCOUNTER — Encounter: Payer: Self-pay | Admitting: Internal Medicine

## 2018-04-12 VITALS — BP 120/70 | HR 81 | Temp 97.6°F | Ht 65.0 in | Wt 248.2 lb

## 2018-04-12 DIAGNOSIS — Z1239 Encounter for other screening for malignant neoplasm of breast: Secondary | ICD-10-CM | POA: Diagnosis not present

## 2018-04-12 DIAGNOSIS — M25511 Pain in right shoulder: Secondary | ICD-10-CM | POA: Diagnosis not present

## 2018-04-12 DIAGNOSIS — M722 Plantar fascial fibromatosis: Secondary | ICD-10-CM

## 2018-04-12 NOTE — Patient Instructions (Signed)

## 2018-04-12 NOTE — Progress Notes (Signed)
Subjective:     Patient ID: Rhonda [name] , female    DOB: April 15, 1951 , 67 y.o.   MRN: [id number]   Chief Complaint  Patient presents with  . Foot Pain    patient has been having R foot pain     HPI  Pt presents with R foot sole pain x 1 week ago. Mid arch area. Denies injuring herself. Walks with shoes at home, but also wears hospital non slip socks. Pain is worse when she first steps on the floor in the am and after sitting a while.    She also has been having R shoulder pain around the same time she has been taking the cholesterol med.Pain gets worse when she starts walking in the am, and if she sits and gets back up.  2-She needs me to order her mammogram for this year  Past Medical History:  Diagnosis Date  . Anemia    hx  . Arthritis    "maybe in my legs" (06/15/2016)  . Complication of anesthesia    woke up during colonoscopy 03-31-16 Dr. Laure Kidney  . GERD (gastroesophageal reflux disease)   . Hyperlipemia   . Hypertension   . Sinus congestion    "chronic" (06/15/2016)  . Sinus headache    "a few/month" (06/15/2016)  . Thyroid nodule 2010   "no problems w/them since; on a pill for a while" (06/15/2016)     Family History  Problem Relation Age of Onset  . COPD Father      Current Outpatient Medications:  .  albuterol (PROAIR HFA) 108 (90 Base) MCG/ACT inhaler, Inhale 2 puffs into the lungs every 6 (six) hours as needed for wheezing or shortness of breath., Disp: 1 Inhaler, Rfl: 5 .  atorvastatin (LIPITOR) 10 MG tablet, TAKE 1 TABLET BY MOUTH ONCE DAILY, Disp: 90 tablet, Rfl: 0 .  Cholecalciferol (VITAMIN D-3 PO), Take 1 capsule by mouth daily., Disp: , Rfl:  .  dorzolamide-timolol (COSOPT) 22.3-6.8 MG/ML ophthalmic solution, Instill 1 drop into both eyes two times a day, Disp: , Rfl:  .  Esomeprazole Magnesium (NEXIUM 24HR PO), Take daily, Disp: , Rfl:  .  fluticasone (FLONASE) 50 MCG/ACT nasal spray, USE 1 SPRAY(S) IN EACH NOSTRIL ONCE DAILY, Disp: 16 g, Rfl: 3 .   hydrochlorothiazide (HYDRODIURIL) 25 MG tablet, TAKE 1 TABLET BY MOUTH ONCE DAILY, Disp: 90 tablet, Rfl: 0 .  levocetirizine (XYZAL) 5 MG tablet, TAKE 1 TABLET BY MOUTH ONCE DAILY IN THE EVENING, Disp: 30 tablet, Rfl: 0 .  potassium chloride (K-DUR) 10 MEQ tablet, TAKE 1 TABLET BY MOUTH ONCE DAILY, Disp: 90 tablet, Rfl: 0 .  ranitidine (ZANTAC) 300 MG tablet, Take 300 mg by mouth daily as needed for heartburn., Disp: , Rfl:  .  traMADol (ULTRAM) 50 MG tablet, Take 1 tablet (50 mg total) by mouth every 6 (six) hours as needed for severe pain., Disp: 20 tablet, Rfl: 0   Allergies  Allergen Reactions  . Aspirin-Acetaminophen-Caffeine Nausea And Vomiting  . Codeine Diarrhea and Nausea And Vomiting    Stomach cramps   . Oxycontin [Oxycodone Hcl] Other (See Comments)    Hallucination      Review of Systems  Constitutional: Negative for activity change, chills, diaphoresis and fatigue.  Musculoskeletal: Positive for gait problem. Negative for arthralgias, joint swelling and myalgias.       R foot pain  Skin: Negative for color change, pallor, rash and wound.  Neurological: Negative for weakness and numbness.  Hematological: Negative  for adenopathy.     Today's Vitals   04/12/18 1451  BP: 120/70  Pulse: 81  Temp: 97.6 F (36.4 C)  TempSrc: Oral  SpO2: 97%  Weight: 248 lb 3.2 oz (112.6 kg)  Height: 5\' 5"  (1.651 m)   Body mass index is 41.3 kg/m.   Objective:  Physical Exam Vitals signs and nursing note reviewed.  Constitutional:      Appearance: Normal appearance.  HENT:     Head: Normocephalic.     Right Ear: External ear normal.     Left Ear: External ear normal.     Nose: Nose normal.  Eyes:     General: No scleral icterus.    Conjunctiva/sclera: Conjunctivae normal.  Neck:     Musculoskeletal: Neck supple.  Pulmonary:     Effort: Pulmonary effort is normal.  Musculoskeletal: Normal range of motion.        General: No swelling, deformity or signs of injury.      Right lower leg: No edema.     Left lower leg: No edema.     Comments: Mid sole of R foot with tender area right at the mid fascia region. There are no wounds or bruises on the sole of foot. ROM is normal.   Neurological:     Mental Status: She is alert.         Assessment And Plan:    1. Acute pain of right shoulder- acute. I told her to d/c the cholesteorl med for 5 days and see if the pain resolves, I doubt is the cholesterol med  I sent her for xray - DG Shoulder Right; Future  2. Plantar fasciitis, right- acute. I taught her stretches to do, needs to only wear good support shoes and if not improving in 7-10 days, I will send her to PT  3. Screening for breast cancer- screen - MM Digital Screening; Future   Jezreel Justiniano RODRIGUEZ-SOUTHWORTH, PA-C

## 2018-04-16 ENCOUNTER — Ambulatory Visit
Admission: RE | Admit: 2018-04-16 | Discharge: 2018-04-16 | Disposition: A | Discharge status: Home / Self Care | Payer: Medicare Other | Source: Ambulatory Visit | Attending: Internal Medicine | Admitting: Internal Medicine

## 2018-04-16 DIAGNOSIS — M25511 Pain in right shoulder: Secondary | ICD-10-CM

## 2018-04-17 ENCOUNTER — Ambulatory Visit: Payer: Medicare Other | Admitting: Internal Medicine

## 2018-04-17 ENCOUNTER — Encounter: Payer: Self-pay | Admitting: Internal Medicine

## 2018-04-17 VITALS — BP 122/70 | HR 69 | Temp 98.2°F | Wt 249.6 lb

## 2018-04-17 DIAGNOSIS — R7303 Prediabetes: Secondary | ICD-10-CM | POA: Diagnosis not present

## 2018-04-17 DIAGNOSIS — Z9114 Patient's other noncompliance with medication regimen: Secondary | ICD-10-CM

## 2018-04-17 DIAGNOSIS — I10 Essential (primary) hypertension: Secondary | ICD-10-CM | POA: Diagnosis not present

## 2018-04-17 DIAGNOSIS — E785 Hyperlipidemia, unspecified: Secondary | ICD-10-CM

## 2018-04-17 DIAGNOSIS — M722 Plantar fascial fibromatosis: Secondary | ICD-10-CM

## 2018-04-17 DIAGNOSIS — K429 Umbilical hernia without obstruction or gangrene: Secondary | ICD-10-CM | POA: Insufficient documentation

## 2018-04-17 DIAGNOSIS — M25511 Pain in right shoulder: Secondary | ICD-10-CM

## 2018-04-17 DIAGNOSIS — Z8639 Personal history of other endocrine, nutritional and metabolic disease: Secondary | ICD-10-CM

## 2018-04-17 DIAGNOSIS — H9313 Tinnitus, bilateral: Secondary | ICD-10-CM

## 2018-04-17 LAB — CMP14 + ANION GAP
ALT: 10 IU/L (ref 0–32)
ANION GAP: 15 mmol/L (ref 10.0–18.0)
AST: 16 IU/L (ref 0–40)
Albumin/Globulin Ratio: 1.6 (ref 1.2–2.2)
Albumin: 4.4 g/dL (ref 3.6–4.8)
Alkaline Phosphatase: 74 IU/L (ref 39–117)
BILIRUBIN TOTAL: 0.5 mg/dL (ref 0.0–1.2)
BUN/Creatinine Ratio: 30 — ABNORMAL HIGH (ref 12–28)
BUN: 22 mg/dL (ref 8–27)
CO2: 28 mmol/L (ref 20–29)
CREATININE: 0.74 mg/dL (ref 0.57–1.00)
Calcium: 10 mg/dL (ref 8.7–10.3)
Chloride: 96 mmol/L (ref 96–106)
GFR calc Af Amer: 96 mL/min/{1.73_m2} (ref >59)
GFR calc non Af Amer: 84 mL/min/{1.73_m2} (ref >59)
GLUCOSE: 95 mg/dL (ref 65–99)
Globulin, Total: 2.7 g/dL (ref 1.5–4.5)
Potassium: 4 mmol/L (ref 3.5–5.2)
Sodium: 139 mmol/L (ref 134–144)
TOTAL PROTEIN: 7.1 g/dL (ref 6.0–8.5)

## 2018-04-17 LAB — CBC
HEMOGLOBIN: 12.1 g/dL (ref 11.1–15.9)
Hematocrit: 35.3 % (ref 34.0–46.6)
MCH: 29.2 pg (ref 26.6–33.0)
MCHC: 34.3 g/dL (ref 31.5–35.7)
MCV: 85 fL (ref 79–97)
PLATELETS: 328 10*3/uL (ref 150–450)
RBC: 4.14 x10E6/uL (ref 3.77–5.28)
RDW: 13.3 % (ref 12.3–15.4)
WBC: 4.4 10*3/uL (ref 3.4–10.8)

## 2018-04-17 LAB — HEMOGLOBIN A1C
Est. average glucose Bld gHb Est-mCnc: 120 mg/dL
HEMOGLOBIN A1C: 5.8 % — AB (ref 4.8–5.6)

## 2018-04-17 LAB — LIPID PANEL
CHOLESTEROL TOTAL: 229 mg/dL — AB (ref 100–199)
Chol/HDL Ratio: 4 ratio (ref 0.0–4.4)
HDL: 57 mg/dL (ref >39)
LDL Calculated: 154 mg/dL — ABNORMAL HIGH (ref 0–99)
Triglycerides: 89 mg/dL (ref 0–149)
VLDL CHOLESTEROL CAL: 18 mg/dL (ref 5–40)

## 2018-04-17 NOTE — Progress Notes (Signed)
Subjective:     Patient ID: Rhonda [name] , female    DOB: Sep 19, 1950 , 67 y.o.   MRN: [id number]   Chief Complaint  Patient presents with  . Hypertension    HPI  Pt is here for Fu HTN, trial of cholesterol medication and has prediabetes. She has been taking the CoQ10 qd, but has not started the cholesterol medication, is planning on doing this tomorrow. Has been making changes in her diet.  She states that her R foot is not improving doing the stretches she has been doing, and also has not heard from her R shoulder xray report.   Past Medical History:  Diagnosis Date  . Anemia    hx  . Arthritis    "maybe in my legs" (06/15/2016)  . Complication of anesthesia    woke up during colonoscopy 03-31-16 Dr. Laure Kidney  . GERD (gastroesophageal reflux disease)   . Hyperlipemia   . Hypertension   . Sinus congestion    "chronic" (06/15/2016)  . Sinus headache    "a few/month" (06/15/2016)  . Thyroid nodule 2010   "no problems w/them since; on a pill for a while" (06/15/2016)     Family History  Problem Relation Age of Onset  . COPD Father     Current Outpatient Medications:  .  albuterol (PROAIR HFA) 108 (90 Base) MCG/ACT inhaler, Inhale 2 puffs into the lungs every 6 (six) hours as needed for wheezing or shortness of breath., Disp: 1 Inhaler, Rfl: 5 .  atorvastatin (LIPITOR) 10 MG tablet, TAKE 1 TABLET BY MOUTH ONCE DAILY, Disp: 90 tablet, Rfl: 0 .  Cholecalciferol (VITAMIN D-3 PO), Take 1 capsule by mouth daily., Disp: , Rfl:  .  dorzolamide-timolol (COSOPT) 22.3-6.8 MG/ML ophthalmic solution, Instill 1 drop into both eyes two times a day, Disp: , Rfl:  .  Esomeprazole Magnesium (NEXIUM 24HR PO), Take daily, Disp: , Rfl:  .  fluticasone (FLONASE) 50 MCG/ACT nasal spray, USE 1 SPRAY(S) IN EACH NOSTRIL ONCE DAILY, Disp: 16 g, Rfl: 3 .  hydrochlorothiazide (HYDRODIURIL) 25 MG tablet, TAKE 1 TABLET BY MOUTH ONCE DAILY, Disp: 90 tablet, Rfl: 0 .  levocetirizine (XYZAL) 5 MG tablet,  TAKE 1 TABLET BY MOUTH ONCE DAILY IN THE EVENING, Disp: 30 tablet, Rfl: 0 .  potassium chloride (K-DUR) 10 MEQ tablet, TAKE 1 TABLET BY MOUTH ONCE DAILY, Disp: 90 tablet, Rfl: 0 .  ranitidine (ZANTAC) 300 MG tablet, Take 300 mg by mouth daily as needed for heartburn., Disp: , Rfl:  .  traMADol (ULTRAM) 50 MG tablet, Take 1 tablet (50 mg total) by mouth every 6 (six) hours as needed for severe pain., Disp: 20 tablet, Rfl: 0   Allergies  Allergen Reactions  . Aspirin-Acetaminophen-Caffeine Nausea And Vomiting  . Codeine Diarrhea and Nausea And Vomiting    Stomach cramps   . Oxycontin [Oxycodone Hcl] Other (See Comments)    Hallucination      Review of Systems  Constitutional: Negative for chills, diaphoresis and fatigue.  HENT: Positive for postnasal drip, rhinorrhea, sneezing and tinnitus. Negative for ear discharge, ear pain and sore throat.        Has been doing Netie Pot rinses which is helping  Respiratory: Negative for chest tightness and shortness of breath.   Cardiovascular: Negative for chest pain, palpitations and leg swelling.  Endocrine: Negative for polydipsia and polyphagia.  Genitourinary: Negative for dysuria and frequency.  Musculoskeletal: Positive for arthralgias.       R shoulder pain and  heel pain  Skin: Negative for rash.  Allergic/Immunologic: Positive for environmental allergies.  Neurological: Negative for headaches.  Hematological: Negative for adenopathy.     Today's Vitals   04/17/18 0929  BP: 122/70  Pulse: 69  Temp: 98.2 F (36.8 C)  TempSrc: Oral  SpO2: 97%  Weight: 249 lb 9.6 oz (113.2 kg)   Body mass index is 41.54 kg/m.   Objective:  Physical Exam   Constitutional: She is oriented to person, place, and time. She appears well-developed and well-nourished. No distress.  HENT:  Head: Normocephalic and atraumatic.  Right Ear: External ear normal.  Left Ear: External ear normal.  Nose: Nose normal.  Eyes: Conjunctivae are normal. Right  eye exhibits no discharge. Left eye exhibits no discharge. No scleral icterus.  Neck: Neck supple. No thyromegaly present.  No carotid bruits bilaterally  Cardiovascular: Normal rate and regular rhythm.  No murmur heard. Pulmonary/Chest: Effort normal and breath sounds normal. No respiratory distress.  Musculoskeletal: Normal range of motion. She exhibits no edema.  Lymphadenopathy:    She has no cervical adenopathy.  Neurological: She is alert and oriented to person, place, and time.  Skin: Skin is warm and dry. Capillary refill takes less than 2 seconds. No rash noted. She is not diaphoretic.  Psychiatric: She has a normal mood and affect. Her behavior is normal. Judgment and thought content normal.  Nursing note reviewed.     Assessment And Plan:   1. Hyperlipidemia, unspecified hyperlipidemia type- chronic.  - Lipid Profile  2. History of hypokalemia- past history of this. CMP ordered  3. Essential hypertension- stable, may continue current meds - CMP14 + Anion Gap - CBC no Diff  4. Prediabetes- advised to increase healthy fats like avocado and chia seeds - Hemoglobin A1c  5. Plantar fasciitis of right foot - Ambulatory referral to Physical Therapy  6. Acute pain of right shoulder- she wants to try PT first before ortho - Ambulatory referral to Physical Therapy  7. Tinnitus aurium, bilateral- chronic, no action  Fu in 3 months  Elsey Holts RODRIGUEZ-SOUTHWORTH, PA-C

## 2018-05-01 ENCOUNTER — Other Ambulatory Visit: Payer: Self-pay

## 2018-05-01 ENCOUNTER — Ambulatory Visit: Payer: Medicare Other | Attending: Internal Medicine | Admitting: Physical Therapy

## 2018-05-01 ENCOUNTER — Encounter: Payer: Self-pay | Admitting: Physical Therapy

## 2018-05-01 DIAGNOSIS — M6281 Muscle weakness (generalized): Secondary | ICD-10-CM | POA: Diagnosis present

## 2018-05-01 DIAGNOSIS — R293 Abnormal posture: Secondary | ICD-10-CM | POA: Diagnosis present

## 2018-05-01 DIAGNOSIS — M25674 Stiffness of right foot, not elsewhere classified: Secondary | ICD-10-CM | POA: Diagnosis present

## 2018-05-01 DIAGNOSIS — G8929 Other chronic pain: Secondary | ICD-10-CM | POA: Insufficient documentation

## 2018-05-01 DIAGNOSIS — M25511 Pain in right shoulder: Secondary | ICD-10-CM | POA: Diagnosis present

## 2018-05-01 DIAGNOSIS — M79671 Pain in right foot: Secondary | ICD-10-CM | POA: Insufficient documentation

## 2018-05-01 DIAGNOSIS — M25611 Stiffness of right shoulder, not elsewhere classified: Secondary | ICD-10-CM | POA: Diagnosis present

## 2018-05-01 NOTE — Patient Instructions (Signed)
   Gastroc / Heel Cord Stretch - Seated With Towel   Sit on floor, towel around ball of foot. Gently pull foot in toward body, stretching heel cord and calf. Hold for _20-30__ seconds. Repeat on involved leg. Repeat _3__ times. Do _3__ times per day.        Posture Tips DO: - stand tall and erect - keep chin tucked in - keep head and shoulders in alignment - check posture regularly in mirror or large window - pull head back against headrest in car seat;  Change your position often.  Sit with lumbar support. DON'T: - slouch or slump while watching TV or reading - sit, stand or lie in one position  for too long;  Sitting is especially hard on the spine so if you sit at a desk/use the computer, then stand up often!   Copyright  VHI. All rights reserved.  Posture - Standing si  Good posture is important. Avoid slouching and forward head thrust. Maintain curve in low back and align ears over shoul- ders, hips over ankles.  Pull your belly button in toward your back bone. Stand or sit with ribs lifted up and chin down  Shoulders in good alignment  Copyright  VHI. All rights reserved.  Posture - Sitting   Sit upright, head facing forward. Try using a roll to support lower back. Keep shoulders relaxed, and avoid rounded back. Keep hips level with knees. Avoid crossing legs for long periods. Sit on sit bones not tail bone.  Copyright  VHI. All rights reserved.   Use a ball on back of shoulder as shown in clinic for trigger point release at home.  Garen LahLawrie Beardsley, PT Certified Exercise Expert for the Aging Adult  05/01/18 12:23 PM Phone: [phone] Fax: [phone]
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
## 2018-05-11 ENCOUNTER — Encounter

## 2018-05-15 ENCOUNTER — Encounter: Payer: Medicare Other | Admitting: Physical Therapy

## 2018-05-22 ENCOUNTER — Encounter: Payer: Medicare Other | Admitting: Physical Therapy

## 2018-05-24 ENCOUNTER — Encounter: Payer: Medicare Other | Admitting: Physical Therapy

## 2018-05-28 IMAGING — CR DG ABDOMEN 2V
3 series · 3 of 3 positions shown · non-contrast
Comparison: CT abdomen pelvis 05/13/2016

CLINICAL DATA: Ileus following gastrointestinal surgery

EXAM:
ABDOMEN - 2 VIEW

[abdomen erect]
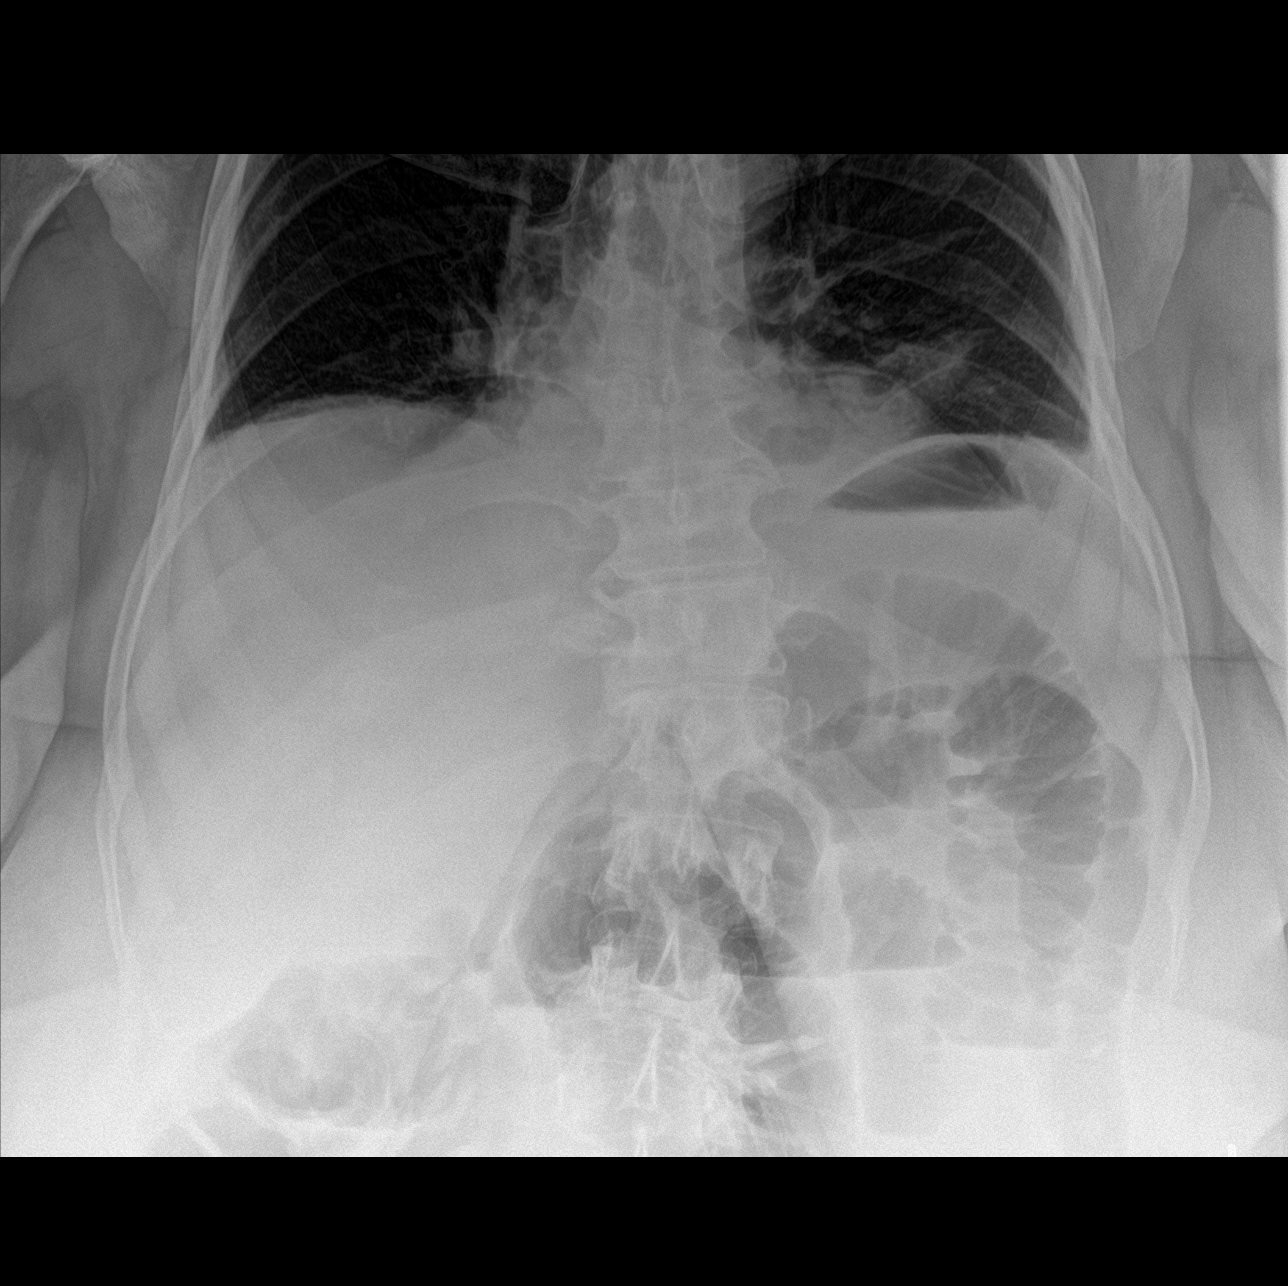

[abdomen supine (1 of 2)]
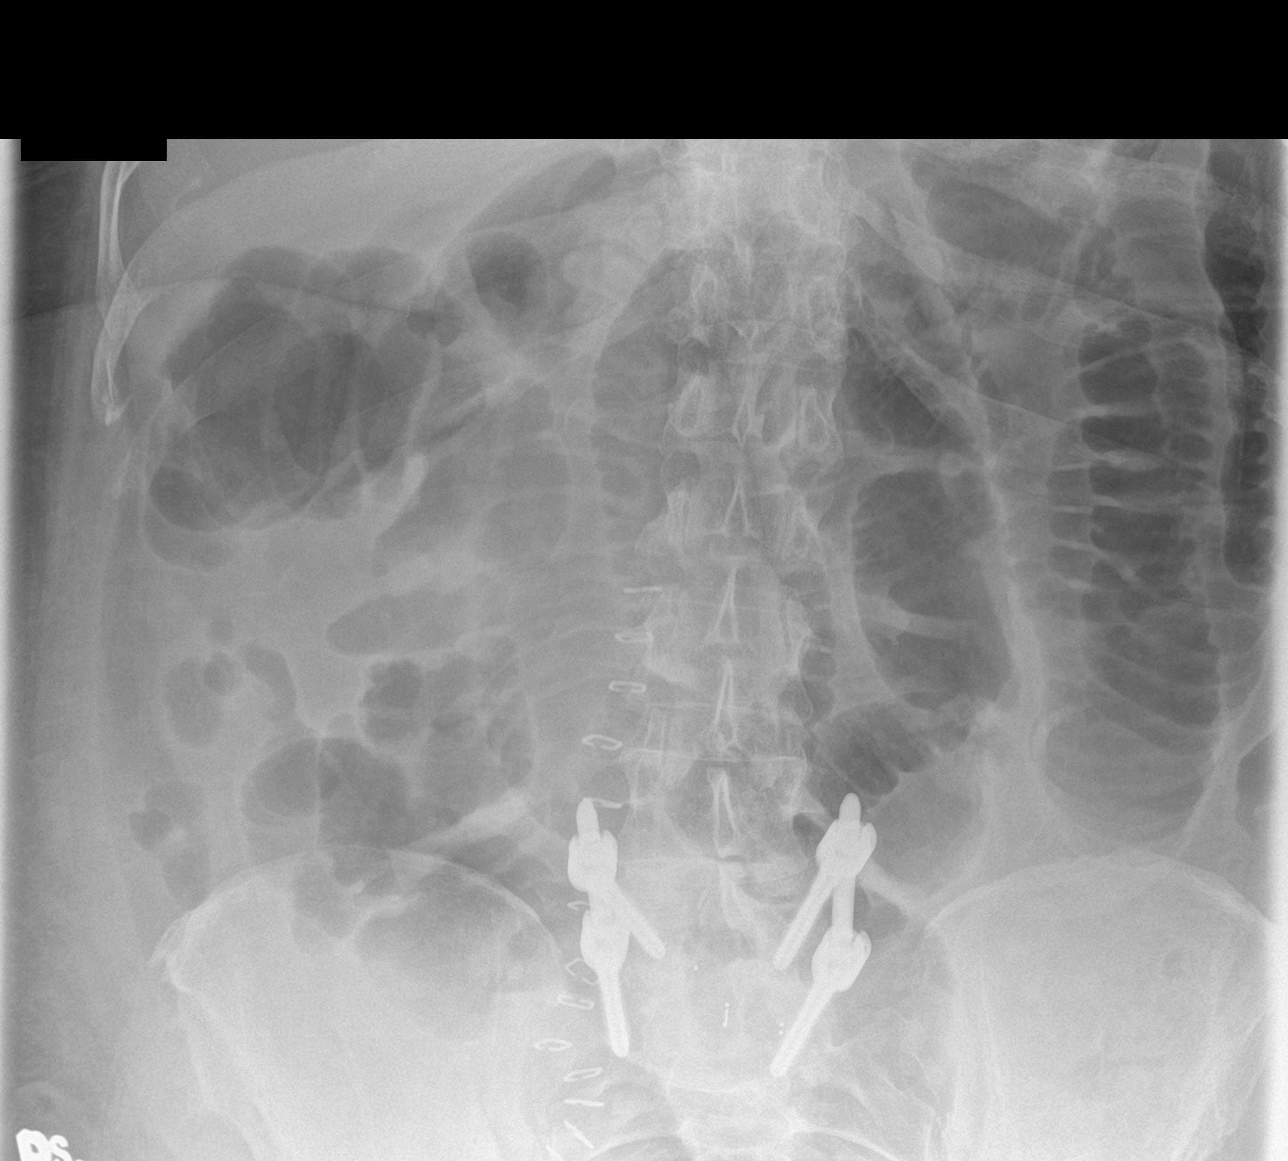

[abdomen supine (2 of 2)]
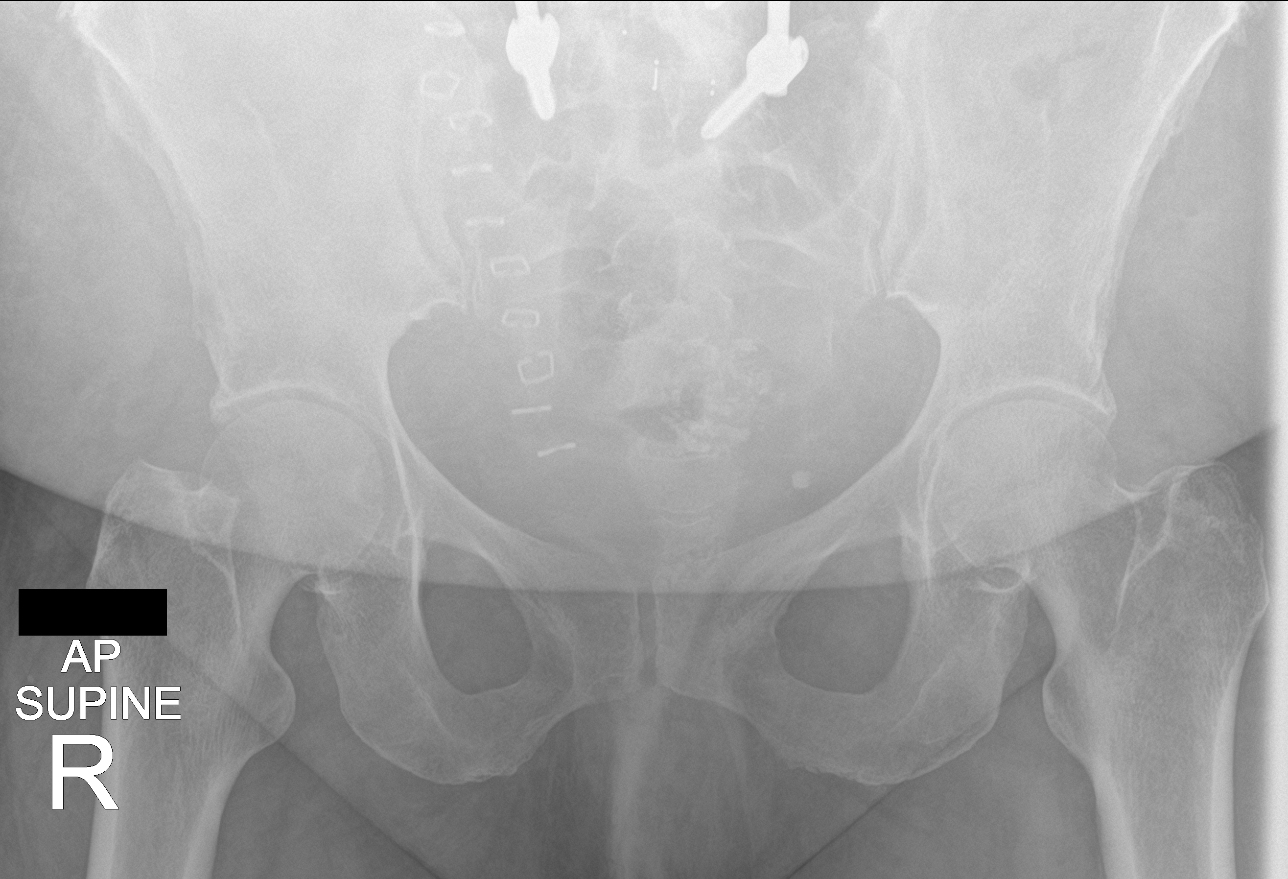

[3 of 3 positions shown; findings below may reference images not displayed]

FINDINGS: Normal heart size and mediastinal contours.

Bibasilar atelectasis and small pleural effusions.

No gross infiltrate.

Gas distention of numerous small bowel loops throughout abdomen.

Some colonic gas is present.

Findings favor postoperative ileus.

No definite bowel wall thickening or free air.

Scattered degenerative changes thoracolumbar spine with L5-S1
fusion.
IMPRESSION: Probable postoperative ileus.

Bibasilar pleural effusions and atelectasis.

## 2018-05-29 ENCOUNTER — Ambulatory Visit
Admission: RE | Admit: 2018-05-29 | Discharge: 2018-05-29 | Disposition: A | Discharge status: Home / Self Care | Payer: Medicare Other | Source: Ambulatory Visit | Attending: Internal Medicine | Admitting: Internal Medicine

## 2018-05-29 ENCOUNTER — Encounter: Payer: Medicare Other | Admitting: Physical Therapy

## 2018-05-29 DIAGNOSIS — Z1239 Encounter for other screening for malignant neoplasm of breast: Secondary | ICD-10-CM

## 2018-05-30 IMAGING — DX DG CHEST 1V PORT
1 series · 1 of 1 positions shown · non-contrast
Comparison: May 07, 2010

CLINICAL DATA: Shortness of Breath

EXAM:
PORTABLE CHEST 1 VIEW

[chest ap]
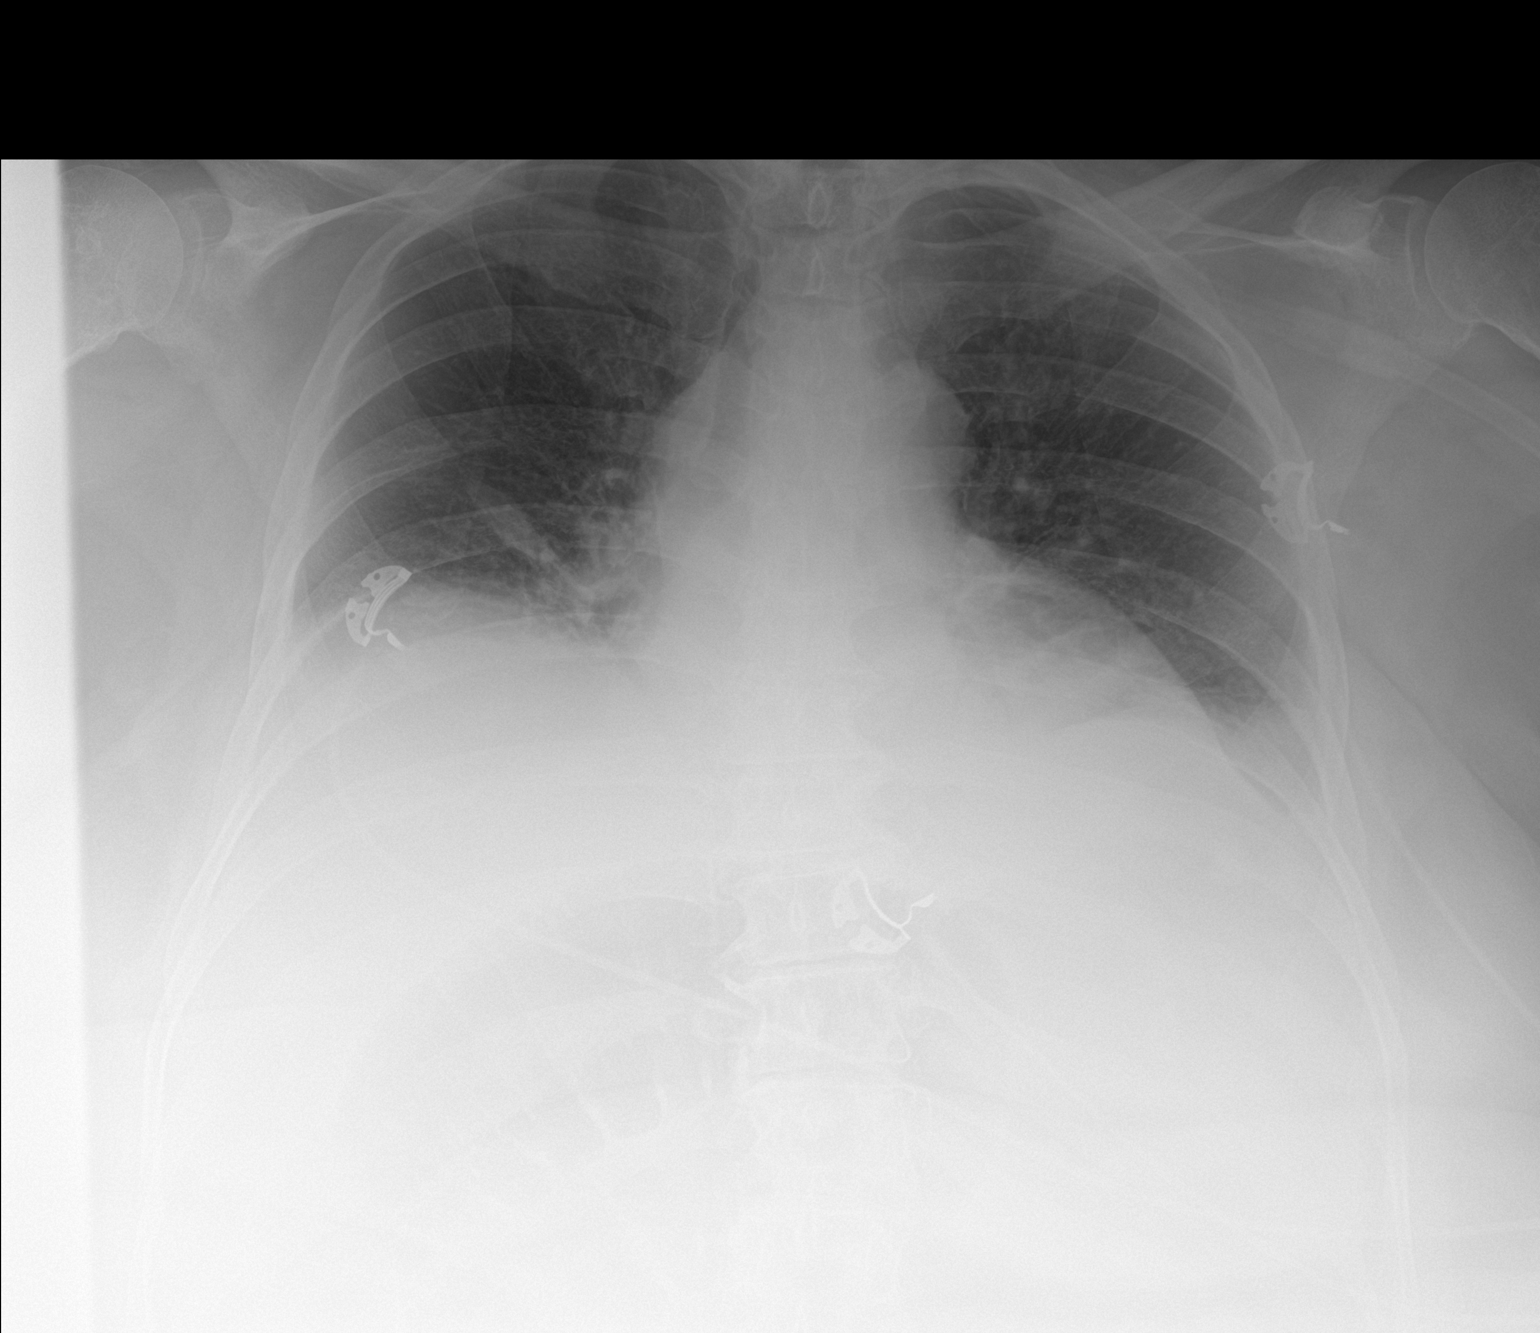

[1 of 1 positions shown; findings below may reference images not displayed]

FINDINGS: Degree of inspiration is shallow. There is patchy bibasilar
atelectasis, more on the right than on the left. Lungs elsewhere are
clear. Heart is upper normal in size with pulmonary vascular within
normal limits. No adenopathy. No bone lesions.
IMPRESSION: Shallow degree of inspiration bibasilar atelectasis, more on the
right than on the left. Lungs elsewhere clear. Stable cardiac
silhouette.

## 2018-05-30 IMAGING — CR DG ABD PORTABLE 1V
1 series · 1 of 1 positions shown · non-contrast
Comparison: Abdominal radiograph performed earlier today at [DATE]
a.m.

CLINICAL DATA: Nasogastric tube placement.  Initial encounter.

EXAM:
PORTABLE ABDOMEN - 1 VIEW

[AP]
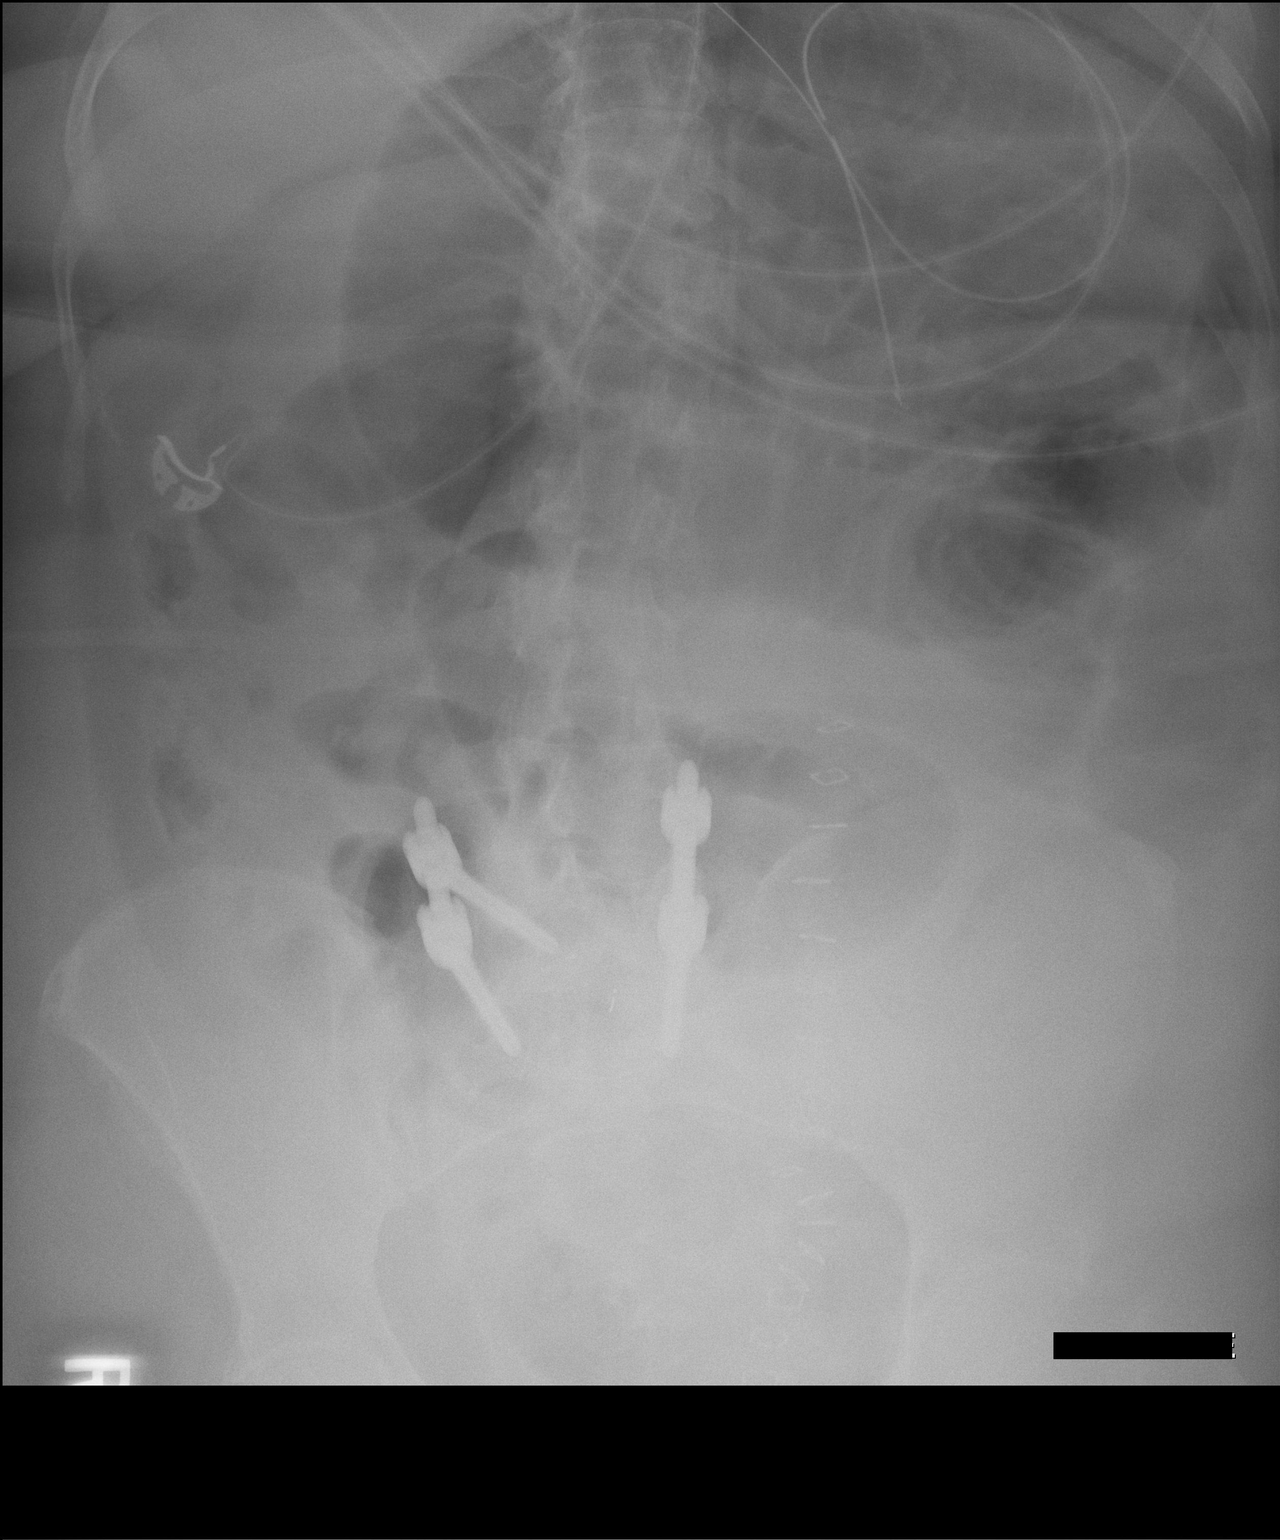

[1 of 1 positions shown; findings below may reference images not displayed]

FINDINGS: Diffuse dilatation of small-bowel loops is again noted, mildly
improved from the prior study but concerning for either persistent
ileus or partial small bowel obstruction. Trace air is noted within
the colon. A Foley catheter is noted, with trace contrast in the
bladder. Postoperative change is noted along the lower abdomen.

The patient's enteric tube is noted ending overlying the body of the
stomach. No acute osseous abnormalities are seen. Lumbosacral spinal
fusion hardware is noted.
IMPRESSION: 1. Enteric tube noted ending overlying the body of the stomach.
2. Diffuse dilatation of small-bowel loops is mildly improved from
the prior study. This may reflect persistent ileus or partial
small-bowel obstruction, slightly improved from the prior study.

## 2018-05-31 ENCOUNTER — Encounter: Payer: Medicare Other | Admitting: Physical Therapy

## 2018-06-05 ENCOUNTER — Encounter: Payer: Medicare Other | Admitting: Physical Therapy

## 2018-06-07 ENCOUNTER — Encounter: Payer: Medicare Other | Admitting: Physical Therapy

## 2018-06-30 ENCOUNTER — Other Ambulatory Visit: Payer: Self-pay | Admitting: Internal Medicine

## 2018-07-03 ENCOUNTER — Ambulatory Visit: Payer: Medicare Other | Admitting: Podiatry

## 2018-07-03 ENCOUNTER — Ambulatory Visit (INDEPENDENT_AMBULATORY_CARE_PROVIDER_SITE_OTHER): Payer: Medicare Other

## 2018-07-03 DIAGNOSIS — M722 Plantar fascial fibromatosis: Secondary | ICD-10-CM | POA: Diagnosis not present

## 2018-07-03 MED ORDER — METHYLPREDNISOLONE 4 MG PO TBPK: ORAL_TABLET | ORAL | 0 refills | Status: DC

## 2018-07-03 NOTE — Patient Instructions (Signed)
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
## 2018-07-10 DIAGNOSIS — M722 Plantar fascial fibromatosis: Secondary | ICD-10-CM | POA: Insufficient documentation

## 2018-07-11 ENCOUNTER — Other Ambulatory Visit: Payer: Self-pay

## 2018-07-11 MED ORDER — POTASSIUM CHLORIDE ER 10 MEQ PO TBCR: 10.0000 meq | EXTENDED_RELEASE_TABLET | Freq: Every day | ORAL | 0 refills | Status: DC

## 2018-07-24 ENCOUNTER — Ambulatory Visit: Payer: Medicare Other | Admitting: Podiatry

## 2018-07-31 ENCOUNTER — Ambulatory Visit: Payer: Medicare Other | Admitting: Internal Medicine

## 2018-08-02 ENCOUNTER — Ambulatory Visit: Payer: Medicare Other | Admitting: Internal Medicine

## 2018-09-18 ENCOUNTER — Telehealth: Payer: Self-pay | Admitting: Internal Medicine

## 2018-09-18 NOTE — Telephone Encounter (Signed)
I called the patient to reschedule her AWV/CPE.  She said that she left a message about a $40 bill she received when she saw Nettie Elm in Sep/Oct.  She said that she called the insurance company and was told that it was billed/charged as a specialist visit, and that's why there's a balance.  She mentioned that she has seen [place] several times before, and it was never charged that way.  She is asking for someone to give her a call back about the bill. VDM (DD)

## 2018-09-27 DIAGNOSIS — H401131 Primary open-angle glaucoma, bilateral, mild stage: Secondary | ICD-10-CM | POA: Insufficient documentation

## 2018-10-02 ENCOUNTER — Other Ambulatory Visit: Payer: Self-pay | Admitting: Nurse Practitioner

## 2018-10-02 ENCOUNTER — Other Ambulatory Visit: Payer: Self-pay | Admitting: Internal Medicine

## 2018-10-24 ENCOUNTER — Encounter: Payer: Medicare Other | Admitting: Internal Medicine

## 2018-10-24 ENCOUNTER — Ambulatory Visit: Payer: Medicare Other

## 2018-11-06 ENCOUNTER — Other Ambulatory Visit: Payer: Self-pay | Admitting: Internal Medicine

## 2018-11-13 ENCOUNTER — Encounter: Payer: Medicare Other | Admitting: Internal Medicine

## 2018-11-13 ENCOUNTER — Encounter: Payer: Medicare Other | Admitting: Nurse Practitioner

## 2018-11-13 ENCOUNTER — Ambulatory Visit: Payer: Medicare Other

## 2018-12-17 ENCOUNTER — Telehealth: Payer: Self-pay | Admitting: Internal Medicine

## 2018-12-17 NOTE — Telephone Encounter (Signed)
I left a message asking the patient to call me at [phone] to reschedule 01/08/2019 AWV/CPE.VDM (DD)

## 2019-01-03 ENCOUNTER — Other Ambulatory Visit: Payer: Self-pay | Admitting: Nurse Practitioner

## 2019-01-04 ENCOUNTER — Other Ambulatory Visit: Payer: Self-pay | Admitting: Nurse Practitioner

## 2019-01-08 ENCOUNTER — Ambulatory Visit: Payer: Medicare Other

## 2019-01-08 ENCOUNTER — Encounter: Payer: Medicare Other | Admitting: Nurse Practitioner

## 2019-01-10 ENCOUNTER — Encounter: Payer: Medicare Other | Admitting: Nurse Practitioner

## 2019-01-10 ENCOUNTER — Other Ambulatory Visit: Payer: Self-pay

## 2019-01-10 ENCOUNTER — Ambulatory Visit (INDEPENDENT_AMBULATORY_CARE_PROVIDER_SITE_OTHER): Payer: Medicare Other | Admitting: Internal Medicine

## 2019-01-10 ENCOUNTER — Ambulatory Visit (INDEPENDENT_AMBULATORY_CARE_PROVIDER_SITE_OTHER): Payer: Medicare Other

## 2019-01-10 ENCOUNTER — Encounter: Payer: Self-pay | Admitting: Internal Medicine

## 2019-01-10 VITALS — BP 158/76 | HR 73 | Temp 98.0°F | Ht 65.0 in | Wt 257.2 lb

## 2019-01-10 VITALS — BP 160/78 | HR 73 | Temp 98.0°F | Ht 65.0 in | Wt 257.2 lb

## 2019-01-10 DIAGNOSIS — E2839 Other primary ovarian failure: Secondary | ICD-10-CM | POA: Diagnosis not present

## 2019-01-10 DIAGNOSIS — I440 Atrioventricular block, first degree: Secondary | ICD-10-CM | POA: Diagnosis not present

## 2019-01-10 DIAGNOSIS — R9431 Abnormal electrocardiogram [ECG] [EKG]: Secondary | ICD-10-CM

## 2019-01-10 DIAGNOSIS — Z1212 Encounter for screening for malignant neoplasm of rectum: Secondary | ICD-10-CM | POA: Diagnosis not present

## 2019-01-10 DIAGNOSIS — Z818 Family history of other mental and behavioral disorders: Secondary | ICD-10-CM

## 2019-01-10 DIAGNOSIS — R739 Hyperglycemia, unspecified: Secondary | ICD-10-CM

## 2019-01-10 DIAGNOSIS — Z0000 Encounter for general adult medical examination without abnormal findings: Secondary | ICD-10-CM

## 2019-01-10 DIAGNOSIS — R413 Other amnesia: Secondary | ICD-10-CM | POA: Diagnosis not present

## 2019-01-10 DIAGNOSIS — J309 Allergic rhinitis, unspecified: Secondary | ICD-10-CM | POA: Insufficient documentation

## 2019-01-10 DIAGNOSIS — Z23 Encounter for immunization: Secondary | ICD-10-CM | POA: Diagnosis not present

## 2019-01-10 DIAGNOSIS — I10 Essential (primary) hypertension: Secondary | ICD-10-CM

## 2019-01-10 DIAGNOSIS — J3089 Other allergic rhinitis: Secondary | ICD-10-CM

## 2019-01-10 DIAGNOSIS — Z1159 Encounter for screening for other viral diseases: Secondary | ICD-10-CM

## 2019-01-10 DIAGNOSIS — Z0001 Encounter for general adult medical examination with abnormal findings: Secondary | ICD-10-CM

## 2019-01-10 DIAGNOSIS — E785 Hyperlipidemia, unspecified: Secondary | ICD-10-CM

## 2019-01-10 LAB — POCT URINALYSIS DIPSTICK
Bilirubin, UA: NEGATIVE
Blood, UA: NEGATIVE
Glucose, UA: NEGATIVE
Ketones, UA: NEGATIVE
Leukocytes, UA: NEGATIVE
Nitrite, UA: NEGATIVE
Protein, UA: NEGATIVE
Spec Grav, UA: 1.015 (ref 1.010–1.025)
Urobilinogen, UA: 0.2 E.U./dL
pH, UA: 5.5 (ref 5.0–8.0)

## 2019-01-10 LAB — POC HEMOCCULT BLD/STL (OFFICE/1-CARD/DIAGNOSTIC): Fecal Occult Blood, POC: NEGATIVE

## 2019-01-10 MED ORDER — BOOSTRIX 5-2.5-18.5 LF-MCG/0.5 IM SUSP: 0.5000 mL | Freq: Once | INTRAMUSCULAR | 0 refills | Status: AC

## 2019-01-10 NOTE — Progress Notes (Addendum)
Subjective:     Patient ID: Rhonda [name] , female    DOB: Mar 28, 1951 , 68 y.o.   MRN: [id number]   CC " physical"  HPI Pt is here for medicare wellness visit and annual physical. Is having trouble with her memory. Alzheimer's runs in her family.    Past Medical History:  Diagnosis Date  . Anemia    hx  . Arthritis    "maybe in my legs" (06/15/2016)  . Complication of anesthesia    woke up during colonoscopy 03-31-16 Dr. Laure Kidney  . GERD (gastroesophageal reflux disease)   . Hyperlipemia   . Hypertension   . Sinus congestion    "chronic" (06/15/2016)  . Sinus headache    "a few/month" (06/15/2016)  . Thyroid nodule 2010   "no problems w/them since; on a pill for a while" (06/15/2016)     Family History  Problem Relation Age of Onset  . COPD Father   . Breast cancer Neg Hx   Alzheimer's disease   Current Outpatient Medications:  .  albuterol (PROAIR HFA) 108 (90 Base) MCG/ACT inhaler, Inhale 2 puffs into the lungs every 6 (six) hours as needed for wheezing or shortness of breath., Disp: 1 Inhaler, Rfl: 5 .  atorvastatin (LIPITOR) 10 MG tablet, Take 1 tablet by mouth once daily, Disp: 90 tablet, Rfl: 0 .  Cholecalciferol (VITAMIN D-3 PO), Take 1 capsule by mouth daily., Disp: , Rfl:  .  dorzolamide-timolol (COSOPT) 22.3-6.8 MG/ML ophthalmic solution, Instill 1 drop into both eyes two times a day, Disp: , Rfl:  .  Esomeprazole Magnesium (NEXIUM 24HR PO), Take daily, Disp: , Rfl:  .  fluticasone (FLONASE) 50 MCG/ACT nasal spray, USE 1 SPRAY(S) IN EACH NOSTRIL ONCE DAILY, Disp: 16 g, Rfl: 3 .  hydrochlorothiazide (HYDRODIURIL) 25 MG tablet, Take 1 tablet by mouth once daily, Disp: 90 tablet, Rfl: 0 .  levocetirizine (XYZAL) 5 MG tablet, TAKE 1 TABLET BY MOUTH ONCE DAILY IN THE EVENING, Disp: 30 tablet, Rfl: 0 .  losartan (COZAAR) 100 MG tablet, Take 1 tablet by mouth once daily, Disp: 90 tablet, Rfl: 0 .  methylPREDNISolone (MEDROL DOSEPAK) 4 MG TBPK tablet, Take as directed  (Patient not taking: Reported on 01/10/2019), Disp: 21 tablet, Rfl: 0 .  potassium chloride (K-DUR) 10 MEQ tablet, Take 1 tablet by mouth once daily, Disp: 30 tablet, Rfl: 0 .  ranitidine (ZANTAC) 300 MG tablet, Take 300 mg by mouth daily as needed for heartburn., Disp: , Rfl:  .  Tdap (BOOSTRIX) 5-2.5-18.5 LF-MCG/0.5 injection, Inject 0.5 mLs into the muscle once for 1 dose., Disp: 0.5 mL, Rfl: 0 .  traMADol (ULTRAM) 50 MG tablet, Take 1 tablet (50 mg total) by mouth every 6 (six) hours as needed for severe pain. (Patient not taking: Reported on 05/01/2018), Disp: 20 tablet, Rfl: 0   Allergies  Allergen Reactions  . Aspirin-Acetaminophen-Caffeine Nausea And Vomiting  . Codeine Diarrhea and Nausea And Vomiting    Stomach cramps   . Oxycontin [Oxycodone Hcl] Other (See Comments)    Hallucination      Review of Systems  One week ago woke up dizzy and took antihistamine and dizziness got better. Has been having itching and popping in both her ears. Has not been taking antihistamines. Has been using her nose sprays. Denies fever, but has face pressure. Has not been doing saline nose rinses.  Urge incontinence if she lets her bladder too full. Denies dysuria or frequency. The rest is neg.  Today's Vitals  01/10/19 0943  BP: (!) 160/78  Pulse: 73  Temp: 98 F (36.7 C)  TempSrc: Oral  Weight: 257 lb 3.2 oz (116.7 kg)  Height: 5\' 5"  (1.651 m)   Body mass index is 42.8 kg/m.   Objective:  Physical Exam  BP (!) 160/78 (Patient Position: Sitting) Comment: No Meds  Pulse 73   Temp 98 F (36.7 C) (Oral)   Ht 5\' 5"  (1.651 m)   Wt 257 lb 3.2 oz (116.7 kg)   BMI 42.80 kg/m  Repeated BP 158/76 General Appearance:    Alert, cooperative, no distress, appears stated age  Head:    Normocephalic, without obvious abnormality, atraumatic  Eyes:    PERRL, conjunctiva/corneas clear, EOM's intact, fundi    benign, both eyes  Ears:    Normal TM's and external ear canals, both ears  Nose:   Nares  normal, septum midline, mucosa normal. Has clear post nasal drainage on pharynx. Has mild tenderness on all her sinuses  Throat:   Lips, mucosa, and tongue normal; teeth and gums normal  Neck:   Supple, symmetrical, trachea midline, no adenopathy;    thyroid:  no enlargement/tenderness/nodules; no carotid   bruit  Back:     Symmetric, no curvature, ROM normal, no CVA tenderness  Lungs:     Clear to auscultation bilaterally, respirations unlabored  Chest Wall:    No tenderness or deformity   Heart:    Regular rate and rhythm, S1 and S2 normal, no murmur, rub   or gallop  Breast Exam:    No tenderness, masses, or nipple abnormality  Abdomen:     Soft, non-tender, bowel sounds active all four quadrants,    no masses, no organomegaly     Rectal:    Normal tone, normal prostate, no masses or tenderness;   guaiac negative stool  Extremities:   Extremities normal, atraumatic, no cyanosis or edema  Pulses:   2+ and symmetric all extremities  Skin:   Skin color, texture, turgor normal, no rashes or lesions  Lymph nodes:   Cervical, supraclavicular, and axillary nodes normal  Neurologic:   CNII-XII intact, normal strength, sensation and reflexes    throughout      Assessment And Plan:     1. Essential hypertension- not well controlled today. She has not taken her medications this am. She was encouraged to always take it in the am.  FU 3-6 months or 1st available with Dr Allyne Gee.   - EKG 12-Lead-borderline with 1st degree AV block unchanged when compared to 11/20/2017.  - POCT Urinalysis Dipstick (81002) - CBC no Diff - CMP14 + Anion Gap   3. Screening for rectal cancer - POC Hemoccult Bld/Stl (1-Cd Office Dx)  4. Decreased estrogen level- chronic - DG DXA FRACTURE ASSESSMENT; Future  5. Non-seasonal allergic rhinitis, unspecified trigger- acute. No signs of bacterial infection today  6. Encounter for general adult medical examination with abnormal findings- routine. FU 1 y  7.  Abnormal EKG- borderline with 1st degree A-V block which is unchanged since last year, but has not seen a cardiologist in 20 years.  - Ambulatory referral to Cardiology  8. Class 3 severe obesity due to excess calories without serious comorbidity in adult, unspecified BMI (HCC)- chronic. Wants to loose 100 lbs.  - Amb Ref to Medical Weight Management  9. Hyperlipidemia, unspecified hyperlipidemia type- chronic. - Lipid Profile - TSH - T4, Free - T3, free  10. Hyperglycemia- chronic. Advised to d/c splenda and use Stevia instead.  -  Hemoglobin A1c  11. Encounter for hepatitis C screening test for low risk patient- screen - Hepatitis c antibody (reflex)  12. Memory difficulties- new. Has strong family hx of Alzheimer's.  - Ambulatory referral to Neurology 13- Allergic rhinitis- advised to do saline rinses bid and start back on her allergy medication.    Aubra Pappalardo RODRIGUEZ-SOUTHWORTH, PA-C    THE PATIENT IS ENCOURAGED TO PRACTICE SOCIAL DISTANCING DUE TO THE COVID-19 PANDEMIC.
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
## 2019-01-10 NOTE — Patient Instructions (Signed)
Rhonda [name] , Thank you for taking time to come for your Medicare Wellness Visit. I appreciate your ongoing commitment to your health goals. Please review the following plan we discussed and let me know if I can assist you in the future.   Screening recommendations/referrals: Colonoscopy: 01/2017 Mammogram: 05/2018 Bone Density: 06/2015 Recommended yearly ophthalmology/optometry visit for glaucoma screening and checkup Recommended yearly dental visit for hygiene and checkup  Vaccinations: Influenza vaccine: 04/2018 Pneumococcal vaccine: 10/2015 Tdap vaccine: sent to pharmacy Shingles vaccine: discussed    Advanced directives: Advance directive discussed with you today. Even though you declined this today please call our office should you change your mind and we can give you the proper paperwork for you to fill out.   Conditions/risks identified: obesity  Next appointment: 01/10/2019   Preventive Care 45 Years and Older, Female Preventive care refers to lifestyle choices and visits with your health care provider that can promote health and wellness. What does preventive care include?  A yearly physical exam. This is also called an annual well check.  Dental exams once or twice a year.  Routine eye exams. Ask your health care provider how often you should have your eyes checked.  Personal lifestyle choices, including:  Daily care of your teeth and gums.  Regular physical activity.  Eating a healthy diet.  Avoiding tobacco and drug use.  Limiting alcohol use.  Practicing safe sex.  Taking low-dose aspirin every day.  Taking vitamin and mineral supplements as recommended by your health care provider. What happens during an annual well check? The services and screenings done by your health care provider during your annual well check will depend on your age, overall health, lifestyle risk factors, and family history of disease. Counseling  Your health care provider may  ask you questions about your:  Alcohol use.  Tobacco use.  Drug use.  Emotional well-being.  Home and relationship well-being.  Sexual activity.  Eating habits.  History of falls.  Memory and ability to understand (cognition).  Work and work Statistician.  Reproductive health. Screening  You may have the following tests or measurements:  Height, weight, and BMI.  Blood pressure.  Lipid and cholesterol levels. These may be checked every 5 years, or more frequently if you are over 79 years old.  Skin check.  Lung cancer screening. You may have this screening every year starting at age 64 if you have a 30-pack-year history of smoking and currently smoke or have quit within the past 15 years.  Fecal occult blood test (FOBT) of the stool. You may have this test every year starting at age 80.  Flexible sigmoidoscopy or colonoscopy. You may have a sigmoidoscopy every 5 years or a colonoscopy every 10 years starting at age 82.  Hepatitis C blood test.  Hepatitis B blood test.  Sexually transmitted disease (STD) testing.  Diabetes screening. This is done by checking your blood sugar (glucose) after you have not eaten for a while (fasting). You may have this done every 1-3 years.  Bone density scan. This is done to screen for osteoporosis. You may have this done starting at age 38.  Mammogram. This may be done every 1-2 years. Talk to your health care provider about how often you should have regular mammograms. Talk with your health care provider about your test results, treatment options, and if necessary, the need for more tests. Vaccines  Your health care provider may recommend certain vaccines, such as:  Influenza vaccine. This is recommended every  year.  Tetanus, diphtheria, and acellular pertussis (Tdap, Td) vaccine. You may need a Td booster every 10 years.  Zoster vaccine. You may need this after age 68.  Pneumococcal 13-valent conjugate (PCV13) vaccine. One  dose is recommended after age 5.  Pneumococcal polysaccharide (PPSV23) vaccine. One dose is recommended after age 48. Talk to your health care provider about which screenings and vaccines you need and how often you need them. This information is not intended to replace advice given to you by your health care provider. Make sure you discuss any questions you have with your health care provider. Document Released: 05/15/2015 Document Revised: 01/06/2016 Document Reviewed: 02/17/2015 Elsevier Interactive Patient Education  2017 [place] Prevention in the Home Falls can cause injuries. They can happen to people of all ages. There are many things you can do to make your home safe and to help prevent falls. What can I do on the outside of my home?  Regularly fix the edges of walkways and driveways and fix any cracks.  Remove anything that might make you trip as you walk through a door, such as a raised step or threshold.  Trim any bushes or trees on the path to your home.  Use bright outdoor lighting.  Clear any walking paths of anything that might make someone trip, such as rocks or tools.  Regularly check to see if handrails are loose or broken. Make sure that both sides of any steps have handrails.  Any raised decks and porches should have guardrails on the edges.  Have any leaves, snow, or ice cleared regularly.  Use sand or salt on walking paths during winter.  Clean up any spills in your garage right away. This includes oil or grease spills. What can I do in the bathroom?  Use night lights.  Install grab bars by the toilet and in the tub and shower. Do not use towel bars as grab bars.  Use non-skid mats or decals in the tub or shower.  If you need to sit down in the shower, use a plastic, non-slip stool.  Keep the floor dry. Clean up any water that spills on the floor as soon as it happens.  Remove soap buildup in the tub or shower regularly.  Attach bath  mats securely with double-sided non-slip rug tape.  Do not have throw rugs and other things on the floor that can make you trip. What can I do in the bedroom?  Use night lights.  Make sure that you have a light by your bed that is easy to reach.  Do not use any sheets or blankets that are too big for your bed. They should not hang down onto the floor.  Have a firm chair that has side arms. You can use this for support while you get dressed.  Do not have throw rugs and other things on the floor that can make you trip. What can I do in the kitchen?  Clean up any spills right away.  Avoid walking on wet floors.  Keep items that you use a lot in easy-to-reach places.  If you need to reach something above you, use a strong step stool that has a grab bar.  Keep electrical cords out of the way.  Do not use floor polish or wax that makes floors slippery. If you must use wax, use non-skid floor wax.  Do not have throw rugs and other things on the floor that can make you trip. What can  I do with my stairs?  Do not leave any items on the stairs.  Make sure that there are handrails on both sides of the stairs and use them. Fix handrails that are broken or loose. Make sure that handrails are as long as the stairways.  Check any carpeting to make sure that it is firmly attached to the stairs. Fix any carpet that is loose or worn.  Avoid having throw rugs at the top or bottom of the stairs. If you do have throw rugs, attach them to the floor with carpet tape.  Make sure that you have a light switch at the top of the stairs and the bottom of the stairs. If you do not have them, ask someone to add them for you. What else can I do to help prevent falls?  Wear shoes that:  Do not have high heels.  Have rubber bottoms.  Are comfortable and fit you well.  Are closed at the toe. Do not wear sandals.  If you use a stepladder:  Make sure that it is fully opened. Do not climb a closed  stepladder.  Make sure that both sides of the stepladder are locked into place.  Ask someone to hold it for you, if possible.  Clearly mark and make sure that you can see:  Any grab bars or handrails.  First and last steps.  Where the edge of each step is.  Use tools that help you move around (mobility aids) if they are needed. These include:  Canes.  Walkers.  Scooters.  Crutches.  Turn on the lights when you go into a dark area. Replace any light bulbs as soon as they burn out.  Set up your furniture so you have a clear path. Avoid moving your furniture around.  If any of your floors are uneven, fix them.  If there are any pets around you, be aware of where they are.  Review your medicines with your doctor. Some medicines can make you feel dizzy. This can increase your chance of falling. Ask your doctor what other things that you can do to help prevent falls. This information is not intended to replace advice given to you by your health care provider. Make sure you discuss any questions you have with your health care provider. Document Released: 02/12/2009 Document Revised: 09/24/2015 Document Reviewed: 05/23/2014 Elsevier Interactive Patient Education  2017 [employer].

## 2019-01-10 NOTE — Patient Instructions (Addendum)
Do saline rinses twice a day for 5-7 days. Get back on your allergy pill for 7-10 days.   Preventive Care 24 Years and Older, Female Preventive care refers to lifestyle choices and visits with your health care provider that can promote health and wellness. This includes:  A yearly physical exam. This is also called an annual well check.  Regular dental and eye exams.  Immunizations.  Screening for certain conditions.  Healthy lifestyle choices, such as diet and exercise. What can I expect for my preventive care visit? Physical exam Your health care provider will check:  Height and weight. These may be used to calculate body mass index (BMI), which is a measurement that tells if you are at a healthy weight.  Heart rate and blood pressure.  Your skin for abnormal spots. Counseling Your health care provider may ask you questions about:  Alcohol, tobacco, and drug use.  Emotional well-being.  Home and relationship well-being.  Sexual activity.  Eating habits.  History of falls.  Memory and ability to understand (cognition).  Work and work Statistician.  Pregnancy and menstrual history. What immunizations do I need?  Influenza (flu) vaccine  This is recommended every year. Tetanus, diphtheria, and pertussis (Tdap) vaccine  You may need a Td booster every 10 years. Varicella (chickenpox) vaccine  You may need this vaccine if you have not already been vaccinated. Zoster (shingles) vaccine  You may need this after age 70. Pneumococcal conjugate (PCV13) vaccine  One dose is recommended after age 43. Pneumococcal polysaccharide (PPSV23) vaccine  One dose is recommended after age 80. Measles, mumps, and rubella (MMR) vaccine  You may need at least one dose of MMR if you were born in 1957 or later. You may also need a second dose. Meningococcal conjugate (MenACWY) vaccine  You may need this if you have certain conditions. Hepatitis A vaccine  You may need  this if you have certain conditions or if you travel or work in places where you may be exposed to hepatitis A. Hepatitis B vaccine  You may need this if you have certain conditions or if you travel or work in places where you may be exposed to hepatitis B. Haemophilus influenzae type b (Hib) vaccine  You may need this if you have certain conditions. You may receive vaccines as individual doses or as more than one vaccine together in one shot (combination vaccines). Talk with your health care provider about the risks and benefits of combination vaccines. What tests do I need? Blood tests  Lipid and cholesterol levels. These may be checked every 5 years, or more frequently depending on your overall health.  Hepatitis C test.  Hepatitis B test. Screening  Lung cancer screening. You may have this screening every year starting at age 85 if you have a 30-pack-year history of smoking and currently smoke or have quit within the past 15 years.  Colorectal cancer screening. All adults should have this screening starting at age 40 and continuing until age 68. Your health care provider may recommend screening at age 26 if you are at increased risk. You will have tests every 1-10 years, depending on your results and the type of screening test.  Diabetes screening. This is done by checking your blood sugar (glucose) after you have not eaten for a while (fasting). You may have this done every 1-3 years.  Mammogram. This may be done every 1-2 years. Talk with your health care provider about how often you should have regular mammograms.  BRCA-related cancer screening. This may be done if you have a family history of breast, ovarian, tubal, or peritoneal cancers. Other tests  Sexually transmitted disease (STD) testing.  Bone density scan. This is done to screen for osteoporosis. You may have this done starting at age 75. Follow these instructions at home: Eating and drinking  Eat a diet that  includes fresh fruits and vegetables, whole grains, lean protein, and low-fat dairy products. Limit your intake of foods with high amounts of sugar, saturated fats, and salt.  Take vitamin and mineral supplements as recommended by your health care provider.  Do not drink alcohol if your health care provider tells you not to drink.  If you drink alcohol: ? Limit how much you have to 0-1 drink a day. ? Be aware of how much alcohol is in your drink. In the [place] one drink equals one 12 oz bottle of beer (355 mL), one 5 oz glass of wine (148 mL), or one 1 oz glass of hard liquor (44 mL). Lifestyle  Take daily care of your teeth and gums.  Stay active. Exercise for at least 30 minutes on 5 or more days each week.  Do not use any products that contain nicotine or tobacco, such as cigarettes, e-cigarettes, and chewing tobacco. If you need help quitting, ask your health care provider.  If you are sexually active, practice safe sex. Use a condom or other form of protection in order to prevent STIs (sexually transmitted infections).  Talk with your health care provider about taking a low-dose aspirin or statin. What's next?  Go to your health care provider once a year for a well check visit.  Ask your health care provider how often you should have your eyes and teeth checked.  Stay up to date on all vaccines. This information is not intended to replace advice given to you by your health care provider. Make sure you discuss any questions you have with your health care provider. Document Released: 05/15/2015 Document Revised: 04/12/2018 Document Reviewed: 04/12/2018 Elsevier Patient Education  2020 [employer].

## 2019-01-11 LAB — CMP14 + ANION GAP
ALT: 10 IU/L (ref 0–32)
AST: 18 IU/L (ref 0–40)
Albumin/Globulin Ratio: 2.1 (ref 1.2–2.2)
Albumin: 4.6 g/dL (ref 3.8–4.8)
Alkaline Phosphatase: 71 IU/L (ref 39–117)
Anion Gap: 12 mmol/L (ref 10.0–18.0)
BUN/Creatinine Ratio: 15 (ref 12–28)
BUN: 11 mg/dL (ref 8–27)
Bilirubin Total: 0.3 mg/dL (ref 0.0–1.2)
CO2: 30 mmol/L — ABNORMAL HIGH (ref 20–29)
Calcium: 9.2 mg/dL (ref 8.7–10.3)
Chloride: 98 mmol/L (ref 96–106)
Creatinine, Ser: 0.75 mg/dL (ref 0.57–1.00)
GFR calc Af Amer: 94 mL/min/{1.73_m2} (ref >59)
GFR calc non Af Amer: 82 mL/min/{1.73_m2} (ref >59)
Globulin, Total: 2.2 g/dL (ref 1.5–4.5)
Glucose: 109 mg/dL — ABNORMAL HIGH (ref 65–99)
Potassium: 3.1 mmol/L — ABNORMAL LOW (ref 3.5–5.2)
Sodium: 140 mmol/L (ref 134–144)
Total Protein: 6.8 g/dL (ref 6.0–8.5)

## 2019-01-11 LAB — CBC
Hematocrit: 34.8 % (ref 34.0–46.6)
Hemoglobin: 11.9 g/dL (ref 11.1–15.9)
MCH: 28.6 pg (ref 26.6–33.0)
MCHC: 34.2 g/dL (ref 31.5–35.7)
MCV: 84 fL (ref 79–97)
Platelets: 297 10*3/uL (ref 150–450)
RBC: 4.16 x10E6/uL (ref 3.77–5.28)
RDW: 13.5 % (ref 11.7–15.4)
WBC: 4.8 10*3/uL (ref 3.4–10.8)

## 2019-01-11 LAB — LIPID PANEL
Chol/HDL Ratio: 3.5 ratio (ref 0.0–4.4)
Cholesterol, Total: 184 mg/dL (ref 100–199)
HDL: 52 mg/dL (ref >39)
LDL Chol Calc (NIH): 118 mg/dL — ABNORMAL HIGH (ref 0–99)
Triglycerides: 77 mg/dL (ref 0–149)
VLDL Cholesterol Cal: 14 mg/dL (ref 5–40)

## 2019-01-11 LAB — HEMOGLOBIN A1C
Est. average glucose Bld gHb Est-mCnc: 128 mg/dL
Hgb A1c MFr Bld: 6.1 % — ABNORMAL HIGH (ref 4.8–5.6)

## 2019-01-11 LAB — HEPATITIS C ANTIBODY (REFLEX): HCV Ab: 0.1 s/co ratio (ref 0.0–0.9)

## 2019-01-11 LAB — T3, FREE: T3, Free: 2.9 pg/mL (ref 2.0–4.4)

## 2019-01-11 LAB — TSH: TSH: 0.932 u[IU]/mL (ref 0.450–4.500)

## 2019-01-11 LAB — T4, FREE: Free T4: 1.33 ng/dL (ref 0.82–1.77)

## 2019-01-11 LAB — HCV COMMENT:

## 2019-01-17 ENCOUNTER — Other Ambulatory Visit: Payer: Self-pay | Admitting: Internal Medicine

## 2019-01-17 DIAGNOSIS — E876 Hypokalemia: Secondary | ICD-10-CM

## 2019-01-17 NOTE — Progress Notes (Signed)
BMP ordered to follow on hypokalemia. Asked pt via mychart to increase her potasium to 20 meq./ day

## 2019-02-04 ENCOUNTER — Other Ambulatory Visit: Payer: Self-pay | Admitting: Internal Medicine

## 2019-02-13 ENCOUNTER — Emergency Department (HOSPITAL_COMMUNITY): Payer: Medicare Other

## 2019-02-13 ENCOUNTER — Encounter (HOSPITAL_COMMUNITY): Payer: Self-pay | Admitting: Emergency Medicine

## 2019-02-13 ENCOUNTER — Other Ambulatory Visit: Payer: Self-pay

## 2019-02-13 ENCOUNTER — Inpatient Hospital Stay (HOSPITAL_COMMUNITY)
Admission: EM | Admit: 2019-02-13 | Discharge: 2019-02-16 | DRG: 355 | Disposition: A | Discharge status: Home / Self Care | Payer: Medicare Other | Attending: General Surgery | Admitting: General Surgery

## 2019-02-13 DIAGNOSIS — Z8719 Personal history of other diseases of the digestive system: Secondary | ICD-10-CM

## 2019-02-13 DIAGNOSIS — K219 Gastro-esophageal reflux disease without esophagitis: Secondary | ICD-10-CM | POA: Diagnosis present

## 2019-02-13 DIAGNOSIS — M1990 Unspecified osteoarthritis, unspecified site: Secondary | ICD-10-CM | POA: Diagnosis present

## 2019-02-13 DIAGNOSIS — E041 Nontoxic single thyroid nodule: Secondary | ICD-10-CM | POA: Diagnosis present

## 2019-02-13 DIAGNOSIS — Z7951 Long term (current) use of inhaled steroids: Secondary | ICD-10-CM

## 2019-02-13 DIAGNOSIS — Z20828 Contact with and (suspected) exposure to other viral communicable diseases: Secondary | ICD-10-CM | POA: Diagnosis present

## 2019-02-13 DIAGNOSIS — Z23 Encounter for immunization: Secondary | ICD-10-CM

## 2019-02-13 DIAGNOSIS — K660 Peritoneal adhesions (postprocedural) (postinfection): Secondary | ICD-10-CM | POA: Diagnosis present

## 2019-02-13 DIAGNOSIS — J45909 Unspecified asthma, uncomplicated: Secondary | ICD-10-CM | POA: Diagnosis present

## 2019-02-13 DIAGNOSIS — E876 Hypokalemia: Secondary | ICD-10-CM | POA: Diagnosis present

## 2019-02-13 DIAGNOSIS — K430 Incisional hernia with obstruction, without gangrene: Principal | ICD-10-CM | POA: Diagnosis present

## 2019-02-13 DIAGNOSIS — K436 Other and unspecified ventral hernia with obstruction, without gangrene: Secondary | ICD-10-CM

## 2019-02-13 DIAGNOSIS — I10 Essential (primary) hypertension: Secondary | ICD-10-CM | POA: Diagnosis present

## 2019-02-13 DIAGNOSIS — R109 Unspecified abdominal pain: Secondary | ICD-10-CM | POA: Diagnosis not present

## 2019-02-13 DIAGNOSIS — Z886 Allergy status to analgesic agent status: Secondary | ICD-10-CM

## 2019-02-13 DIAGNOSIS — Z86711 Personal history of pulmonary embolism: Secondary | ICD-10-CM

## 2019-02-13 DIAGNOSIS — K56699 Other intestinal obstruction unspecified as to partial versus complete obstruction: Secondary | ICD-10-CM

## 2019-02-13 DIAGNOSIS — Z885 Allergy status to narcotic agent status: Secondary | ICD-10-CM

## 2019-02-13 DIAGNOSIS — Z825 Family history of asthma and other chronic lower respiratory diseases: Secondary | ICD-10-CM

## 2019-02-13 DIAGNOSIS — H409 Unspecified glaucoma: Secondary | ICD-10-CM | POA: Diagnosis present

## 2019-02-13 DIAGNOSIS — Z79899 Other long term (current) drug therapy: Secondary | ICD-10-CM

## 2019-02-13 DIAGNOSIS — E785 Hyperlipidemia, unspecified: Secondary | ICD-10-CM | POA: Diagnosis present

## 2019-02-13 LAB — COMPREHENSIVE METABOLIC PANEL
ALT: 19 U/L (ref 0–44)
AST: 20 U/L (ref 15–41)
Albumin: 4.5 g/dL (ref 3.5–5.0)
Alkaline Phosphatase: 63 U/L (ref 38–126)
Anion gap: 16 — ABNORMAL HIGH (ref 5–15)
BUN: 14 mg/dL (ref 8–23)
CO2: 26 mmol/L (ref 22–32)
Calcium: 10.1 mg/dL (ref 8.9–10.3)
Chloride: 98 mmol/L (ref 98–111)
Creatinine, Ser: 0.82 mg/dL (ref 0.44–1.00)
GFR calc Af Amer: >60 mL/min (ref >60)
GFR calc non Af Amer: >60 mL/min (ref >60)
Glucose, Bld: 153 mg/dL — ABNORMAL HIGH (ref 70–99)
Potassium: 3.5 mmol/L (ref 3.5–5.1)
Sodium: 140 mmol/L (ref 135–145)
Total Bilirubin: 0.6 mg/dL (ref 0.3–1.2)
Total Protein: 8.2 g/dL — ABNORMAL HIGH (ref 6.5–8.1)

## 2019-02-13 LAB — URINALYSIS, ROUTINE W REFLEX MICROSCOPIC
Bilirubin Urine: NEGATIVE
Glucose, UA: NEGATIVE mg/dL
Ketones, ur: 20 mg/dL — AB
Leukocytes,Ua: NEGATIVE
Nitrite: NEGATIVE
Protein, ur: 100 mg/dL — AB
Specific Gravity, Urine: 1.024 (ref 1.005–1.030)
pH: 5 (ref 5.0–8.0)

## 2019-02-13 LAB — CBC
HCT: 43 % (ref 36.0–46.0)
Hemoglobin: 14 g/dL (ref 12.0–15.0)
MCH: 28.7 pg (ref 26.0–34.0)
MCHC: 32.6 g/dL (ref 30.0–36.0)
MCV: 88.3 fL (ref 80.0–100.0)
Platelets: 339 10*3/uL (ref 150–400)
RBC: 4.87 MIL/uL (ref 3.87–5.11)
RDW: 13.8 % (ref 11.5–15.5)
WBC: 12.1 10*3/uL — ABNORMAL HIGH (ref 4.0–10.5)
nRBC: 0 % (ref 0.0–0.2)

## 2019-02-13 LAB — LIPASE, BLOOD: Lipase: 45 U/L (ref 11–51)

## 2019-02-13 MED ORDER — ONDANSETRON 4 MG PO TBDP
4.0000 mg | ORAL_TABLET | Freq: Once | ORAL | Status: AC | PRN
  Administered 2019-02-13: 4 mg via ORAL
  Filled 2019-02-13: qty 1

## 2019-02-13 MED ORDER — SODIUM CHLORIDE 0.9 % IV BOLUS
1000.0000 mL | Freq: Once | INTRAVENOUS | Status: AC
  Administered 2019-02-14: 1000 mL via INTRAVENOUS

## 2019-02-13 MED ORDER — IOHEXOL 300 MG/ML  SOLN
100.0000 mL | Freq: Once | INTRAMUSCULAR | Status: AC | PRN
  Administered 2019-02-13: 100 mL via INTRAVENOUS

## 2019-02-13 MED ORDER — SODIUM CHLORIDE 0.9% FLUSH: 3.0000 mL | Freq: Once | INTRAVENOUS | Status: DC

## 2019-02-13 NOTE — ED Triage Notes (Signed)
Pt c/o LLQ abdominal pain with vomiting since 0700. She is unable to keep any medications down and has intermittent chills. Denies fever.

## 2019-02-13 NOTE — ED Notes (Signed)
Pt given warm blanket.
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
## 2019-02-14 ENCOUNTER — Emergency Department (HOSPITAL_COMMUNITY): Payer: Medicare Other | Admitting: Anesthesiology

## 2019-02-14 ENCOUNTER — Encounter (HOSPITAL_COMMUNITY): Admission: EM | Disposition: A | Discharge status: Home / Self Care | Payer: Self-pay | Source: Home / Self Care

## 2019-02-14 ENCOUNTER — Other Ambulatory Visit: Payer: Self-pay

## 2019-02-14 DIAGNOSIS — Z825 Family history of asthma and other chronic lower respiratory diseases: Secondary | ICD-10-CM | POA: Diagnosis not present

## 2019-02-14 DIAGNOSIS — R109 Unspecified abdominal pain: Secondary | ICD-10-CM | POA: Diagnosis present

## 2019-02-14 DIAGNOSIS — Z86711 Personal history of pulmonary embolism: Secondary | ICD-10-CM | POA: Diagnosis not present

## 2019-02-14 DIAGNOSIS — Z79899 Other long term (current) drug therapy: Secondary | ICD-10-CM | POA: Diagnosis not present

## 2019-02-14 DIAGNOSIS — J45909 Unspecified asthma, uncomplicated: Secondary | ICD-10-CM | POA: Diagnosis present

## 2019-02-14 DIAGNOSIS — K430 Incisional hernia with obstruction, without gangrene: Secondary | ICD-10-CM | POA: Diagnosis present

## 2019-02-14 DIAGNOSIS — Z20828 Contact with and (suspected) exposure to other viral communicable diseases: Secondary | ICD-10-CM | POA: Diagnosis present

## 2019-02-14 DIAGNOSIS — Z886 Allergy status to analgesic agent status: Secondary | ICD-10-CM | POA: Diagnosis not present

## 2019-02-14 DIAGNOSIS — K660 Peritoneal adhesions (postprocedural) (postinfection): Secondary | ICD-10-CM | POA: Diagnosis present

## 2019-02-14 DIAGNOSIS — K219 Gastro-esophageal reflux disease without esophagitis: Secondary | ICD-10-CM | POA: Diagnosis present

## 2019-02-14 DIAGNOSIS — H409 Unspecified glaucoma: Secondary | ICD-10-CM | POA: Diagnosis present

## 2019-02-14 DIAGNOSIS — Z885 Allergy status to narcotic agent status: Secondary | ICD-10-CM | POA: Diagnosis not present

## 2019-02-14 DIAGNOSIS — Z23 Encounter for immunization: Secondary | ICD-10-CM | POA: Diagnosis present

## 2019-02-14 DIAGNOSIS — Z8719 Personal history of other diseases of the digestive system: Secondary | ICD-10-CM | POA: Diagnosis not present

## 2019-02-14 DIAGNOSIS — E041 Nontoxic single thyroid nodule: Secondary | ICD-10-CM | POA: Diagnosis present

## 2019-02-14 DIAGNOSIS — M1990 Unspecified osteoarthritis, unspecified site: Secondary | ICD-10-CM | POA: Diagnosis present

## 2019-02-14 DIAGNOSIS — E785 Hyperlipidemia, unspecified: Secondary | ICD-10-CM | POA: Diagnosis present

## 2019-02-14 DIAGNOSIS — Z7951 Long term (current) use of inhaled steroids: Secondary | ICD-10-CM | POA: Diagnosis not present

## 2019-02-14 DIAGNOSIS — I10 Essential (primary) hypertension: Secondary | ICD-10-CM | POA: Diagnosis present

## 2019-02-14 DIAGNOSIS — E876 Hypokalemia: Secondary | ICD-10-CM | POA: Diagnosis present

## 2019-02-14 HISTORY — PX: VENTRAL HERNIA REPAIR: SHX424

## 2019-02-14 LAB — SARS CORONAVIRUS 2 (TAT 6-24 HRS): SARS Coronavirus 2: NEGATIVE

## 2019-02-14 SURGERY — REPAIR, HERNIA, VENTRAL
Anesthesia: General | Site: Abdomen

## 2019-02-14 MED ORDER — PROMETHAZINE HCL 25 MG/ML IJ SOLN
INTRAMUSCULAR | Status: AC
  Filled 2019-02-14: qty 1

## 2019-02-14 MED ORDER — MORPHINE SULFATE (PF) 2 MG/ML IV SOLN
2.0000 mg | INTRAVENOUS | Status: DC | PRN
  Filled 2019-02-14: qty 1

## 2019-02-14 MED ORDER — LACTATED RINGERS IV SOLN
INTRAVENOUS | Status: DC | PRN
  Administered 2019-02-14 (×2): via INTRAVENOUS

## 2019-02-14 MED ORDER — ENOXAPARIN SODIUM 40 MG/0.4ML ~~LOC~~ SOLN
40.0000 mg | SUBCUTANEOUS | Status: DC
  Administered 2019-02-14 – 2019-02-15 (×2): 40 mg via SUBCUTANEOUS
  Filled 2019-02-14 (×2): qty 0.4

## 2019-02-14 MED ORDER — ONDANSETRON HCL 4 MG/2ML IJ SOLN: 4.0000 mg | Freq: Four times a day (QID) | INTRAMUSCULAR | Status: DC | PRN

## 2019-02-14 MED ORDER — FLUTICASONE PROPIONATE 50 MCG/ACT NA SUSP
1.0000 | Freq: Every day | NASAL | Status: DC
  Administered 2019-02-14 – 2019-02-16 (×3): 1 via NASAL
  Filled 2019-02-14: qty 16

## 2019-02-14 MED ORDER — MEPERIDINE HCL 25 MG/ML IJ SOLN: 6.2500 mg | INTRAMUSCULAR | Status: DC | PRN

## 2019-02-14 MED ORDER — CEFAZOLIN SODIUM-DEXTROSE 2-4 GM/100ML-% IV SOLN
2.0000 g | INTRAVENOUS | Status: AC
  Administered 2019-02-14: 2 g via INTRAVENOUS
  Filled 2019-02-14: qty 100

## 2019-02-14 MED ORDER — POTASSIUM CHLORIDE IN NACL 20-0.9 MEQ/L-% IV SOLN
INTRAVENOUS | Status: DC
  Administered 2019-02-14 (×2): via INTRAVENOUS
  Filled 2019-02-14 (×3): qty 1000

## 2019-02-14 MED ORDER — HYDROMORPHONE HCL 1 MG/ML IJ SOLN: 0.2500 mg | INTRAMUSCULAR | Status: DC | PRN

## 2019-02-14 MED ORDER — ACETAMINOPHEN 325 MG PO TABS: 650.0000 mg | ORAL_TABLET | Freq: Four times a day (QID) | ORAL | Status: DC | PRN

## 2019-02-14 MED ORDER — PROPOFOL 10 MG/ML IV BOLUS
INTRAVENOUS | Status: DC | PRN
  Administered 2019-02-14: 160 mg via INTRAVENOUS

## 2019-02-14 MED ORDER — FENTANYL CITRATE (PF) 250 MCG/5ML IJ SOLN
INTRAMUSCULAR | Status: DC | PRN
  Administered 2019-02-14 (×4): 50 ug via INTRAVENOUS

## 2019-02-14 MED ORDER — SODIUM CHLORIDE 0.9 % IV SOLN
INTRAVENOUS | Status: DC | PRN
  Administered 2019-02-14: 25 ug/min via INTRAVENOUS

## 2019-02-14 MED ORDER — ONDANSETRON HCL 4 MG/2ML IJ SOLN
INTRAMUSCULAR | Status: DC | PRN
  Administered 2019-02-14: 4 mg via INTRAVENOUS

## 2019-02-14 MED ORDER — ROCURONIUM BROMIDE 10 MG/ML (PF) SYRINGE
PREFILLED_SYRINGE | INTRAVENOUS | Status: AC
  Filled 2019-02-14: qty 10

## 2019-02-14 MED ORDER — METHOCARBAMOL 500 MG PO TABS: 500.0000 mg | ORAL_TABLET | Freq: Four times a day (QID) | ORAL | Status: DC | PRN

## 2019-02-14 MED ORDER — FENTANYL CITRATE (PF) 250 MCG/5ML IJ SOLN
INTRAMUSCULAR | Status: AC
  Filled 2019-02-14: qty 5

## 2019-02-14 MED ORDER — SUCCINYLCHOLINE CHLORIDE 200 MG/10ML IV SOSY
PREFILLED_SYRINGE | INTRAVENOUS | Status: DC | PRN
  Administered 2019-02-14: 160 mg via INTRAVENOUS

## 2019-02-14 MED ORDER — BUPIVACAINE-EPINEPHRINE (PF) 0.25% -1:200000 IJ SOLN
INTRAMUSCULAR | Status: AC
  Filled 2019-02-14: qty 30

## 2019-02-14 MED ORDER — PANTOPRAZOLE SODIUM 40 MG IV SOLR
40.0000 mg | Freq: Every day | INTRAVENOUS | Status: DC
  Administered 2019-02-14 – 2019-02-15 (×2): 40 mg via INTRAVENOUS
  Filled 2019-02-14 (×2): qty 40

## 2019-02-14 MED ORDER — PROPOFOL 10 MG/ML IV BOLUS
INTRAVENOUS | Status: AC
  Filled 2019-02-14: qty 20

## 2019-02-14 MED ORDER — LIDOCAINE 2% (20 MG/ML) 5 ML SYRINGE
INTRAMUSCULAR | Status: AC
  Filled 2019-02-14: qty 5

## 2019-02-14 MED ORDER — ROCURONIUM BROMIDE 10 MG/ML (PF) SYRINGE
PREFILLED_SYRINGE | INTRAVENOUS | Status: DC | PRN
  Administered 2019-02-14: 10 mg via INTRAVENOUS
  Administered 2019-02-14: 50 mg via INTRAVENOUS

## 2019-02-14 MED ORDER — ALBUTEROL SULFATE (2.5 MG/3ML) 0.083% IN NEBU: 3.0000 mL | INHALATION_SOLUTION | Freq: Four times a day (QID) | RESPIRATORY_TRACT | Status: DC | PRN

## 2019-02-14 MED ORDER — ACETAMINOPHEN 650 MG RE SUPP: 650.0000 mg | Freq: Four times a day (QID) | RECTAL | Status: DC | PRN

## 2019-02-14 MED ORDER — HYDROMORPHONE HCL 1 MG/ML IJ SOLN
0.5000 mg | INTRAMUSCULAR | Status: DC | PRN
  Administered 2019-02-14: 12:00:00 1 mg via INTRAVENOUS
  Administered 2019-02-14: 2 mg via INTRAVENOUS
  Administered 2019-02-14 – 2019-02-15 (×2): 1 mg via INTRAVENOUS
  Filled 2019-02-14 (×3): qty 1

## 2019-02-14 MED ORDER — PHENOL 1.4 % MT LIQD
1.0000 | OROMUCOSAL | Status: DC | PRN
  Administered 2019-02-14: 1 via OROMUCOSAL
  Filled 2019-02-14: qty 177

## 2019-02-14 MED ORDER — MIDAZOLAM HCL 2 MG/2ML IJ SOLN
INTRAMUSCULAR | Status: AC
  Filled 2019-02-14: qty 2

## 2019-02-14 MED ORDER — PROMETHAZINE HCL 25 MG/ML IJ SOLN
6.2500 mg | INTRAMUSCULAR | Status: DC | PRN
  Administered 2019-02-14: 10:00:00 6.25 mg via INTRAVENOUS

## 2019-02-14 MED ORDER — METOPROLOL TARTRATE 5 MG/5ML IV SOLN: 5.0000 mg | Freq: Four times a day (QID) | INTRAVENOUS | Status: DC | PRN

## 2019-02-14 MED ORDER — CHLORHEXIDINE GLUCONATE CLOTH 2 % EX PADS
6.0000 | MEDICATED_PAD | Freq: Every day | CUTANEOUS | Status: DC
  Administered 2019-02-14 – 2019-02-16 (×3): 6 via TOPICAL

## 2019-02-14 MED ORDER — SUGAMMADEX SODIUM 200 MG/2ML IV SOLN
INTRAVENOUS | Status: DC | PRN
  Administered 2019-02-14: 200 mg via INTRAVENOUS

## 2019-02-14 MED ORDER — SUCCINYLCHOLINE CHLORIDE 200 MG/10ML IV SOSY
PREFILLED_SYRINGE | INTRAVENOUS | Status: AC
  Filled 2019-02-14: qty 10

## 2019-02-14 MED ORDER — DIPHENHYDRAMINE HCL 12.5 MG/5ML PO ELIX: 12.5000 mg | ORAL_SOLUTION | Freq: Four times a day (QID) | ORAL | Status: DC | PRN

## 2019-02-14 MED ORDER — 0.9 % SODIUM CHLORIDE (POUR BTL) OPTIME
TOPICAL | Status: DC | PRN
  Administered 2019-02-14: 2000 mL

## 2019-02-14 MED ORDER — DIPHENHYDRAMINE HCL 50 MG/ML IJ SOLN: 12.5000 mg | Freq: Four times a day (QID) | INTRAMUSCULAR | Status: DC | PRN

## 2019-02-14 MED ORDER — DEXAMETHASONE SODIUM PHOSPHATE 10 MG/ML IJ SOLN
INTRAMUSCULAR | Status: DC | PRN
  Administered 2019-02-14: 4 mg via INTRAVENOUS

## 2019-02-14 MED ORDER — DORZOLAMIDE HCL-TIMOLOL MAL 2-0.5 % OP SOLN
1.0000 [drp] | Freq: Two times a day (BID) | OPHTHALMIC | Status: DC
  Administered 2019-02-14 – 2019-02-16 (×5): 1 [drp] via OPHTHALMIC
  Filled 2019-02-14: qty 10

## 2019-02-14 MED ORDER — LACTATED RINGERS IV BOLUS
1000.0000 mL | Freq: Once | INTRAVENOUS | Status: AC
  Administered 2019-02-14: 05:00:00 1000 mL via INTRAVENOUS

## 2019-02-14 MED ORDER — DOCUSATE SODIUM 100 MG PO CAPS
100.0000 mg | ORAL_CAPSULE | Freq: Two times a day (BID) | ORAL | Status: DC
  Administered 2019-02-14 – 2019-02-16 (×4): 100 mg via ORAL
  Filled 2019-02-14 (×5): qty 1

## 2019-02-14 MED ORDER — LIDOCAINE HCL 1 % IJ SOLN
INTRAMUSCULAR | Status: AC
  Filled 2019-02-14: qty 20

## 2019-02-14 MED ORDER — ONDANSETRON 4 MG PO TBDP: 4.0000 mg | ORAL_TABLET | Freq: Four times a day (QID) | ORAL | Status: DC | PRN

## 2019-02-14 MED ORDER — HYDROMORPHONE HCL 1 MG/ML IJ SOLN
INTRAMUSCULAR | Status: AC
  Filled 2019-02-14: qty 2

## 2019-02-14 SURGICAL SUPPLY — 34 items
APL PRP STRL LF DISP 70% ISPRP (MISCELLANEOUS) ×1
BLADE CLIPPER SURG (BLADE) IMPLANT
CANISTER SUCT 3000ML PPV (MISCELLANEOUS) ×2 IMPLANT
CHLORAPREP W/TINT 26 (MISCELLANEOUS) ×2 IMPLANT
COVER SURGICAL LIGHT HANDLE (MISCELLANEOUS) ×2 IMPLANT
COVER WAND RF STERILE (DRAPES) ×2 IMPLANT
DRAPE LAPAROSCOPIC ABDOMINAL (DRAPES) ×1 IMPLANT
DRSG COVADERM 4X8 (GAUZE/BANDAGES/DRESSINGS) ×1 IMPLANT
ELECT REM PT RETURN 9FT ADLT (ELECTROSURGICAL) ×2
ELECTRODE REM PT RTRN 9FT ADLT (ELECTROSURGICAL) ×1 IMPLANT
GLOVE BIO SURGEON STRL SZ 6 (GLOVE) ×2 IMPLANT
GLOVE INDICATOR 6.5 STRL GRN (GLOVE) ×2 IMPLANT
GOWN STRL REUS W/ TWL LRG LVL3 (GOWN DISPOSABLE) ×2 IMPLANT
GOWN STRL REUS W/TWL 2XL LVL3 (GOWN DISPOSABLE) ×2 IMPLANT
GOWN STRL REUS W/TWL LRG LVL3 (GOWN DISPOSABLE) ×4
HANDLE SUCTION POOLE (INSTRUMENTS) IMPLANT
KIT BASIN OR (CUSTOM PROCEDURE TRAY) ×2 IMPLANT
KIT TURNOVER KIT B (KITS) ×2 IMPLANT
NDL HYPO 25GX1X1/2 BEV (NEEDLE) ×1 IMPLANT
NEEDLE HYPO 25GX1X1/2 BEV (NEEDLE) ×2 IMPLANT
NS IRRIG 1000ML POUR BTL (IV SOLUTION) ×2 IMPLANT
PACK GENERAL/GYN (CUSTOM PROCEDURE TRAY) ×2 IMPLANT
PAD ARMBOARD 7.5X6 YLW CONV (MISCELLANEOUS) ×4 IMPLANT
PENCIL SMOKE EVACUATOR (MISCELLANEOUS) ×2 IMPLANT
SPONGE LAP 18X18 RF (DISPOSABLE) ×1 IMPLANT
STAPLER VISISTAT 35W (STAPLE) ×1 IMPLANT
SUCTION POOLE HANDLE (INSTRUMENTS) ×2
SUT MNCRL AB 4-0 PS2 18 (SUTURE) ×1 IMPLANT
SUT NOVA 1 T20/GS 25DT (SUTURE) ×3 IMPLANT
SUT NOVA NAB DX-16 0-1 5-0 T12 (SUTURE) ×4 IMPLANT
SUT VIC AB 3-0 SH 18 (SUTURE) ×1 IMPLANT
TOWEL GREEN STERILE (TOWEL DISPOSABLE) ×2 IMPLANT
TOWEL GREEN STERILE FF (TOWEL DISPOSABLE) ×2 IMPLANT
TRAY FOLEY MTR SLVR 14FR STAT (SET/KITS/TRAYS/PACK) ×1 IMPLANT

## 2019-02-14 NOTE — Transfer of Care (Signed)
Immediate Anesthesia Transfer of Care Note  Patient: Rhonda [name]  Procedure(s) Performed: OPEN HERNIA REPAIR VENTRAL ADULT (N/A Abdomen)  Patient Location: PACU  Anesthesia Type:General  Level of Consciousness: awake, alert  and oriented  Airway & Oxygen Therapy: Patient Spontanous Breathing and Patient connected to nasal cannula oxygen  Post-op Assessment: Report given to RN and Post -op Vital signs reviewed and stable  Post vital signs: Reviewed and stable  Last Vitals:  Vitals Value Taken Time  BP    Temp    Pulse 78 02/14/19 0822  Resp 11 02/14/19 0822  SpO2 100 % 02/14/19 0822  Vitals shown include unvalidated device data.  Last Pain:  Vitals:   02/14/19 0225  TempSrc:   PainSc: 4          Complications: No apparent anesthesia complications

## 2019-02-14 NOTE — Anesthesia Postprocedure Evaluation (Signed)
Anesthesia Post Note  Patient: Rhonda [name]  Procedure(s) Performed: OPEN HERNIA REPAIR VENTRAL ADULT (N/A Abdomen)     Patient location during evaluation: PACU Anesthesia Type: General Level of consciousness: awake and alert and oriented Pain management: pain level controlled Vital Signs Assessment: post-procedure vital signs reviewed and stable Respiratory status: spontaneous breathing, nonlabored ventilation and respiratory function stable Cardiovascular status: blood pressure returned to baseline and stable Postop Assessment: no apparent nausea or vomiting Anesthetic complications: no    Last Vitals:  Vitals:   02/14/19 0930 02/14/19 0945  BP: (!) 167/74   Pulse:    Resp:    Temp:  (!) 36.3 C  SpO2:  100%    Last Pain:  Vitals:   02/14/19 0945  TempSrc:   PainSc: Asleep                 Grigor Lipschutz A.

## 2019-02-14 NOTE — Anesthesia Preprocedure Evaluation (Signed)
Anesthesia Evaluation  Patient identified by MRN, date of birth, ID band Patient awake    Reviewed: Allergy & Precautions, NPO status , Patient's Chart, lab work & pertinent test results  History of Anesthesia Complications (+) history of anesthetic complications  Airway Mallampati: II  TM Distance: >3 FB Neck ROM: Full    Dental  (+) Poor Dentition, Missing   Pulmonary neg pulmonary ROS,    Pulmonary exam normal breath sounds clear to auscultation       Cardiovascular hypertension, Pt. on medications Normal cardiovascular exam Rhythm:Regular Rate:Normal  Echo 2018 - Left ventricle: The cavity size was normal. Wall thickness was normal. Systolic function was normal. The estimated ejection fraction was in the range of 60% to 65%. Wall motion was normal; there were no regional wall motion abnormalities. Doppler parameters are consistent with abnormal left ventricular relaxation (grade 1 diastolic dysfunction). - Aortic valve: There was no stenosis. - Mitral valve: There was no significant regurgitation. - Right ventricle: The cavity size was normal. Systolic function was normal. - Tricuspid valve: Peak RV-RA gradient (S): 34 mm Hg. - Systemic veins: IVC was not visualized.  Impressions: - Normal LV size with EF 60-65%. Normal RV size and systolic function. No significant valvular abnormalities. Mild pulmonary  hypertension.   Neuro/Psych  Headaches,    GI/Hepatic Neg liver ROS, GERD  Medicated,CT Abdomen: mesenteric volvulus and obstruction of herniated small bowel loops at the level of a moderate to large anterior abdominal wall hernia.    Endo/Other  Morbid obesity  Renal/GU negative Renal ROS     Musculoskeletal  (+) Arthritis ,   Abdominal   Peds  Hematology negative hematology ROS (+) anemia ,   Anesthesia Other Findings Day of surgery medications reviewed with the patient.  Reproductive/Obstetrics                              Anesthesia Physical  Anesthesia Plan  ASA: III and emergent  Anesthesia Plan: General   Post-op Pain Management:    Induction: Intravenous and Rapid sequence  PONV Risk Score and Plan: 4 or greater and Midazolam, Dexamethasone and Ondansetron  Airway Management Planned: Oral ETT  Additional Equipment:   Intra-op Plan:   Post-operative Plan: Possible Post-op intubation/ventilation  Informed Consent: I have reviewed the patients History and Physical, chart, labs and discussed the procedure including the risks, benefits and alternatives for the proposed anesthesia with the patient or authorized representative who has indicated his/her understanding and acceptance.     Dental advisory given  Plan Discussed with: CRNA  Anesthesia Plan Comments:         Anesthesia Quick Evaluation

## 2019-02-14 NOTE — Progress Notes (Signed)
Patient admitted to unit from PACU, VSS. Patient settled in bed and oriented to unit and hospital routine. Patient has abdominal incision clean, intact with scant drainage present (previously marked prior to transport to floor). Pt resting comfortably in bed with call bell in reach and side rails up. Instructed patient to utilize call bell for assistance. Will continue to monitor closely for remainder of shift.
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
## 2019-02-14 NOTE — Anesthesia Procedure Notes (Signed)
Procedure Name: Intubation Date/Time: 02/14/2019 6:31 AM Performed by: Valetta Fuller, CRNA Pre-anesthesia Checklist: Patient identified, Emergency Drugs available, Suction available, Patient being monitored and Timeout performed Patient Re-evaluated:Patient Re-evaluated prior to induction Oxygen Delivery Method: Circle system utilized Preoxygenation: Pre-oxygenation with 100% oxygen Induction Type: IV induction, Rapid sequence and Cricoid Pressure applied Laryngoscope Size: Miller and 2 Grade View: Grade I Tube type: Oral Tube size: 7.5 mm Number of attempts: 1 Airway Equipment and Method: Stylet Placement Confirmation: ETT inserted through vocal cords under direct vision,  positive ETCO2,  CO2 detector and breath sounds checked- equal and bilateral Secured at: 21 cm Tube secured with: Tape Dental Injury: Teeth and Oropharynx as per pre-operative assessment

## 2019-02-14 NOTE — Op Note (Signed)
Repair of recurrent incarcerated ventral incisional hernia and lysis of adhesions.    Indications: Pt presented to the hospital with findings of bowel obstruction and incarcerated ventral hernia.   Pre-operative Diagnosis: incarcerated ventral hernia with small bowel.    Post-operative Diagnosis: Ventral hernia with small bowel  Surgeon: Stark Klein   Assistants: Verita Lamb, MD  Anesthesia: General endotracheal anesthesia  ASA Class: 3, E  Procedure Details  The patient was seen in the Holding Room. The risks, benefits, complications, treatment options, and expected outcomes were discussed with the patient. The possibilities of reaction to medication, pulmonary aspiration, perforation of viscus, bleeding, recurrent infection, the need for additional procedures, failure to diagnose a condition, and creating a complication requiring transfusion or operation were discussed with the patient. The patient concurred with the proposed plan, giving informed consent.  The site of surgery properly noted/marked. The patient was taken to the operating room, identified as Rhonda [name] and the procedure verified as ventral hernia repair. A Time Out was held and the above information confirmed.  The patient was placed supine.  After establishing general anesthesia, the abdomen was prepped with Chloraprep and draped in sterile fashion.  We made a vertical incision over the palpable hernia. Dissection was carried down to the hernia sac located above the fascia and mobilized from surrounding structures. The hernia sac was opened.   There were significant intraabdominal adhesions to the mesh.  These were divided sharply.  Intact mesh/fascia was identified circumferentially around the defect.  The abdomen was irrigated.  The fascia/mesh conglomerate was closed with interrupted #1 novofil sutures, taking care to avoid the underlying viscera.  The subcutaneous tissues were irrigated.  The skin incision was  closed with skin staples.  Hemostasis was confirmed.  The skin incision was closed in layers with a 4-0 Vicryl subcuticular closure.  Steri-Strips were applied at the end of the operation.    Instrument, sponge, and needle counts were correct prior to closure and at the conclusion of the case.   Findings: Many adhesions and incarcerated small bowel.  No evidence of bowel compromise  Estimated Blood Loss:  Minimal         Drains: none                      Complications:  None; patient tolerated the procedure well.         Disposition: PACU - hemodynamically stable.         Condition: stable

## 2019-02-15 ENCOUNTER — Encounter (HOSPITAL_COMMUNITY): Payer: Self-pay | Admitting: General Surgery

## 2019-02-15 LAB — BASIC METABOLIC PANEL
Anion gap: 8 (ref 5–15)
BUN: 10 mg/dL (ref 8–23)
CO2: 27 mmol/L (ref 22–32)
Calcium: 7.8 mg/dL — ABNORMAL LOW (ref 8.9–10.3)
Chloride: 106 mmol/L (ref 98–111)
Creatinine, Ser: 0.76 mg/dL (ref 0.44–1.00)
GFR calc Af Amer: >60 mL/min (ref >60)
GFR calc non Af Amer: >60 mL/min (ref >60)
Glucose, Bld: 115 mg/dL — ABNORMAL HIGH (ref 70–99)
Potassium: 3.1 mmol/L — ABNORMAL LOW (ref 3.5–5.1)
Sodium: 141 mmol/L (ref 135–145)

## 2019-02-15 LAB — SURGICAL PATHOLOGY

## 2019-02-15 LAB — CBC
HCT: 34.4 % — ABNORMAL LOW (ref 36.0–46.0)
Hemoglobin: 11.4 g/dL — ABNORMAL LOW (ref 12.0–15.0)
MCH: 29.2 pg (ref 26.0–34.0)
MCHC: 33.1 g/dL (ref 30.0–36.0)
MCV: 88.2 fL (ref 80.0–100.0)
Platelets: 284 10*3/uL (ref 150–400)
RBC: 3.9 MIL/uL (ref 3.87–5.11)
RDW: 14.4 % (ref 11.5–15.5)
WBC: 9.9 10*3/uL (ref 4.0–10.5)
nRBC: 0 % (ref 0.0–0.2)

## 2019-02-15 MED ORDER — METHOCARBAMOL 500 MG PO TABS
500.0000 mg | ORAL_TABLET | Freq: Three times a day (TID) | ORAL | Status: DC
  Administered 2019-02-15 – 2019-02-16 (×4): 500 mg via ORAL
  Filled 2019-02-15 (×4): qty 1

## 2019-02-15 MED ORDER — INFLUENZA VAC A&B SA ADJ QUAD 0.5 ML IM PRSY
0.5000 mL | PREFILLED_SYRINGE | INTRAMUSCULAR | Status: AC
  Administered 2019-02-16: 0.5 mL via INTRAMUSCULAR
  Filled 2019-02-15 (×2): qty 0.5

## 2019-02-15 MED ORDER — HYDROCODONE-ACETAMINOPHEN 5-325 MG PO TABS
1.0000 | ORAL_TABLET | ORAL | Status: DC | PRN
  Administered 2019-02-15: 21:00:00 1 via ORAL
  Filled 2019-02-15: qty 1

## 2019-02-15 NOTE — Progress Notes (Signed)
[facility]/Trauma Progress Note  1 Day Post-Op   Assessment/Plan  GERD - protonix  HTN HLD Obesity Hx of PE - on lovenox Hx of ventral hernia repair 06/2016 by Dr. Derrell Lolling Hx of Ex lap LOA 08/2017, Dr. Sheliah Hatch  Recurrent incarcerated ventral incisional hernia - S/P repair of recurrent incarcerated ventral incisional hernia and LOA, Dr. Donell Beers, 10/15 - DC NGT, CLD, ambulate  FEN: CLD, IVF @ 50cc/hr VTE: SCD's, lovenox ID: preop abx only, afebrile, WBC 9.9 Foley: DC's this am Follow up: Dr. Donell Beers  DISPO: DC NGT, CLD, ambulate, PT, hopefully home tomorrow    LOS: 1 day    Subjective: CC: abdominal soreness  Soreness at incision site. Intermittent sharp, short lived, abdominal pains that seem to be associated with abdominal grumbles. No flatus yet. Sore throat from NGT. Pt lives with her 108 yo mother and is her main care taker.   Objective: Vital signs in last 24 hours: Temp:  [97.3 F (36.3 C)-98.6 F (37 C)] 98.3 F (36.8 C) (10/16 0620) Pulse Rate:  [71-90] 86 (10/16 0620) Resp:  [14-18] 18 (10/16 0620) BP: (123-172)/(65-86) 139/65 (10/16 0620) SpO2:  [98 %-100 %] 99 % (10/16 0620) Last BM Date: 02/13/19  Intake/Output from previous day: 10/15 0701 - 10/16 0700 In: 2932.8 [I.V.:2932.8] Out: 1170 [Urine:850; Emesis/NG output:270; Blood:50] Intake/Output this shift: No intake/output data recorded.  PE: Gen:  Alert, NAD, pleasant, cooperative Card:  RRR, no M/G/R heard Pulm:  CTA, no W/R/R, effort normal Abd: Soft, obese, ND, +BS, incisions C/D/I, generalized TTP with worse TTP around incision. No peritonitis  Skin: no rashes noted, warm and dry   Anti-infectives: Anti-infectives (From admission, onward)   Start     Dose/Rate Route Frequency Ordered Stop   02/14/19 0600  ceFAZolin (ANCEF) IVPB 2g/100 mL premix     2 g 200 mL/hr over 30 Minutes Intravenous On call to O.R. 02/14/19 0456 02/14/19 0543      Lab Results:  Recent Labs   02/13/19 1534 02/15/19 0632  WBC 12.1* 9.9  HGB 14.0 11.4*  HCT 43.0 34.4*  PLT 339 284   BMET Recent Labs    02/13/19 1534 02/15/19 0632  NA 140 141  K 3.5 3.1*  CL 98 106  CO2 26 27  GLUCOSE 153* 115*  BUN 14 10  CREATININE 0.82 0.76  CALCIUM 10.1 7.8*   PT/INR No results for input(s): LABPROT, INR in the last 72 hours. CMP     Component Value Date/Time   NA 141 02/15/2019 0632   NA 140 01/10/2019 1025   K 3.1 (L) 02/15/2019 0632   CL 106 02/15/2019 0632   CO2 27 02/15/2019 0632   GLUCOSE 115 (H) 02/15/2019 0632   BUN 10 02/15/2019 0632   BUN 11 01/10/2019 1025   CREATININE 0.76 02/15/2019 0632   CALCIUM 7.8 (L) 02/15/2019 0632   PROT 8.2 (H) 02/13/2019 1534   PROT 6.8 01/10/2019 1025   ALBUMIN 4.5 02/13/2019 1534   ALBUMIN 4.6 01/10/2019 1025   AST 20 02/13/2019 1534   ALT 19 02/13/2019 1534   ALKPHOS 63 02/13/2019 1534   BILITOT 0.6 02/13/2019 1534   BILITOT 0.3 01/10/2019 1025   GFRNONAA >60 02/15/2019 0632   GFRAA >60 02/15/2019 0632   Lipase     Component Value Date/Time   LIPASE 45 02/13/2019 1534    Studies/Results: Ct Abdomen Pelvis W Contrast  Result Date: 02/13/2019 CLINICAL DATA:  68 year old female with left lower quadrant abdominal pain and vomiting.  EXAM: CT ABDOMEN AND PELVIS WITH CONTRAST TECHNIQUE: Multidetector CT imaging of the abdomen and pelvis was performed using the standard protocol following bolus administration of intravenous contrast. CONTRAST:  160mL OMNIPAQUE IOHEXOL 300 MG/ML  SOLN COMPARISON:  CT of the abdomen pelvis dated 09/10/2017 FINDINGS: Lower chest: Bibasilar linear atelectasis/scarring. The visualized lung bases are otherwise clear. No intra-abdominal free air. There is a small free fluid within the pelvis. Hepatobiliary: Apparent background of fatty liver. No intrahepatic biliary ductal dilatation. Subcentimeter hypodense focus in the right lobe of the liver (series 3 image 25) is too small to characterize. Small  calcified gallstones. No pericholecystic fluid or evidence of acute cholecystitis by CT. Pancreas: Unremarkable. No pancreatic ductal dilatation or surrounding inflammatory changes. Spleen: Normal in size without focal abnormality. Adrenals/Urinary Tract: The adrenal glands are unremarkable. There is no hydronephrosis on either side. There is symmetric enhancement and excretion of contrast both kidneys. Subcentimeter right renal upper pole hypodense lesion is too small to characterize. The visualized ureters and urinary bladder appear unremarkable. Stomach/Bowel: There is a midline lower abdominal ventral hernia along the surgical incision containing multiple loops of small bowel. There is adhesions of the loops of small bowel to the anterior peritoneal wall and anterior wall of the ventral hernia. There is a focal area of luminal narrowing of a loop of small bowel at the neck of the hernia (series 3 image 63). Other transition is probably present at the neck of the hernia involving another segment of small bowel (series 3, image 59. There is dilatation of proximal and mid small bowel loops with fluid content measuring up to 4.2 cm in diameter. The distal small bowel and terminal ileum demonstrate a normal caliber. There is sigmoid diverticulosis without active inflammatory changes. Scattered colonic diverticula noted. The appendix is normal. There is a small hiatal hernia. Vascular/Lymphatic: Moderate aortoiliac atherosclerotic disease. The IVC is unremarkable. No portal venous gas. There is no adenopathy. Reproductive: Partially calcified uterine fundal fibroid. The ovaries are grossly unremarkable. Other: Midline vertical anterior pelvic wall incisional scar. Musculoskeletal: Degenerative changes of the spine. Lower lumbar posterior fixation. No acute osseous pathology. IMPRESSION: 1. Small-bowel obstruction with probably more than one transition at the neck of the lower abdominal ventral hernia. Additional site  of obstruction may be present within the hernia secondary to adhesions. 2. Fatty liver. 3. Cholelithiasis. 4. Colonic diverticulosis. Aortic Atherosclerosis (ICD10-I70.0). Electronically Signed   By: Anner Crete M.D.   On: 02/13/2019 23:16     Kalman Drape, [facility] Pager [phone] Cristine Polio, & Friday 7:00am - 4:30pm Thursdays 7:00am -11:30am

## 2019-02-15 NOTE — Evaluation (Addendum)
Physical Therapy Evaluation Patient Details Name: Rhonda [name] MRN: [id number] DOB: Aug 16, 1950 Today's Date: 02/15/2019   History of Present Illness  Patient is a 68 y/o female who presents with abdominal pain, N/V. s/p repair of recurrent incarcerated ventral incisional hernia 10/15. PMH includes HTN, HLD, Hx of ventral hernia repair 06/2016 and Ex lap LOA 08/2017  Clinical Impression  Patient presents with pain, generalized weakness, dizziness and impaired mobility s/p above. Pt independent PTA and cares for 56 y/o mother. Reports having good support from brother and daughter at home. Today, pt tolerated bed mobility, transfers and short distance ambulation with Min guard for balance/safety. Limited by weakness and dizziness. Anticipate pt will improve quickly with increased activity. Encouraged walking with nursing a few times today. Education re: bracing, log roll technique, mobility. Will follow acutely to maximize independence and mobility prior to return home. Recommend OT consult.    Follow Up Recommendations No PT follow up;Supervision - Intermittent    Equipment Recommendations  Rolling walker with 5" wheels(if she cannot use her mothers)    Recommendations for Other Services OT consult     Precautions / Restrictions Precautions Precautions: Fall Precaution Comments: NG tube removed during sesison Restrictions Weight Bearing Restrictions: No      Mobility  Bed Mobility Overal bed mobility: Needs Assistance Bed Mobility: Rolling;Sidelying to Sit Rolling: Min guard Sidelying to sit: Min guard;HOB elevated       General bed mobility comments: Cues for log roll technique, heavy use of rail. Increased time and effort.  Transfers Overall transfer level: Needs assistance Equipment used: Rolling walker (2 wheeled) Transfers: Sit to/from Stand Sit to Stand: Min guard         General transfer comment: Min guard for safety. Stood from [employer], transferred to chair  post ambulation.  Ambulation/Gait Ambulation/Gait assistance: Min guard Gait Distance (Feet): 7 Feet Assistive device: Rolling walker (2 wheeled) Gait Pattern/deviations: Step-through pattern;Decreased stride length;Decreased step length - right;Decreased step length - left Gait velocity: decreased   General Gait Details: Slow, mildly unsteady gait using RW for support. + dizziness due to first time being up. + flatus.  Stairs            Wheelchair Mobility    Modified Rankin (Stroke Patients Only)       Balance Overall balance assessment: Needs assistance Sitting-balance support: Feet supported;No upper extremity supported Sitting balance-Leahy Scale: Fair     Standing balance support: During functional activity Standing balance-Leahy Scale: Fair Standing balance comment: Able to stand statically without UE support but prefers uE support for longer distances.                             Pertinent Vitals/Pain Pain Assessment: 0-10 Pain Score: 4  Pain Location: abdomen Pain Descriptors / Indicators: Sore;Discomfort;Grimacing;Guarding Pain Intervention(s): Repositioned;Monitored during session;Limited activity within patient's tolerance    Home Living Family/patient expects to be discharged to:: Private residence Living Arrangements: Parent Available Help at Discharge: Family;Available PRN/intermittently(daughter and brother are good support) Type of Home: House Home Access: Stairs to enter Entrance Stairs-Rails: Doctor, general practice of Steps: 5 Home Layout: Two level;Able to live on main level with bedroom/bathroom Home Equipment: Dan Humphreys - 2 wheels;Bedside commode;Shower seat      Prior Function Level of Independence: Independent         Comments: Cares for 43 y/o mother     Hand Dominance   Dominant Hand: Right    Extremity/Trunk  Assessment   Upper Extremity Assessment Upper Extremity Assessment: Defer to OT evaluation     Lower Extremity Assessment Lower Extremity Assessment: Generalized weakness(but functional >3+/5 throughout)    Cervical / Trunk Assessment Cervical / Trunk Assessment: Normal  Communication   Communication: No difficulties  Cognition Arousal/Alertness: Awake/alert Behavior During Therapy: WFL for tasks assessed/performed Overall Cognitive Status: Within Functional Limits for tasks assessed                                        General Comments General comments (skin integrity, edema, etc.): Instructed pt in bracing using pillow.    Exercises     Assessment/Plan    PT Assessment Patient needs continued PT services  PT Problem List Decreased strength;Decreased mobility;Pain;Decreased balance;Cardiopulmonary status limiting activity;Decreased activity tolerance       PT Treatment Interventions Therapeutic activities;Gait training;Therapeutic exercise;Patient/family education;Balance training;Stair training;Functional mobility training    PT Goals (Current goals can be found in the Care Plan section)  Acute Rehab PT Goals Patient Stated Goal: to get better and go home PT Goal Formulation: With patient Time For Goal Achievement: 03/01/19 Potential to Achieve Goals: Good    Frequency Min 3X/week   Barriers to discharge Decreased caregiver support cares for mother    Co-evaluation               AM-PAC PT "6 Clicks" Mobility  Outcome Measure Help needed turning from your back to your side while in a flat bed without using bedrails?: A Little Help needed moving from lying on your back to sitting on the side of a flat bed without using bedrails?: A Little Help needed moving to and from a bed to a chair (including a wheelchair)?: A Little Help needed standing up from a chair using your arms (e.g., wheelchair or bedside chair)?: A Little Help needed to walk in hospital room?: A Little Help needed climbing 3-5 steps with a railing? : A Little 6 Click  Score: 18    End of Session Equipment Utilized During Treatment: Gait belt Activity Tolerance: Patient tolerated treatment well Patient left: in chair;with call bell/phone within reach Nurse Communication: Mobility status PT Visit Diagnosis: Pain;Difficulty in walking, not elsewhere classified (R26.2);Muscle weakness (generalized) (M62.81) Pain - part of body: (abdomen)    Time: 3810-1751 PT Time Calculation (min) (ACUTE ONLY): 23 min   Charges:   PT Evaluation $PT Eval Moderate Complexity: 1 Mod PT Treatments $Therapeutic Activity: 8-22 mins        Wray Kearns, PT, DPT Acute Rehabilitation Services Pager [phone] Office [place] 02/15/2019, 9:46 AM

## 2019-02-16 LAB — CBC
HCT: 31.9 % — ABNORMAL LOW (ref 36.0–46.0)
Hemoglobin: 10.3 g/dL — ABNORMAL LOW (ref 12.0–15.0)
MCH: 29 pg (ref 26.0–34.0)
MCHC: 32.3 g/dL (ref 30.0–36.0)
MCV: 89.9 fL (ref 80.0–100.0)
Platelets: 279 10*3/uL (ref 150–400)
RBC: 3.55 MIL/uL — ABNORMAL LOW (ref 3.87–5.11)
RDW: 14.4 % (ref 11.5–15.5)
WBC: 9.1 10*3/uL (ref 4.0–10.5)
nRBC: 0 % (ref 0.0–0.2)

## 2019-02-16 LAB — BASIC METABOLIC PANEL
Anion gap: 11 (ref 5–15)
BUN: 9 mg/dL (ref 8–23)
CO2: 24 mmol/L (ref 22–32)
Calcium: 8.1 mg/dL — ABNORMAL LOW (ref 8.9–10.3)
Chloride: 105 mmol/L (ref 98–111)
Creatinine, Ser: 0.69 mg/dL (ref 0.44–1.00)
GFR calc Af Amer: >60 mL/min (ref >60)
GFR calc non Af Amer: >60 mL/min (ref >60)
Glucose, Bld: 101 mg/dL — ABNORMAL HIGH (ref 70–99)
Potassium: 2.8 mmol/L — ABNORMAL LOW (ref 3.5–5.1)
Sodium: 140 mmol/L (ref 135–145)

## 2019-02-16 MED ORDER — POTASSIUM CHLORIDE CRYS ER 20 MEQ PO TBCR
20.0000 meq | EXTENDED_RELEASE_TABLET | Freq: Three times a day (TID) | ORAL | Status: DC
  Administered 2019-02-16: 20 meq via ORAL
  Filled 2019-02-16: qty 1

## 2019-02-16 MED ORDER — HYDROCODONE-ACETAMINOPHEN 5-325 MG PO TABS: 1.0000 | ORAL_TABLET | Freq: Four times a day (QID) | ORAL | 0 refills | Status: DC | PRN

## 2019-02-16 MED ORDER — METHOCARBAMOL 500 MG PO TABS: 500.0000 mg | ORAL_TABLET | Freq: Three times a day (TID) | ORAL | 0 refills | Status: DC | PRN

## 2019-02-16 MED ORDER — POTASSIUM CHLORIDE CRYS ER 20 MEQ PO TBCR: 20.0000 meq | EXTENDED_RELEASE_TABLET | Freq: Two times a day (BID) | ORAL | 0 refills | Status: DC

## 2019-02-16 NOTE — TOC Progression Note (Signed)
Transition of Care [facility]) - Progression Note    Patient Details  Name: Rhonda [name] MRN: [id number] Date of Birth: July 27, 1950  Transition of Care [facility]) CM/SW Contact  Zenon Mayo, RN Phone Number: 02/16/2019, 9:19 AM  Clinical Narrative:    NCM spoke with patient , NCM asked if she needs a rolling walker, she states her mother has one so she will not need one. She has no other needs.     Barriers to Discharge: No Barriers Identified  Expected Discharge Plan and Services           Expected Discharge Date: 02/16/19               DME Arranged: (NA)         HH Arranged: NA           Social Determinants of Health (SDOH) Interventions    Readmission Risk Interventions No flowsheet data found.

## 2019-02-16 NOTE — Discharge Summary (Addendum)
[facility] Surgery/Trauma Discharge Summary   Patient ID: Rhonda [name] MRN: [id number] DOB/AGE: [birth year] 68 y.o.  Admit date: 02/13/2019 Discharge date: 02/16/2019  Admitting Diagnosis: Recurrent incarcerated ventral incisional hernia  Discharge Diagnosis Patient Active Problem List   Diagnosis Date Noted  . Incarcerated incisional hernia 02/14/2019  . Class 3 severe obesity due to excess calories without serious comorbidity in adult ([place]) 01/10/2019  . Non-seasonal allergic rhinitis 01/10/2019  . Decreased estrogen level 01/10/2019  . Plantar fasciitis of right foot 07/10/2018  . Umbilical hernia 31/51/7616  . Volvulus of small intestine ([place]) 09/11/2017  . History of pulmonary embolus (PE) 09/11/2017  . Hypokalemia 09/11/2017  . Hyperglycemia 09/11/2017  . Pulmonary embolism ([place]) 06/21/2016  . S/P hernia repair 06/15/2016  . Tinnitus aurium, bilateral 04/17/2016  . Acute sinusitis 06/12/2015  . Cough 06/12/2015  . History of asthma 06/12/2015    Consultants None  Imaging: No results found.  Procedures Dr. Barry Dienes (02/14/19) - repair of recurrent incarcerated ventral incisional hernia and LOA  HPI: Pt is a 68 yo F who presents with severe pain that started today. She has had 2 days of n/v/d.  For the last 4-6 weeks she has had intermittent episodes of crampy abdominal pain, nausea and vomiting. She has a history of hernia repair in 2018 by Dr. Rosendo Gros and a repair of small bowel volvulus by Dr. Kieth Brightly in 2019.  She feels like this is like the obstruction similar to last surgery.  She denies fever/chills.    She did not have problems with her abdomen since the hernia repair until 4-6 weeks ago.  The pain is more severe now.  It improved temporarily in the ED.   Hospital Course:  Workup showed recurrent incarcerated ventral incisional hernia. patient was admitted and underwent procedure listed above.  Tolerated procedure well and was transferred to  the floor.  NG tube was removed postop day 1 and patient was started on clears.  Diet was advanced as tolerated.  Patient's hypokalemia was replaced p.o.  She will also be sent home on potassium.  On POD#2, the patient was voiding well, tolerating diet, ambulating well, pain well controlled, vital signs stable, incisions c/d/i and felt stable for discharge home.  Patient will follow up as outlined below and knows to call with questions or concerns.     Patient was discharged in good condition.  The [place] Substance controlled database was reviewed prior to prescribing narcotic pain medication to this patient.  Physical Exam: General:  Alert, NAD, pleasant, cooperative Cardio: RRR, S1 & S2 normal, no murmur, rubs, gallops Resp: Effort normal, lungs CTA bilaterally, no wheezes, rales, rhonchi Abd:  Soft, ND, normal bowel sounds, no tenderness, incision with staples intact is well-appearing and without drainage, bleeding, or signs of infection.  No peritonitis Skin: warm and dry, no rashes noted  Allergies as of 02/16/2019      Reactions   Aspirin-acetaminophen-caffeine Nausea And Vomiting   Codeine Diarrhea, Nausea And Vomiting   Stomach cramps   Oxycontin [oxycodone Hcl] Other (See Comments)   Hallucination      Medication List    TAKE these medications   albuterol 108 (90 Base) MCG/ACT inhaler Commonly known as: ProAir HFA Inhale 2 puffs into the lungs every 6 (six) hours as needed for wheezing or shortness of breath.   atorvastatin 10 MG tablet Commonly known as: LIPITOR Take 1 tablet by mouth once daily   dorzolamide-timolol 22.3-6.8 MG/ML ophthalmic solution Commonly known as: COSOPT Place  1 drop into both eyes 2 (two) times daily.   esomeprazole 20 MG capsule Commonly known as: NEXIUM Take 20 mg by mouth daily at 12 noon.   fluticasone 50 MCG/ACT nasal spray Commonly known as: FLONASE USE 1 SPRAY(S) IN EACH NOSTRIL ONCE DAILY What changed: See the new  instructions.   hydrochlorothiazide 25 MG tablet Commonly known as: HYDRODIURIL Take 1 tablet by mouth once daily   HYDROcodone-acetaminophen 5-325 MG tablet Commonly known as: NORCO/VICODIN Take 1 tablet by mouth every 6 (six) hours as needed for moderate pain.   levocetirizine 5 MG tablet Commonly known as: XYZAL TAKE 1 TABLET BY MOUTH ONCE DAILY IN THE EVENING   losartan 100 MG tablet Commonly known as: COZAAR Take 1 tablet by mouth once daily   methocarbamol 500 MG tablet Commonly known as: ROBAXIN Take 1 tablet (500 mg total) by mouth every 8 (eight) hours as needed for muscle spasms.   multivitamin with minerals Tabs tablet Take 1 tablet by mouth daily.   potassium chloride 10 MEQ tablet Commonly known as: KLOR-CON Take 1 tablet by mouth once daily What changed: how much to take   VITAMIN D-3 PO Take 1 capsule by mouth daily.        Follow-up Information    Almond Lint, MD. Schedule an appointment as soon as possible for a visit in 2 week(s).   Specialty: General Surgery Why: for follow up. Call our office monday morning to schedule and appointment for follow up Contact information: [address] [place] [address] [phone]        [place], Nettie Elm, [place]. Schedule an appointment as soon as possible for a visit in 1 week(s).   Specialties: Internal Medicine, Emergency Medicine Why: To have labs checked to check potassium Contact information: [address] [place] [address] [phone]           Signed: Joyce Copa [facility] Surgery 02/16/2019, 9:00 AM Pager: [phone] Consults: [phone] Mon-Fri 7:00 am-4:30 pm Sat-Sun 7:00 am-11:30 am

## 2019-02-16 NOTE — Progress Notes (Signed)
Physical Therapy Treatment Patient Details Name: Rhonda [name] MRN: [id number] DOB: 09-02-1950 Today's Date: 02/16/2019    History of Present Illness Patient is a 68 y/o female who presents with abdominal pain, N/V. s/p repair of recurrent incarcerated ventral incisional hernia 10/15. PMH includes HTN, HLD, Hx of ventral hernia repair 06/2016 and Ex lap LOA 08/2017    PT Comments    Pt making excellent progress since previous session, ambulating an entire lap around the unit with RW. No LOB throughout. No need for any physical assistance. PT will continue to follow pt acutely per PT POC.    Follow Up Recommendations  No PT follow up;Supervision - Intermittent     Equipment Recommendations  Other (comment)(pt reporting she will use her mother's RW)    Recommendations for Other Services       Precautions / Restrictions Precautions Precautions: Fall Restrictions Weight Bearing Restrictions: No    Mobility  Bed Mobility               General bed mobility comments: pt OOB in recliner chair upon arrival  Transfers Overall transfer level: Needs assistance Equipment used: Rolling walker (2 wheeled) Transfers: Sit to/from Stand Sit to Stand: Supervision         General transfer comment: for safety; good technique utilized from chair  Ambulation/Gait Ambulation/Gait assistance: Min Emergency planning/management officer (Feet): 500 Feet Assistive device: Rolling walker (2 wheeled) Gait Pattern/deviations: Step-through pattern;Decreased stride length;Decreased step length - right;Decreased step length - left Gait velocity: decreased   General Gait Details: pt with slow, steady gait with use of RW; pt taking brief standing rest breaks x2    Stairs             Wheelchair Mobility    Modified Rankin (Stroke Patients Only)       Balance Overall balance assessment: Needs assistance Sitting-balance support: Feet supported;No upper extremity supported Sitting  balance-Leahy Scale: Good     Standing balance support: During functional activity Standing balance-Leahy Scale: Fair Standing balance comment: Able to stand statically without UE support but prefers UE support for longer distances.                            Cognition Arousal/Alertness: Awake/alert Behavior During Therapy: WFL for tasks assessed/performed Overall Cognitive Status: Within Functional Limits for tasks assessed                                        Exercises      General Comments        Pertinent Vitals/Pain Pain Assessment: Faces Faces Pain Scale: Hurts a little bit Pain Location: abdomen Pain Descriptors / Indicators: Sore Pain Intervention(s): Monitored during session    Home Living                      Prior Function            PT Goals (current goals can now be found in the care plan section) Acute Rehab PT Goals PT Goal Formulation: With patient Time For Goal Achievement: 03/01/19 Potential to Achieve Goals: Good Progress towards PT goals: Progressing toward goals    Frequency    Min 3X/week      PT Plan Current plan remains appropriate    Co-evaluation  AM-PAC PT "6 Clicks" Mobility   Outcome Measure  Help needed turning from your back to your side while in a flat bed without using bedrails?: A Little Help needed moving from lying on your back to sitting on the side of a flat bed without using bedrails?: A Little Help needed moving to and from a bed to a chair (including a wheelchair)?: None Help needed standing up from a chair using your arms (e.g., wheelchair or bedside chair)?: None Help needed to walk in hospital room?: None Help needed climbing 3-5 steps with a railing? : A Little 6 Click Score: 21    End of Session Equipment Utilized During Treatment: Gait belt Activity Tolerance: Patient tolerated treatment well Patient left: in chair;with call bell/phone within  reach Nurse Communication: Mobility status PT Visit Diagnosis: Pain;Difficulty in walking, not elsewhere classified (R26.2);Muscle weakness (generalized) (M62.81) Pain - part of body: (abdomen)     Time: 0051-1021 PT Time Calculation (min) (ACUTE ONLY): 19 min  Charges:  $Gait Training: 8-22 mins                     Sherie Don, [place], DPT  Acute Rehabilitation Services Pager [phone] Office [place] 02/16/2019, 11:34 AM

## 2019-02-16 NOTE — Discharge Instructions (Signed)
[facility] _______[facility]  [facility] HERNIA REPAIR: POST OP INSTRUCTIONS  Always review your discharge instruction sheet given to you by the facility where your surgery was performed. IF YOU HAVE DISABILITY OR FAMILY LEAVE FORMS, YOU MUST BRING THEM TO THE OFFICE FOR PROCESSING.   DO NOT GIVE THEM TO YOUR DOCTOR.  1. A  prescription for pain medication may be given to you upon discharge.  Take your pain medication as prescribed, if needed.  If narcotic pain medicine is not needed, then you may take acetaminophen (Tylenol) or ibuprofen (Advil) as needed. 2. Take your usually prescribed medications unless otherwise directed. If you need a refill on your pain medication, please contact your pharmacy.  They will contact our office to request authorization. Prescriptions will not be filled after 5 pm or on week-ends. 3. You should follow a light diet the first 24 hours after arrival home, such as soup and crackers, etc.  Be sure to include lots of fluids daily.  Resume your normal diet the day after surgery. 4.Most patients will experience some swelling and bruising around the umbilicus or in the groin and scrotum.  Ice packs and reclining will help.  Swelling and bruising can take several days to resolve.  6. It is common to experience some constipation if taking pain medication after surgery.  Increasing fluid intake and taking a stool softener (such as Colace) will usually help or prevent this problem from occurring.  A mild laxative (Milk of Magnesia or Miralax) should be taken according to package directions if there are no bowel movements after 48 hours. 7. Unless discharge instructions indicate otherwise, you may remove your bandages 24-48 hours after surgery, and you may shower at that time.  You may have steri-strips (small skin tapes) in place directly over the incision.  These strips should be left on the skin for 7-10 days.  If your surgeon used skin glue on the incision, you may  shower in 24 hours.  The glue will flake off over the next 2-3 weeks.  Any sutures or staples will be removed at the office during your follow-up visit. 8. ACTIVITIES:  You may resume regular (light) daily activities beginning the next day--such as daily self-care, walking, climbing stairs--gradually increasing activities as tolerated.  You may have sexual intercourse when it is comfortable.  Refrain from any heavy lifting or straining until approved by your doctor.  a.You may drive when you are no longer taking prescription pain medication, you can comfortably wear a seatbelt, and you can safely maneuver your car and apply brakes. b.RETURN TO WORK:   _____________________________________________  9.You should see your doctor in the office for a follow-up appointment approximately 2-3 weeks after your surgery.  Make sure that you call for this appointment within a day or two after you arrive home to insure a convenient appointment time. 10.OTHER INSTRUCTIONS: _________________________    _____________________________________  WHEN TO CALL YOUR DOCTOR: 1. Fever over 101.0 2. Inability to urinate 3. Nausea and/or vomiting 4. Extreme swelling or bruising 5. Continued bleeding from incision. 6. Increased pain, redness, or drainage from the incision  The clinic staff is available to answer your questions during regular business hours.  Please dont hesitate to call and ask to speak to one of the nurses for clinical concerns.  If you have a medical emergency, go to the nearest emergency room or call 911.  A surgeon from [facility] is always on call at the hospital   [address]  [address] [place], [place], [place]  [address] ?  [address] [place], [place], [place]   [address] [phone] ? [phone] ? FAX [phone] Web site: [email or web address]

## 2019-03-04 ENCOUNTER — Other Ambulatory Visit: Payer: Self-pay | Admitting: Internal Medicine

## 2019-03-12 ENCOUNTER — Other Ambulatory Visit: Payer: Self-pay | Admitting: Internal Medicine

## 2019-04-04 ENCOUNTER — Ambulatory Visit: Payer: Self-pay | Admitting: Cardiology

## 2019-04-09 ENCOUNTER — Ambulatory Visit: Payer: Medicare Other | Admitting: Neurology

## 2019-04-11 ENCOUNTER — Other Ambulatory Visit: Payer: Self-pay

## 2019-04-11 ENCOUNTER — Ambulatory Visit (INDEPENDENT_AMBULATORY_CARE_PROVIDER_SITE_OTHER): Payer: Medicare Other | Admitting: Internal Medicine

## 2019-04-11 VITALS — BP 142/78 | HR 68 | Temp 98.1°F | Ht 63.4 in | Wt 258.4 lb

## 2019-04-11 DIAGNOSIS — D508 Other iron deficiency anemias: Secondary | ICD-10-CM

## 2019-04-11 DIAGNOSIS — J301 Allergic rhinitis due to pollen: Secondary | ICD-10-CM

## 2019-04-11 DIAGNOSIS — R5383 Other fatigue: Secondary | ICD-10-CM

## 2019-04-11 DIAGNOSIS — I10 Essential (primary) hypertension: Secondary | ICD-10-CM | POA: Diagnosis not present

## 2019-04-11 DIAGNOSIS — R739 Hyperglycemia, unspecified: Secondary | ICD-10-CM

## 2019-04-11 MED ORDER — POTASSIUM CHLORIDE ER 10 MEQ PO TBCR: 10.0000 meq | EXTENDED_RELEASE_TABLET | Freq: Every day | ORAL | 0 refills | Status: DC

## 2019-04-11 NOTE — Progress Notes (Signed)
This visit occurred during the SARS-CoV-2 public health emergency.  Safety protocols were in place, including screening questions prior to the visit, additional usage of staff PPE, and extensive cleaning of exam room while observing appropriate contact time as indicated for disinfecting solutions.  Subjective:     Patient ID: Rhonda [name] , female    DOB: 11/06/1950 , 68 y.o.   MRN: [id number]   Chief Complaint  Patient presents with  . Hypertension    HPI  Pt is here for HTN FU and also states she had been on 20 meq of K when discharged but I had filled the 10 meq.   Has recovered well from abdominal hernia repair 01/2019 and had her last FU 11/30.   Past Medical History:  Diagnosis Date  . Anemia    hx  . Arthritis    "maybe in my legs" (06/15/2016)  . Complication of anesthesia    woke up during colonoscopy 03-31-16 Dr. Laure Kidney  . GERD (gastroesophageal reflux disease)   . Hyperlipemia   . Hypertension   . Sinus congestion    "chronic" (06/15/2016)  . Sinus headache    "a few/month" (06/15/2016)  . Thyroid nodule 2010   "no problems w/them since; on a pill for a while" (06/15/2016)     Family History  Problem Relation Age of Onset  . COPD Father   . Breast cancer Neg Hx      Current Outpatient Medications:  .  albuterol (PROAIR HFA) 108 (90 Base) MCG/ACT inhaler, Inhale 2 puffs into the lungs every 6 (six) hours as needed for wheezing or shortness of breath., Disp: 1 Inhaler, Rfl: 5 .  atorvastatin (LIPITOR) 10 MG tablet, Take 1 tablet by mouth once daily, Disp: 90 tablet, Rfl: 0 .  Cholecalciferol (VITAMIN D-3 PO), Take 1 capsule by mouth daily., Disp: , Rfl:  .  dorzolamide-timolol (COSOPT) 22.3-6.8 MG/ML ophthalmic solution, Place 1 drop into both eyes 2 (two) times daily. , Disp: , Rfl:  .  esomeprazole (NEXIUM) 20 MG capsule, Take 20 mg by mouth daily at 12 noon., Disp: , Rfl:  .  fluticasone (FLONASE) 50 MCG/ACT nasal spray, USE 1 SPRAY(S) IN EACH NOSTRIL  ONCE DAILY (Patient taking differently: Place 1 spray into both nostrils daily. ), Disp: 16 g, Rfl: 3 .  hydrochlorothiazide (HYDRODIURIL) 25 MG tablet, Take 1 tablet by mouth once daily (Patient taking differently: Take 25 mg by mouth daily. ), Disp: 90 tablet, Rfl: 0 .  HYDROcodone-acetaminophen (NORCO/VICODIN) 5-325 MG tablet, Take 1 tablet by mouth every 6 (six) hours as needed for moderate pain., Disp: 30 tablet, Rfl: 0 .  levocetirizine (XYZAL) 5 MG tablet, TAKE 1 TABLET BY MOUTH ONCE DAILY IN THE EVENING, Disp: 30 tablet, Rfl: 0 .  Multiple Vitamin (MULTIVITAMIN WITH MINERALS) TABS tablet, Take 1 tablet by mouth daily., Disp: , Rfl:  .  potassium chloride (KLOR-CON) 10 MEQ tablet, Take 1 tablet (10 mEq total) by mouth daily., Disp: 30 tablet, Rfl: 0 .  losartan (COZAAR) 100 MG tablet, Take 1 tablet by mouth once daily (Patient taking differently: Take 100 mg by mouth daily. ), Disp: 90 tablet, Rfl: 0 .  methocarbamol (ROBAXIN) 500 MG tablet, Take 1 tablet (500 mg total) by mouth every 8 (eight) hours as needed for muscle spasms. (Patient not taking: Reported on 04/11/2019), Disp: 30 tablet, Rfl: 0   Allergies  Allergen Reactions  . Aspirin-Acetaminophen-Caffeine Nausea And Vomiting  . Codeine Diarrhea and Nausea And Vomiting  Stomach cramps   . Oxycontin [Oxycodone Hcl] Other (See Comments)    Hallucination      Review of Systems  Ears feel studdy ears, mild scratchy throat, has PND x 4 days. Denies fever, chills or cough.  Review of Systems  Constitutional: Negative for diaphoresis and unexpected weight change.  HENT: Negative for tinnitus.   Eyes: Negative for visual disturbance.  Respiratory: Negative for chest tightness and shortness of breath.   Cardiovascular: Negative for chest pain, palpitations and leg swelling.  Gastrointestinal: Negative for constipation, diarrhea and nausea.  Endocrine: Negative for polydipsia, polyphagia and polyuria.  Genitourinary: Negative for  dysuria and frequency.  Skin: Negative for rash and wound.  Neurological: Negative for dizziness, speech difficulty, weakness, numbness and headaches.  Today's Vitals   04/11/19 0837  BP: (!) 142/78  Pulse: 68  Temp: 98.1 F (36.7 C)  TempSrc: Oral  Weight: 258 lb 6.4 oz (117.2 kg)  Height: 5' 3.4" (1.61 m)   Body mass index is 45.2 kg/m.   Objective:  Physical Exam   Constitutional: She is oriented to person, place, and time. She appears well-developed and well-nourished. No distress.  HENT: nasal mucosa is pink and pale with mild swelling and clear mucous. TM's gray and little dull. Sinuses tender on both maxillay sinuses. Pharynx is clear Head: Normocephalic and atraumatic.  Right Ear: External ear normal.  Left Ear: External ear normal.  Nose: Nose normal.  Eyes: Conjunctivae are normal. Right eye exhibits no discharge. Left eye exhibits no discharge. No scleral icterus.  Neck: Neck supple. No thyromegaly present.  No carotid bruits bilaterally  Cardiovascular: Normal rate and regular rhythm.  No murmur heard. Pulmonary/Chest: Effort normal and breath sounds normal. No respiratory distress.  Musculoskeletal: Normal range of motion. She exhibits no edema.  Lymphadenopathy:    She has no cervical adenopathy.  Neurological: She is alert and oriented to person, place, and time.  Skin: Skin is warm and dry. Capillary refill takes less than 2 seconds. No rash noted. She is not diaphoretic.  Psychiatric: She has a normal mood and affect. Her behavior is normal. Judgment and thought content normal.  Nursing note reviewed.     Assessment And Plan:     1. Other iron deficiency anemia- past hx of this - CBC no Diff - Iron and IBC ([id number])  2. Essential hypertension- not well controlled today - Comprehensive metabolic panel  3. Hyperglycemia- chronic - Hemoglobin A1c  4. Other fatigue- acute - TSH - T4, free - T3, free  5. Non-seasonal allergic rhinitis due  to pollen      Advised to do saline nose rinses bid x 5 days     May change her xysol to a different antihistamine for a few months.     D/C dairy   Lakena Sparlin RODRIGUEZ-SOUTHWORTH, PA-C    THE PATIENT IS ENCOURAGED TO PRACTICE SOCIAL DISTANCING DUE TO THE COVID-19 PANDEMIC.

## 2019-04-12 ENCOUNTER — Other Ambulatory Visit: Payer: Self-pay | Admitting: Nurse Practitioner

## 2019-04-12 LAB — COMPREHENSIVE METABOLIC PANEL
ALT: 10 IU/L (ref 0–32)
AST: 13 IU/L (ref 0–40)
Albumin/Globulin Ratio: 1.6 (ref 1.2–2.2)
Albumin: 4.4 g/dL (ref 3.8–4.8)
Alkaline Phosphatase: 76 IU/L (ref 39–117)
BUN/Creatinine Ratio: 23 (ref 12–28)
BUN: 17 mg/dL (ref 8–27)
Bilirubin Total: 0.3 mg/dL (ref 0.0–1.2)
CO2: 28 mmol/L (ref 20–29)
Calcium: 9.5 mg/dL (ref 8.7–10.3)
Chloride: 101 mmol/L (ref 96–106)
Creatinine, Ser: 0.75 mg/dL (ref 0.57–1.00)
GFR calc Af Amer: 94 mL/min/{1.73_m2} (ref >59)
GFR calc non Af Amer: 82 mL/min/{1.73_m2} (ref >59)
Globulin, Total: 2.7 g/dL (ref 1.5–4.5)
Glucose: 106 mg/dL — ABNORMAL HIGH (ref 65–99)
Potassium: 3.6 mmol/L (ref 3.5–5.2)
Sodium: 142 mmol/L (ref 134–144)
Total Protein: 7.1 g/dL (ref 6.0–8.5)

## 2019-04-12 LAB — CBC
Hematocrit: 36.3 % (ref 34.0–46.6)
Hemoglobin: 11.6 g/dL (ref 11.1–15.9)
MCH: 28.2 pg (ref 26.6–33.0)
MCHC: 32 g/dL (ref 31.5–35.7)
MCV: 88 fL (ref 79–97)
Platelets: 288 10*3/uL (ref 150–450)
RBC: 4.11 x10E6/uL (ref 3.77–5.28)
RDW: 13.3 % (ref 11.7–15.4)
WBC: 4.9 10*3/uL (ref 3.4–10.8)

## 2019-04-12 LAB — IRON AND TIBC
Iron Saturation: 22 % (ref 15–55)
Iron: 52 ug/dL (ref 27–139)
Total Iron Binding Capacity: 233 ug/dL — ABNORMAL LOW (ref 250–450)
UIBC: 181 ug/dL (ref 118–369)

## 2019-04-12 LAB — T4, FREE: Free T4: 1.22 ng/dL (ref 0.82–1.77)

## 2019-04-12 LAB — HEMOGLOBIN A1C
Est. average glucose Bld gHb Est-mCnc: 123 mg/dL
Hgb A1c MFr Bld: 5.9 % — ABNORMAL HIGH (ref 4.8–5.6)

## 2019-04-12 LAB — T3, FREE: T3, Free: 3 pg/mL (ref 2.0–4.4)

## 2019-04-12 LAB — TSH: TSH: 1.62 u[IU]/mL (ref 0.450–4.500)

## 2019-04-16 ENCOUNTER — Ambulatory Visit
Admission: RE | Admit: 2019-04-16 | Discharge: 2019-04-16 | Disposition: A | Discharge status: Home / Self Care | Payer: Medicare Other | Source: Ambulatory Visit | Attending: Internal Medicine | Admitting: Internal Medicine

## 2019-04-16 ENCOUNTER — Other Ambulatory Visit: Payer: Self-pay

## 2019-04-16 DIAGNOSIS — E2839 Other primary ovarian failure: Secondary | ICD-10-CM

## 2019-04-18 ENCOUNTER — Telehealth: Payer: Self-pay

## 2019-04-18 NOTE — Telephone Encounter (Signed)
Per provider Iron in low normal still needs to consume foods rich in iron or just continue same dose if you have been taking iron pills Cell count is normal   Rodriguez-Southworth, Sunday Spillers, PA-C  Candiss Norse T, CMA  Please inform pt that her bone density test is normal

## 2019-04-19 ENCOUNTER — Encounter: Payer: Self-pay | Admitting: Internal Medicine

## 2019-04-19 ENCOUNTER — Other Ambulatory Visit: Payer: Self-pay | Admitting: Internal Medicine

## 2019-04-19 MED ORDER — POTASSIUM CHLORIDE ER 10 MEQ PO TBCR: EXTENDED_RELEASE_TABLET | ORAL | 0 refills | Status: DC

## 2019-04-22 ENCOUNTER — Other Ambulatory Visit: Payer: Self-pay

## 2019-04-22 MED ORDER — POTASSIUM CHLORIDE ER 10 MEQ PO TBCR: EXTENDED_RELEASE_TABLET | ORAL | 0 refills | Status: DC

## 2019-04-30 ENCOUNTER — Ambulatory Visit (INDEPENDENT_AMBULATORY_CARE_PROVIDER_SITE_OTHER): Payer: Medicare Other

## 2019-04-30 ENCOUNTER — Ambulatory Visit: Payer: Medicare Other | Admitting: Podiatry

## 2019-04-30 ENCOUNTER — Other Ambulatory Visit: Payer: Self-pay | Admitting: Podiatry

## 2019-04-30 ENCOUNTER — Other Ambulatory Visit: Payer: Self-pay

## 2019-04-30 DIAGNOSIS — M7732 Calcaneal spur, left foot: Secondary | ICD-10-CM | POA: Diagnosis not present

## 2019-04-30 DIAGNOSIS — M79672 Pain in left foot: Secondary | ICD-10-CM

## 2019-04-30 DIAGNOSIS — M79673 Pain in unspecified foot: Secondary | ICD-10-CM

## 2019-04-30 DIAGNOSIS — M7662 Achilles tendinitis, left leg: Secondary | ICD-10-CM

## 2019-04-30 DIAGNOSIS — M722 Plantar fascial fibromatosis: Secondary | ICD-10-CM

## 2019-04-30 MED ORDER — METHYLPREDNISOLONE 4 MG PO TBPK: ORAL_TABLET | ORAL | 0 refills | Status: DC

## 2019-04-30 MED ORDER — DICLOFENAC SODIUM 1 % EX GEL: 2.0000 g | Freq: Four times a day (QID) | CUTANEOUS | 2 refills | Status: DC

## 2019-04-30 NOTE — Patient Instructions (Signed)

## 2019-05-01 ENCOUNTER — Other Ambulatory Visit: Payer: Self-pay | Admitting: Internal Medicine

## 2019-05-01 DIAGNOSIS — M7732 Calcaneal spur, left foot: Secondary | ICD-10-CM | POA: Insufficient documentation

## 2019-05-01 NOTE — Progress Notes (Signed)
Subjective: 68 year old female presents the office today for concerns of pain to the back of the left heel.She also has pain in the arches of the feet.  She states that she was doing well on the right wrist and doing much better but the left side started about 1 month ago.  Since this started when the arch pain started on both feet.  She states it feels better if she wear shoes and hurts worse with going barefoot.  No recent injury.  She has been using the night splint as well stretching.  No radiating pain or weakness.  No falls. Denies any systemic complaints such as fevers, chills, nausea, vomiting. No acute changes since last appointment, and no other complaints at this time.   Objective: AAO x3, NAD DP/PT pulses palpable bilaterally, CRT less than 3 seconds Tenderness to the posterior aspects of the left heel.  There is no pain the plantar aspect of the heel.  There is tenderness on the medial band plantar fashion the arch of the foot subjectively but no significant discomfort today.  No pain with lateral compression of calcaneus.  Negative Tinel sign.  Thompson test is negative No pain with calf compression, swelling, warmth, erythema  Assessment: Left posterior heel spur/insertional Achilles tendinitis with arch pain   Plan: -All treatment options discussed with the patient including all alternatives, risks, complications.  -X-rays obtained and reviewed.  Calcaneal spurring is present.  No evidence of acute fracture or stress fracture. -Medrol Dosepak prescribed.  Also Voltaren gel.  Continue stretching, icing daily.  Continue night splint.  Discussed changing shoes as well as inserts/arch supports. -Patient encouraged to call the office with any questions, concerns, change in symptoms.   Trula Slade DPM

## 2019-05-22 ENCOUNTER — Ambulatory Visit (INDEPENDENT_AMBULATORY_CARE_PROVIDER_SITE_OTHER): Payer: Medicare Other | Admitting: Bariatrics

## 2019-05-29 ENCOUNTER — Other Ambulatory Visit: Payer: Self-pay | Admitting: Internal Medicine

## 2019-05-29 ENCOUNTER — Other Ambulatory Visit: Payer: Self-pay | Admitting: Nurse Practitioner

## 2019-06-05 ENCOUNTER — Ambulatory Visit (INDEPENDENT_AMBULATORY_CARE_PROVIDER_SITE_OTHER): Payer: Medicare Other | Admitting: Bariatrics

## 2019-06-11 ENCOUNTER — Ambulatory Visit: Payer: Medicare Other | Admitting: Podiatry

## 2019-07-01 ENCOUNTER — Other Ambulatory Visit: Payer: Self-pay | Admitting: Internal Medicine

## 2019-07-11 ENCOUNTER — Ambulatory Visit: Payer: Medicare Other | Admitting: Internal Medicine

## 2019-07-13 ENCOUNTER — Other Ambulatory Visit: Payer: Self-pay | Admitting: Internal Medicine

## 2019-07-18 ENCOUNTER — Other Ambulatory Visit: Payer: Self-pay

## 2019-07-18 ENCOUNTER — Encounter: Payer: Self-pay | Admitting: Internal Medicine

## 2019-07-18 ENCOUNTER — Ambulatory Visit (INDEPENDENT_AMBULATORY_CARE_PROVIDER_SITE_OTHER): Payer: Medicare Other | Admitting: Internal Medicine

## 2019-07-18 VITALS — BP 138/76 | HR 71 | Temp 97.8°F | Ht 63.2 in | Wt 258.4 lb

## 2019-07-18 DIAGNOSIS — I10 Essential (primary) hypertension: Secondary | ICD-10-CM

## 2019-07-18 DIAGNOSIS — R202 Paresthesia of skin: Secondary | ICD-10-CM

## 2019-07-18 DIAGNOSIS — Z6841 Body Mass Index (BMI) 40.0 and over, adult: Secondary | ICD-10-CM

## 2019-07-18 DIAGNOSIS — E785 Hyperlipidemia, unspecified: Secondary | ICD-10-CM

## 2019-07-18 DIAGNOSIS — R011 Cardiac murmur, unspecified: Secondary | ICD-10-CM

## 2019-07-18 DIAGNOSIS — R739 Hyperglycemia, unspecified: Secondary | ICD-10-CM | POA: Diagnosis not present

## 2019-07-18 NOTE — Progress Notes (Signed)
This visit occurred during the SARS-CoV-2 public health emergency.  Safety protocols were in place, including screening questions prior to the visit, additional usage of staff PPE, and extensive cleaning of exam room while observing appropriate contact time as indicated for disinfecting solutions.  Subjective:     Patient ID: Rhonda [name] , female    DOB: 1951/04/04 , 69 y.o.   MRN: [id number]   Chief Complaint  Patient presents with  . Hypertension    numbness in lip area at times     HPI  1- HTn FU. Last echo 2018, no hx of murmur. Denies HA, sweating, CP or SOB. Sometimes gets edema and specially when she eats foods high in sodium which she ate last night.   2- L upper numbness x 3 years. Sometimes when she eats or talks  This happens. In the past has had blisters many years ago. The numbness is present qod. Denies drooping of her lips.   Past Medical History:  Diagnosis Date  . Anemia    hx  . Arthritis    "maybe in my legs" (06/15/2016)  . Complication of anesthesia    woke up during colonoscopy 03-31-16 Dr. Laure Kidney  . GERD (gastroesophageal reflux disease)   . Hyperlipemia   . Hypertension   . Sinus congestion    "chronic" (06/15/2016)  . Sinus headache    "a few/month" (06/15/2016)  . Thyroid nodule 2010   "no problems w/them since; on a pill for a while" (06/15/2016)     Family History  Problem Relation Age of Onset  . COPD Father   . Breast cancer Neg Hx      Current Outpatient Medications:  .  albuterol (PROAIR HFA) 108 (90 Base) MCG/ACT inhaler, Inhale 2 puffs into the lungs every 6 (six) hours as needed for wheezing or shortness of breath., Disp: 1 Inhaler, Rfl: 5 .  atorvastatin (LIPITOR) 10 MG tablet, Take 1 tablet by mouth once daily, Disp: 90 tablet, Rfl: 0 .  Cholecalciferol (VITAMIN D-3 PO), Take 1 capsule by mouth daily., Disp: , Rfl:  .  diclofenac Sodium (VOLTAREN) 1 % GEL, Apply 2 g topically 4 (four) times daily. Rub into affected area of foot 2  to 4 times daily, Disp: 100 g, Rfl: 2 .  dorzolamide-timolol (COSOPT) 22.3-6.8 MG/ML ophthalmic solution, Place 1 drop into both eyes 2 (two) times daily. , Disp: , Rfl:  .  esomeprazole (NEXIUM) 20 MG capsule, Take 20 mg by mouth daily at 12 noon., Disp: , Rfl:  .  fluticasone (FLONASE) 50 MCG/ACT nasal spray, Use 1 spray(s) in each nostril once daily, Disp: 16 g, Rfl: 0 .  hydrochlorothiazide (HYDRODIURIL) 25 MG tablet, Take 1 tablet by mouth once daily, Disp: 90 tablet, Rfl: 0 .  levocetirizine (XYZAL) 5 MG tablet, TAKE 1 TABLET BY MOUTH ONCE DAILY IN THE EVENING, Disp: 30 tablet, Rfl: 0 .  losartan (COZAAR) 100 MG tablet, Take 1 tablet by mouth once daily, Disp: 90 tablet, Rfl: 0 .  Multiple Vitamin (MULTIVITAMIN WITH MINERALS) TABS tablet, Take 1 tablet by mouth daily., Disp: , Rfl:  .  potassium chloride (KLOR-CON) 10 MEQ tablet, Take 1 tablet by mouth once daily, Disp: 30 tablet, Rfl: 0   Allergies  Allergen Reactions  . Aspirin-Acetaminophen-Caffeine Nausea And Vomiting  . Codeine Diarrhea and Nausea And Vomiting    Stomach cramps   . Oxycontin [Oxycodone Hcl] Other (See Comments)    Hallucination        Review of  Systems  Constitutional: Negative for diaphoresis and unexpected weight change.  HENT: Negative for tinnitus.   Eyes: Negative for visual disturbance.  Respiratory: Negative for chest tightness and shortness of breath.   Cardiovascular: Negative for chest pain, palpitations and leg swelling.  Gastrointestinal: Negative for constipation, diarrhea and nausea.  Endocrine: Negative for polydipsia, polyphagia and polyuria.  Genitourinary: Negative for dysuria and frequency.  Skin: Negative for rash and wound.  Neurological: Negative for dizziness, speech difficulty, weakness,and headaches. + for intermittent numbness of L upper lip Admits of a lot of stress in her life. Has croinc tinnitus.  Today's Vitals   07/18/19 0856  BP: 138/76  Pulse: 71  Temp: 97.8 F (36.6  C)  TempSrc: Oral  SpO2: 96%  Weight: 258 lb 6.4 oz (117.2 kg)  Height: 5' 3.2" (1.605 m)   Body mass index is 45.48 kg/m.   Objective:  Physical Exam  Wt is unchanged  Constitutional: She is oriented to person, place, and time. She appears well-developed and well-nourished. No distress.  HENT: her lips are normal, no signs of asymmetry and no lesions noted. She is able to feel the same as the R upper side.  Head: Normocephalic and atraumatic.  Right Ear: External ear normal.  Left Ear: External ear normal.  Nose: Nose normal.  Eyes: Conjunctivae are normal. Right eye exhibits no discharge. Left eye exhibits no discharge. No scleral icterus.  Neck: Neck supple. No thyromegaly present.  No carotid bruits bilaterally  Cardiovascular: Normal rate and regular rhythm.  No murmur heard. Pulmonary/Chest: Effort normal and breath sounds normal. No respiratory distress.  Musculoskeletal: Normal range of motion. She exhibits no edema.  Lymphadenopathy:    She has no cervical adenopathy.  Neurological: She is alert and oriented to person, place, and time.  Skin: Skin is warm and dry. Capillary refill takes less than 2 seconds. No rash noted. She is not diaphoretic.  Psychiatric: She has a normal mood and affect. Her behavior is normal. Judgment and thought content normal.  Nursing note reviewed.  Assessment And Plan:     1. Heart murmur- new - Ambulatory referral to Cardiology  2. Essential hypertension- stable. FU 3 months. May continue current medication.  - CMP14 + Anion Gap - CBC no Diff - Lipid panel  3. Hyperglycemia- chronic. Needs to avoid sugars and high carb diet.  - Hemoglobin A1c  4. Class 3 severe obesity due to excess calories without serious comorbidity in adult, unspecified BMI (HCC)- chronic. Needs to continue working on wt loss  5. Hyperlipidemia, unspecified hyperlipidemia type-chronic. Mau continue Atorvastatin - Lipid panel - TSH  6. Paresthesia L upper  lip, intermittent.- Chronic. Could be related to increased stress and may be braking out in cold sores  And asked to watch for this. We will just watch it. But if at any point she sees asymmetry or drooping, needs to go to ER.   We will inform her about her labs when they are back.   Deeana Atwater RODRIGUEZ-SOUTHWORTH, PA-C    THE PATIENT IS ENCOURAGED TO PRACTICE SOCIAL DISTANCING DUE TO THE COVID-19 PANDEMIC.

## 2019-07-19 LAB — LIPID PANEL
Chol/HDL Ratio: 3.7 ratio (ref 0.0–4.4)
Cholesterol, Total: 199 mg/dL (ref 100–199)
HDL: 54 mg/dL (ref >39)
LDL Chol Calc (NIH): 133 mg/dL — ABNORMAL HIGH (ref 0–99)
Triglycerides: 63 mg/dL (ref 0–149)
VLDL Cholesterol Cal: 12 mg/dL (ref 5–40)

## 2019-07-19 LAB — CMP14 + ANION GAP
ALT: 16 IU/L (ref 0–32)
AST: 21 IU/L (ref 0–40)
Albumin/Globulin Ratio: 1.7 (ref 1.2–2.2)
Albumin: 4.6 g/dL (ref 3.8–4.8)
Alkaline Phosphatase: 89 IU/L (ref 39–117)
Anion Gap: 14 mmol/L (ref 10.0–18.0)
BUN/Creatinine Ratio: 22 (ref 12–28)
BUN: 17 mg/dL (ref 8–27)
Bilirubin Total: 0.4 mg/dL (ref 0.0–1.2)
CO2: 26 mmol/L (ref 20–29)
Calcium: 9.9 mg/dL (ref 8.7–10.3)
Chloride: 100 mmol/L (ref 96–106)
Creatinine, Ser: 0.78 mg/dL (ref 0.57–1.00)
GFR calc Af Amer: 89 mL/min/{1.73_m2} (ref >59)
GFR calc non Af Amer: 77 mL/min/{1.73_m2} (ref >59)
Globulin, Total: 2.7 g/dL (ref 1.5–4.5)
Glucose: 107 mg/dL — ABNORMAL HIGH (ref 65–99)
Potassium: 3.9 mmol/L (ref 3.5–5.2)
Sodium: 140 mmol/L (ref 134–144)
Total Protein: 7.3 g/dL (ref 6.0–8.5)

## 2019-07-19 LAB — CBC
Hematocrit: 36.5 % (ref 34.0–46.6)
Hemoglobin: 12.4 g/dL (ref 11.1–15.9)
MCH: 29.5 pg (ref 26.6–33.0)
MCHC: 34 g/dL (ref 31.5–35.7)
MCV: 87 fL (ref 79–97)
Platelets: 271 10*3/uL (ref 150–450)
RBC: 4.2 x10E6/uL (ref 3.77–5.28)
RDW: 13.8 % (ref 11.7–15.4)
WBC: 4.8 10*3/uL (ref 3.4–10.8)

## 2019-07-19 LAB — HEMOGLOBIN A1C
Est. average glucose Bld gHb Est-mCnc: 123 mg/dL
Hgb A1c MFr Bld: 5.9 % — ABNORMAL HIGH (ref 4.8–5.6)

## 2019-07-19 LAB — TSH: TSH: 1.54 u[IU]/mL (ref 0.450–4.500)

## 2019-07-25 ENCOUNTER — Inpatient Hospital Stay (HOSPITAL_COMMUNITY)
Admission: EM | Admit: 2019-07-25 | Discharge: 2019-07-28 | DRG: 336 | Disposition: A | Discharge status: Home / Self Care | Payer: Medicare Other | Attending: Surgery | Admitting: Surgery

## 2019-07-25 ENCOUNTER — Inpatient Hospital Stay (HOSPITAL_COMMUNITY): Payer: Medicare Other | Admitting: Certified Registered Nurse Anesthetist

## 2019-07-25 ENCOUNTER — Encounter (HOSPITAL_COMMUNITY): Admission: EM | Disposition: A | Discharge status: Home / Self Care | Payer: Self-pay | Source: Home / Self Care

## 2019-07-25 ENCOUNTER — Emergency Department (HOSPITAL_COMMUNITY): Payer: Medicare Other

## 2019-07-25 ENCOUNTER — Other Ambulatory Visit: Payer: Self-pay

## 2019-07-25 ENCOUNTER — Encounter (HOSPITAL_COMMUNITY): Payer: Self-pay | Admitting: Emergency Medicine

## 2019-07-25 DIAGNOSIS — I10 Essential (primary) hypertension: Secondary | ICD-10-CM | POA: Diagnosis present

## 2019-07-25 DIAGNOSIS — K219 Gastro-esophageal reflux disease without esophagitis: Secondary | ICD-10-CM | POA: Diagnosis present

## 2019-07-25 DIAGNOSIS — K56609 Unspecified intestinal obstruction, unspecified as to partial versus complete obstruction: Secondary | ICD-10-CM | POA: Diagnosis present

## 2019-07-25 DIAGNOSIS — E785 Hyperlipidemia, unspecified: Secondary | ICD-10-CM | POA: Diagnosis present

## 2019-07-25 DIAGNOSIS — Z79899 Other long term (current) drug therapy: Secondary | ICD-10-CM

## 2019-07-25 DIAGNOSIS — Z6841 Body Mass Index (BMI) 40.0 and over, adult: Secondary | ICD-10-CM | POA: Diagnosis not present

## 2019-07-25 DIAGNOSIS — Z8719 Personal history of other diseases of the digestive system: Secondary | ICD-10-CM | POA: Diagnosis not present

## 2019-07-25 DIAGNOSIS — K660 Peritoneal adhesions (postprocedural) (postinfection): Secondary | ICD-10-CM | POA: Diagnosis present

## 2019-07-25 DIAGNOSIS — Z825 Family history of asthma and other chronic lower respiratory diseases: Secondary | ICD-10-CM | POA: Diagnosis not present

## 2019-07-25 DIAGNOSIS — M1990 Unspecified osteoarthritis, unspecified site: Secondary | ICD-10-CM | POA: Diagnosis present

## 2019-07-25 DIAGNOSIS — Z20822 Contact with and (suspected) exposure to covid-19: Secondary | ICD-10-CM | POA: Diagnosis present

## 2019-07-25 DIAGNOSIS — K430 Incisional hernia with obstruction, without gangrene: Principal | ICD-10-CM | POA: Diagnosis present

## 2019-07-25 HISTORY — PX: LAPAROTOMY: SHX154

## 2019-07-25 LAB — URINALYSIS, ROUTINE W REFLEX MICROSCOPIC
Bacteria, UA: NONE SEEN
Bilirubin Urine: NEGATIVE
Glucose, UA: NEGATIVE mg/dL
Hgb urine dipstick: NEGATIVE
Ketones, ur: NEGATIVE mg/dL
Nitrite: NEGATIVE
Protein, ur: NEGATIVE mg/dL
Specific Gravity, Urine: 1.008 (ref 1.005–1.030)
pH: 5 (ref 5.0–8.0)

## 2019-07-25 LAB — CBC
HCT: 37.3 % (ref 36.0–46.0)
Hemoglobin: 12 g/dL (ref 12.0–15.0)
MCH: 28.3 pg (ref 26.0–34.0)
MCHC: 32.2 g/dL (ref 30.0–36.0)
MCV: 88 fL (ref 80.0–100.0)
Platelets: 270 10*3/uL (ref 150–400)
RBC: 4.24 MIL/uL (ref 3.87–5.11)
RDW: 14.2 % (ref 11.5–15.5)
WBC: 5.4 10*3/uL (ref 4.0–10.5)
nRBC: 0 % (ref 0.0–0.2)

## 2019-07-25 LAB — COMPREHENSIVE METABOLIC PANEL
ALT: 15 U/L (ref 0–44)
AST: 17 U/L (ref 15–41)
Albumin: 3.8 g/dL (ref 3.5–5.0)
Alkaline Phosphatase: 66 U/L (ref 38–126)
Anion gap: 9 (ref 5–15)
BUN: 7 mg/dL — ABNORMAL LOW (ref 8–23)
CO2: 30 mmol/L (ref 22–32)
Calcium: 9.3 mg/dL (ref 8.9–10.3)
Chloride: 100 mmol/L (ref 98–111)
Creatinine, Ser: 0.74 mg/dL (ref 0.44–1.00)
GFR calc Af Amer: >60 mL/min (ref >60)
GFR calc non Af Amer: >60 mL/min (ref >60)
Glucose, Bld: 117 mg/dL — ABNORMAL HIGH (ref 70–99)
Potassium: 3.5 mmol/L (ref 3.5–5.1)
Sodium: 139 mmol/L (ref 135–145)
Total Bilirubin: 0.8 mg/dL (ref 0.3–1.2)
Total Protein: 7.1 g/dL (ref 6.5–8.1)

## 2019-07-25 LAB — HEMOGLOBIN A1C
Hgb A1c MFr Bld: 6.1 % — ABNORMAL HIGH (ref 4.8–5.6)
Mean Plasma Glucose: 128.37 mg/dL

## 2019-07-25 LAB — TYPE AND SCREEN
ABO/RH(D): A POS
Antibody Screen: NEGATIVE

## 2019-07-25 LAB — LIPASE, BLOOD: Lipase: 22 U/L (ref 11–51)

## 2019-07-25 LAB — RESPIRATORY PANEL BY RT PCR (FLU A&B, COVID)
Influenza A by PCR: NEGATIVE
Influenza B by PCR: NEGATIVE
SARS Coronavirus 2 by RT PCR: NEGATIVE

## 2019-07-25 LAB — LACTIC ACID, PLASMA: Lactic Acid, Venous: 0.7 mmol/L (ref 0.5–1.9)

## 2019-07-25 LAB — CBG MONITORING, ED: Glucose-Capillary: 107 mg/dL — ABNORMAL HIGH (ref 70–99)

## 2019-07-25 SURGERY — LAPAROTOMY, EXPLORATORY
Anesthesia: General

## 2019-07-25 MED ORDER — HYDROMORPHONE HCL 1 MG/ML IJ SOLN
1.0000 mg | Freq: Once | INTRAMUSCULAR | Status: AC
  Administered 2019-07-25: 1 mg via INTRAVENOUS
  Filled 2019-07-25: qty 1

## 2019-07-25 MED ORDER — SUCCINYLCHOLINE CHLORIDE 20 MG/ML IJ SOLN
INTRAMUSCULAR | Status: DC | PRN
  Administered 2019-07-25: 120 mg via INTRAVENOUS

## 2019-07-25 MED ORDER — ENOXAPARIN SODIUM 40 MG/0.4ML ~~LOC~~ SOLN
40.0000 mg | SUBCUTANEOUS | Status: DC
  Administered 2019-07-26 – 2019-07-27 (×2): 40 mg via SUBCUTANEOUS
  Filled 2019-07-25 (×2): qty 0.4

## 2019-07-25 MED ORDER — ONDANSETRON HCL 4 MG/2ML IJ SOLN: 4.0000 mg | Freq: Once | INTRAMUSCULAR | Status: DC | PRN

## 2019-07-25 MED ORDER — FENTANYL CITRATE (PF) 250 MCG/5ML IJ SOLN
INTRAMUSCULAR | Status: DC | PRN
  Administered 2019-07-25 (×3): 50 ug via INTRAVENOUS

## 2019-07-25 MED ORDER — SUGAMMADEX SODIUM 200 MG/2ML IV SOLN
INTRAVENOUS | Status: DC | PRN
  Administered 2019-07-25: 240 mg via INTRAVENOUS

## 2019-07-25 MED ORDER — PHENYLEPHRINE 40 MCG/ML (10ML) SYRINGE FOR IV PUSH (FOR BLOOD PRESSURE SUPPORT)
PREFILLED_SYRINGE | INTRAVENOUS | Status: DC | PRN
  Administered 2019-07-25: 120 ug via INTRAVENOUS

## 2019-07-25 MED ORDER — LACTATED RINGERS IV SOLN: INTRAVENOUS | Status: DC

## 2019-07-25 MED ORDER — ONDANSETRON HCL 4 MG/2ML IJ SOLN
INTRAMUSCULAR | Status: AC
  Filled 2019-07-25: qty 2

## 2019-07-25 MED ORDER — FENTANYL CITRATE (PF) 100 MCG/2ML IJ SOLN: 25.0000 ug | INTRAMUSCULAR | Status: DC | PRN

## 2019-07-25 MED ORDER — CEFAZOLIN SODIUM-DEXTROSE 2-4 GM/100ML-% IV SOLN
2.0000 g | INTRAVENOUS | Status: AC
  Administered 2019-07-25: 2 g via INTRAVENOUS
  Filled 2019-07-25: qty 100

## 2019-07-25 MED ORDER — LIDOCAINE 2% (20 MG/ML) 5 ML SYRINGE
INTRAMUSCULAR | Status: DC | PRN
  Administered 2019-07-25: 60 mg via INTRAVENOUS

## 2019-07-25 MED ORDER — SIMETHICONE 80 MG PO CHEW: 40.0000 mg | CHEWABLE_TABLET | Freq: Four times a day (QID) | ORAL | Status: DC | PRN

## 2019-07-25 MED ORDER — SODIUM CHLORIDE 0.9 % IV SOLN: INTRAVENOUS | Status: DC

## 2019-07-25 MED ORDER — ONDANSETRON 4 MG PO TBDP: 4.0000 mg | ORAL_TABLET | Freq: Once | ORAL | Status: DC | PRN

## 2019-07-25 MED ORDER — PANTOPRAZOLE SODIUM 40 MG IV SOLR
40.0000 mg | Freq: Every day | INTRAVENOUS | Status: DC
  Administered 2019-07-26 – 2019-07-27 (×2): 40 mg via INTRAVENOUS
  Filled 2019-07-25 (×2): qty 40

## 2019-07-25 MED ORDER — DEXAMETHASONE SODIUM PHOSPHATE 10 MG/ML IJ SOLN
INTRAMUSCULAR | Status: DC | PRN
  Administered 2019-07-25: 4 mg via INTRAVENOUS

## 2019-07-25 MED ORDER — SODIUM CHLORIDE 0.9% FLUSH: 3.0000 mL | Freq: Once | INTRAVENOUS | Status: DC

## 2019-07-25 MED ORDER — METOPROLOL TARTRATE 5 MG/5ML IV SOLN: 5.0000 mg | Freq: Four times a day (QID) | INTRAVENOUS | Status: DC | PRN

## 2019-07-25 MED ORDER — ONDANSETRON HCL 4 MG/2ML IJ SOLN
4.0000 mg | Freq: Once | INTRAMUSCULAR | Status: AC
  Administered 2019-07-25: 4 mg via INTRAVENOUS
  Filled 2019-07-25: qty 2

## 2019-07-25 MED ORDER — ACETAMINOPHEN 10 MG/ML IV SOLN: 1000.0000 mg | Freq: Once | INTRAVENOUS | Status: DC | PRN

## 2019-07-25 MED ORDER — BUPIVACAINE HCL (PF) 0.25 % IJ SOLN
INTRAMUSCULAR | Status: DC | PRN
  Administered 2019-07-25: 20 mL

## 2019-07-25 MED ORDER — PHENYLEPHRINE HCL-NACL 10-0.9 MG/250ML-% IV SOLN
INTRAVENOUS | Status: DC | PRN
  Administered 2019-07-25: 50 ug/min via INTRAVENOUS

## 2019-07-25 MED ORDER — DIPHENHYDRAMINE HCL 12.5 MG/5ML PO ELIX: 12.5000 mg | ORAL_SOLUTION | Freq: Four times a day (QID) | ORAL | Status: DC | PRN

## 2019-07-25 MED ORDER — ONDANSETRON HCL 4 MG/2ML IJ SOLN: 4.0000 mg | Freq: Four times a day (QID) | INTRAMUSCULAR | Status: DC | PRN

## 2019-07-25 MED ORDER — MIDAZOLAM HCL 5 MG/5ML IJ SOLN
INTRAMUSCULAR | Status: DC | PRN
  Administered 2019-07-25: 1 mg via INTRAVENOUS

## 2019-07-25 MED ORDER — ACETAMINOPHEN 650 MG RE SUPP: 650.0000 mg | Freq: Four times a day (QID) | RECTAL | Status: DC | PRN

## 2019-07-25 MED ORDER — FENTANYL CITRATE (PF) 100 MCG/2ML IJ SOLN
25.0000 ug | INTRAMUSCULAR | Status: DC | PRN
  Administered 2019-07-25 – 2019-07-27 (×4): 25 ug via INTRAVENOUS
  Filled 2019-07-25 (×4): qty 2

## 2019-07-25 MED ORDER — SODIUM CHLORIDE 0.9 % IV BOLUS
500.0000 mL | Freq: Once | INTRAVENOUS | Status: AC
  Administered 2019-07-25: 500 mL via INTRAVENOUS

## 2019-07-25 MED ORDER — ONDANSETRON 4 MG PO TBDP: 4.0000 mg | ORAL_TABLET | Freq: Four times a day (QID) | ORAL | Status: DC | PRN

## 2019-07-25 MED ORDER — INSULIN ASPART 100 UNIT/ML ~~LOC~~ SOLN: 0.0000 [IU] | Freq: Three times a day (TID) | SUBCUTANEOUS | Status: DC

## 2019-07-25 MED ORDER — ONDANSETRON HCL 4 MG/2ML IJ SOLN
INTRAMUSCULAR | Status: DC | PRN
  Administered 2019-07-25: 4 mg via INTRAVENOUS

## 2019-07-25 MED ORDER — FENTANYL CITRATE (PF) 250 MCG/5ML IJ SOLN
INTRAMUSCULAR | Status: AC
  Filled 2019-07-25: qty 5

## 2019-07-25 MED ORDER — ROCURONIUM BROMIDE 10 MG/ML (PF) SYRINGE
PREFILLED_SYRINGE | INTRAVENOUS | Status: DC | PRN
  Administered 2019-07-25: 50 mg via INTRAVENOUS

## 2019-07-25 MED ORDER — 0.9 % SODIUM CHLORIDE (POUR BTL) OPTIME
TOPICAL | Status: DC | PRN
  Administered 2019-07-25: 1000 mL

## 2019-07-25 MED ORDER — ACETAMINOPHEN 325 MG PO TABS: 650.0000 mg | ORAL_TABLET | Freq: Four times a day (QID) | ORAL | Status: DC | PRN

## 2019-07-25 MED ORDER — TRAMADOL HCL 50 MG PO TABS
50.0000 mg | ORAL_TABLET | Freq: Four times a day (QID) | ORAL | Status: DC | PRN
  Administered 2019-07-27 – 2019-07-28 (×3): 50 mg via ORAL
  Filled 2019-07-25 (×3): qty 1

## 2019-07-25 MED ORDER — IOHEXOL 300 MG/ML  SOLN
100.0000 mL | Freq: Once | INTRAMUSCULAR | Status: AC | PRN
  Administered 2019-07-25: 100 mL via INTRAVENOUS

## 2019-07-25 MED ORDER — DEXAMETHASONE SODIUM PHOSPHATE 10 MG/ML IJ SOLN
INTRAMUSCULAR | Status: AC
  Filled 2019-07-25: qty 1

## 2019-07-25 MED ORDER — PROPOFOL 10 MG/ML IV BOLUS
INTRAVENOUS | Status: DC | PRN
  Administered 2019-07-25: 80 mg via INTRAVENOUS
  Administered 2019-07-25: 120 mg via INTRAVENOUS

## 2019-07-25 MED ORDER — DIPHENHYDRAMINE HCL 50 MG/ML IJ SOLN: 12.5000 mg | Freq: Four times a day (QID) | INTRAMUSCULAR | Status: DC | PRN

## 2019-07-25 MED ORDER — MIDAZOLAM HCL 2 MG/2ML IJ SOLN
INTRAMUSCULAR | Status: AC
  Filled 2019-07-25: qty 2

## 2019-07-25 MED ORDER — BUPIVACAINE HCL (PF) 0.25 % IJ SOLN
INTRAMUSCULAR | Status: AC
  Filled 2019-07-25: qty 30

## 2019-07-25 SURGICAL SUPPLY — 39 items
APL PRP STRL LF DISP 70% ISPRP (MISCELLANEOUS) ×1
BINDER ABDOMINAL 12 XL 75-84 (SOFTGOODS) ×1 IMPLANT
BLADE CLIPPER SURG (BLADE) IMPLANT
CANISTER SUCT 3000ML PPV (MISCELLANEOUS) ×2 IMPLANT
CHLORAPREP W/TINT 26 (MISCELLANEOUS) ×2 IMPLANT
COVER SURGICAL LIGHT HANDLE (MISCELLANEOUS) ×2 IMPLANT
COVER WAND RF STERILE (DRAPES) ×2 IMPLANT
DRAPE LAPAROSCOPIC ABDOMINAL (DRAPES) ×2 IMPLANT
DRAPE WARM FLUID 44X44 (DRAPES) ×2 IMPLANT
DRSG OPSITE POSTOP 4X10 (GAUZE/BANDAGES/DRESSINGS) ×1 IMPLANT
ELECT BLADE 6.5 EXT (BLADE) IMPLANT
ELECT REM PT RETURN 9FT ADLT (ELECTROSURGICAL) ×2
ELECTRODE REM PT RTRN 9FT ADLT (ELECTROSURGICAL) ×1 IMPLANT
GLOVE BIO SURGEON STRL SZ7.5 (GLOVE) ×2 IMPLANT
GLOVE INDICATOR 8.0 STRL GRN (GLOVE) ×2 IMPLANT
GOWN STRL REUS W/ TWL LRG LVL3 (GOWN DISPOSABLE) ×1 IMPLANT
GOWN STRL REUS W/ TWL XL LVL3 (GOWN DISPOSABLE) ×1 IMPLANT
GOWN STRL REUS W/TWL LRG LVL3 (GOWN DISPOSABLE) ×2
GOWN STRL REUS W/TWL XL LVL3 (GOWN DISPOSABLE) ×2
HANDLE SUCTION POOLE (INSTRUMENTS) ×1 IMPLANT
KIT BASIN OR (CUSTOM PROCEDURE TRAY) ×2 IMPLANT
KIT TURNOVER KIT B (KITS) ×2 IMPLANT
LIGASURE IMPACT 36 18CM CVD LR (INSTRUMENTS) IMPLANT
NS IRRIG 1000ML POUR BTL (IV SOLUTION) ×4 IMPLANT
PACK GENERAL/GYN (CUSTOM PROCEDURE TRAY) ×2 IMPLANT
PAD ARMBOARD 7.5X6 YLW CONV (MISCELLANEOUS) ×2 IMPLANT
PENCIL SMOKE EVACUATOR (MISCELLANEOUS) ×2 IMPLANT
SPECIMEN JAR LARGE (MISCELLANEOUS) IMPLANT
SPONGE LAP 18X18 RF (DISPOSABLE) IMPLANT
STAPLER VISISTAT 35W (STAPLE) ×2 IMPLANT
SUCTION POOLE HANDLE (INSTRUMENTS) ×2
SUT PDS AB 1 TP1 96 (SUTURE) ×4 IMPLANT
SUT SILK 2 0 SH CR/8 (SUTURE) ×2 IMPLANT
SUT SILK 2 0 TIES 10X30 (SUTURE) ×2 IMPLANT
SUT SILK 3 0 SH CR/8 (SUTURE) ×2 IMPLANT
SUT SILK 3 0 TIES 10X30 (SUTURE) ×2 IMPLANT
TOWEL GREEN STERILE (TOWEL DISPOSABLE) ×2 IMPLANT
TRAY FOLEY MTR SLVR 16FR STAT (SET/KITS/TRAYS/PACK) ×2 IMPLANT
YANKAUER SUCT BULB TIP NO VENT (SUCTIONS) IMPLANT
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
## 2019-07-25 NOTE — ED Triage Notes (Signed)
Hx of multiple abd surgeries, now having abd pain and nausea and diarrhea-- started Tuesday evening. Called dr- told to come here.

## 2019-07-25 NOTE — Anesthesia Procedure Notes (Signed)
Procedure Name: Intubation Date/Time: 07/25/2019 7:48 PM Performed by: Colon Flattery, CRNA Pre-anesthesia Checklist: Patient identified, Emergency Drugs available, Suction available and Patient being monitored Patient Re-evaluated:Patient Re-evaluated prior to induction Oxygen Delivery Method: Circle system utilized Preoxygenation: Pre-oxygenation with 100% oxygen Induction Type: IV induction Ventilation: Mask ventilation without difficulty Laryngoscope Size: Miller and 2 Grade View: Grade II Tube type: Oral Tube size: 7.0 mm Number of attempts: 1 Airway Equipment and Method: Stylet Placement Confirmation: ETT inserted through vocal cords under direct vision,  positive ETCO2 and breath sounds checked- equal and bilateral Secured at: 22 cm Tube secured with: Tape Dental Injury: Teeth and Oropharynx as per pre-operative assessment

## 2019-07-25 NOTE — Op Note (Signed)
07/25/2019  8:57 PM  PATIENT:  Rhonda [name]  69 y.o. female  Patient Care Team: Rodriguez-Southworth, Sandrea Matte as PCP - General (Internal Medicine)  PRE-OPERATIVE DIAGNOSIS:  Incarcerated small bowel containing incisional hernia  POST-OPERATIVE DIAGNOSIS:  Same  PROCEDURE:  Exploratory laparotomy with primary repair of recurrent incisional hernia  SURGEON:  Sharon Mt. Ramell Wacha, MD  ANESTHESIA:   general  COUNTS:  Sponge, needle and instrument counts were reported correct x2 at the conclusion of the operation.  EBL: 10 mL  DRAINS: None  SPECIMEN: None  COMPLICATIONS: None  FINDINGS: Dense, tenacious scar tissue and obliterated tissue planes due to amount of scar tissue present.  All bowel was ultimately able to be reduced and the hernias were closed primarily given the tenacity of adhesions.  DISPOSITION: PACU in satisfactory condition  INDICATION: 69yoF with history of numerous ventral hernia repair operations following an open retro-rectus ventral hernia repair 2018. She presented with recurrence at and inferior to umbilicus. She was experiencing abdominal pain. She underwent workup which showed findings concerning for recurrence with likely small bowel obstruction at fascial defect. We discussed option going forward and she opted to pursue surgery.  Please refer to notes elsewhere for details regarding his discussion.  DESCRIPTION: The patient was identified in preop holding and taken to the OR where she was placed on the operating room table. SCDs were placed. General endotracheal anesthesia was induced without difficulty.  Foley catheter was placed under sterile conditions. She was then prepped and draped in the usual sterile fashion. A surgical timeout was performed indicating the correct patient, procedure, positioning and need for preoperative antibiotics.   Beginning well above her umbilicus, a skin incision was created and carried down through the subcutaneous  tissue.  There is significant scar tissue related to her prior surgeries present even within the subcutaneous tissue.  This was carefully opened sharply.  Working down towards the umbilicus, a very thick fibrotic "conglomerate" of tissue was encountered.  This was carefully dissected sharply with Metzenbaum scissors.  This was approximately 1 cm thick.  Upon opening it, it was apparent that this also had incorporated peritoneum with it.  Small bowel was away from this.  There was small bowel visible within the peritoneal cavity this level and dense thick band adhesions present to it.  The incision was carried inferiorly to the second hernia site.  The incarcerated small bowel was able to be reduced with gentle application of pressure.  The bowel was clearly viable and was not injected or erythematous.  The small bowel was adhesed to the fascial defect.  This was tediously and carefully dissected away using Metzenbaum scissors.  This bowel was inspected and noted to be free of any serosal tears or injuries. Also healthy in appearance.  At this point, it was apparent that there were no incarcerated bowel containing hernias.  Kocher clamps were placed on the edges of the fascia.  Subcutaneous tissue overlying these defects was freed.  It is not clear if the scar is old mesh or actually fascia.  Regardless, there is at least 1 cm of clearance from the edge.  This was then closed transversely as this resulted in the least amount of tension on the repair.  The defect measured approximately 2 x 3 cm each.  These were closed with interrupted Novafil sutures.  The defects were palpated and noted to be closed.  The subcutaneous tissue was then irrigated with sterile saline.  Hemostasis was verified.  Sponge, needle, and  instrument counts were reported correct.  The subcutaneous tissue was approximated with 3-0 Vicryl sutures.  3-0 Vicryl deep dermal sutures were then also placed.  The skin was then closed with staples.  A  dressing consisting of a honeycomb was then placed.  An NG tube had been placed by anesthesia.  She was then awakened from anesthesia, extubated, and transferred to a stretcher for transport to PACU in satisfactory condition.

## 2019-07-25 NOTE — Progress Notes (Signed)
I have also seen and evaluated her. She has ongoing issues with discomfort about her hernia. Sharp/crampy at this location.   AF VSS No acute distress Normal respiratory effort RRR Abdomen is obese, soft, not significantly distended per se; midline incision scar present with focal tenderness about the more superior hernia which has significant scar tissue around it - therefore unclear if even partially reducible. More inferior defect is soft and does appear to slide back and forth  Rhonda [name] is a very pleasant 69yoF with likely incarcerated, recurrent incisional hernia containing bowel  I had a long discussion with her and her daughter at bedside tonight. We reviewed options moving forward including short interval observation vs surgery. Risks of incarcerated bowel becoming strangulated if this were the case.  -The anatomy and physiology of the GI tract and abdominal wall was discussed at length with the patient and her daughter. The pathophysiology of hernias was discussed at length as well. -We reviewed exploratory laparotomy with possible bowel resection - particularly given her prior surgical history and potential for tenacious adhesions. We discussed no formal abdominal wall reconstruction at this go round as this is now a redo-redo but likely an attempt at primary repair, given acuity and bmi currently with high risk of recurrence.  -The planned procedure, material risks (including, but not limited to, pain, bleeding, infection, scarring, need for blood transfusion, damage to surrounding structures-blood vessels/nerves/viscus/organs, need for additional procedures, recurrence which is high in her case, chronic pain, worsening of pre-existing medical conditions, pneumonia, heart attack, stroke, death) benefits and alternatives to surgery were discussed at length. The patient's questions were answered to her satisfaction, she voiced understanding and elected to proceed with surgery.  Additionally, we discussed typical postoperative expectations and the recovery process.  Marin Olp, M.D. [facility]

## 2019-07-25 NOTE — ED Notes (Signed)
Pt ambulated to bathroom without difficulty.

## 2019-07-25 NOTE — Anesthesia Preprocedure Evaluation (Addendum)
Anesthesia Evaluation  Patient identified by MRN, date of birth, ID band Patient awake    Reviewed: Allergy & Precautions, NPO status , Patient's Chart, lab work & pertinent test results  Airway Mallampati: II  TM Distance: >3 FB Neck ROM: Full    Dental no notable dental hx.    Pulmonary PE   Pulmonary exam normal breath sounds clear to auscultation       Cardiovascular hypertension, Pt. on medications Normal cardiovascular exam Rhythm:Regular Rate:Normal  ECG: rate 62   Neuro/Psych  Headaches,    GI/Hepatic Neg liver ROS, GERD  Medicated and Controlled,  Endo/Other  Morbid obesity  Renal/GU negative Renal ROS     Musculoskeletal negative musculoskeletal ROS (+)   Abdominal (+) + obese,   Peds  Hematology HLD   Anesthesia Other Findings INCARCERATED INCISIONAL HERNIA WITH SMALL BOWEL OBSTRUCTION  Reproductive/Obstetrics                            Anesthesia Physical Anesthesia Plan  ASA: III and emergent  Anesthesia Plan: General   Post-op Pain Management:    Induction: Intravenous and Rapid sequence  PONV Risk Score and Plan: 4 or greater and Ondansetron, Dexamethasone, Treatment may vary due to age or medical condition and Midazolam  Airway Management Planned: Oral ETT  Additional Equipment:   Intra-op Plan:   Post-operative Plan: Extubation in OR  Informed Consent: I have reviewed the patients History and Physical, chart, labs and discussed the procedure including the risks, benefits and alternatives for the proposed anesthesia with the patient or authorized representative who has indicated his/her understanding and acceptance.     Dental advisory given  Plan Discussed with: CRNA  Anesthesia Plan Comments:         Anesthesia Quick Evaluation

## 2019-07-25 NOTE — Anesthesia Postprocedure Evaluation (Signed)
Anesthesia Post Note  Patient: Rhonda [name]  Procedure(s) Performed: EXPLORATORY LAPAROTOMY  Primary INCISIONAL HENIA REPAIR (N/A )     Patient location during evaluation: PACU Anesthesia Type: General Level of consciousness: awake and alert Pain management: pain level controlled Vital Signs Assessment: post-procedure vital signs reviewed and stable Respiratory status: spontaneous breathing, nonlabored ventilation, respiratory function stable and patient connected to nasal cannula oxygen Cardiovascular status: blood pressure returned to baseline and stable Postop Assessment: no apparent nausea or vomiting Anesthetic complications: no    Last Vitals:  Vitals:   07/25/19 2155 07/25/19 2208  BP: (!) 141/53 (!) 147/53  Pulse: 76 74  Resp: 18 14  Temp: 36.9 C 36.6 C  SpO2: 100% 99%    Last Pain:  Vitals:   07/25/19 2208  TempSrc: Oral  PainSc:                  [name]

## 2019-07-25 NOTE — Transfer of Care (Addendum)
Immediate Anesthesia Transfer of Care Note  Patient: Rhonda [name]  Procedure(s) Performed: EXPLORATORY LAPAROTOMY  Primary INCISIONAL HENIA REPAIR (N/A )  Patient Location: PACU  Anesthesia Type:General  Level of Consciousness: awake, alert  and oriented  Airway & Oxygen Therapy: Patient Spontanous Breathing and Patient connected to nasal cannula oxygen  Post-op Assessment: Report given to RN, Post -op Vital signs reviewed and stable and Patient moving all extremities  Post vital signs: Reviewed and stable  Last Vitals:  Vitals Value Taken Time  BP 146/57 07/25/19 2140  Temp 36.7 C 07/25/19 2110  Pulse 73 07/25/19 2144  Resp 15 07/25/19 2144  SpO2 100 % 07/25/19 2144  Vitals shown include unvalidated device data.  Last Pain:  Vitals:   07/25/19 2110  TempSrc:   PainSc: 0-No pain         Complications: No apparent anesthesia complications

## 2019-07-25 NOTE — ED Provider Notes (Signed)
Received signout at the beginning of shift.  Please see previous providers notes for complete H&P.  This is a 69 year old female with history of prior SBO presenting for evaluation generalized abdominal pain ongoing for the past 2 days with associated nausea without vomiting and also no bowel movement since yesterday.  Today her labs are reassuring however and abdominal and pelvis CT scan obtained showing an infraumbilical hernia that does contain loops of small bowel and appears to be resulting in dilatation and obstructions at the more proximal small bowel.  Will consult surgery for management.  4:17 PM Appreciate consultation from general surgery PA Casimiro Needle who will see pt in the ER and will admit for further management of her SBO.  Since pt hasn't vomited in the ER, will hold of NG tube for now.  covid-19 screening test ordered.   Rhonda [name] was evaluated in Emergency Department on 07/25/2019 for the symptoms described in the history of present illness. She was evaluated in the context of the global COVID-19 pandemic, which necessitated consideration that the patient might be at risk for infection with the SARS-CoV-2 virus that causes COVID-19. Institutional protocols and algorithms that pertain to the evaluation of patients at risk for COVID-19 are in a state of rapid change based on information released by regulatory bodies including the CDC and federal and state organizations. These policies and algorithms were followed during the patient's care in the ED.   BP (!) 150/53 (BP Location: Right Arm)   Pulse 65   Temp 98.1 F (36.7 C) (Oral)   Resp 18   Ht 5\' 3"  (1.6 m)   Wt 117 kg   SpO2 99%   BMI 45.70 kg/m   Results for orders placed or performed during the hospital encounter of 07/25/19  Lipase, blood  Result Value Ref Range   Lipase 22 11 - 51 U/L  Comprehensive metabolic panel  Result Value Ref Range   Sodium 139 135 - 145 mmol/L   Potassium 3.5 3.5 - 5.1 mmol/L    Chloride 100 98 - 111 mmol/L   CO2 30 22 - 32 mmol/L   Glucose, Bld 117 (H) 70 - 99 mg/dL   BUN 7 (L) 8 - 23 mg/dL   Creatinine, Ser 07/27/19 0.44 - 1.00 mg/dL   Calcium 9.3 8.9 - 3.87 mg/dL   Total Protein 7.1 6.5 - 8.1 g/dL   Albumin 3.8 3.5 - 5.0 g/dL   AST 17 15 - 41 U/L   ALT 15 0 - 44 U/L   Alkaline Phosphatase 66 38 - 126 U/L   Total Bilirubin 0.8 0.3 - 1.2 mg/dL   GFR calc non Af Amer >60 >60 mL/min   GFR calc Af Amer >60 >60 mL/min   Anion gap 9 5 - 15  CBC  Result Value Ref Range   WBC 5.4 4.0 - 10.5 K/uL   RBC 4.24 3.87 - 5.11 MIL/uL   Hemoglobin 12.0 12.0 - 15.0 g/dL   HCT 56.4 33.2 - 95.1 %   MCV 88.0 80.0 - 100.0 fL   MCH 28.3 26.0 - 34.0 pg   MCHC 32.2 30.0 - 36.0 g/dL   RDW 88.4 16.6 - 06.3 %   Platelets 270 150 - 400 K/uL   nRBC 0.0 0.0 - 0.2 %  Urinalysis, Routine w reflex microscopic  Result Value Ref Range   Color, Urine STRAW (A) YELLOW   APPearance CLEAR CLEAR   Specific Gravity, Urine 1.008 1.005 - 1.030   pH 5.0  5.0 - 8.0   Glucose, UA NEGATIVE NEGATIVE mg/dL   Hgb urine dipstick NEGATIVE NEGATIVE   Bilirubin Urine NEGATIVE NEGATIVE   Ketones, ur NEGATIVE NEGATIVE mg/dL   Protein, ur NEGATIVE NEGATIVE mg/dL   Nitrite NEGATIVE NEGATIVE   Leukocytes,Ua SMALL (A) NEGATIVE   RBC / HPF 0-5 0 - 5 RBC/hpf   WBC, UA 0-5 0 - 5 WBC/hpf   Bacteria, UA NONE SEEN NONE SEEN   Squamous Epithelial / LPF 0-5 0 - 5   CT Abdomen Pelvis W Contrast  Result Date: 07/25/2019 CLINICAL DATA:  Acute generalized abdominal pain. EXAM: CT ABDOMEN AND PELVIS WITH CONTRAST TECHNIQUE: Multidetector CT imaging of the abdomen and pelvis was performed using the standard protocol following bolus administration of intravenous contrast. CONTRAST:  138mL OMNIPAQUE IOHEXOL 300 MG/ML  SOLN COMPARISON:  February 13, 2019. FINDINGS: Lower chest: Mild sliding-type hiatal hernia. Visualized lung bases are unremarkable. Hepatobiliary: No focal liver abnormality is seen. No gallstones, gallbladder  wall thickening, or biliary dilatation. Pancreas: Unremarkable. No pancreatic ductal dilatation or surrounding inflammatory changes. Spleen: Normal in size without focal abnormality. Adrenals/Urinary Tract: Adrenal glands appear normal. Small right renal cyst is noted. No hydronephrosis or renal obstruction is noted. No renal or ureteral calculi is noted. Urinary bladder is unremarkable. Stomach/Bowel: The stomach appears normal. The appendix is not visualized. Moderate size periumbilical hernia is noted which contains a loop of small bowel, but does not appear to be resulting in obstruction. However, larger infraumbilical hernia is noted which does contain loop of small bowel as well, and does appear to be resulting in dilatation and obstruction of the more proximal small bowel. No colonic dilatation is noted. Vascular/Lymphatic: Aortic atherosclerosis. No enlarged abdominal or pelvic lymph nodes. Reproductive: Calcified uterine fibroid is noted. No adnexal abnormality is noted. Other: No ascites is noted. Musculoskeletal: No acute or significant osseous findings. IMPRESSION: 1. Moderate size periumbilical hernia is noted which contains a loop of small bowel, but does not appear to be resulting in obstruction. However, larger infraumbilical hernia is noted which does contain loop of small bowel as well, and does appear to be resulting in dilatation and obstruction of the more proximal small bowel. 2. Mild sliding-type hiatal hernia. 3. Calcified uterine fibroid. Aortic Atherosclerosis (ICD10-I70.0). Electronically Signed   By: Marijo Conception M.D.   On: 07/25/2019 15:44      Domenic Moras, PA-C 07/25/19 1618    Wyvonnia Dusky, MD 07/25/19 [phone]

## 2019-07-25 NOTE — ED Notes (Signed)
Patient transported to CT 

## 2019-07-26 ENCOUNTER — Inpatient Hospital Stay (HOSPITAL_COMMUNITY): Payer: Medicare Other

## 2019-07-26 DIAGNOSIS — K56609 Unspecified intestinal obstruction, unspecified as to partial versus complete obstruction: Secondary | ICD-10-CM

## 2019-07-26 LAB — CBC
HCT: 37 % (ref 36.0–46.0)
Hemoglobin: 12.1 g/dL (ref 12.0–15.0)
MCH: 28.4 pg (ref 26.0–34.0)
MCHC: 32.7 g/dL (ref 30.0–36.0)
MCV: 86.9 fL (ref 80.0–100.0)
Platelets: 272 10*3/uL (ref 150–400)
RBC: 4.26 MIL/uL (ref 3.87–5.11)
RDW: 14.2 % (ref 11.5–15.5)
WBC: 9.9 10*3/uL (ref 4.0–10.5)
nRBC: 0 % (ref 0.0–0.2)

## 2019-07-26 LAB — GLUCOSE, CAPILLARY
Glucose-Capillary: 92 mg/dL (ref 70–99)
Glucose-Capillary: 98 mg/dL (ref 70–99)
Glucose-Capillary: 87 mg/dL (ref 70–99)
Glucose-Capillary: 108 mg/dL — ABNORMAL HIGH (ref 70–99)

## 2019-07-26 LAB — BASIC METABOLIC PANEL
Anion gap: 11 (ref 5–15)
BUN: 8 mg/dL (ref 8–23)
CO2: 26 mmol/L (ref 22–32)
Calcium: 8.9 mg/dL (ref 8.9–10.3)
Chloride: 102 mmol/L (ref 98–111)
Creatinine, Ser: 0.77 mg/dL (ref 0.44–1.00)
GFR calc Af Amer: >60 mL/min (ref >60)
GFR calc non Af Amer: >60 mL/min (ref >60)
Glucose, Bld: 143 mg/dL — ABNORMAL HIGH (ref 70–99)
Potassium: 3.5 mmol/L (ref 3.5–5.1)
Sodium: 139 mmol/L (ref 135–145)

## 2019-07-26 MED ORDER — POTASSIUM CHLORIDE 10 MEQ/100ML IV SOLN: 10.0000 meq | INTRAVENOUS | Status: DC

## 2019-07-26 MED ORDER — PHENOL 1.4 % MT LIQD
1.0000 | OROMUCOSAL | Status: DC | PRN
  Filled 2019-07-26: qty 177

## 2019-07-26 MED ORDER — POTASSIUM CHLORIDE 10 MEQ/100ML IV SOLN
10.0000 meq | INTRAVENOUS | Status: AC
  Administered 2019-07-26 (×4): 10 meq via INTRAVENOUS
  Filled 2019-07-26: qty 100

## 2019-07-26 MED ORDER — KCL IN DEXTROSE-NACL 20-5-0.45 MEQ/L-%-% IV SOLN
INTRAVENOUS | Status: DC
  Filled 2019-07-26 (×4): qty 1000

## 2019-07-26 MED ORDER — POTASSIUM CHLORIDE CRYS ER 20 MEQ PO TBCR: 40.0000 meq | EXTENDED_RELEASE_TABLET | Freq: Once | ORAL | Status: DC

## 2019-07-26 MED ORDER — ACETAMINOPHEN 10 MG/ML IV SOLN
1000.0000 mg | Freq: Four times a day (QID) | INTRAVENOUS | Status: DC
  Administered 2019-07-26: 1000 mg via INTRAVENOUS
  Filled 2019-07-26: qty 100

## 2019-07-26 MED ORDER — CHLORHEXIDINE GLUCONATE CLOTH 2 % EX PADS
6.0000 | MEDICATED_PAD | Freq: Every day | CUTANEOUS | Status: DC
  Administered 2019-07-26 – 2019-07-27 (×2): 6 via TOPICAL

## 2019-07-26 MED ORDER — ACETAMINOPHEN 10 MG/ML IV SOLN
1000.0000 mg | Freq: Four times a day (QID) | INTRAVENOUS | Status: AC
  Administered 2019-07-26 – 2019-07-27 (×3): 1000 mg via INTRAVENOUS
  Filled 2019-07-26 (×3): qty 100

## 2019-07-26 NOTE — Progress Notes (Signed)
Pt has done well today with NGT out and Foley DC, she has been able to ambulate, sit up in chair and void, using IS after instruction.  Dtr here to visit.  Minimal pain medicine.

## 2019-07-26 NOTE — Progress Notes (Signed)
Bilateral lower extremity venous duplex completed. Refer to "CV Proc" under chart review to view preliminary results.  07/26/2019 3:14 PM Eula Fried., MHA, RVT, RDCS, RDMS

## 2019-07-26 NOTE — Evaluation (Signed)
Physical Therapy Evaluation Patient Details Name: Rhonda [name] MRN: [id number] DOB: [birth year] Today's Date: 07/26/2019   History of Present Illness  Pt is a 69 y/o female admitted secondary to increased abdominal pain. Found to have SBO and is s/p hernia repair. PMH includes HTN, anemia, and s/p lumbar surgery.   Clinical Impression  Pt admitted secondary to problem above with deficits below. Mobility limited secondary to dizziness, however, pt was able to get to chair this session. Required min to min guard for mobility tasks. Anticipate pt will progress well once feeling better. Pt's daughter reports she will be staying with pt at d/c. Will continue to follow acutely to maximize functional mobility independence and safety.     Follow Up Recommendations Home health PT;Supervision for mobility/OOB    Equipment Recommendations  3in1 (PT)    Recommendations for Other Services       Precautions / Restrictions Precautions Precautions: Fall Restrictions Weight Bearing Restrictions: No      Mobility  Bed Mobility Overal bed mobility: Needs Assistance Bed Mobility: Supine to Sit     Supine to sit: Min assist;HOB elevated     General bed mobility comments: Min A for trunk elevation. Increased time to come to sitting.   Transfers Overall transfer level: Needs assistance Equipment used: Rolling walker (2 wheeled) Transfers: Sit to/from [facility] Sit to Stand: Min guard;From elevated surface Stand pivot transfers: Min guard       General transfer comment: Min guard for safety to stand and transfer to recliner. Pt with some dizziness, so further mobility referred. Symptoms improved with seated rest.   Ambulation/Gait                Stairs            Wheelchair Mobility    Modified Rankin (Stroke Patients Only)       Balance Overall balance assessment: Needs assistance Sitting-balance support: No upper extremity supported;Feet  supported Sitting balance-Leahy Scale: Good     Standing balance support: Bilateral upper extremity supported;During functional activity Standing balance-Leahy Scale: Poor Standing balance comment: Reliant on BUE support                              Pertinent Vitals/Pain Pain Assessment: Faces Faces Pain Scale: Hurts even more Pain Location: abdomen Pain Descriptors / Indicators: Grimacing;Guarding Pain Intervention(s): Limited activity within patient's tolerance;Monitored during session;Repositioned    Home Living Family/patient expects to be discharged to:: Private residence Living Arrangements: Parent;Children Available Help at Discharge: Family;Available 24 hours/day Type of Home: House Home Access: Stairs to enter Entrance Stairs-Rails: Right;Left;Can reach both Entrance Stairs-Number of Steps: 4   Home Equipment: Walker - 2 wheels;Shower seat Additional Comments: Aide there for her mom 1.5 hours M-F; 1 hour at night W-F; Saturdays 2 hours during the day and 1 hour at night.     Prior Function Level of Independence: Independent         Comments: Cares for 74 y/o mother     Hand Dominance   Dominant Hand: Right    Extremity/Trunk Assessment   Upper Extremity Assessment Upper Extremity Assessment: Defer to OT evaluation    Lower Extremity Assessment Lower Extremity Assessment: Generalized weakness    Cervical / Trunk Assessment Cervical / Trunk Assessment: Normal  Communication   Communication: No difficulties  Cognition Arousal/Alertness: Awake/alert Behavior During Therapy: WFL for tasks assessed/performed Overall Cognitive Status: Within Functional Limits for tasks assessed  General Comments General comments (skin integrity, edema, etc.): Pt's daughter present during session    Exercises     Assessment/Plan    PT Assessment Patient needs continued PT services  PT Problem List  Decreased strength;Decreased mobility;Decreased activity tolerance       PT Treatment Interventions Gait training;DME instruction;Stair training;Functional mobility training;Therapeutic activities;Therapeutic exercise;Balance training;Patient/family education    PT Goals (Current goals can be found in the Care Plan section)  Acute Rehab PT Goals Patient Stated Goal: to go home PT Goal Formulation: With patient Time For Goal Achievement: 08/09/19 Potential to Achieve Goals: Good    Frequency Min 3X/week   Barriers to discharge        Co-evaluation               AM-PAC PT "6 Clicks" Mobility  Outcome Measure Help needed turning from your back to your side while in a flat bed without using bedrails?: A Little Help needed moving from lying on your back to sitting on the side of a flat bed without using bedrails?: A Little Help needed moving to and from a bed to a chair (including a wheelchair)?: A Little Help needed standing up from a chair using your arms (e.g., wheelchair or bedside chair)?: A Little Help needed to walk in hospital room?: A Little Help needed climbing 3-5 steps with a railing? : A Lot 6 Click Score: 17    End of Session Equipment Utilized During Treatment: Gait belt Activity Tolerance: Treatment limited secondary to medical complications (Comment)(dizziness) Patient left: in chair;with call bell/phone within reach;with family/visitor present Nurse Communication: Mobility status;Other (comment)(Pt IV beeping) PT Visit Diagnosis: Other abnormalities of gait and mobility (R26.89);Muscle weakness (generalized) (M62.81)    Time: 9562-1308 PT Time Calculation (min) (ACUTE ONLY): 30 min   Charges:   PT Evaluation $PT Eval Low Complexity: 1 Low PT Treatments $Therapeutic Activity: 8-22 mins        Cindee Salt, DPT  Acute Rehabilitation Services  Pager: [phone] Office: [phone]   Lehman Prom 07/26/2019, 1:46 PM

## 2019-07-26 NOTE — Progress Notes (Addendum)
[facility] Progress Note  1 Day Post-Op  Subjective: CC:  Pain currently controlled - just received IV HM. Reports small amt flatus today, no BM. Foley in place. Has not been OOB yet.  We discussed that her hernia was repaired without mesh and that it would likely recur. We discussed that if she has a recurrent hernia she should consider weight loss and undergo an operation at a time when she is not acutely obstructed.   Objective: Vital signs in last 24 hours: Temp:  [97.9 F (36.6 C)-98.9 F (37.2 C)] 98.9 F (37.2 C) (03/26 0515) Pulse Rate:  [62-100] 93 (03/26 0515) Resp:  [14-18] 18 (03/26 0515) BP: (121-152)/(53-85) 121/66 (03/26 0515) SpO2:  [93 %-100 %] 97 % (03/26 0515) Weight:  [425 kg] 117 kg (03/25 1018) Last BM Date: 07/24/19  Intake/Output from previous day: 03/25 0701 - 03/26 0700 In: 1342 [I.V.:1342] Out: 1210 [Urine:1200; Blood:10] Intake/Output this shift: No intake/output data recorded.  PE: Gen:  Alert, NAD, pleasant Pulm:  Normal effort,  Abd: Soft, appropriately tender, abdominal binder in place, midline incision with staples and covered with honeycomb - scant dried sanguinous drainage on dressing, non-distended  NGT removed Skin: warm and dry, no rashes  Psych: A&Ox3   Lab Results:  Recent Labs    07/25/19 1026 07/26/19 0301  WBC 5.4 9.9  HGB 12.0 12.1  HCT 37.3 37.0  PLT 270 272   BMET Recent Labs    07/25/19 1026 07/26/19 0301  NA 139 139  K 3.5 3.5  CL 100 102  CO2 30 26  GLUCOSE 117* 143*  BUN 7* 8  CREATININE 0.74 0.77  CALCIUM 9.3 8.9   PT/INR No results for input(s): LABPROT, INR in the last 72 hours. CMP     Component Value Date/Time   NA 139 07/26/2019 0301   NA 140 07/18/2019 1003   K 3.5 07/26/2019 0301   CL 102 07/26/2019 0301   CO2 26 07/26/2019 0301   GLUCOSE 143 (H) 07/26/2019 0301   BUN 8 07/26/2019 0301   BUN 17 07/18/2019 1003   CREATININE 0.77 07/26/2019 0301   CALCIUM 8.9 07/26/2019  0301   PROT 7.1 07/25/2019 1026   PROT 7.3 07/18/2019 1003   ALBUMIN 3.8 07/25/2019 1026   ALBUMIN 4.6 07/18/2019 1003   AST 17 07/25/2019 1026   ALT 15 07/25/2019 1026   ALKPHOS 66 07/25/2019 1026   BILITOT 0.8 07/25/2019 1026   BILITOT 0.4 07/18/2019 1003   GFRNONAA >60 07/26/2019 0301   GFRAA >60 07/26/2019 0301   Lipase     Component Value Date/Time   LIPASE 22 07/25/2019 1026       Studies/Results: CT Abdomen Pelvis W Contrast  Result Date: 07/25/2019 CLINICAL DATA:  Acute generalized abdominal pain. EXAM: CT ABDOMEN AND PELVIS WITH CONTRAST TECHNIQUE: Multidetector CT imaging of the abdomen and pelvis was performed using the standard protocol following bolus administration of intravenous contrast. CONTRAST:  OMNIPAQUE IOHEXOL 300 MG/ML  SOLN COMPARISON:  February 13, 2019. FINDINGS: Lower chest: Mild sliding-type hiatal hernia. Visualized lung bases are unremarkable. Hepatobiliary: No focal liver abnormality is seen. No gallstones, gallbladder wall thickening, or biliary dilatation. Pancreas: Unremarkable. No pancreatic ductal dilatation or surrounding inflammatory changes. Spleen: Normal in size without focal abnormality. Adrenals/Urinary Tract: Adrenal glands appear normal. Small right renal cyst is noted. No hydronephrosis or renal obstruction is noted. No renal or ureteral calculi is noted. Urinary bladder is unremarkable. Stomach/Bowel: The stomach appears normal. The appendix  is not visualized. Moderate size periumbilical hernia is noted which contains a loop of small bowel, but does not appear to be resulting in obstruction. However, larger infraumbilical hernia is noted which does contain loop of small bowel as well, and does appear to be resulting in dilatation and obstruction of the more proximal small bowel. No colonic dilatation is noted. Vascular/Lymphatic: Aortic atherosclerosis. No enlarged abdominal or pelvic lymph nodes. Reproductive: Calcified uterine fibroid is  noted. No adnexal abnormality is noted. Other: No ascites is noted. Musculoskeletal: No acute or significant osseous findings. IMPRESSION: 1. Moderate size periumbilical hernia is noted which contains a loop of small bowel, but does not appear to be resulting in obstruction. However, larger infraumbilical hernia is noted which does contain loop of small bowel as well, and does appear to be resulting in dilatation and obstruction of the more proximal small bowel. 2. Mild sliding-type hiatal hernia. 3. Calcified uterine fibroid. Aortic Atherosclerosis (ICD10-I70.0). Electronically Signed   By: Marijo Conception M.D.   On: 07/25/2019 15:44    Anti-infectives: Anti-infectives (From admission, onward)   Start     Dose/Rate Route Frequency Ordered Stop   07/26/19 0600  ceFAZolin (ANCEF) IVPB 2g/100 mL premix     2 g 200 mL/hr over 30 Minutes Intravenous On call to O.R. 07/25/19 1635 07/26/19 0539     Assessment/Plan Incarcerated incisional hernia with sbo POD#1 s/p ex lap primary repair recurrent incisional hernia  - AFVSS, WBC 9.9 - NGT with minimal clear output - removed today - D/C Foley - OOB, PT/OT evals - IS 10x q 1h  - K 3.5 give 40 mEq IV KCl, check BMP and Mg in AM  FEN: NPO,  IVF (change to D5 1/2 NS w/ 20 KCl), ice chips, AROBF ID: none VTE: SCD's, Lovenox Foley: removed today POD#1 Follow up: Dr. Dema Severin    LOS: 1 day    Obie Dredge, [facility] Pager: [phone]

## 2019-07-27 LAB — BASIC METABOLIC PANEL
Anion gap: 7 (ref 5–15)
BUN: 7 mg/dL — ABNORMAL LOW (ref 8–23)
CO2: 29 mmol/L (ref 22–32)
Calcium: 8.6 mg/dL — ABNORMAL LOW (ref 8.9–10.3)
Chloride: 106 mmol/L (ref 98–111)
Creatinine, Ser: 0.69 mg/dL (ref 0.44–1.00)
GFR calc Af Amer: >60 mL/min (ref >60)
GFR calc non Af Amer: >60 mL/min (ref >60)
Glucose, Bld: 127 mg/dL — ABNORMAL HIGH (ref 70–99)
Potassium: 3.6 mmol/L (ref 3.5–5.1)
Sodium: 142 mmol/L (ref 135–145)

## 2019-07-27 LAB — GLUCOSE, CAPILLARY
Glucose-Capillary: 108 mg/dL — ABNORMAL HIGH (ref 70–99)
Glucose-Capillary: 99 mg/dL (ref 70–99)
Glucose-Capillary: 83 mg/dL (ref 70–99)
Glucose-Capillary: 136 mg/dL — ABNORMAL HIGH (ref 70–99)

## 2019-07-27 LAB — MAGNESIUM: Magnesium: 1.6 mg/dL — ABNORMAL LOW (ref 1.7–2.4)

## 2019-07-27 NOTE — Progress Notes (Signed)
[facility]:  [phone] General Surgery Progress Note   LOS: 2 days  POD -  2 Days Post-Op  Chief Complaint: Abdominal pain  Assessment and Plan: 1.   EXPLORATORY LAPAROTOMY,  Primary INCISIONAL HENIA REPAIR - 07/25/2019 - White  Passing flatus  To start clear liquids  2.  DVT prophylaxis - Lovenox  Dopplers - 3/26 - negative 3.  Morbidly obese   Active Problems:   SBO (small bowel obstruction) (HCC)  Subjective:  Passing flatus.  Walking in the hall.  Daughter, Poet Hineman, at bedside.  Objective:   Vitals:   07/26/19 2025 07/27/19 0521  BP: (!) 127/57 (!) 135/57  Pulse: 75 62  Resp:    Temp: 98.3 F (36.8 C) (!) 97.3 F (36.3 C)  SpO2: 100% 100%     Intake/Output from previous day:  03/26 0701 - 03/27 0700 In: 1001 [I.V.:488; IV Piggyback:513] Out: 950 [Urine:950]  Intake/Output this shift:  Total I/O In: 668.9 [I.V.:668.9] Out: 300 [Urine:300]   Physical Exam:   General: Obese AA F who is alert and oriented.    HEENT: Normal. Pupils equal. .   Lungs: Clear.  IS = 1,200 cc   Abdomen: Soft.  BS present   Wound: Wound looks good.  In abdominal binder.   Lab Results:    Recent Labs    07/25/19 1026 07/26/19 0301  WBC 5.4 9.9  HGB 12.0 12.1  HCT 37.3 37.0  PLT 270 272    BMET   Recent Labs    07/25/19 1026 07/26/19 0301  NA 139 139  K 3.5 3.5  CL 100 102  CO2 30 26  GLUCOSE 117* 143*  BUN 7* 8  CREATININE 0.74 0.77  CALCIUM 9.3 8.9    PT/INR  No results for input(s): LABPROT, INR in the last 72 hours.  ABG  No results for input(s): PHART, HCO3 in the last 72 hours.  Invalid input(s): PCO2, PO2   Studies/Results:  CT Abdomen Pelvis W Contrast  Result Date: 07/25/2019 CLINICAL DATA:  Acute generalized abdominal pain. EXAM: CT ABDOMEN AND PELVIS WITH CONTRAST TECHNIQUE: Multidetector CT imaging of the abdomen and pelvis was performed using the standard protocol following bolus administration of intravenous  contrast. CONTRAST:  OMNIPAQUE IOHEXOL 300 MG/ML  SOLN COMPARISON:  February 13, 2019. FINDINGS: Lower chest: Mild sliding-type hiatal hernia. Visualized lung bases are unremarkable. Hepatobiliary: No focal liver abnormality is seen. No gallstones, gallbladder wall thickening, or biliary dilatation. Pancreas: Unremarkable. No pancreatic ductal dilatation or surrounding inflammatory changes. Spleen: Normal in size without focal abnormality. Adrenals/Urinary Tract: Adrenal glands appear normal. Small right renal cyst is noted. No hydronephrosis or renal obstruction is noted. No renal or ureteral calculi is noted. Urinary bladder is unremarkable. Stomach/Bowel: The stomach appears normal. The appendix is not visualized. Moderate size periumbilical hernia is noted which contains a loop of small bowel, but does not appear to be resulting in obstruction. However, larger infraumbilical hernia is noted which does contain loop of small bowel as well, and does appear to be resulting in dilatation and obstruction of the more proximal small bowel. No colonic dilatation is noted. Vascular/Lymphatic: Aortic atherosclerosis. No enlarged abdominal or pelvic lymph nodes. Reproductive: Calcified uterine fibroid is noted. No adnexal abnormality is noted. Other: No ascites is noted. Musculoskeletal: No acute or significant osseous findings. IMPRESSION: 1. Moderate size periumbilical hernia is noted which contains a loop of small bowel, but does not appear to be resulting in obstruction. However, larger infraumbilical  hernia is noted which does contain loop of small bowel as well, and does appear to be resulting in dilatation and obstruction of the more proximal small bowel. 2. Mild sliding-type hiatal hernia. 3. Calcified uterine fibroid. Aortic Atherosclerosis (ICD10-I70.0). Electronically Signed   By: Marijo Conception M.D.   On: 07/25/2019 15:44   VAS [place] LOWER EXTREMITY VENOUS (DVT)  Result Date: 07/26/2019  Lower Venous  DVTStudy Indications: Screening. High risk prior history s/p ELAP.  Limitations: Poor ultrasound/tissue interface and body habitus. Comparison Study: 06/21/2016- negative lower extremity venous duplex Performing Technologist: Maudry Mayhew MHA, RDMS, RVT, RDCS  Examination Guidelines: A complete evaluation includes B-mode imaging, spectral Doppler, color Doppler, and power Doppler as needed of all accessible portions of each vessel. Bilateral testing is considered an integral part of a complete examination. Limited examinations for reoccurring indications may be performed as noted. The reflux portion of the exam is performed with the patient in reverse Trendelenburg.  +---------+---------------+---------+-----------+----------+--------------+ RIGHT    CompressibilityPhasicitySpontaneityPropertiesThrombus Aging +---------+---------------+---------+-----------+----------+--------------+ CFV      Full           Yes      Yes                                 +---------+---------------+---------+-----------+----------+--------------+ SFJ      Full                                                        +---------+---------------+---------+-----------+----------+--------------+ FV Prox  Full                                                        +---------+---------------+---------+-----------+----------+--------------+ FV Mid   Full                                                        +---------+---------------+---------+-----------+----------+--------------+ FV DistalFull                                                        +---------+---------------+---------+-----------+----------+--------------+ PFV      Full                                                        +---------+---------------+---------+-----------+----------+--------------+ POP      Full           Yes      Yes                                  +---------+---------------+---------+-----------+----------+--------------+ PTV      Full                                                        +---------+---------------+---------+-----------+----------+--------------+  PERO     Full                                                        +---------+---------------+---------+-----------+----------+--------------+   +---------+---------------+---------+-----------+----------+--------------+ LEFT     CompressibilityPhasicitySpontaneityPropertiesThrombus Aging +---------+---------------+---------+-----------+----------+--------------+ CFV      Full           Yes      Yes                                 +---------+---------------+---------+-----------+----------+--------------+ SFJ      Full                                                        +---------+---------------+---------+-----------+----------+--------------+ FV Prox  Full                                                        +---------+---------------+---------+-----------+----------+--------------+ FV Mid   Full                                                        +---------+---------------+---------+-----------+----------+--------------+ FV DistalFull                                                        +---------+---------------+---------+-----------+----------+--------------+ PFV      Full                                                        +---------+---------------+---------+-----------+----------+--------------+ POP      Full           Yes      Yes                                 +---------+---------------+---------+-----------+----------+--------------+ PTV      Full                                                        +---------+---------------+---------+-----------+----------+--------------+ PERO     Full                                                         +---------+---------------+---------+-----------+----------+--------------+  Summary: RIGHT: - There is no evidence of deep vein thrombosis in the lower extremity. However, portions of this examination were limited- see technologist comments above.  - No cystic structure found in the popliteal fossa.  LEFT: - There is no evidence of deep vein thrombosis in the lower extremity. However, portions of this examination were limited- see technologist comments above.  - No cystic structure found in the popliteal fossa.  *See table(s) above for measurements and observations. Electronically signed by Waverly Ferrari MD on 07/26/2019 at 4:50:30 PM.    Final      Anti-infectives:   Anti-infectives (From admission, onward)   Start     Dose/Rate Route Frequency Ordered Stop   07/26/19 0600  ceFAZolin (ANCEF) IVPB 2g/100 mL premix     2 g 200 mL/hr over 30 Minutes Intravenous On call to O.R. 07/25/19 1635 07/26/19 0539      Ovidio Kin, MD, [facility] Surgery Office: [phone] 07/27/2019

## 2019-07-27 NOTE — Progress Notes (Signed)
Physical Therapy Treatment Patient Details Name: Rhonda [name] MRN: [id number] DOB: 08-Sep-1950 Today's Date: 07/27/2019    History of Present Illness Pt is a 69 y/o female admitted secondary to increased abdominal pain. Found to have SBO and is s/p hernia repair. PMH includes HTN, anemia, and s/p lumbar surgery.     PT Comments    Pt tolerated treatment well, denies dizziness during this session. Pt ambulating for community distances, transferring, and performing bed mobility without physical assistance. Pt also able to perform toileting and ADLs at sink without assist. Pt will continue to benefit from aggressive mobilization and is encouraged to ambulate out of the room at least 3 times per day with assistance of staff or family.   Follow Up Recommendations  Home health PT;Supervision for mobility/OOB     Equipment Recommendations  3in1 (PT)    Recommendations for Other Services       Precautions / Restrictions Precautions Precautions: Fall Restrictions Weight Bearing Restrictions: No    Mobility  Bed Mobility Overal bed mobility: Needs Assistance Bed Mobility: Rolling;Sidelying to Sit;Sit to Supine Rolling: Supervision Sidelying to sit: Supervision   Sit to supine: Supervision      Transfers Overall transfer level: Needs assistance Equipment used: Rolling walker (2 wheeled) Transfers: Sit to/from Stand Sit to Stand: Supervision         General transfer comment: supervision from bed, modI from 3 in 1 commode  Ambulation/Gait Ambulation/Gait assistance: Modified independent (Device/Increase time) Gait Distance (Feet): 400 Feet Assistive device: Rolling walker (2 wheeled) Gait Pattern/deviations: Step-through pattern Gait velocity: functional Gait velocity interpretation: 1.31 - 2.62 ft/sec, indicative of limited community ambulator General Gait Details: pt with slowed step through gait   Stairs             Wheelchair Mobility    Modified  Rankin (Stroke Patients Only)       Balance Overall balance assessment: Needs assistance Sitting-balance support: No upper extremity supported;Feet supported Sitting balance-Leahy Scale: Good Sitting balance - Comments: independent for static sitting balance   Standing balance support: No upper extremity supported;During functional activity Standing balance-Leahy Scale: Good Standing balance comment: supervision for hand washing at sink                            Cognition Arousal/Alertness: Awake/alert Behavior During Therapy: WFL for tasks assessed/performed Overall Cognitive Status: Within Functional Limits for tasks assessed                                        Exercises      General Comments General comments (skin integrity, edema, etc.): pt's daughter present, pt and dtr excited to potentially discharge soon and happy with pt's mobility      Pertinent Vitals/Pain Pain Assessment: Faces Faces Pain Scale: Hurts even more Pain Location: abdomen Pain Descriptors / Indicators: Grimacing;Guarding Pain Intervention(s): Limited activity within patient's tolerance    Home Living                      Prior Function            PT Goals (current goals can now be found in the care plan section) Acute Rehab PT Goals Patient Stated Goal: to go home Progress towards PT goals: Progressing toward goals    Frequency    Min 3X/week  PT Plan Current plan remains appropriate    Co-evaluation              AM-PAC PT "6 Clicks" Mobility   Outcome Measure  Help needed turning from your back to your side while in a flat bed without using bedrails?: None Help needed moving from lying on your back to sitting on the side of a flat bed without using bedrails?: None Help needed moving to and from a bed to a chair (including a wheelchair)?: None Help needed standing up from a chair using your arms (e.g., wheelchair or bedside  chair)?: None Help needed to walk in hospital room?: None Help needed climbing 3-5 steps with a railing? : A Little 6 Click Score: 23    End of Session   Activity Tolerance: Patient tolerated treatment well Patient left: in bed;with call bell/phone within reach;with family/visitor present Nurse Communication: Mobility status PT Visit Diagnosis: Other abnormalities of gait and mobility (R26.89);Muscle weakness (generalized) (M62.81)     Time: 1017-1040 PT Time Calculation (min) (ACUTE ONLY): 23 min  Charges:  $Gait Training: 8-22 mins $Therapeutic Activity: 8-22 mins                     Zenaida Niece, PT, DPT Acute Rehabilitation Pager: [phone]    Zenaida Niece 07/27/2019, 12:37 PM

## 2019-07-28 LAB — GLUCOSE, CAPILLARY
Glucose-Capillary: 101 mg/dL — ABNORMAL HIGH (ref 70–99)
Glucose-Capillary: 111 mg/dL — ABNORMAL HIGH (ref 70–99)

## 2019-07-28 MED ORDER — TRAMADOL HCL 50 MG PO TABS: 50.0000 mg | ORAL_TABLET | Freq: Four times a day (QID) | ORAL | 0 refills | Status: DC | PRN

## 2019-07-28 NOTE — Progress Notes (Signed)
[facility]:  [phone] General Surgery Progress Note   LOS: 3 days  POD -  3 Days Post-Op  Chief Complaint: Abdominal pain  Assessment and Plan: 1.   EXPLORATORY LAPAROTOMY,  Primary INCISIONAL HENIA REPAIR - 07/25/2019 - White  Passing flatus and bowel movements  Soft diet now  2.  DVT prophylaxis - Lovenox  Dopplers - 3/26 - negative 3.  Morbidly obese  Plan discharge today if tolerating soft diet at lunch  Active Problems:   SBO (small bowel obstruction) (HCC)  Subjective:  Did well with clears. + BM.   Objective:   Vitals:   07/27/19 2018 07/28/19 0528  BP: (!) 129/51 (!) 135/59  Pulse: 74 70  Resp: 17 17  Temp: 97.9 F (36.6 C) 98.9 F (37.2 C)  SpO2: 100% 100%     Intake/Output from previous day:  03/27 0701 - 03/28 0700 In: 2478.5 [P.O.:440; I.V.:1938.5; IV Piggyback:100] Out: 700 [Urine:700]  Intake/Output this shift:  Total I/O In: 120 [P.O.:120] Out: -    Physical Exam:   General: Obese AA F who is alert and oriented.    HEENT: Normal. Pupils equal. .   Lungs: Clear.    Abdomen: Soft.   Wound: Wound looks good.  In abdominal binder.   Lab Results:    Recent Labs    07/26/19 0301  WBC 9.9  HGB 12.1  HCT 37.0  PLT 272    BMET   Recent Labs    07/26/19 0301 07/27/19 1057  NA 139 142  K 3.5 3.6  CL 102 106  CO2 26 29  GLUCOSE 143* 127*  BUN 8 7*  CREATININE 0.77 0.69  CALCIUM 8.9 8.6*    PT/INR  No results for input(s): LABPROT, INR in the last 72 hours.  ABG  No results for input(s): PHART, HCO3 in the last 72 hours.  Invalid input(s): PCO2, PO2   Studies/Results:  VAS [place] LOWER EXTREMITY VENOUS (DVT)  Result Date: 07/26/2019  Lower Venous DVTStudy Indications: Screening. High risk prior history s/p ELAP.  Limitations: Poor ultrasound/tissue interface and body habitus. Comparison Study: 06/21/2016- negative lower extremity venous duplex Performing Technologist: Gertie Fey MHA, RDMS, RVT,  RDCS  Examination Guidelines: A complete evaluation includes B-mode imaging, spectral Doppler, color Doppler, and power Doppler as needed of all accessible portions of each vessel. Bilateral testing is considered an integral part of a complete examination. Limited examinations for reoccurring indications may be performed as noted. The reflux portion of the exam is performed with the patient in reverse Trendelenburg.  +---------+---------------+---------+-----------+----------+--------------+ RIGHT    CompressibilityPhasicitySpontaneityPropertiesThrombus Aging +---------+---------------+---------+-----------+----------+--------------+ CFV      Full           Yes      Yes                                 +---------+---------------+---------+-----------+----------+--------------+ SFJ      Full                                                        +---------+---------------+---------+-----------+----------+--------------+ FV Prox  Full                                                        +---------+---------------+---------+-----------+----------+--------------+  FV Mid   Full                                                        +---------+---------------+---------+-----------+----------+--------------+ FV DistalFull                                                        +---------+---------------+---------+-----------+----------+--------------+ PFV      Full                                                        +---------+---------------+---------+-----------+----------+--------------+ POP      Full           Yes      Yes                                 +---------+---------------+---------+-----------+----------+--------------+ PTV      Full                                                        +---------+---------------+---------+-----------+----------+--------------+ PERO     Full                                                         +---------+---------------+---------+-----------+----------+--------------+   +---------+---------------+---------+-----------+----------+--------------+ LEFT     CompressibilityPhasicitySpontaneityPropertiesThrombus Aging +---------+---------------+---------+-----------+----------+--------------+ CFV      Full           Yes      Yes                                 +---------+---------------+---------+-----------+----------+--------------+ SFJ      Full                                                        +---------+---------------+---------+-----------+----------+--------------+ FV Prox  Full                                                        +---------+---------------+---------+-----------+----------+--------------+ FV Mid   Full                                                        +---------+---------------+---------+-----------+----------+--------------+  FV DistalFull                                                        +---------+---------------+---------+-----------+----------+--------------+ PFV      Full                                                        +---------+---------------+---------+-----------+----------+--------------+ POP      Full           Yes      Yes                                 +---------+---------------+---------+-----------+----------+--------------+ PTV      Full                                                        +---------+---------------+---------+-----------+----------+--------------+ PERO     Full                                                        +---------+---------------+---------+-----------+----------+--------------+     Summary: RIGHT: - There is no evidence of deep vein thrombosis in the lower extremity. However, portions of this examination were limited- see technologist comments above.  - No cystic structure found in the popliteal fossa.  LEFT: - There is no evidence of deep vein  thrombosis in the lower extremity. However, portions of this examination were limited- see technologist comments above.  - No cystic structure found in the popliteal fossa.  *See table(s) above for measurements and observations. Electronically signed by Deitra Mayo MD on 07/26/2019 at 4:50:30 PM.    Final      Anti-infectives:   Anti-infectives (From admission, onward)   Start     Dose/Rate Route Frequency Ordered Stop   07/26/19 0600  ceFAZolin (ANCEF) IVPB 2g/100 mL premix     2 g 200 mL/hr over 30 Minutes Intravenous On call to O.R. 07/25/19 1635 07/26/19 [place] MD, [facility] Surgery Office: [phone] 07/28/2019

## 2019-07-28 NOTE — Discharge Summary (Signed)
Physician Discharge Summary  Patient ID: Rhonda [name] MRN: [id number] DOB/AGE: [birth year] 69 y.o.  Admit date: 07/25/2019 Discharge date: 07/28/2019  Admission Diagnoses: Incarcerated recurrent incisional hernia, small bowel obstruction  Discharge Diagnoses:  Same  Discharged Condition: good  Hospital Course: She underwent an emergency exploratory laparotomy with primary repair of recurrent incisional hernia on 07/25/2019 by Dr. Cliffton Asters.  Over the following days, her diet was advanced and her mobility gradually improved.  By the morning of postop day 3 she was tolerating a soft diet, ambulating in the halls, and her pain was well controlled with oral medications.  Discharge Exam: Blood pressure (!) 135/59, pulse 70, temperature 98.9 F (37.2 C), temperature source Oral, resp. rate 17, height 5\' 3"  (1.6 m), weight 117 kg, SpO2 100 %. See rounding note  Disposition: Discharge disposition: 01-Home or Self Care        Allergies as of 07/28/2019      Reactions   Aspirin-acetaminophen-caffeine Nausea And Vomiting   Codeine Diarrhea, Nausea And Vomiting   Stomach cramps   Oxycontin [oxycodone Hcl] Other (See Comments)   Hallucination      Medication List    STOP taking these medications   diclofenac Sodium 1 % Gel Commonly known as: VOLTAREN     TAKE these medications   albuterol 108 (90 Base) MCG/ACT inhaler Commonly known as: ProAir HFA Inhale 2 puffs into the lungs every 6 (six) hours as needed for wheezing or shortness of breath.   atorvastatin 10 MG tablet Commonly known as: LIPITOR Take 1 tablet by mouth once daily   dorzolamide-timolol 22.3-6.8 MG/ML ophthalmic solution Commonly known as: COSOPT Place 1 drop into both eyes 2 (two) times daily.   esomeprazole 20 MG capsule Commonly known as: NEXIUM Take 20 mg by mouth daily at 12 noon.   fluticasone 50 MCG/ACT nasal spray Commonly known as: FLONASE Use 1 spray(s) in each nostril once daily What changed:  See the new instructions.   hydrochlorothiazide 25 MG tablet Commonly known as: HYDRODIURIL Take 1 tablet by mouth once daily   levocetirizine 5 MG tablet Commonly known as: XYZAL TAKE 1 TABLET BY MOUTH ONCE DAILY IN THE EVENING   losartan 100 MG tablet Commonly known as: COZAAR Take 1 tablet by mouth once daily   multivitamin with minerals Tabs tablet Take 1 tablet by mouth daily.   potassium chloride 10 MEQ tablet Commonly known as: KLOR-CON Take 1 tablet by mouth once daily   traMADol 50 MG tablet Commonly known as: ULTRAM Take 1 tablet (50 mg total) by mouth every 6 (six) hours as needed for moderate pain or severe pain (50mg  for moderate pain, 100mg  for severe pain).   VITAMIN D-3 PO Take 1 capsule by mouth daily.      Follow-up Information    [facility] 07/30/2019. Schedule an appointment as soon as possible for a visit.   Specialty: General Surgery Why: Call on Monday to arrange appointment for staple removal  around 3/8 Contact information: [address] [place] [address] [phone]        [place], MD. Schedule an appointment as soon as possible for a visit.   Specialty: General Surgery Why: Schedule an appointment with Dr. [id number] in about 3-4 weeks Contact information: [address] [place] Cliffton Asters [address] [phone]           Signed: [place] 07/28/2019, 10:40 AM

## 2019-07-28 NOTE — Discharge Instructions (Signed)
[facility]      [facility] [phone]  OPEN ABDOMINAL SURGERY: POST OP INSTRUCTIONS  Always review your discharge instruction sheet given to you by the facility where your surgery was performed.  IF YOU HAVE DISABILITY OR FAMILY LEAVE FORMS, YOU MUST BRING THEM TO THE OFFICE FOR PROCESSING.  PLEASE DO NOT GIVE THEM TO YOUR DOCTOR.  1. A prescription for pain medication may be given to you upon discharge.  Take your pain medication as prescribed, if needed.  If narcotic pain medicine is not needed, then you may take acetaminophen (Tylenol) or ibuprofen (Advil) as needed. 2. Take your usually prescribed medications unless otherwise directed. 3. If you need a refill on your pain medication, please contact your pharmacy. They will contact our office to request authorization.  Prescriptions will not be filled after 5pm or on week-ends. 4. You should follow a light diet the first few days after arrival home, such as soup and crackers, pudding, etc.unless your doctor has advised otherwise. A high-fiber, low fat diet can be resumed as tolerated.   Be sure to include lots of fluids daily. Most patients will experience some swelling and bruising on the chest and neck area.  Ice packs will help.  Swelling and bruising can take several days to resolve 5. Most patients will experience some swelling and bruising in the area of the incision. Ice pack will help. Swelling and bruising can take several days to resolve..  6. It is common to experience some constipation if taking pain medication after surgery.  Increasing fluid intake and taking a stool softener will usually help or prevent this problem from occurring.  A mild laxative (Milk of Magnesia or Miralax) should be taken according to package directions if there are no bowel movements after 48 hours. 7.  You may have steri-strips (small skin tapes) in place directly over the incision.  These strips should be left on the skin for 7-10 days.  If your  surgeon used skin glue on the incision, you may shower in 24 hours.  The glue will flake off over the next 2-3 weeks.  Any sutures or staples will be removed at the office during your follow-up visit. You may find that a light gauze bandage over your incision may keep your staples from being rubbed or pulled. You may shower and replace the bandage daily. 8. ACTIVITIES:  You may resume regular (light) daily activities beginning the next day--such as daily self-care, walking, climbing stairs--gradually increasing activities as tolerated.  You may have sexual intercourse when it is comfortable.  Refrain from any heavy lifting or straining until approved by your doctor. a. You may drive when you no longer are taking prescription pain medication, you can comfortably wear a seatbelt, and you can safely maneuver your car and apply brakes b. Return to Work: ___________________________________ 9. You should see your doctor in the office for a follow-up appointment approximately two weeks after your surgery.  Make sure that you call for this appointment within a day or two after you arrive home to insure a convenient appointment time. OTHER INSTRUCTIONS:  _____________________________________________________________ _____________________________________________________________  WHEN TO CALL YOUR DOCTOR: 1. Fever over 101.0 2. Inability to urinate 3. Nausea and/or vomiting 4. Extreme swelling or bruising 5. Continued bleeding from incision. 6. Increased pain, redness, or drainage from the incision. 7. Difficulty swallowing or breathing 8. Muscle cramping or spasms. 9. Numbness or tingling in hands or feet or around lips.  The clinic staff is available to   answer your questions during regular business hours.  Please don't hesitate to call and ask to speak to one of the nurses if you have concerns.  For further questions, please visit [email or web address]   

## 2019-07-28 NOTE — Progress Notes (Signed)
Discharge instructions reviewed with pt and pt's daughter and instructed on where to pick up prescription. Pt and pt's daughter verbalized understanding and had no questions.  Pt discharged in stable condition via wheelchair with daughter.  Zandria Woldt Lindsay  

## 2019-07-28 NOTE — Evaluation (Signed)
Occupational Therapy Evaluation Patient Details Name: Rhonda [name] MRN: [id number] DOB: [birth year] Today's Date: 07/28/2019    History of Present Illness Pt is a 69 y/o female admitted secondary to increased abdominal pain. Found to have SBO and is s/p hernia repair. PMH includes HTN, anemia, and s/p lumbar surgery.    Clinical Impression   Pt admitted with the above diagnosis.  She presents to OT with the below deficits.  She currently is able to perform ADL at supervision level using RW.  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  Recommend 3in1 commode for home use as pt's commode is low to ground and she has no grab bars.  All OT needs addressed, OT will sign off.     Follow Up Recommendations  No OT follow up;Supervision - Intermittent    Equipment Recommendations  3 in 1 bedside commode    Recommendations for Other Services       Precautions / Restrictions Precautions Precautions: Fall      Mobility Bed Mobility Overal bed mobility: Needs Assistance Bed Mobility: Sit to Supine;Supine to Sit     Supine to sit: Supervision Sit to supine: Supervision      Transfers Overall transfer level: Needs assistance Equipment used: Rolling walker (2 wheeled) Transfers: Sit to/from [facility] Sit to Stand: Supervision Stand pivot transfers: Supervision            Balance Overall balance assessment: Needs assistance Sitting-balance support: No upper extremity supported;Feet supported Sitting balance-Leahy Scale: Good     Standing balance support: No upper extremity supported Standing balance-Leahy Scale: Fair Standing balance comment: able to maintain static standing with supervision                            ADL either performed or assessed with clinical judgement   ADL Overall ADL's : Needs assistance/impaired Eating/Feeding:  Independent;Sitting   Grooming: Wash/dry hands;Wash/dry face;Oral care;Supervision/safety;Standing   Upper Body Bathing: Set up;Sitting   Lower Body Bathing: Supervison/ safety;Sit to/from stand Lower Body Bathing Details (indicate cue type and reason): Pt able to access feet  Upper Body Dressing : Set up;Sitting   Lower Body Dressing: Supervision/safety;Sit to/from stand Lower Body Dressing Details (indicate cue type and reason): able to don/doff socks and able to stand with supervision  Toilet Transfer: Supervision/safety;Ambulation;Comfort height toilet;Grab bars;RW Armed forces technical officer Details (indicate cue type and reason): discussed use of 3in1 for home use as her commode is low  Toileting- Water quality scientist and Hygiene: Supervision/safety;Sit to/from stand       Functional mobility during ADLs: Supervision/safety;Rolling walker General ADL Comments: Daughter is available to assist as needed      Vision Patient Visual Report: No change from baseline       Perception     Praxis      Pertinent Vitals/Pain Pain Assessment: 0-10 Pain Score: 4  Pain Location: abdomen Pain Descriptors / Indicators: Guarding Pain Intervention(s): Premedicated before session;Monitored during session     Hand Dominance Right   Extremity/Trunk Assessment Upper Extremity Assessment Upper Extremity Assessment: Overall WFL for tasks assessed   Lower Extremity Assessment Lower Extremity Assessment: Defer to PT evaluation   Cervical / Trunk Assessment Cervical / Trunk Assessment: Normal   Communication Communication Communication: No difficulties   Cognition Arousal/Alertness: Awake/alert Behavior During Therapy: WFL for tasks assessed/performed Overall Cognitive Status: Within Functional Limits for  tasks assessed                                     General Comments  Pt is hopeful and eager for discharge today     Exercises     Shoulder Instructions      Home  Living Family/patient expects to be discharged to:: Private residence Living Arrangements: Parent;Children Available Help at Discharge: Family;Available 24 hours/day Type of Home: House Home Access: Stairs to enter [employer] of Steps: 4 Entrance Stairs-Rails: Right;Left;Can reach both Home Layout: Two level;Able to live on main level with bedroom/bathroom     Bathroom Shower/Tub: Producer, television/film/video: Standard     Home Equipment: Environmental consultant - 2 wheels;Shower seat;Hand held shower head;Grab bars - tub/shower   Additional Comments: Aide there for her mom 1.5 hours M-F; 1 hour at night W-F; Saturdays 2 hours during the day and 1 hour at night.       Prior Functioning/Environment Level of Independence: Independent        Comments: Cares for 54 y/o mother        OT Problem List: Decreased activity tolerance;Decreased strength;Decreased knowledge of precautions;Pain      OT Treatment/Interventions:      OT Goals(Current goals can be found in the care plan section) Acute Rehab OT Goals Patient Stated Goal: to go home OT Goal Formulation: All assessment and education complete, DC therapy  OT Frequency:     Barriers to D/C:            Co-evaluation              AM-PAC OT "6 Clicks" Daily Activity     Outcome Measure Help from another person eating meals?: None Help from another person taking care of personal grooming?: A Little Help from another person toileting, which includes using toliet, bedpan, or urinal?: A Little Help from another person bathing (including washing, rinsing, drying)?: A Little Help from another person to put on and taking off regular upper body clothing?: A Little Help from another person to put on and taking off regular lower body clothing?: A Little 6 Click Score: 19   End of Session Equipment Utilized During Treatment: Rolling walker Nurse Communication: Mobility status  Activity Tolerance: Patient tolerated  treatment well Patient left: in bed;with call bell/phone within reach  OT Visit Diagnosis: Pain Pain - part of body: (abdomen )                Time: 1308-6578 OT Time Calculation (min): 25 min Charges:  OT General Charges $OT Visit: 1 Visit OT Evaluation $OT Eval Moderate Complexity: 1 Mod OT Treatments $Therapeutic Activity: 8-22 mins  Eber Jones., OTR/L Acute Rehabilitation Services Pager [phone] Office [phone]   Jeani Hawking M 07/28/2019, 11:29 AM

## 2019-07-28 NOTE — Care Management (Signed)
3n1 ordered for delivery to room prior to d/c.

## 2019-07-31 ENCOUNTER — Encounter: Payer: Self-pay | Admitting: General Practice

## 2019-08-10 ENCOUNTER — Other Ambulatory Visit: Payer: Self-pay | Admitting: Internal Medicine

## 2019-08-14 ENCOUNTER — Other Ambulatory Visit: Payer: Self-pay | Admitting: Internal Medicine

## 2019-09-12 ENCOUNTER — Other Ambulatory Visit: Payer: Self-pay | Admitting: Internal Medicine

## 2019-10-01 ENCOUNTER — Other Ambulatory Visit: Payer: Self-pay | Admitting: General Surgery

## 2019-10-01 DIAGNOSIS — K432 Incisional hernia without obstruction or gangrene: Secondary | ICD-10-CM

## 2019-10-14 ENCOUNTER — Other Ambulatory Visit: Payer: Self-pay | Admitting: Internal Medicine

## 2019-10-16 ENCOUNTER — Ambulatory Visit
Admission: RE | Admit: 2019-10-16 | Discharge: 2019-10-16 | Disposition: A | Discharge status: Home / Self Care | Payer: Medicare Other | Source: Ambulatory Visit | Attending: General Surgery | Admitting: General Surgery

## 2019-10-16 DIAGNOSIS — K432 Incisional hernia without obstruction or gangrene: Secondary | ICD-10-CM

## 2019-10-16 MED ORDER — IOPAMIDOL (ISOVUE-300) INJECTION 61%
100.0000 mL | Freq: Once | INTRAVENOUS | Status: AC | PRN
  Administered 2019-10-16: 100 mL via INTRAVENOUS

## 2019-10-20 ENCOUNTER — Other Ambulatory Visit: Payer: Self-pay | Admitting: Internal Medicine

## 2019-11-13 ENCOUNTER — Encounter: Payer: Self-pay | Admitting: Skilled Nursing Facility1

## 2019-11-13 ENCOUNTER — Encounter: Payer: Medicare Other | Attending: General Surgery | Admitting: Skilled Nursing Facility1

## 2019-11-13 ENCOUNTER — Other Ambulatory Visit: Payer: Self-pay

## 2019-11-13 NOTE — Progress Notes (Signed)
  Assessment:  Primary concerns today: weight loss.   Pt was tearful throughout the appt. Stating her husband left her after 41 years on their anniversary date 4 years ago and helps care for her mother so she is stressed stating she notices she gets set off easily.  Pt states she is aware of the signs/symptoms to be aware of to go to the ED for: abdominal pain, vomiting, constipation/diarrhea.  Pt states she has had several hernia reapairs and has new hernias recently stating her doctor is just going to monitor for now.  Pt states if she does not eat her stomach will hurt.  Pt states if she eats raw salad she will throw up (saw her doctor after that episode getting a CAT scan).  Pt states broccoli gives her gas.  Pt states she has a bowel movement daily soft to more solid.   Pts insurance does not cover these appt.   MEDICATIONS: see list   DIETARY INTAKE:  Usual eating pattern includes 3 meals and 2 snacks per day.  Everyday foods include none stated.  Avoided foods include salad.    24-hr recall:  B ( AM): egg and bacon and fat back sandwich or raisin bran or grits + cheese Snk ( AM): chips L ( PM): sandwich Snk ( PM): fruit D ( PM): popcorn Snk ( PM):  Beverages: 2 coffee + cream + sugar, lemonade, water  Usual physical activity: ADL's  Progress Towards Goal(s):  In progress.    Intervention:  Nutrition cousneling. Dietitian educated pt on low fiber diet within he context of weight loss.  Goals: Avoid all fresh vegetables and salads  Fruits okay: half a banana, applesauce (1 cup) not a full apple, peaches without the skin, canned fruit is a good option not packed in syrup, plum without the skin Keep a food and symptom journal: write everything you put into your mouth and how you are feeling and what your bowel movements were Increase your water to 4 bottles per day Use Splenda instead of sugar in your lemonade and coffee Stop using fat back; do not add it for flavor  either Get back with your counselor  Do some home exercise: peddler or YouTube video  Do not add sugar to any of your foods  Limit popcorn   cup serving of ice cream Limit sweets to 3 times a week  Teaching Method Utilized: Visual Auditory Hands on  Handouts given during visit include:   Barriers to learning/adherence to lifestyle change: non hunger eating  Demonstrated degree of understanding via:  Teach Back   Monitoring/Evaluation:  Dietary intake, exercise,  and body weight prn.

## 2019-11-21 ENCOUNTER — Other Ambulatory Visit: Payer: Self-pay | Admitting: Internal Medicine

## 2019-11-24 ENCOUNTER — Other Ambulatory Visit: Payer: Self-pay | Admitting: Internal Medicine

## 2019-12-01 ENCOUNTER — Emergency Department (HOSPITAL_COMMUNITY): Payer: Medicare Other

## 2019-12-01 ENCOUNTER — Inpatient Hospital Stay (HOSPITAL_COMMUNITY)
Admission: EM | Admit: 2019-12-01 | Discharge: 2019-12-05 | DRG: 389 | Disposition: A | Discharge status: Home / Self Care | Payer: Medicare Other | Attending: Internal Medicine | Admitting: Internal Medicine

## 2019-12-01 ENCOUNTER — Encounter (HOSPITAL_COMMUNITY): Payer: Self-pay

## 2019-12-01 DIAGNOSIS — Z885 Allergy status to narcotic agent status: Secondary | ICD-10-CM

## 2019-12-01 DIAGNOSIS — Z79891 Long term (current) use of opiate analgesic: Secondary | ICD-10-CM

## 2019-12-01 DIAGNOSIS — K56600 Partial intestinal obstruction, unspecified as to cause: Principal | ICD-10-CM | POA: Diagnosis present

## 2019-12-01 DIAGNOSIS — K56609 Unspecified intestinal obstruction, unspecified as to partial versus complete obstruction: Secondary | ICD-10-CM | POA: Diagnosis present

## 2019-12-01 DIAGNOSIS — E876 Hypokalemia: Secondary | ICD-10-CM | POA: Diagnosis present

## 2019-12-01 DIAGNOSIS — K430 Incisional hernia with obstruction, without gangrene: Secondary | ICD-10-CM | POA: Diagnosis present

## 2019-12-01 DIAGNOSIS — I10 Essential (primary) hypertension: Secondary | ICD-10-CM | POA: Diagnosis present

## 2019-12-01 DIAGNOSIS — M1990 Unspecified osteoarthritis, unspecified site: Secondary | ICD-10-CM | POA: Diagnosis present

## 2019-12-01 DIAGNOSIS — Z825 Family history of asthma and other chronic lower respiratory diseases: Secondary | ICD-10-CM

## 2019-12-01 DIAGNOSIS — Z6841 Body Mass Index (BMI) 40.0 and over, adult: Secondary | ICD-10-CM

## 2019-12-01 DIAGNOSIS — Z20822 Contact with and (suspected) exposure to covid-19: Secondary | ICD-10-CM | POA: Diagnosis present

## 2019-12-01 DIAGNOSIS — Z888 Allergy status to other drugs, medicaments and biological substances status: Secondary | ICD-10-CM

## 2019-12-01 DIAGNOSIS — K219 Gastro-esophageal reflux disease without esophagitis: Secondary | ICD-10-CM | POA: Diagnosis present

## 2019-12-01 DIAGNOSIS — Z886 Allergy status to analgesic agent status: Secondary | ICD-10-CM

## 2019-12-01 DIAGNOSIS — Z981 Arthrodesis status: Secondary | ICD-10-CM

## 2019-12-01 DIAGNOSIS — E785 Hyperlipidemia, unspecified: Secondary | ICD-10-CM | POA: Diagnosis present

## 2019-12-01 DIAGNOSIS — E041 Nontoxic single thyroid nodule: Secondary | ICD-10-CM | POA: Diagnosis present

## 2019-12-01 DIAGNOSIS — R109 Unspecified abdominal pain: Secondary | ICD-10-CM | POA: Diagnosis not present

## 2019-12-01 DIAGNOSIS — K432 Incisional hernia without obstruction or gangrene: Secondary | ICD-10-CM | POA: Diagnosis present

## 2019-12-01 DIAGNOSIS — K529 Noninfective gastroenteritis and colitis, unspecified: Secondary | ICD-10-CM

## 2019-12-01 DIAGNOSIS — Z79899 Other long term (current) drug therapy: Secondary | ICD-10-CM

## 2019-12-01 LAB — URINALYSIS, ROUTINE W REFLEX MICROSCOPIC
Bilirubin Urine: NEGATIVE
Glucose, UA: NEGATIVE mg/dL
Hgb urine dipstick: NEGATIVE
Ketones, ur: 20 mg/dL — AB
Leukocytes,Ua: NEGATIVE
Nitrite: NEGATIVE
Protein, ur: 100 mg/dL — AB
Specific Gravity, Urine: 1.012 (ref 1.005–1.030)
pH: 5 (ref 5.0–8.0)

## 2019-12-01 LAB — CBC
HCT: 38.6 % (ref 36.0–46.0)
Hemoglobin: 12.5 g/dL (ref 12.0–15.0)
MCH: 28.4 pg (ref 26.0–34.0)
MCHC: 32.4 g/dL (ref 30.0–36.0)
MCV: 87.7 fL (ref 80.0–100.0)
Platelets: 337 10*3/uL (ref 150–400)
RBC: 4.4 MIL/uL (ref 3.87–5.11)
RDW: 13.7 % (ref 11.5–15.5)
WBC: 6.3 10*3/uL (ref 4.0–10.5)
nRBC: 0 % (ref 0.0–0.2)

## 2019-12-01 NOTE — ED Triage Notes (Signed)
Pt arrives POV for eval of N/V since Friday, reports watery diarrhea. States hx of bowel obstruction and this feels similar. No fever, chills. Reports severe epigastric pain

## 2019-12-02 ENCOUNTER — Emergency Department (HOSPITAL_COMMUNITY): Payer: Medicare Other

## 2019-12-02 DIAGNOSIS — R109 Unspecified abdominal pain: Secondary | ICD-10-CM | POA: Diagnosis present

## 2019-12-02 DIAGNOSIS — E785 Hyperlipidemia, unspecified: Secondary | ICD-10-CM | POA: Diagnosis present

## 2019-12-02 DIAGNOSIS — K432 Incisional hernia without obstruction or gangrene: Secondary | ICD-10-CM | POA: Diagnosis present

## 2019-12-02 DIAGNOSIS — Z885 Allergy status to narcotic agent status: Secondary | ICD-10-CM | POA: Diagnosis not present

## 2019-12-02 DIAGNOSIS — E876 Hypokalemia: Secondary | ICD-10-CM

## 2019-12-02 DIAGNOSIS — Z79899 Other long term (current) drug therapy: Secondary | ICD-10-CM | POA: Diagnosis not present

## 2019-12-02 DIAGNOSIS — K430 Incisional hernia with obstruction, without gangrene: Secondary | ICD-10-CM | POA: Diagnosis present

## 2019-12-02 DIAGNOSIS — Z886 Allergy status to analgesic agent status: Secondary | ICD-10-CM | POA: Diagnosis not present

## 2019-12-02 DIAGNOSIS — Z981 Arthrodesis status: Secondary | ICD-10-CM | POA: Diagnosis not present

## 2019-12-02 DIAGNOSIS — Z79891 Long term (current) use of opiate analgesic: Secondary | ICD-10-CM | POA: Diagnosis not present

## 2019-12-02 DIAGNOSIS — K56600 Partial intestinal obstruction, unspecified as to cause: Secondary | ICD-10-CM | POA: Diagnosis present

## 2019-12-02 DIAGNOSIS — K219 Gastro-esophageal reflux disease without esophagitis: Secondary | ICD-10-CM | POA: Diagnosis present

## 2019-12-02 DIAGNOSIS — I10 Essential (primary) hypertension: Secondary | ICD-10-CM | POA: Diagnosis present

## 2019-12-02 DIAGNOSIS — Z6841 Body Mass Index (BMI) 40.0 and over, adult: Secondary | ICD-10-CM | POA: Diagnosis not present

## 2019-12-02 DIAGNOSIS — Z20822 Contact with and (suspected) exposure to covid-19: Secondary | ICD-10-CM | POA: Diagnosis present

## 2019-12-02 DIAGNOSIS — Z825 Family history of asthma and other chronic lower respiratory diseases: Secondary | ICD-10-CM | POA: Diagnosis not present

## 2019-12-02 DIAGNOSIS — M1990 Unspecified osteoarthritis, unspecified site: Secondary | ICD-10-CM | POA: Diagnosis present

## 2019-12-02 DIAGNOSIS — Z888 Allergy status to other drugs, medicaments and biological substances status: Secondary | ICD-10-CM | POA: Diagnosis not present

## 2019-12-02 DIAGNOSIS — K56609 Unspecified intestinal obstruction, unspecified as to partial versus complete obstruction: Secondary | ICD-10-CM

## 2019-12-02 DIAGNOSIS — E041 Nontoxic single thyroid nodule: Secondary | ICD-10-CM | POA: Diagnosis present

## 2019-12-02 HISTORY — DX: Hypokalemia: E87.6

## 2019-12-02 LAB — COMPREHENSIVE METABOLIC PANEL
ALT: 23 U/L (ref 0–44)
AST: 25 U/L (ref 15–41)
Albumin: 4.2 g/dL (ref 3.5–5.0)
Alkaline Phosphatase: 67 U/L (ref 38–126)
Anion gap: 12 (ref 5–15)
BUN: 10 mg/dL (ref 8–23)
CO2: 32 mmol/L (ref 22–32)
Calcium: 9.4 mg/dL (ref 8.9–10.3)
Chloride: 95 mmol/L — ABNORMAL LOW (ref 98–111)
Creatinine, Ser: 0.71 mg/dL (ref 0.44–1.00)
GFR calc Af Amer: >60 mL/min (ref >60)
GFR calc non Af Amer: >60 mL/min (ref >60)
Glucose, Bld: 134 mg/dL — ABNORMAL HIGH (ref 70–99)
Potassium: 2.2 mmol/L — CL (ref 3.5–5.1)
Sodium: 139 mmol/L (ref 135–145)
Total Bilirubin: 1.1 mg/dL (ref 0.3–1.2)
Total Protein: 7.4 g/dL (ref 6.5–8.1)

## 2019-12-02 LAB — SARS CORONAVIRUS 2 BY RT PCR (HOSPITAL ORDER, PERFORMED IN ~~LOC~~ HOSPITAL LAB): SARS Coronavirus 2: NEGATIVE

## 2019-12-02 LAB — LIPASE, BLOOD: Lipase: 34 U/L (ref 11–51)

## 2019-12-02 LAB — MAGNESIUM: Magnesium: 1.6 mg/dL — ABNORMAL LOW (ref 1.7–2.4)

## 2019-12-02 LAB — HIV ANTIBODY (ROUTINE TESTING W REFLEX): HIV Screen 4th Generation wRfx: NONREACTIVE

## 2019-12-02 MED ORDER — ATORVASTATIN CALCIUM 10 MG PO TABS
10.0000 mg | ORAL_TABLET | Freq: Every day | ORAL | Status: DC
  Administered 2019-12-02 – 2019-12-05 (×4): 10 mg via ORAL
  Filled 2019-12-02 (×4): qty 1

## 2019-12-02 MED ORDER — LOSARTAN POTASSIUM 50 MG PO TABS
100.0000 mg | ORAL_TABLET | Freq: Every day | ORAL | Status: DC
  Administered 2019-12-02 – 2019-12-05 (×4): 100 mg via ORAL
  Filled 2019-12-02 (×4): qty 2

## 2019-12-02 MED ORDER — KCL IN DEXTROSE-NACL 10-5-0.45 MEQ/L-%-% IV SOLN
INTRAVENOUS | Status: DC
  Filled 2019-12-02 (×9): qty 1000

## 2019-12-02 MED ORDER — SODIUM CHLORIDE 0.9 % IV SOLN: INTRAVENOUS | Status: DC

## 2019-12-02 MED ORDER — ADULT MULTIVITAMIN W/MINERALS CH
1.0000 | ORAL_TABLET | Freq: Every day | ORAL | Status: DC
  Administered 2019-12-02 – 2019-12-05 (×4): 1 via ORAL
  Filled 2019-12-02 (×4): qty 1

## 2019-12-02 MED ORDER — ACETAMINOPHEN 650 MG RE SUPP: 650.0000 mg | Freq: Four times a day (QID) | RECTAL | Status: DC | PRN

## 2019-12-02 MED ORDER — ALBUTEROL SULFATE HFA 108 (90 BASE) MCG/ACT IN AERS
2.0000 | INHALATION_SPRAY | Freq: Four times a day (QID) | RESPIRATORY_TRACT | Status: DC | PRN
  Filled 2019-12-02: qty 6.7

## 2019-12-02 MED ORDER — ACETAMINOPHEN 325 MG PO TABS: 650.0000 mg | ORAL_TABLET | Freq: Four times a day (QID) | ORAL | Status: DC | PRN

## 2019-12-02 MED ORDER — IOHEXOL 300 MG/ML  SOLN
125.0000 mL | Freq: Once | INTRAMUSCULAR | Status: AC | PRN
  Administered 2019-12-02: 125 mL via INTRAVENOUS

## 2019-12-02 MED ORDER — POLYETHYLENE GLYCOL 3350 17 G PO PACK: 17.0000 g | PACK | Freq: Every day | ORAL | Status: DC | PRN

## 2019-12-02 MED ORDER — SODIUM CHLORIDE 0.9 % IV BOLUS
1000.0000 mL | Freq: Once | INTRAVENOUS | Status: AC
  Administered 2019-12-02: 1000 mL via INTRAVENOUS

## 2019-12-02 MED ORDER — ONDANSETRON HCL 4 MG PO TABS: 4.0000 mg | ORAL_TABLET | Freq: Four times a day (QID) | ORAL | Status: DC | PRN

## 2019-12-02 MED ORDER — ALBUTEROL SULFATE (2.5 MG/3ML) 0.083% IN NEBU: 2.5000 mg | INHALATION_SOLUTION | Freq: Four times a day (QID) | RESPIRATORY_TRACT | Status: DC | PRN

## 2019-12-02 MED ORDER — ONDANSETRON HCL 4 MG/2ML IJ SOLN
4.0000 mg | Freq: Once | INTRAMUSCULAR | Status: DC
  Filled 2019-12-02: qty 2

## 2019-12-02 MED ORDER — PANTOPRAZOLE SODIUM 40 MG PO TBEC
40.0000 mg | DELAYED_RELEASE_TABLET | Freq: Every day | ORAL | Status: DC
  Administered 2019-12-02 – 2019-12-05 (×4): 40 mg via ORAL
  Filled 2019-12-02 (×4): qty 1

## 2019-12-02 MED ORDER — HEPARIN SODIUM (PORCINE) 5000 UNIT/ML IJ SOLN
5000.0000 [IU] | Freq: Three times a day (TID) | INTRAMUSCULAR | Status: DC
  Administered 2019-12-02 – 2019-12-05 (×10): 5000 [IU] via SUBCUTANEOUS
  Filled 2019-12-02 (×11): qty 1

## 2019-12-02 MED ORDER — ONDANSETRON HCL 4 MG/2ML IJ SOLN: 4.0000 mg | Freq: Four times a day (QID) | INTRAMUSCULAR | Status: DC | PRN

## 2019-12-02 MED ORDER — POTASSIUM CHLORIDE 10 MEQ/100ML IV SOLN
10.0000 meq | Freq: Once | INTRAVENOUS | Status: AC
  Administered 2019-12-02: 10 meq via INTRAVENOUS
  Filled 2019-12-02: qty 100

## 2019-12-02 MED ORDER — POTASSIUM CHLORIDE 10 MEQ/100ML IV SOLN
10.0000 meq | INTRAVENOUS | Status: AC
  Administered 2019-12-02 (×4): 10 meq via INTRAVENOUS
  Filled 2019-12-02 (×4): qty 100
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
## 2019-12-02 NOTE — ED Notes (Signed)
Pt to CT via stretcher
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
## 2019-12-02 NOTE — ED Notes (Signed)
Admitting at bedside 
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
## 2019-12-02 NOTE — ED Notes (Signed)
Pt up to bedside commode

## 2019-12-02 NOTE — Progress Notes (Signed)
PROGRESS NOTE    Patient: Rhonda MiyamotoLuretta M [name]                            PCP: Rhonda [name], Sylvia, [name]                    DOB: [birth year]            DOA: 12/01/2019 [id number]             DOS: 12/02/2019, 11:42 AM   LOS: 0 days   Date of Service: The patient was seen and examined on 12/02/2019  Subjective:   The patient was seen and examined this Am. Hemodynamically stable, nausea vomiting and diarrhea has improved Still complaining of : Abdominal pain, discomfort   Brief Narrative:  Per HPI:   Rhonda [name]  is a 69 y.o. female, with history of Thyroid nodule, hypertension, hyperlipidemia, GERD, hernia, 3 small bowel obstructions, and more presents to the ER with a chief complaint of abdominal pain, nausea, vomiting.  Patient reports that she started having diarrhea nausea and vomiting on Friday (3 days ago).  She reports that for the past 2 days there has only been an hour between finishing dinner and emesis.    Her and her family became more concerned today because of a noticed that her emesis was getting "mercury" and starting to smell like feces.   ED course Temperature 98.2, blood pressure 154/66, heart rate 63, respiratory 19 Rx w IV Zofran, KCl, normal saline Covid test  -negative WBC  6.3, hemoglobin 12.5, CHEM panel is grossly unremarkable except for hypokalemia at 2.2 UA shows rare bacteria, 0-5 squamous cell CT abdomen pelvis shows chronic small polyp containing midline hernia with chronic partial obstruction X-ray abdomen shows nonspecific dilated loop of bowel EKG shows heart rate 70 QTC 455 no acute ischemic changes  Hospital course: 12/02/2019 -n.p.o., still no NG tube placement, general surgery following, IV fluids Nausea vomiting and diarrhea has improved   Assessment & Plan:   Active Problems:   SBO (small bowel obstruction) (HCC)   Hypokalemia due to excessive gastrointestinal loss of potassium   Chronic small bowel obstruction - Chronic  obstruction seen on CT -Nausea vomiting has improved, still considering NG tube -General surgery following-appreciate recommendations -Patient will remain n.p.o., continue IV fluid resuscitation -Continue as needed antiemetics, Zofran, Phenergan  Note:  Dr. Derrell Lollingamirez on 2/14/18She underwent an open rectrorectus ventral hernia repair  Dr. Sheliah HatchKinsinger 09/11/2017 - She underwent exploratory laparotomy, lysis of adhesions, enterorrhaphy, incisional hernia repair. Dr. Donell BeersByerly 02/14/2019 - She underwernt repair of recurrent incarcerated ventral incisional hernia and lysis of adhesions.   Dr. Cliffton AstersWhite on 07/25/2019 - She underwent exploratory laparotomy with primary repair of recurrent incisional hernia  She is following with Dr. Sheliah HatchKinsinger   Hypokalemia 1. 2/2 GI losses 2. Replacing IV today  3. Check mag  Nausea/vomiting/ diarrhea 1. Improving 2. possibly 2/2 SBO 3. History is not consistent with food poisoning 4. Vaccinated for covid 5. No WBC acct 6. Most likely mechanical as in SBO  GERD 1. Continue PPI       Consultants: General surgery   ------------------------------------------------------------------------------------------------------------------------------------------  DVT prophylaxis:  SCD/Compression stockings Code Status:   Code Status: Full Code Family Communication: No family member present at bedside- attempt will be made to update daily The above findings and plan of care has been discussed with patient )  in detail,  they expressed understanding and agreement of above. -Advance  care planning has been discussed.   Admission status:    Status is: Inpatient  Remains inpatient appropriate because:Inpatient level of care appropriate due to severity of illness   Dispo: The patient is from: Home              Anticipated d/c is to: Home              Anticipated d/c date is: 3 days              Patient currently is not medically stable to  d/c.        Procedures:   No admission procedures for hospital encounter.     Antimicrobials:  Anti-infectives (From admission, onward)   None       Medication:  . atorvastatin  10 mg Oral Daily  . heparin  5,000 Units Subcutaneous Q8H  . losartan  100 mg Oral Daily  . multivitamin with minerals  1 tablet Oral Daily  . ondansetron (ZOFRAN) IV  4 mg Intravenous Once  . pantoprazole  40 mg Oral Daily    acetaminophen **OR** acetaminophen, albuterol, ondansetron **OR** ondansetron (ZOFRAN) IV, polyethylene glycol   Objective:   Vitals:   12/02/19 0754 12/02/19 0815 12/02/19 1030 12/02/19 1045  BP:  (!) 143/54 (!) 115/96 (!) 144/68  Pulse: 75 73 66 66  Resp: 14 15 17 16   Temp:      TempSrc:      SpO2: 99% 100% 98% 99%  Weight:      Height:        Intake/Output Summary (Last 24 hours) at 12/02/2019 1142 Last data filed at 12/02/2019 1022 Gross per 24 hour  Intake 1200 ml  Output --  Net 1200 ml   Filed Weights   12/01/19 2310  Weight: (!) 117 kg     Examination:   Physical Exam  Constitution:  Alert, cooperative, no distress,  Appears calm and comfortable  Psychiatric: Normal and stable mood and affect, cognition intact,   HEENT: Normocephalic, PERRL, otherwise with in Normal limits  Chest:Chest symmetric Cardio vascular:  S1/S2, RRR, No murmure, No Rubs or Gallops  pulmonary: Clear to auscultation bilaterally, respirations unlabored, negative wheezes / crackles Abdomen: Soft, mild diffuse tenderness, hypoactive bowel sounds, non-distended,no masses, no organomegaly Muscular skeletal: Limited exam - in bed, able to move all 4 extremities, Normal strength,  Neuro: CNII-XII intact. , normal motor and sensation, reflexes intact  Extremities: No pitting edema lower extremities, +2 pulses  Skin: Dry, warm to touch, negative for any Rashes, No open wounds Wounds: per nursing documentation     ------------------------------------------------------------------------------------------------------------------------------------------    LABs:  CBC Latest Ref Rng & Units 12/01/2019 07/26/2019 07/25/2019  WBC 4.0 - 10.5 K/uL 6.3 9.9 5.4  Hemoglobin 12.0 - 15.0 g/dL 07/27/2019 41.9 37.9  Hematocrit 36 - 46 % 38.6 37.0 37.3  Platelets 150 - 400 K/uL 337 272 270   CMP Latest Ref Rng & Units 12/01/2019 07/27/2019 07/26/2019  Glucose 70 - 99 mg/dL 07/28/2019) 097(D) 532(D)  BUN 8 - 23 mg/dL 10 7(L) 8  Creatinine 924(Q - 1.00 mg/dL 6.83 4.19 6.22  Sodium 135 - 145 mmol/L 139 142 139  Potassium 3.5 - 5.1 mmol/L 2.2(LL) 3.6 3.5  Chloride 98 - 111 mmol/L 95(L) 106 102  CO2 22 - 32 mmol/L 32 29 26  Calcium 8.9 - 10.3 mg/dL 9.4 2.97) 8.9  Total Protein 6.5 - 8.1 g/dL 7.4 - -  Total Bilirubin 0.3 - 1.2 mg/dL 1.1 - -  Alkaline Phos 38 - 126 U/L 67 - -  AST 15 - 41 U/L 25 - -  ALT 0 - 44 U/L 23 - -       Micro Results Recent Results (from the past 240 hour(s))  SARS Coronavirus 2 by RT PCR (hospital order, performed in [facility] hospital lab) Nasopharyngeal Nasopharyngeal Swab     Status: None   Collection Time: 12/02/19  4:48 AM   Specimen: Nasopharyngeal Swab  Result Value Ref Range Status   SARS Coronavirus 2 NEGATIVE NEGATIVE Final    Comment: (NOTE) SARS-CoV-2 target nucleic acids are NOT DETECTED.  The SARS-CoV-2 RNA is generally detectable in upper and lower respiratory specimens during the acute phase of infection. The lowest concentration of SARS-CoV-2 viral copies this assay can detect is 250 copies / mL. A negative result does not preclude SARS-CoV-2 infection and should not be used as the sole basis for treatment or other patient management decisions.  A negative result may occur with improper specimen collection / handling, submission of specimen other than nasopharyngeal swab, presence of viral mutation(s) within the areas targeted by this assay, and inadequate number of viral  copies (<250 copies / mL). A negative result must be combined with clinical observations, patient history, and epidemiological information.  Fact Sheet for Patients:   BoilerBrush.com.cy  Fact Sheet for Healthcare Providers: https://pope.com/  This test is not yet approved or  cleared by the [place] FDA and has been authorized for detection and/or diagnosis of SARS-CoV-2 by FDA under an Emergency Use Authorization (EUA).  This EUA will remain in effect (meaning this test can be used) for the duration of the COVID-19 declaration under Section 564(b)(1) of the Act, 21 U.S.C. section 360bbb-3(b)(1), unless the authorization is terminated or revoked sooner.  Performed at [facility], [address], [place], [place] [address]     Radiology Reports DG Abdomen 1 View  Result Date: 12/01/2019 CLINICAL DATA:  Nausea and vomiting EXAM: ABDOMEN - 1 VIEW COMPARISON:  None. FINDINGS: There is a focal mildly dilated air-filled loops of bowel seen within the mid abdomen measuring to 5 cm. Air seen in nondilated loops of bowel within the mid abdomen. No radio-opaque calculi or other significant radiographic abnormality are seen. IMPRESSION: Nonspecific focally dilated loop of bowel within the mid abdomen which could represent a focal ileus. Electronically Signed   By: Jonna Clark M.D.   On: 12/01/2019 23:48   CT ABDOMEN PELVIS W CONTRAST  Result Date: 12/02/2019 CLINICAL DATA:  Abdominal abscess/infection suspected. Nausea and vomiting with severe epigastric pain for 3 days with watery diarrhea. EXAM: CT ABDOMEN AND PELVIS WITH CONTRAST TECHNIQUE: Multidetector CT imaging of the abdomen and pelvis was performed using the standard protocol following bolus administration of intravenous contrast. CONTRAST:  OMNIPAQUE IOHEXOL 300 MG/ML  SOLN COMPARISON:  10/16/2019 FINDINGS: Lower chest:  No contributory findings. Hepatobiliary: No worrisome  liver abnormality. A tiny low-density in the ventral right liver is most likely cyst given discrete appearance for size. Tiny calcified gallstone. No evidence of biliary inflammation or obstruction. Pancreas: Unremarkable. Spleen: Unremarkable. Adrenals/Urinary Tract: Negative adrenals. No hydronephrosis or stone. Unremarkable bladder. Stomach/Bowel: 2 midline incisional hernias containing loops of small bowel. Continued dilatation of proximal small bowel loops measuring up to 4.5 cm in diameter. Oral contrast was not administered. Prominent distal gastric wall thickness but circumferential and associated with luminal collapse. Small sliding hiatal hernia. Left colonic diverticulosis. Vascular/Lymphatic: No acute vascular abnormality. Area of fat necrosis  in low and ventral left lower quadrant. No mass or adenopathy. Reproductive:3 cm calcified fibroid. Other: No ascites or pneumoperitoneum. Musculoskeletal: Lumbar spine degeneration with L5-S1 fusion and solid arthrodesis. IMPRESSION: Chronic small bowel containing midline hernias with chronic partial obstruction. Electronically Signed   By: Marnee Spring M.D.   On: 12/02/2019 04:36    SIGNED: Kendell Bane, MD, FACP, FHM. [facility],  Pager (please use [email or web address] to page/text)  If 7PM-7AM, please contact night-coverage [email or web address] 12/02/2019, 11:42 AM

## 2019-12-02 NOTE — ED Notes (Signed)
Dr Blinda Leatherwood is aware K 2.2, priority for room

## 2019-12-03 ENCOUNTER — Inpatient Hospital Stay (HOSPITAL_COMMUNITY): Payer: Medicare Other

## 2019-12-03 LAB — BASIC METABOLIC PANEL
Anion gap: 10 (ref 5–15)
BUN: <5 mg/dL — ABNORMAL LOW (ref 8–23)
CO2: 31 mmol/L (ref 22–32)
Calcium: 8.1 mg/dL — ABNORMAL LOW (ref 8.9–10.3)
Chloride: 100 mmol/L (ref 98–111)
Creatinine, Ser: 0.59 mg/dL (ref 0.44–1.00)
GFR calc Af Amer: >60 mL/min (ref >60)
GFR calc non Af Amer: >60 mL/min (ref >60)
Glucose, Bld: 106 mg/dL — ABNORMAL HIGH (ref 70–99)
Potassium: 2.2 mmol/L — CL (ref 3.5–5.1)
Sodium: 141 mmol/L (ref 135–145)

## 2019-12-03 LAB — CBC
HCT: 32.7 % — ABNORMAL LOW (ref 36.0–46.0)
Hemoglobin: 10.6 g/dL — ABNORMAL LOW (ref 12.0–15.0)
MCH: 28 pg (ref 26.0–34.0)
MCHC: 32.4 g/dL (ref 30.0–36.0)
MCV: 86.5 fL (ref 80.0–100.0)
Platelets: 263 10*3/uL (ref 150–400)
RBC: 3.78 MIL/uL — ABNORMAL LOW (ref 3.87–5.11)
RDW: 13.7 % (ref 11.5–15.5)
WBC: 5.2 10*3/uL (ref 4.0–10.5)
nRBC: 0 % (ref 0.0–0.2)

## 2019-12-03 LAB — POTASSIUM: Potassium: 2.6 mmol/L — CL (ref 3.5–5.1)

## 2019-12-03 LAB — MAGNESIUM: Magnesium: 1.6 mg/dL — ABNORMAL LOW (ref 1.7–2.4)

## 2019-12-03 MED ORDER — POTASSIUM CHLORIDE 10 MEQ/100ML IV SOLN
10.0000 meq | INTRAVENOUS | Status: AC
  Administered 2019-12-03 (×4): 10 meq via INTRAVENOUS
  Filled 2019-12-03 (×4): qty 100

## 2019-12-03 MED ORDER — MAGNESIUM SULFATE 2 GM/50ML IV SOLN
2.0000 g | Freq: Once | INTRAVENOUS | Status: AC
  Administered 2019-12-03: 2 g via INTRAVENOUS
  Filled 2019-12-03: qty 50

## 2019-12-03 MED ORDER — FLUTICASONE PROPIONATE 50 MCG/ACT NA SUSP
1.0000 | Freq: Every day | NASAL | Status: DC
  Administered 2019-12-03 – 2019-12-05 (×3): 1 via NASAL
  Filled 2019-12-03: qty 16

## 2019-12-03 MED ORDER — DORZOLAMIDE HCL-TIMOLOL MAL 2-0.5 % OP SOLN
1.0000 [drp] | Freq: Two times a day (BID) | OPHTHALMIC | Status: DC
  Administered 2019-12-03 – 2019-12-05 (×5): 1 [drp] via OPHTHALMIC
  Filled 2019-12-03: qty 10

## 2019-12-03 MED ORDER — POTASSIUM CHLORIDE 10 MEQ/100ML IV SOLN
10.0000 meq | INTRAVENOUS | Status: AC
  Administered 2019-12-03 (×6): 10 meq via INTRAVENOUS
  Filled 2019-12-03 (×6): qty 100

## 2019-12-03 NOTE — Plan of Care (Signed)
  Problem: Education: Goal: Knowledge of General Education information will improve Description Including pain rating scale, medication(s)/side effects and non-pharmacologic comfort measures Outcome: Progressing   

## 2019-12-03 NOTE — Progress Notes (Signed)
PROGRESS NOTE    Patient: Rhonda [name]                            PCP: Garey Hamodriguez-Southworth, Sylvia, PA-C                    DOB: [birth year]            DOA: 12/01/2019 [id number]             DOS: 12/03/2019, 12:52 PM   LOS: 1 day   Date of Service: The patient was seen and examined on 12/03/2019  Subjective:   The patient was seen and examined this morning, remained stable in bed. No nausea or vomiting overnight or this a.m. Reporting of gas but no BMs yet   Reported patient Potassium persistently remains low 2.2 this a.m., repeated after 4 rounds of K2.6    Brief Narrative:  Per HPI:   Rhonda [name]  is a 69 y.o. female, with history of Thyroid nodule, hypertension, hyperlipidemia, GERD, hernia, 3 small bowel obstructions, and more presents to the ER with a chief complaint of abdominal pain, nausea, vomiting.  Patient reports that she started having diarrhea nausea and vomiting on Friday (3 days ago).  She reports that for the past 2 days there has only been an hour between finishing dinner and emesis.    Her and her family became more concerned today because of a noticed that her emesis was getting "mercury" and starting to smell like feces.   ED course Temperature 98.2, blood pressure 154/66, heart rate 63, respiratory 19 Rx w IV Zofran, KCl, normal saline Covid test  -negative WBC  6.3, hemoglobin 12.5, CHEM panel is grossly unremarkable except for hypokalemia at 2.2 UA shows rare bacteria, 0-5 squamous cell CT abdomen pelvis shows chronic small polyp containing midline hernia with chronic partial obstruction X-ray abdomen shows nonspecific dilated loop of bowel EKG shows heart rate 70 QTC 455 no acute ischemic changes  Hospital course: 12/02/2019 -n.p.o., still no NG tube placement, general surgery following, IV fluids Nausea vomiting and diarrhea has improved  12/03/2019 -patient remains n.p.o., on IV fluids, no NG tube in place, GI following, reporting gas but no  BMs yet  Assessment & Plan:   Active Problems:   SBO (small bowel obstruction) (HCC)   Hypokalemia due to excessive gastrointestinal loss of potassium   Chronic small bowel obstruction -Patient is abdomen remain soft, reporting gas but no bowel movements yet -General surgery following, continue conservative management - Chronic obstruction seen on CT -Nausea vomiting has improved, no NG tube was placed -General surgery following-appreciate recommendations -continue IV fluid resuscitation -Continue as needed antiemetics, Zofran, Phenergan -Anticipating initiation of trial of clear liquid diet by surgery today  Note:  Dr. Derrell Lollingamirez on 2/14/18She underwent an open rectrorectus ventral hernia repair  Dr. Sheliah HatchKinsinger 09/11/2017 - She underwent exploratory laparotomy, lysis of adhesions, enterorrhaphy, incisional hernia repair. Dr. Donell BeersByerly 02/14/2019 - She underwernt repair of recurrent incarcerated ventral incisional hernia and lysis of adhesions.   Dr. Cliffton AstersWhite on 07/25/2019 - She underwent exploratory laparotomy with primary repair of recurrent incisional hernia  She is following with Dr. Sheliah HatchKinsinger   Hypokalemia Chryl Heck/hypomagnesemia 1. 2/2 GI losses 2. Consistently low, 2.2, 2.2, 2.6 this morning after 4 rounds of KCl this morning 3. Additional IV KCl 60 mEq, repleting magnesium 2 g IV 4. We will monitor closely  Nausea/vomiting/ diarrhea 1. Improving 2. possibly 2/2 SBO 3. History  is not consistent with food poisoning 4. Vaccinated for covid 5. No WBC acct 6. Most likely mechanical as in SBO  GERD 1. Continue PPI       Consultants: General surgery   ------------------------------------------------------------------------------------------------------------------------------------  DVT prophylaxis:  SCD/Compression stockings Code Status:   Code Status: Full Code Family Communication:  No family member present at bedside-discussed with patient. The above findings and plan of  care has been discussed with patient   in detail,  they expressed understanding and agreement of above.   Admission status:    Status is: Inpatient  Remains inpatient appropriate because:Inpatient level of care appropriate due to severity of illness   Dispo: The patient is from: Home              Anticipated d/c is to: Home              Anticipated d/c date is: 3 days              Patient currently is not medically stable to d/c.        Procedures:   No admission procedures for hospital encounter.     Antimicrobials:  Anti-infectives (From admission, onward)   None       Medication:  . atorvastatin  10 mg Oral Daily  . dorzolamide-timolol  1 drop Both Eyes BID  . fluticasone  1 spray Each Nare Daily  . heparin  5,000 Units Subcutaneous Q8H  . losartan  100 mg Oral Daily  . multivitamin with minerals  1 tablet Oral Daily  . pantoprazole  40 mg Oral Daily    acetaminophen **OR** acetaminophen, albuterol, ondansetron **OR** ondansetron (ZOFRAN) IV, polyethylene glycol   Objective:   Vitals:   12/02/19 1956 12/03/19 0155 12/03/19 0425 12/03/19 0449  BP: (!) 161/59 94/67 (!) 159/73   Pulse: 66 67 67   Resp: 17 16 16    Temp: 97.9 F (36.6 C) 98.8 F (37.1 C) (!) 97.5 F (36.4 C)   TempSrc:  Oral Oral   SpO2: 100% 100% 100%   Weight:    115.8 kg  Height:        Intake/Output Summary (Last 24 hours) at 12/03/2019 1252 Last data filed at 12/03/2019 0900 Gross per 24 hour  Intake 3206.59 ml  Output --  Net 3206.59 ml   Filed Weights   12/01/19 2310 12/03/19 0449  Weight: (!) 117 kg 115.8 kg     Examination:   Physical Exam  Constitution: Awake alert, still complaining of abdominal discomfort, improved pain, no acute distress laying in bed comfortably Psychiatric: Normal mood, cooperative HEENT: Normocephalic, PERRL, otherwise with in Normal limits  Chest: Symmetric  cardio vascular:  S1/S2, RRR, No murmure, No Rubs or Gallops  pulmonary: Clear  to auscultation bilaterally, respirations unlabored, negative wheezes / crackles Abdomen: Soft, mildly distended, hypoactive bowel sounds, negative  Diffuse mild tenderness, positive rebound tenderness mild Muscular skeletal:  Limited exam, remained in bed, able to move all 4 extremities Neuro: CNII-XII intact. , normal motor and sensation, reflexes intact  Extremities: No pitting edema lower extremities, +2 pulses  Skin: Dry, warm to touch, negative for any Rashes, No open wounds Wounds: None visible please see nurses documentation for details    ------------------------------------------------------------------------------------------------------------------------------------------    LABs:  CBC Latest Ref Rng & Units 12/03/2019 12/01/2019 07/26/2019  WBC 4.0 - 10.5 K/uL 5.2 6.3 9.9  Hemoglobin 12.0 - 15.0 g/dL 10.6(L) 12.5 12.1  Hematocrit 36 - 46 % 32.7(L) 38.6 37.0  Platelets 150 -  400 K/uL 263 337 272   CMP Latest Ref Rng & Units 12/03/2019 12/03/2019 12/01/2019  Glucose 70 - 99 mg/dL - 734(L) 937(T)  BUN 8 - 23 mg/dL - <0(W) 10  Creatinine 0.44 - 1.00 mg/dL - 4.09 7.35  Sodium 329 - 145 mmol/L - 141 139  Potassium 3.5 - 5.1 mmol/L 2.6(LL) 2.2(LL) 2.2(LL)  Chloride 98 - 111 mmol/L - 100 95(L)  CO2 22 - 32 mmol/L - 31 32  Calcium 8.9 - 10.3 mg/dL - 8.1(L) 9.4  Total Protein 6.5 - 8.1 g/dL - - 7.4  Total Bilirubin 0.3 - 1.2 mg/dL - - 1.1  Alkaline Phos 38 - 126 U/L - - 67  AST 15 - 41 U/L - - 25  ALT 0 - 44 U/L - - 23       Micro Results Recent Results (from the past 240 hour(s))  SARS Coronavirus 2 by RT PCR (hospital order, performed in [facility] hospital lab) Nasopharyngeal Nasopharyngeal Swab     Status: None   Collection Time: 12/02/19  4:48 AM   Specimen: Nasopharyngeal Swab  Result Value Ref Range Status   SARS Coronavirus 2 NEGATIVE NEGATIVE Final    Comment: (NOTE) SARS-CoV-2 target nucleic acids are NOT DETECTED.  The SARS-CoV-2 RNA is generally detectable in  upper and lower respiratory specimens during the acute phase of infection. The lowest concentration of SARS-CoV-2 viral copies this assay can detect is 250 copies / mL. A negative result does not preclude SARS-CoV-2 infection and should not be used as the sole basis for treatment or other patient management decisions.  A negative result may occur with improper specimen collection / handling, submission of specimen other than nasopharyngeal swab, presence of viral mutation(s) within the areas targeted by this assay, and inadequate number of viral copies (<250 copies / mL). A negative result must be combined with clinical observations, patient history, and epidemiological information.  Fact Sheet for Patients:   BoilerBrush.com.cy  Fact Sheet for Healthcare Providers: https://pope.com/  This test is not yet approved or  cleared by the [place] FDA and has been authorized for detection and/or diagnosis of SARS-CoV-2 by FDA under an Emergency Use Authorization (EUA).  This EUA will remain in effect (meaning this test can be used) for the duration of the COVID-19 declaration under Section 564(b)(1) of the Act, 21 U.S.C. section 360bbb-3(b)(1), unless the authorization is terminated or revoked sooner.  Performed at [facility], [address], [place], [place] [address]     Radiology Reports DG Abdomen 1 View  Result Date: 12/01/2019 CLINICAL DATA:  Nausea and vomiting EXAM: ABDOMEN - 1 VIEW COMPARISON:  None. FINDINGS: There is a focal mildly dilated air-filled loops of bowel seen within the mid abdomen measuring to 5 cm. Air seen in nondilated loops of bowel within the mid abdomen. No radio-opaque calculi or other significant radiographic abnormality are seen. IMPRESSION: Nonspecific focally dilated loop of bowel within the mid abdomen which could represent a focal ileus. Electronically Signed   By: Jonna Clark M.D.   On:  12/01/2019 23:48   CT ABDOMEN PELVIS W CONTRAST  Result Date: 12/02/2019 CLINICAL DATA:  Abdominal abscess/infection suspected. Nausea and vomiting with severe epigastric pain for 3 days with watery diarrhea. EXAM: CT ABDOMEN AND PELVIS WITH CONTRAST TECHNIQUE: Multidetector CT imaging of the abdomen and pelvis was performed using the standard protocol following bolus administration of intravenous contrast. CONTRAST:  OMNIPAQUE IOHEXOL 300 MG/ML  SOLN COMPARISON:  10/16/2019 FINDINGS:  Lower chest:  No contributory findings. Hepatobiliary: No worrisome liver abnormality. A tiny low-density in the ventral right liver is most likely cyst given discrete appearance for size. Tiny calcified gallstone. No evidence of biliary inflammation or obstruction. Pancreas: Unremarkable. Spleen: Unremarkable. Adrenals/Urinary Tract: Negative adrenals. No hydronephrosis or stone. Unremarkable bladder. Stomach/Bowel: 2 midline incisional hernias containing loops of small bowel. Continued dilatation of proximal small bowel loops measuring up to 4.5 cm in diameter. Oral contrast was not administered. Prominent distal gastric wall thickness but circumferential and associated with luminal collapse. Small sliding hiatal hernia. Left colonic diverticulosis. Vascular/Lymphatic: No acute vascular abnormality. Area of fat necrosis in low and ventral left lower quadrant. No mass or adenopathy. Reproductive:3 cm calcified fibroid. Other: No ascites or pneumoperitoneum. Musculoskeletal: Lumbar spine degeneration with L5-S1 fusion and solid arthrodesis. IMPRESSION: Chronic small bowel containing midline hernias with chronic partial obstruction. Electronically Signed   By: Marnee Spring M.D.   On: 12/02/2019 04:36   DG Abd Portable 1V  Result Date: 12/03/2019 CLINICAL DATA:  Small-bowel obstruction. EXAM: PORTABLE ABDOMEN - 1 VIEW COMPARISON:  CT 12/02/2019. Abdomen 12/01/2019. FINDINGS: Persistent small-bowel distention, slightly  decreased from prior exam. Colonic gas pattern is normal. No free air. Prior lumbar spine fusion. Thoracolumbar spine scoliosis concave right. Calcified uterine fibroid again noted. Pelvic phleboliths again noted. IMPRESSION: Persistent small-bowel distention, slightly decreased from prior exam. Colonic gas pattern is normal. Findings again consistent with partial small bowel obstruction. No free air. Electronically Signed   By: Maisie Fus  Register   On: 12/03/2019 06:55    SIGNED: Kendell Bane, MD, FACP, FHM. [facility],  Pager (please use [email or web address] to page/text)  If 7PM-7AM, please contact night-coverage [email or web address] 12/03/2019, 12:52 PM

## 2019-12-03 NOTE — Progress Notes (Signed)
Paged on call Hospitalist Dr. Katherina Right to inform of Critical Kcl level of 2.2 that lab called at [phone]. Awaiting MD response. 12/03/2019 @ 0257 Manson Allan, RN

## 2019-12-03 NOTE — Plan of Care (Signed)
  Problem: Education: Goal: Knowledge of General Education information will improve Description: Including pain rating scale, medication(s)/side effects and non-pharmacologic comfort measures 12/03/2019 2034 by Nanine Means, RN Outcome: Progressing 12/03/2019 2032 by Nanine Means, RN Outcome: Progressing

## 2019-12-03 NOTE — Progress Notes (Signed)
CRITICAL VALUE ALERT  Critical Value:  K 2.6  Date & Time Notied:  12/03/19, 1130  Provider Notified: Dr. Flossie Dibble  Orders Received/Actions taken: Awaiting orders

## 2019-12-03 NOTE — Progress Notes (Signed)
Subjective: CC: Burning in arm  Patient reports no abdominal pain, n/v since I saw her last. Passing a good amount of flatus. No BM. On bedside commode and feels like she will need to have a BM soon. She notes some burning in her arm from the potassium.   Objective: Vital signs in last 24 hours: Temp:  [97.5 F (36.4 C)-98.8 F (37.1 C)] 97.5 F (36.4 C) (08/03 0425) Pulse Rate:  [65-74] 67 (08/03 0425) Resp:  [13-18] 16 (08/03 0425) BP: (94-161)/(52-96) 159/73 (08/03 0425) SpO2:  [85 %-100 %] 100 % (08/03 0425) Weight:  [115.8 kg] 115.8 kg (08/03 0449) Last BM Date: 12/01/19  Intake/Output from previous day: 08/02 0701 - 08/03 0700 In: 4266.6 [I.V.:2866.6; IV Piggyback:1400] Out: -  Intake/Output this shift: No intake/output data recorded.  PE: Gen:  Alert, NAD, pleasant Pulm: Rate and effort normal  Abd: Soft, ND, when on bedside commode there is noted hernia along midline wound. When patient lies flat on bed this self reduces. Mild soreness over this area reported but no tenderness. No peritonitis. +BS, no masses, or organomegaly. Midline wound well healed/  Ext:  No erythema, edema, or tenderness BUE/BLE  Psych: A&Ox3  Skin: no rashes noted, warm and dry   Lab Results:  Recent Labs    12/01/19 2318 12/03/19 0111  WBC 6.3 5.2  HGB 12.5 10.6*  HCT 38.6 32.7*  PLT 337 263   BMET Recent Labs    12/01/19 2318 12/03/19 0111  NA 139 141  K 2.2* 2.2*  CL 95* 100  CO2 32 31  GLUCOSE 134* 106*  BUN 10 <5*  CREATININE 0.71 0.59  CALCIUM 9.4 8.1*   PT/INR No results for input(s): LABPROT, INR in the last 72 hours. CMP     Component Value Date/Time   NA 141 12/03/2019 0111   NA 140 07/18/2019 1003   K 2.2 (LL) 12/03/2019 0111   CL 100 12/03/2019 0111   CO2 31 12/03/2019 0111   GLUCOSE 106 (H) 12/03/2019 0111   BUN <5 (L) 12/03/2019 0111   BUN 17 07/18/2019 1003   CREATININE 0.59 12/03/2019 0111   CALCIUM 8.1 (L) 12/03/2019 0111   PROT 7.4  12/01/2019 2318   PROT 7.3 07/18/2019 1003   ALBUMIN 4.2 12/01/2019 2318   ALBUMIN 4.6 07/18/2019 1003   AST 25 12/01/2019 2318   ALT 23 12/01/2019 2318   ALKPHOS 67 12/01/2019 2318   BILITOT 1.1 12/01/2019 2318   BILITOT 0.4 07/18/2019 1003   GFRNONAA >60 12/03/2019 0111   GFRAA >60 12/03/2019 0111   Lipase     Component Value Date/Time   LIPASE 34 12/01/2019 2318       Studies/Results: DG Abdomen 1 View  Result Date: 12/01/2019 CLINICAL DATA:  Nausea and vomiting EXAM: ABDOMEN - 1 VIEW COMPARISON:  None. FINDINGS: There is a focal mildly dilated air-filled loops of bowel seen within the mid abdomen measuring to 5 cm. Air seen in nondilated loops of bowel within the mid abdomen. No radio-opaque calculi or other significant radiographic abnormality are seen. IMPRESSION: Nonspecific focally dilated loop of bowel within the mid abdomen which could represent a focal ileus. Electronically Signed   By: Jonna Clark M.D.   On: 12/01/2019 23:48   CT ABDOMEN PELVIS W CONTRAST  Result Date: 12/02/2019 CLINICAL DATA:  Abdominal abscess/infection suspected. Nausea and vomiting with severe epigastric pain for 3 days with watery diarrhea. EXAM: CT ABDOMEN AND PELVIS WITH CONTRAST TECHNIQUE:  Multidetector CT imaging of the abdomen and pelvis was performed using the standard protocol following bolus administration of intravenous contrast. CONTRAST:  OMNIPAQUE IOHEXOL 300 MG/ML  SOLN COMPARISON:  10/16/2019 FINDINGS: Lower chest:  No contributory findings. Hepatobiliary: No worrisome liver abnormality. A tiny low-density in the ventral right liver is most likely cyst given discrete appearance for size. Tiny calcified gallstone. No evidence of biliary inflammation or obstruction. Pancreas: Unremarkable. Spleen: Unremarkable. Adrenals/Urinary Tract: Negative adrenals. No hydronephrosis or stone. Unremarkable bladder. Stomach/Bowel: 2 midline incisional hernias containing loops of small bowel. Continued  dilatation of proximal small bowel loops measuring up to 4.5 cm in diameter. Oral contrast was not administered. Prominent distal gastric wall thickness but circumferential and associated with luminal collapse. Small sliding hiatal hernia. Left colonic diverticulosis. Vascular/Lymphatic: No acute vascular abnormality. Area of fat necrosis in low and ventral left lower quadrant. No mass or adenopathy. Reproductive:3 cm calcified fibroid. Other: No ascites or pneumoperitoneum. Musculoskeletal: Lumbar spine degeneration with L5-S1 fusion and solid arthrodesis. IMPRESSION: Chronic small bowel containing midline hernias with chronic partial obstruction. Electronically Signed   By: Marnee Spring M.D.   On: 12/02/2019 04:36   DG Abd Portable 1V  Result Date: 12/03/2019 CLINICAL DATA:  Small-bowel obstruction. EXAM: PORTABLE ABDOMEN - 1 VIEW COMPARISON:  CT 12/02/2019. Abdomen 12/01/2019. FINDINGS: Persistent small-bowel distention, slightly decreased from prior exam. Colonic gas pattern is normal. No free air. Prior lumbar spine fusion. Thoracolumbar spine scoliosis concave right. Calcified uterine fibroid again noted. Pelvic phleboliths again noted. IMPRESSION: Persistent small-bowel distention, slightly decreased from prior exam. Colonic gas pattern is normal. Findings again consistent with partial small bowel obstruction. No free air. Electronically Signed   By: Maisie Fus  Register   On: 12/03/2019 06:55    Anti-infectives: Anti-infectives (From admission, onward)   None       Assessment/Plan HTN HLD GERD Hypokalemia  - Per [facility] -   Chronic small bowel containing midline hernias Chronic pSBO - Hx open rectrorectus ventral hernia repair by Dr. Derrell Lolling on 06/15/16 - Hx  exploratory laparotomy, lysis of adhesions, enterorrhaphy, incisional hernia repair by Dr. Sheliah Hatch 09/11/2017 - Hx recurrent incarcerated ventral incisional hernia and lysis of adhesions by Dr. Donell Beers 02/14/2019 - Hx  exploratory  laparotomy with primary repair of recurrent incisional hernia by Dr. Cliffton Asters on 07/25/2019 - CT scan with chronic small bowel containing midline hernia and psbo. Similar in appearance to CT 6/16. There are some dilated small bowel loops noted on CT - Xray with some dilated portions of small bowel this AM.  - No indication for emergency surgery at this time.  - Patients symptoms have resolved (no abdominal pain, n/v x 24 hours) and is having bowel function. Will give trial of CLD. If she develops nausea, emesis, abdominal bloating/pain, will make NPO, place NGT and order SBO protocol.  - Hopefully can avoid surgery as inpatient. She has been following with Dr. Sheliah Hatch as outpatient (last visit 10/30/19) and trying to lose weight before pursuing elective repair - TR[facility] correcting electrolyte abnormalities. Recommend K > 4 and Mg > 2 for bowel function. Potassium this AM was 2.2 - Mobilize for bowel function - We will follow along with you  FEN - CLD, IVF, replace electrolytes  VTE - SCDs, heparin subq ID - None    LOS: 1 day    Jacinto Halim , [facility] 12/03/2019, 9:12 AM Please see Amion for pager number during day hours 7:00am-4:30pm

## 2019-12-04 ENCOUNTER — Other Ambulatory Visit: Payer: Self-pay

## 2019-12-04 ENCOUNTER — Encounter (HOSPITAL_COMMUNITY): Payer: Self-pay | Admitting: Family Medicine

## 2019-12-04 DIAGNOSIS — K219 Gastro-esophageal reflux disease without esophagitis: Secondary | ICD-10-CM

## 2019-12-04 LAB — CBC
HCT: 31.4 % — ABNORMAL LOW (ref 36.0–46.0)
Hemoglobin: 10.3 g/dL — ABNORMAL LOW (ref 12.0–15.0)
MCH: 28.4 pg (ref 26.0–34.0)
MCHC: 32.8 g/dL (ref 30.0–36.0)
MCV: 86.5 fL (ref 80.0–100.0)
Platelets: 257 10*3/uL (ref 150–400)
RBC: 3.63 MIL/uL — ABNORMAL LOW (ref 3.87–5.11)
RDW: 13.9 % (ref 11.5–15.5)
WBC: 5.6 10*3/uL (ref 4.0–10.5)
nRBC: 0 % (ref 0.0–0.2)

## 2019-12-04 LAB — BASIC METABOLIC PANEL
Anion gap: 8 (ref 5–15)
BUN: <5 mg/dL — ABNORMAL LOW (ref 8–23)
CO2: 28 mmol/L (ref 22–32)
Calcium: 8.4 mg/dL — ABNORMAL LOW (ref 8.9–10.3)
Chloride: 104 mmol/L (ref 98–111)
Creatinine, Ser: 0.53 mg/dL (ref 0.44–1.00)
GFR calc Af Amer: >60 mL/min (ref >60)
GFR calc non Af Amer: >60 mL/min (ref >60)
Glucose, Bld: 118 mg/dL — ABNORMAL HIGH (ref 70–99)
Potassium: 2.8 mmol/L — ABNORMAL LOW (ref 3.5–5.1)
Sodium: 140 mmol/L (ref 135–145)

## 2019-12-04 LAB — POTASSIUM: Potassium: 3.6 mmol/L (ref 3.5–5.1)

## 2019-12-04 MED ORDER — POTASSIUM CHLORIDE 10 MEQ/100ML IV SOLN
10.0000 meq | INTRAVENOUS | Status: AC
  Administered 2019-12-04 (×4): 10 meq via INTRAVENOUS
  Filled 2019-12-04 (×4): qty 100

## 2019-12-04 MED ORDER — POTASSIUM CHLORIDE CRYS ER 20 MEQ PO TBCR
40.0000 meq | EXTENDED_RELEASE_TABLET | Freq: Once | ORAL | Status: AC
  Administered 2019-12-04: 40 meq via ORAL
  Filled 2019-12-04: qty 2

## 2019-12-04 NOTE — Progress Notes (Signed)
Subjective: CC: Doing well. No abdominal pain, n/v. Tolerating CLD. Passing flatus. BM yesterday.   Objective: Vital signs in last 24 hours: Temp:  [98 F (36.7 C)-98.4 F (36.9 C)] 98.4 F (36.9 C) (08/04 0547) Pulse Rate:  [63-67] 63 (08/04 0547) Resp:  [17-20] 17 (08/04 0547) BP: (138-155)/(58-70) 138/61 (08/04 0547) SpO2:  [100 %] 100 % (08/04 0547) Weight:  [116.1 kg] 116.1 kg (08/04 0500) Last BM Date: 12/03/19  Intake/Output from previous day: 08/03 0701 - 08/04 0700 In: 3070.2 [P.O.:720; I.V.:2114.3; IV Piggyback:235.9] Out: -  Intake/Output this shift: No intake/output data recorded.  PE: Gen:  Alert, NAD, pleasant Pulm: Rate and effort normal  ZOX:WRUE, ND,NT, No peritonitis.+BS, no masses, or organomegaly. Midline wound well healed. Chronic midline hernia reducible.  Ext:  No erythema, edema, or tenderness BUE/BLE  Psych: A&Ox3  Skin: no rashes noted, warm and dry  Lab Results:  Recent Labs    12/03/19 0111 12/04/19 0304  WBC 5.2 5.6  HGB 10.6* 10.3*  HCT 32.7* 31.4*  PLT 263 257   BMET Recent Labs    12/03/19 0111 12/03/19 0111 12/03/19 1001 12/04/19 0304  NA 141  --   --  140  K 2.2*   < > 2.6* 2.8*  CL 100  --   --  104  CO2 31  --   --  28  GLUCOSE 106*  --   --  118*  BUN <5*  --   --  <5*  CREATININE 0.59  --   --  0.53  CALCIUM 8.1*  --   --  8.4*   < > = values in this interval not displayed.   PT/INR No results for input(s): LABPROT, INR in the last 72 hours. CMP     Component Value Date/Time   NA 140 12/04/2019 0304   NA 140 07/18/2019 1003   K 2.8 (L) 12/04/2019 0304   CL 104 12/04/2019 0304   CO2 28 12/04/2019 0304   GLUCOSE 118 (H) 12/04/2019 0304   BUN <5 (L) 12/04/2019 0304   BUN 17 07/18/2019 1003   CREATININE 0.53 12/04/2019 0304   CALCIUM 8.4 (L) 12/04/2019 0304   PROT 7.4 12/01/2019 2318   PROT 7.3 07/18/2019 1003   ALBUMIN 4.2 12/01/2019 2318   ALBUMIN 4.6 07/18/2019 1003   AST 25 12/01/2019 2318    ALT 23 12/01/2019 2318   ALKPHOS 67 12/01/2019 2318   BILITOT 1.1 12/01/2019 2318   BILITOT 0.4 07/18/2019 1003   GFRNONAA >60 12/04/2019 0304   GFRAA >60 12/04/2019 0304   Lipase     Component Value Date/Time   LIPASE 34 12/01/2019 2318       Studies/Results: DG Abd Portable 1V  Result Date: 12/03/2019 CLINICAL DATA:  Small-bowel obstruction. EXAM: PORTABLE ABDOMEN - 1 VIEW COMPARISON:  CT 12/02/2019. Abdomen 12/01/2019. FINDINGS: Persistent small-bowel distention, slightly decreased from prior exam. Colonic gas pattern is normal. No free air. Prior lumbar spine fusion. Thoracolumbar spine scoliosis concave right. Calcified uterine fibroid again noted. Pelvic phleboliths again noted. IMPRESSION: Persistent small-bowel distention, slightly decreased from prior exam. Colonic gas pattern is normal. Findings again consistent with partial small bowel obstruction. No free air. Electronically Signed   By: Maisie Fus  Register   On: 12/03/2019 06:55    Anti-infectives: Anti-infectives (From admission, onward)   None       Assessment/Plan HTN HLD GERD Hypokalemia  - Per [facility] -   Chronic small bowel containing midline hernias  Chronic pSBO - Hx open rectrorectus ventral hernia repair by Dr. Derrell Lolling on 06/15/16 - Hxexploratory laparotomy, lysis of adhesions, enterorrhaphy, incisional hernia repairby Dr. Sheliah Hatch 09/11/2017 - Hx recurrent incarcerated ventral incisional hernia and lysis of adhesionsby Dr. Donell Beers 02/14/2019 - Hx exploratory laparotomy with primary repair of recurrent incisional herniaby Dr. Cliffton Asters on 07/25/2019 - CT scan on admission with chronic small bowel containing midline hernia and psbo. Similar in appearance to CT 6/16.  - No indication for emergency surgery at this time.  - Adv to FLD - Follow up with Dr. Sheliah Hatch as outpatient (last visit 10/30/19). She has been following with him as an outpatient and trying to lose weight before pursuing elective repair -  TRH correcting electrolyte abnormalities. Recommend K > 4 and Mg > 2 for bowel function. Potassium this AM was 2.8 - Mobilize for bowel function - We will follow along with you  FEN -FLD, IVF, replace electrolytes  VTE -SCDs, heparin subq ID -None   Plan: Adv diet. Okay to d/c on FLD and adv to soft diet at home. Will reach out to [facility] and let them know. Follow up Dr. Sheliah Hatch.    LOS: 2 days    Jacinto Halim , [facility] 12/04/2019, 10:05 AM Please see Amion for pager number during day hours 7:00am-4:30pm

## 2019-12-04 NOTE — Discharge Instructions (Signed)
Full Liquid Diet Please follow this diet at home for the next few days and then transition to a soft diet A full liquid diet refers to fluids and foods that are liquid or will become liquid at room temperature. This diet should only be used for a short period of time to help you recover from illness or surgery. Your health care provider or dietitian will help you determine when it is safe to eat regular foods. What are tips for following this plan?     Reading food labels  Check food labels of nutrition shakes for the amount of protein. Look for nutrition shakes that have at least 8-10 grams of protein in each serving.  Look for drinks, such as milks and juices, that are "fortified" or "enriched." This means that vitamins and minerals have been added. Shopping  Buy premade nutritional shakes to keep on hand.  To vary your choices, buy different flavors of milks and shakes. Meal planning  Choose flavors and foods that you enjoy.  To make sure you get enough energy from food (calories): ? Eat 3 full liquid meals each day. Have a liquid snack between each meal. ? Drink 6-8 ounces (177-237 ml) of a nutrition supplement shake with meals or as snacks. ? Add protein powder, powdered milk, milk, or yogurt to shakes to increase the amount of protein.  Drink at least one serving a day of citrus fruit juice or fruit juice that has vitamin C added. General guidelines  Before starting the full-liquid diet, check with your health care provider to know what foods you should avoid. These may include full-fat or high-fiber liquids.  You may have any liquid or food that becomes a liquid at room temperature. The food is considered a liquid if it can be poured off a spoon at room temperature.  Do not drink alcohol unless approved by your health care provider.  This diet gives you most of the nutrients that you need for energy, but you may not get enough of certain vitamins, minerals, and fiber. Make  sure to talk to your health care provider or dietitian about: ? How many calories you need to eat get day. ? How much fluid you should have each day. ? Taking a multivitamin or a nutritional supplement. What foods are allowed? The items listed may not be a complete list. Talk with your dietitian about what dietary choices are best for you. Grains Thin hot cereal, such as cream of wheat. Soft-cooked pasta or rice pured in soup. Vegetables Pulp-free tomato or vegetable juice. Vegetables pured in soup. Fruits Fruit juice without pulp. Strained fruit pures (seeds and skins removed). Meats and other protein foods Beef, chicken, and fish broths. Powdered protein supplements. Dairy Milk and milk-based beverages, including milk shakes and instant breakfast mixes. Smooth yogurt. Pured cottage cheese. Beverages Water. Coffee and tea (caffeinated or decaffeinated). Cocoa. Liquid nutritional supplements. Soft drinks. Nondairy milks, such as almond, coconut, rice, or soy milk. Fats and oils Melted margarine and butter. Cream. Canola, almond, avocado, corn, grapeseed, sunflower, and sesame oils. Gravy. Sweets and desserts Custard. Pudding. Flavored gelatin. Smooth ice cream (without nuts or candy pieces). Sherbet. Popsicles. [place] ice. Pudding pops. Seasoning and other foods Salt and pepper. Spices. Cocoa powder. Vinegar. Ketchup. Yellow mustard. Smooth sauces, such as [place], cheese sauce, or white sauce. Soy sauce. Cream soups. Strained soups. Syrup. Honey. Jelly (without fruit pieces). What foods are not allowed? The items listed may not be a complete list. Talk with your  dietitian about what dietary choices are best for you. Grains Whole grains. Pasta. Rice. Cold cereal. Bread. Crackers. Vegetables All whole fresh, frozen, or canned vegetables. Fruits All whole fresh, frozen, or canned fruits. Meats and other protein foods All cuts of meat, poultry, and fish. Eggs. Tofu and soy  protein. Nuts and nut butters. Lunch meat. Sausage. Dairy Hard cheese. Yogurt with fruit chunks. Fats and oils Coconut oil. Palm oil. Lard. Cold butter. Sweets and desserts Ice cream or other frozen desserts that have any solids in them or on top, such as nuts, chocolate chips, and pieces of cookies. Cakes. Cookies. Candy. Seasoning and other foods Stone-ground mustards. Soups with chunks or pieces. Summary  A full liquid diet refers to fluids and foods that are liquid or will become liquid at room temperature.  This diet should only be used for a short period of time to help you recover from illness or surgery. Ask your health care provider or dietitian when it is safe for you to eat regular foods.  To make sure you get enough calories and nutrients, eat 3 meals each day with snacks between. Drink premade nutrition supplement shakes or add protein powder to homemade shakes. Take a vitamin and mineral supplement as told by your health care provider. This information is not intended to replace advice given to you by your health care provider. Make sure you discuss any questions you have with your health care provider. Document Revised: 07/15/2017 Document Reviewed: 06/01/2016 Elsevier Patient Education  2020 Elsevier Inc.   Soft-Food Eating Plan A soft-food eating plan includes foods that are safe and easy to chew and swallow. Your health care provider or dietitian can help you find foods and flavors that fit into this plan. Follow this plan until your health care provider or dietitian says it is safe to start eating other foods and food textures. What are tips for following this plan? General guidelines   Take small bites of food, or cut food into pieces about  inch or smaller. Bite-sized pieces of food are easier to chew and swallow.  Eat moist foods. Avoid overly dry foods.  Avoid foods that: ? Are difficult to swallow, such as dry, chunky, crispy, or sticky foods. ? Are  difficult to chew, such as hard, tough, or stringy foods. ? Contain nuts, seeds, or fruits.  Follow instructions from your dietitian about the types of liquids that are safe for you to swallow. You may be allowed to have: ? Thick liquids only. This includes only liquids that are thicker than honey. ? Thin and thick liquids. This includes all beverages and foods that become liquid at room temperature.  To make thick liquids: ? Purchase a commercial liquid thickening powder. These are available at grocery stores and pharmacies. ? Mix the thickener into liquids according to instructions on the label. ? Purchase ready-made thickened liquids. ? Thicken soup by pureeing, straining to remove chunks, and adding flour, potato flakes, or corn starch. ? Add commercial thickener to foods that become liquid at room temperature, such as milk shakes, yogurt, ice cream, gelatin, and sherbet.  Ask your health care provider whether you need to take a fiber supplement. Cooking  Cook meats so they stay tender and moist. Use methods like braising, stewing, or baking in liquid.  Cook vegetables and fruit until they are soft enough to be mashed with a fork.  Peel soft, fresh fruits such as peaches, nectarines, and melons.  When making soup, make sure chunks of meat  and vegetables are smaller than  inch.  Reheat leftover foods slowly so that a tough crust does not form. What foods are allowed? The items listed below may not be a complete list. Talk with your dietitian about what dietary choices are best for you. Grains Breads, muffins, pancakes, or waffles moistened with syrup, jelly, or butter. Dry cereals well-moistened with milk. Moist, cooked cereals. Well-cooked pasta and rice. Vegetables All soft-cooked vegetables. Shredded lettuce. Fruits All canned and cooked fruits. Soft, peeled fresh fruits. Strawberries. Dairy Milk. Cream. Yogurt. Cottage cheese. Soft cheese without the rind. Meats and other  protein foods Tender, moist ground meat, poultry, or fish. Meat cooked in gravy or sauces. Eggs. Sweets and desserts Ice cream. Milk shakes. Sherbet. Pudding. Fats and oils Butter. Margarine. Olive, canola, sunflower, and grapeseed oil. Smooth salad dressing. Smooth cream cheese. Mayonnaise. Gravy. What foods are not allowed? The items listed bemay not be a complete list. Talk with your dietitian about what dietary choices are best for you. Grains Coarse or dry cereals, such as bran, granola, and shredded wheat. Tough or chewy crusty breads, such as [place] bread or baguettes. Breads with nuts, seeds, or fruit. Vegetables All raw vegetables. Cooked corn. Cooked vegetables that are tough or stringy. Tough, crisp, fried potatoes and potato skins. Fruits Fresh fruits with skins or seeds, or both, such as apples, pears, and grapes. Stringy, high-pulp fruits, such as papaya, pineapple, coconut, and mango. Fruit leather and all dried fruit. Dairy Yogurt with nuts or coconut. Meats and other protein foods Hard, dry sausages. Dry meat, poultry, or fish. Meats with gristle. Fish with bones. Fried meat or fish. Lunch meat and hotdogs. Nuts and seeds. Chunky peanut butter or other nut butters. Sweets and desserts Cakes or cookies that are very dry or chewy. Desserts with dried fruit, nuts, or coconut. Fried pastries. Very rich pastries. Fats and oils Cream cheese with fruit or nuts. Salad dressings with seeds or chunks. Summary  A soft-food eating plan includes foods that are safe and easy to swallow. Generally, the foods should be soft enough to be mashed with a fork.  Avoid foods that are dry, hard to chew, crunchy, sticky, stringy, or crispy.  Ask your health care provider whether you need to thicken your liquids and if you need to take a fiber supplement. This information is not intended to replace advice given to you by your health care provider. Make sure you discuss any questions you have  with your health care provider. Document Revised: 08/09/2018 Document Reviewed: 06/21/2016 Elsevier Patient Education  2020 [employer].

## 2019-12-04 NOTE — Progress Notes (Signed)
PROGRESS NOTE    Rhonda [name]  MBW:[id number] DOB: 11-Oct-1950 DOA: 12/01/2019 PCP: Rhonda Ham, PA-C    Brief Narrative:  LurettaGallowayis a69 y.o.female,with history of Thyroid nodule, hypertension, hyperlipidemia, GERD, hernia, 3 small bowel obstructions, and more presents to the ER with a chief complaint of abdominal pain, nausea, vomiting. Patient reports that she started having diarrhea nausea and vomiting on Friday (3 days ago). She reports that for the past 2 days there has only been an hour between finishing dinner and emesis.  Her and her family became more concerned today because of a noticed that her emesis was getting "mercury" and starting to smell like feces.   ED course Temperature 98.2, blood pressure 154/66, heart rate 63, respiratory 19 Rx w IV Zofran, KCl, normal saline Covid test  -negative WBC  6.3, hemoglobin 12.5, CHEM panel is grossly unremarkable except for hypokalemia at 2.2 UA shows rare bacteria, 0-5 squamous cell CT abdomen pelvis shows chronic small polyp containing midline hernia with chronic partial obstruction X-ray abdomen shows nonspecific dilated loop of bowel EKG shows heart rate 70 QTC 455 no acute ischemic changes  Hospital course: 12/02/2019 -n.p.o., still no NG tube placement, general surgery following, IV fluids Nausea vomiting and diarrhea has improved  12/03/2019 -patient remains n.p.o., on IV fluids, no NG tube in place, GI following, reporting gas but no BMs yet    Consultants:   General surgery  Procedures:   Antimicrobials:       Subjective: Intermittent liquidy bowel.  No vomiting or nausea.  Tolerating clears  Objective: Vitals:   12/03/19 2022 12/03/19 2053 12/04/19 0500 12/04/19 0547  BP:  (!) 155/70  138/61  Pulse:  67  63  Resp: 18 18  17   Temp:  98 F (36.7 C)  98.4 F (36.9 C)  TempSrc:  Oral  Oral  SpO2:  100%  100%  Weight:   116.1 kg   Height:        Intake/Output  Summary (Last 24 hours) at 12/04/2019 0848 Last data filed at 12/04/2019 0300 Gross per 24 hour  Intake 3070.17 ml  Output --  Net 3070.17 ml   Filed Weights   12/01/19 2310 12/03/19 0449 12/04/19 0500  Weight: (!) 117 kg 115.8 kg 116.1 kg    Examination:  General exam: Appears calm and comfortable  Respiratory system: Clear to auscultation. Respiratory effort normal. Cardiovascular system: S1 & S2 heard, RRR. No JVD, murmurs, rubs, gallops or clicks.  Gastrointestinal system: Abdomen is nondistended, soft and nontender.  Normal bowel sounds heard. Central nervous system: Alert and oriented.  Grossly intact Extremities: No edema Skin: Warm dry Psychiatry: Judgement and insight appear normal. Mood & affect appropriate.     Data Reviewed: I have personally reviewed following labs and imaging studies  CBC: Recent Labs  Lab 12/01/19 2318 12/03/19 0111 12/04/19 0304  WBC 6.3 5.2 5.6  HGB 12.5 10.6* 10.3*  HCT 38.6 32.7* 31.4*  MCV 87.7 86.5 86.5  PLT 337 263 257   Basic Metabolic Panel: Recent Labs  Lab 12/01/19 2318 12/02/19 0835 12/03/19 0111 12/03/19 1001 12/04/19 0304  NA 139  --  141  --  140  K 2.2*  --  2.2* 2.6* 2.8*  CL 95*  --  100  --  104  CO2 32  --  31  --  28  GLUCOSE 134*  --  106*  --  118*  BUN 10  --  <5*  --  <5*  CREATININE 0.71  --  0.59  --  0.53  CALCIUM 9.4  --  8.1*  --  8.4*  MG  --  1.6*  --  1.6*  --    GFR: Estimated Creatinine Clearance: 80.5 mL/min (by C-G formula based on SCr of 0.53 mg/dL). Liver Function Tests: Recent Labs  Lab 12/01/19 2318  AST 25  ALT 23  ALKPHOS 67  BILITOT 1.1  PROT 7.4  ALBUMIN 4.2   Recent Labs  Lab 12/01/19 2318  LIPASE 34   No results for input(s): AMMONIA in the last 168 hours. Coagulation Profile: No results for input(s): INR, PROTIME in the last 168 hours. Cardiac Enzymes: No results for input(s): CKTOTAL, CKMB, CKMBINDEX, TROPONINI in the last 168 hours. BNP (last 3 results) No  results for input(s): PROBNP in the last 8760 hours. HbA1C: No results for input(s): HGBA1C in the last 72 hours. CBG: No results for input(s): GLUCAP in the last 168 hours. Lipid Profile: No results for input(s): CHOL, HDL, LDLCALC, TRIG, CHOLHDL, LDLDIRECT in the last 72 hours. Thyroid Function Tests: No results for input(s): TSH, T4TOTAL, FREET4, T3FREE, THYROIDAB in the last 72 hours. Anemia Panel: No results for input(s): VITAMINB12, FOLATE, FERRITIN, TIBC, IRON, RETICCTPCT in the last 72 hours. Sepsis Labs: No results for input(s): PROCALCITON, LATICACIDVEN in the last 168 hours.  Recent Results (from the past 240 hour(s))  SARS Coronavirus 2 by RT PCR (hospital order, performed in [facility] hospital lab) Nasopharyngeal Nasopharyngeal Swab     Status: None   Collection Time: 12/02/19  4:48 AM   Specimen: Nasopharyngeal Swab  Result Value Ref Range Status   SARS Coronavirus 2 NEGATIVE NEGATIVE Final    Comment: (NOTE) SARS-CoV-2 target nucleic acids are NOT DETECTED.  The SARS-CoV-2 RNA is generally detectable in upper and lower respiratory specimens during the acute phase of infection. The lowest concentration of SARS-CoV-2 viral copies this assay can detect is 250 copies / mL. A negative result does not preclude SARS-CoV-2 infection and should not be used as the sole basis for treatment or other patient management decisions.  A negative result may occur with improper specimen collection / handling, submission of specimen other than nasopharyngeal swab, presence of viral mutation(s) within the areas targeted by this assay, and inadequate number of viral copies (<250 copies / mL). A negative result must be combined with clinical observations, patient history, and epidemiological information.  Fact Sheet for Patients:   BoilerBrush.com.cy  Fact Sheet for Healthcare Providers: https://pope.com/  This test is not yet approved  or  cleared by the [place] FDA and has been authorized for detection and/or diagnosis of SARS-CoV-2 by FDA under an Emergency Use Authorization (EUA).  This EUA will remain in effect (meaning this test can be used) for the duration of the COVID-19 declaration under Section 564(b)(1) of the Act, 21 U.S.C. section 360bbb-3(b)(1), unless the authorization is terminated or revoked sooner.  Performed at [facility], [address], [place], [place] [address]          Radiology Studies: DG Abd Portable 1V  Result Date: 12/03/2019 CLINICAL DATA:  Small-bowel obstruction. EXAM: PORTABLE ABDOMEN - 1 VIEW COMPARISON:  CT 12/02/2019. Abdomen 12/01/2019. FINDINGS: Persistent small-bowel distention, slightly decreased from prior exam. Colonic gas pattern is normal. No free air. Prior lumbar spine fusion. Thoracolumbar spine scoliosis concave right. Calcified uterine fibroid again noted. Pelvic phleboliths again noted. IMPRESSION: Persistent small-bowel distention, slightly decreased from prior exam. Colonic gas pattern is normal. Findings again consistent with partial  small bowel obstruction. No free air. Electronically Signed   By: Maisie Fus  Register   On: 12/03/2019 06:55        Scheduled Meds: . atorvastatin  10 mg Oral Daily  . dorzolamide-timolol  1 drop Both Eyes BID  . fluticasone  1 spray Each Nare Daily  . heparin  5,000 Units Subcutaneous Q8H  . losartan  100 mg Oral Daily  . multivitamin with minerals  1 tablet Oral Daily  . pantoprazole  40 mg Oral Daily   Continuous Infusions: . dextrose 5 % and 0.45 % NaCl with KCl 10 mEq/L 100 mL/hr at 12/04/19 0011  . potassium chloride      Assessment & Plan:   Active Problems:   SBO (small bowel obstruction) (HCC)   Hypokalemia due to excessive gastrointestinal loss of potassium   Chronic small bowel obstruction -Patient is abdomen remain soft, reporting gas but no bowel movements yet -General surgery following,  continue conservative management - Chronic obstruction seen on CT -Nausea vomiting has improved, no NG tube was placed -General surgery following-started on full liquid diet today Replacing electrolytes Note:  Dr. Derrell Lolling on 2/14/18She underwent an open rectrorectus ventral hernia repair  Dr. Sheliah Hatch 09/11/2017 - She underwentexploratory laparotomy, lysis of adhesions, enterorrhaphy, incisional hernia repair. Dr. Donell Beers 02/14/2019 - She underwernt repair of recurrent incarcerated ventral incisional hernia and lysis of adhesions.   Dr. Cliffton Asters on 07/25/2019 - She underwent exploratory laparotomy with primary repair of recurrent incisional hernia She is following with Dr. Sheliah Hatch   Hypokalemia Chryl Heck 1. 2/2 GI losses 2. Magnesium was replaced 3. Potassium 2.8, will give IV and p.o. 4. Check a.m. labs  Nausea/vomiting/ diarrhea 1. Improving 2. possibly 2/2 SBO 3. History is not consistent with food poisoning 4. Vaccinated for covid 5. No WBC acct 6. Most likely mechanical as in SBO 7. Overall improved, with some liquidy stool  GERD 1. Continue PPI   DVT prophylaxis: Heparin subcu Code Status: Full Family Communication: None at bedside Disposition Plan: Home Status is: Inpatient  Remains inpatient appropriate because:Persistent severe electrolyte disturbances   Dispo: The patient is from: Home              Anticipated d/c is to: Home              Anticipated d/c date is: 1 day              Patient currently is not medically stable to d/c.            LOS: 2 days   Time spent: 35 minutes with more than 50% on COC   Lynn Ito, MD [facility] Pager [phone]  If 7PM-7AM, please contact night-coverage [email or web address] Password [facility] 12/04/2019, 8:48 AM

## 2019-12-05 LAB — CBC
HCT: 32.5 % — ABNORMAL LOW (ref 36.0–46.0)
Hemoglobin: 10.6 g/dL — ABNORMAL LOW (ref 12.0–15.0)
MCH: 28.8 pg (ref 26.0–34.0)
MCHC: 32.6 g/dL (ref 30.0–36.0)
MCV: 88.3 fL (ref 80.0–100.0)
Platelets: 274 10*3/uL (ref 150–400)
RBC: 3.68 MIL/uL — ABNORMAL LOW (ref 3.87–5.11)
RDW: 14.1 % (ref 11.5–15.5)
WBC: 5 10*3/uL (ref 4.0–10.5)
nRBC: 0 % (ref 0.0–0.2)

## 2019-12-05 LAB — MAGNESIUM: Magnesium: 1.8 mg/dL (ref 1.7–2.4)

## 2019-12-05 LAB — POTASSIUM: Potassium: 3.2 mmol/L — ABNORMAL LOW (ref 3.5–5.1)

## 2019-12-05 MED ORDER — POTASSIUM CHLORIDE 10 MEQ/100ML IV SOLN
10.0000 meq | INTRAVENOUS | Status: AC
  Administered 2019-12-05 (×3): 10 meq via INTRAVENOUS
  Filled 2019-12-05 (×3): qty 100

## 2019-12-05 MED ORDER — ESOMEPRAZOLE MAGNESIUM 40 MG PO CPDR: 40.0000 mg | DELAYED_RELEASE_CAPSULE | Freq: Every day | ORAL | 0 refills | Status: DC

## 2019-12-05 MED ORDER — POLYETHYLENE GLYCOL 3350 17 G PO PACK: 17.0000 g | PACK | Freq: Every day | ORAL | 0 refills | Status: DC | PRN

## 2019-12-05 MED ORDER — MAGNESIUM SULFATE 2 GM/50ML IV SOLN
2.0000 g | Freq: Once | INTRAVENOUS | Status: AC
  Administered 2019-12-05: 2 g via INTRAVENOUS
  Filled 2019-12-05: qty 50

## 2019-12-05 MED ORDER — POTASSIUM CHLORIDE CRYS ER 20 MEQ PO TBCR
40.0000 meq | EXTENDED_RELEASE_TABLET | Freq: Once | ORAL | Status: AC
  Administered 2019-12-05: 40 meq via ORAL
  Filled 2019-12-05: qty 2

## 2019-12-05 NOTE — Progress Notes (Signed)
Subjective: CC: Doing well. No abdominal pain, n/v. Tolerating FLD. Passing flatus. BM x 2 yesterday that was soft. No diarrhea.  Objective: Vital signs in last 24 hours: Temp:  [97.8 F (36.6 C)-97.9 F (36.6 C)] 97.9 F (36.6 C) (08/05 0322) Pulse Rate:  [61-74] 74 (08/05 0322) Resp:  [18-20] 20 (08/05 0322) BP: (129-153)/(63-67) 129/63 (08/05 0322) SpO2:  [100 %] 100 % (08/05 0322) Weight:  [119.5 kg] 119.5 kg (08/05 0322) Last BM Date: 12/04/19  Intake/Output from previous day: 08/04 0701 - 08/05 0700 In: 3748.4 [P.O.:600; I.V.:2748.4; IV Piggyback:400] Out: -  Intake/Output this shift: No intake/output data recorded.  PE: Gen: Alert, NAD, pleasant Pulm: Rate and effort normal HYQ:MVHQ, ND,NT,No peritonitis.+BS, no masses, or organomegaly. Midline wound well healed. Chronic midline hernia reducible.  Ext: No erythema, edema, or tenderness BUE/BLE  Psych: A&Ox3  Skin: no rashes noted, warm and dry  Lab Results:  Recent Labs    12/04/19 0304 12/05/19 0514  WBC 5.6 5.0  HGB 10.3* 10.6*  HCT 31.4* 32.5*  PLT 257 274   BMET Recent Labs    12/03/19 0111 12/03/19 1001 12/04/19 0304 12/04/19 0304 12/04/19 1454 12/05/19 0514  NA 141  --  140  --   --   --   K 2.2*   < > 2.8*   < > 3.6 3.2*  CL 100  --  104  --   --   --   CO2 31  --  28  --   --   --   GLUCOSE 106*  --  118*  --   --   --   BUN <5*  --  <5*  --   --   --   CREATININE 0.59  --  0.53  --   --   --   CALCIUM 8.1*  --  8.4*  --   --   --    < > = values in this interval not displayed.   PT/INR No results for input(s): LABPROT, INR in the last 72 hours. CMP     Component Value Date/Time   NA 140 12/04/2019 0304   NA 140 07/18/2019 1003   K 3.2 (L) 12/05/2019 0514   CL 104 12/04/2019 0304   CO2 28 12/04/2019 0304   GLUCOSE 118 (H) 12/04/2019 0304   BUN <5 (L) 12/04/2019 0304   BUN 17 07/18/2019 1003   CREATININE 0.53 12/04/2019 0304   CALCIUM 8.4 (L) 12/04/2019 0304    PROT 7.4 12/01/2019 2318   PROT 7.3 07/18/2019 1003   ALBUMIN 4.2 12/01/2019 2318   ALBUMIN 4.6 07/18/2019 1003   AST 25 12/01/2019 2318   ALT 23 12/01/2019 2318   ALKPHOS 67 12/01/2019 2318   BILITOT 1.1 12/01/2019 2318   BILITOT 0.4 07/18/2019 1003   GFRNONAA >60 12/04/2019 0304   GFRAA >60 12/04/2019 0304   Lipase     Component Value Date/Time   LIPASE 34 12/01/2019 2318       Studies/Results: No results found.  Anti-infectives: Anti-infectives (From admission, onward)   None       Assessment/Plan HTN HLD GERD Hypokalemia - It is unclear why she has persistent hypokalemia I will have to defer to the medicine doctor to try to figure out the etiology of that.  I do not believe it is due to GI losses since she is not having diarrhea. - Per [facility] -   Chronic small bowel containing midline hernias Chronic  pSBO - Hx open rectrorectus ventral hernia repair by Dr. Derrell Lolling on 06/15/16 - Hxexploratory laparotomy, lysis of adhesions, enterorrhaphy, incisional hernia repairby Dr. Sheliah Hatch 09/11/2017 - Hx recurrent incarcerated ventral incisional hernia and lysis of adhesionsby Dr. Donell Beers 02/14/2019 - Hx exploratory laparotomy with primary repair of recurrent incisional herniaby Dr. Cliffton Asters on 07/25/2019 - CT scan on admission with chronic small bowel containing midline hernia and psbo. Similar in appearance to CT 6/16.  - No indication for emergency surgery at this time.  - Tolerating FLD. Adv to soft. Okay for d/c from a general surgery standpoint. Follow up with Dr. Sheliah Hatch as outpatient (last visit 10/30/19). She has been following with him as an outpatient and trying to lose weight before pursuing elective repair - I have reached out to [facility] to let them know patient is okay for discharge. We will follow peripherally.   FEN - Soft, IVF, replace electrolytes VTE -SCDs, heparin subq ID -None    LOS: 3 days    Jacinto Halim , [facility]  Surgery 12/05/2019, 8:33 AM Please see Amion for pager number during day hours 7:00am-4:30pm

## 2019-12-05 NOTE — Discharge Summary (Signed)
Rhonda [name] BMW:[id number] DOB: 1950-09-05 DOA: 12/01/2019  PCP: Garey Ham, PA-C  Admit date: 12/01/2019 Discharge date: 12/05/2019  Admitted From: home Disposition:  home  Recommendations for Outpatient Follow-up:  1. Follow up with PCP in 1 week 2. Please obtain BMP/CBC in one week 3. F/u with Dr. Dr. Sheliah Hatch already scheduled      Discharge Condition:Stable CODE STATUS: Full Diet recommendation: Soft diet    Brief/Interim Summary: Rhonda [name]  is a 69 y.o. female, with history of thyroid nodule, hypertension, hyperlipidemia, GERD, hernia, 3 small bowel obstructions, and more presents to the ER with a chief complaint of abdominal pain, nausea, vomiting.  Patient reports that she started having diarrhea nausea and vomiting  She had diarrhea 5 or 6 times per day .Family reports that due to recurrent SBOs, they have recently followed up with gen surg re: total revision. That appt was 5 weeks ago.   She had a CT scan that revealed chronic small bowel containing midline hernias with chronic obstruction.  General surgery was consulted.   Chronic small bowel obstruction -General surgery following, continue conservative management. Diet was advanced to soft and they recommended for patient to continue on soft diet until sees her doctor. -Nausea vomiting has improved,no NG tube was placed She has had 2 bowel movements yesterday and a small one today.  Bowel movements are becoming more formed. Note:  Dr. Derrell Lolling on 2/14/18She underwent an open rectrorectus ventral hernia repair  Dr. Sheliah Hatch 09/11/2017 - She underwentexploratory laparotomy, lysis of adhesions, enterorrhaphy, incisional hernia repair. Dr. Donell Beers 02/14/2019 - She underwernt repair of recurrent incarcerated ventral incisional hernia and lysis of adhesions.  Dr. Cliffton Asters on 07/25/2019 - She underwent exploratory laparotomy with primary repair of recurrent incisional hernia She is following with Dr.  Sheliah Hatch   Hypokalemia/hypomagnesemia 1. 2/2 GI losses 2. Magnesium level is 1.8 this a.m. and will replace IV again Potassium is 3.2 and will give IV and p.o. prior to discharge She is on potassium at home. Was instructed to see pcp next week to have blood work done Nausea/vomiting/ diarrhea 1. Improving 2. possibly 2/2 SBO 3. History is not consistent with food poisoning 4. Vaccinated for covid 5. No WBC acct 6. Most likely mechanical as in SBO 7. Overall improved, with some liquidy stool that is becoming formed now.  GERD Continue PPI    Discharge Diagnoses:  Active Problems:   SBO (small bowel obstruction) (HCC)   Hypokalemia due to excessive gastrointestinal loss of potassium    Discharge Instructions  Discharge Instructions    Call MD for:  persistant nausea and vomiting   Complete by: As directed    Call MD for:  temperature >100.4   Complete by: As directed    Diet - low sodium heart healthy   Complete by: As directed    Soft diet   Discharge instructions   Complete by: As directed    Need to see primary care by early next week for blood work. Continue soft diet   Increase activity slowly   Complete by: As directed      Allergies as of 12/05/2019      Reactions   Aspirin-acetaminophen-caffeine Nausea And Vomiting   Codeine Diarrhea, Nausea And Vomiting   Stomach cramps   Oxycontin [oxycodone Hcl] Other (See Comments)   Hallucination      Medication List    STOP taking these medications   hydrochlorothiazide 25 MG tablet Commonly known as: HYDRODIURIL   traMADol 50 MG tablet Commonly  known as: ULTRAM     TAKE these medications   albuterol 108 (90 Base) MCG/ACT inhaler Commonly known as: ProAir HFA Inhale 2 puffs into the lungs every 6 (six) hours as needed for wheezing or shortness of breath.   atorvastatin 10 MG tablet Commonly known as: LIPITOR Take 1 tablet by mouth once daily   dorzolamide-timolol 22.3-6.8 MG/ML ophthalmic  solution Commonly known as: COSOPT Place 1 drop into both eyes 2 (two) times daily.   esomeprazole 40 MG capsule Commonly known as: NEXIUM Take 1 capsule (40 mg total) by mouth daily at 12 noon. What changed:   medication strength  how much to take   fluticasone 50 MCG/ACT nasal spray Commonly known as: FLONASE Use 1 spray(s) in each nostril once daily What changed: See the new instructions.   levocetirizine 5 MG tablet Commonly known as: XYZAL TAKE 1 TABLET BY MOUTH ONCE DAILY IN THE EVENING   losartan 100 MG tablet Commonly known as: COZAAR Take 1 tablet by mouth once daily   multivitamin with minerals Tabs tablet Take 1 tablet by mouth daily.   polyethylene glycol 17 g packet Commonly known as: MIRALAX / GLYCOLAX Take 17 g by mouth daily as needed for mild constipation.   potassium chloride 10 MEQ tablet Commonly known as: KLOR-CON Take 1 tablet by mouth once daily   VITAMIN D-3 PO Take 1 capsule by mouth daily.       Follow-up Information    Rodriguez-Southworth, Nettie Elm, PA-C Follow up.   Specialties: Internal Medicine, Emergency Medicine Contact information: [address] [place] [address] [phone]        Kinsinger, De Blanch, MD. Schedule an appointment as soon as possible for a visit in 3 week(s).   Specialty: General Surgery Contact information: [address] [place] [address] [phone]              Allergies  Allergen Reactions  . Aspirin-Acetaminophen-Caffeine Nausea And Vomiting  . Codeine Diarrhea and Nausea And Vomiting    Stomach cramps   . Oxycontin [Oxycodone Hcl] Other (See Comments)    Hallucination     Consultations:  General surgery   Procedures/Studies: DG Abdomen 1 View  Result Date: 12/01/2019 CLINICAL DATA:  Nausea and vomiting EXAM: ABDOMEN - 1 VIEW COMPARISON:  None. FINDINGS: There is a focal mildly dilated air-filled loops of bowel seen within the mid abdomen  measuring to 5 cm. Air seen in nondilated loops of bowel within the mid abdomen. No radio-opaque calculi or other significant radiographic abnormality are seen. IMPRESSION: Nonspecific focally dilated loop of bowel within the mid abdomen which could represent a focal ileus. Electronically Signed   By: Jonna Clark M.D.   On: 12/01/2019 23:48   CT ABDOMEN PELVIS W CONTRAST  Result Date: 12/02/2019 CLINICAL DATA:  Abdominal abscess/infection suspected. Nausea and vomiting with severe epigastric pain for 3 days with watery diarrhea. EXAM: CT ABDOMEN AND PELVIS WITH CONTRAST TECHNIQUE: Multidetector CT imaging of the abdomen and pelvis was performed using the standard protocol following bolus administration of intravenous contrast. CONTRAST:  OMNIPAQUE IOHEXOL 300 MG/ML  SOLN COMPARISON:  10/16/2019 FINDINGS: Lower chest:  No contributory findings. Hepatobiliary: No worrisome liver abnormality. A tiny low-density in the ventral right liver is most likely cyst given discrete appearance for size. Tiny calcified gallstone. No evidence of biliary inflammation or obstruction. Pancreas: Unremarkable. Spleen: Unremarkable. Adrenals/Urinary Tract: Negative adrenals. No hydronephrosis or stone. Unremarkable bladder. Stomach/Bowel: 2 midline incisional hernias containing  loops of small bowel. Continued dilatation of proximal small bowel loops measuring up to 4.5 cm in diameter. Oral contrast was not administered. Prominent distal gastric wall thickness but circumferential and associated with luminal collapse. Small sliding hiatal hernia. Left colonic diverticulosis. Vascular/Lymphatic: No acute vascular abnormality. Area of fat necrosis in low and ventral left lower quadrant. No mass or adenopathy. Reproductive:3 cm calcified fibroid. Other: No ascites or pneumoperitoneum. Musculoskeletal: Lumbar spine degeneration with L5-S1 fusion and solid arthrodesis. IMPRESSION: Chronic small bowel containing midline hernias with  chronic partial obstruction. Electronically Signed   By: Marnee Spring M.D.   On: 12/02/2019 04:36   DG Abd Portable 1V  Result Date: 12/03/2019 CLINICAL DATA:  Small-bowel obstruction. EXAM: PORTABLE ABDOMEN - 1 VIEW COMPARISON:  CT 12/02/2019. Abdomen 12/01/2019. FINDINGS: Persistent small-bowel distention, slightly decreased from prior exam. Colonic gas pattern is normal. No free air. Prior lumbar spine fusion. Thoracolumbar spine scoliosis concave right. Calcified uterine fibroid again noted. Pelvic phleboliths again noted. IMPRESSION: Persistent small-bowel distention, slightly decreased from prior exam. Colonic gas pattern is normal. Findings again consistent with partial small bowel obstruction. No free air. Electronically Signed   By: Maisie Fus  Register   On: 12/03/2019 06:55      Subjective: Feels well. +bM. No n/v  Discharge Exam: Vitals:   12/04/19 2035 12/05/19 0322  BP: (!) 145/66 129/63  Pulse: 66 74  Resp: 18 20  Temp: 97.8 F (36.6 C) 97.9 F (36.6 C)  SpO2: 100% 100%   Vitals:   12/04/19 0547 12/04/19 1303 12/04/19 2035 12/05/19 0322  BP: 138/61 (!) 153/67 (!) 145/66 129/63  Pulse: 63 61 66 74  Resp: 17  18 20   Temp: 98.4 F (36.9 C)  97.8 F (36.6 C) 97.9 F (36.6 C)  TempSrc: Oral  Oral   SpO2: 100% 100% 100% 100%  Weight:    119.5 kg  Height:        General: Pt is alert, awake, not in acute distress Cardiovascular: RRR, S1/S2 +, no rubs, no gallops Respiratory: CTA bilaterally, no wheezing, no rhonchi Abdominal: Soft, NT, ND, bowel sounds + Extremities: no edema, no cyanosis    The results of significant diagnostics from this hospitalization (including imaging, microbiology, ancillary and laboratory) are listed below for reference.     Microbiology: Recent Results (from the past 240 hour(s))  SARS Coronavirus 2 by RT PCR (hospital order, performed in [facility] hospital lab) Nasopharyngeal Nasopharyngeal Swab     Status: None   Collection Time:  12/02/19  4:48 AM   Specimen: Nasopharyngeal Swab  Result Value Ref Range Status   SARS Coronavirus 2 NEGATIVE NEGATIVE Final    Comment: (NOTE) SARS-CoV-2 target nucleic acids are NOT DETECTED.  The SARS-CoV-2 RNA is generally detectable in upper and lower respiratory specimens during the acute phase of infection. The lowest concentration of SARS-CoV-2 viral copies this assay can detect is 250 copies / mL. A negative result does not preclude SARS-CoV-2 infection and should not be used as the sole basis for treatment or other patient management decisions.  A negative result may occur with improper specimen collection / handling, submission of specimen other than nasopharyngeal swab, presence of viral mutation(s) within the areas targeted by this assay, and inadequate number of viral copies (<250 copies / mL). A negative result must be combined with clinical observations, patient history, and epidemiological information.  Fact Sheet for Patients:   BoilerBrush.com.cy  Fact Sheet for Healthcare Providers: https://pope.com/  This test is not yet approved  or  cleared by the [place] and has been authorized for detection and/or diagnosis of SARS-CoV-2 by FDA under an Emergency Use Authorization (EUA).  This EUA will remain in effect (meaning this test can be used) for the duration of the COVID-19 declaration under Section 564(b)(1) of the Act, 21 U.S.C. section 360bbb-3(b)(1), unless the authorization is terminated or revoked sooner.  Performed at [facility], [address], [place], [place] [address]      Labs: BNP (last 3 results) No results for input(s): BNP in the last 8760 hours. Basic Metabolic Panel: Recent Labs  Lab 12/01/19 2318 12/01/19 2318 12/02/19 0835 12/03/19 0111 12/03/19 1001 12/04/19 0304 12/04/19 1454 12/05/19 0514  NA 139  --   --  141  --  140  --   --   K 2.2*   < >  --  2.2* 2.6*  2.8* 3.6 3.2*  CL 95*  --   --  100  --  104  --   --   CO2 32  --   --  31  --  28  --   --   GLUCOSE 134*  --   --  106*  --  118*  --   --   BUN 10  --   --  <5*  --  <5*  --   --   CREATININE 0.71  --   --  0.59  --  0.53  --   --   CALCIUM 9.4  --   --  8.1*  --  8.4*  --   --   MG  --   --  1.6*  --  1.6*  --   --  1.8   < > = values in this interval not displayed.   Liver Function Tests: Recent Labs  Lab 12/01/19 2318  AST 25  ALT 23  ALKPHOS 67  BILITOT 1.1  PROT 7.4  ALBUMIN 4.2   Recent Labs  Lab 12/01/19 2318  LIPASE 34   No results for input(s): AMMONIA in the last 168 hours. CBC: Recent Labs  Lab 12/01/19 2318 12/03/19 0111 12/04/19 0304 12/05/19 0514  WBC 6.3 5.2 5.6 5.0  HGB 12.5 10.6* 10.3* 10.6*  HCT 38.6 32.7* 31.4* 32.5*  MCV 87.7 86.5 86.5 88.3  PLT 337 263 257 274   Cardiac Enzymes: No results for input(s): CKTOTAL, CKMB, CKMBINDEX, TROPONINI in the last 168 hours. BNP: Invalid input(s): POCBNP CBG: No results for input(s): GLUCAP in the last 168 hours. D-Dimer No results for input(s): DDIMER in the last 72 hours. Hgb A1c No results for input(s): HGBA1C in the last 72 hours. Lipid Profile No results for input(s): CHOL, HDL, LDLCALC, TRIG, CHOLHDL, LDLDIRECT in the last 72 hours. Thyroid function studies No results for input(s): TSH, T4TOTAL, T3FREE, THYROIDAB in the last 72 hours.  Invalid input(s): FREET3 Anemia work up No results for input(s): VITAMINB12, FOLATE, FERRITIN, TIBC, IRON, RETICCTPCT in the last 72 hours. Urinalysis    Component Value Date/Time   COLORURINE YELLOW 12/01/2019 2311   APPEARANCEUR CLEAR 12/01/2019 2311   LABSPEC 1.012 12/01/2019 2311   PHURINE 5.0 12/01/2019 2311   GLUCOSEU NEGATIVE 12/01/2019 2311   HGBUR NEGATIVE 12/01/2019 2311   BILIRUBINUR NEGATIVE 12/01/2019 2311   BILIRUBINUR negative 01/10/2019 0956   KETONESUR 20 (A) 12/01/2019 2311   PROTEINUR 100 (A) 12/01/2019 2311   UROBILINOGEN 0.2  01/10/2019 0956   NITRITE NEGATIVE 12/01/2019 2311   LEUKOCYTESUR NEGATIVE 12/01/2019  2311   Sepsis Labs Invalid input(s): PROCALCITONIN,  WBC,  LACTICIDVEN Microbiology Recent Results (from the past 240 hour(s))  SARS Coronavirus 2 by RT PCR (hospital order, performed in [facility] hospital lab) Nasopharyngeal Nasopharyngeal Swab     Status: None   Collection Time: 12/02/19  4:48 AM   Specimen: Nasopharyngeal Swab  Result Value Ref Range Status   SARS Coronavirus 2 NEGATIVE NEGATIVE Final    Comment: (NOTE) SARS-CoV-2 target nucleic acids are NOT DETECTED.  The SARS-CoV-2 RNA is generally detectable in upper and lower respiratory specimens during the acute phase of infection. The lowest concentration of SARS-CoV-2 viral copies this assay can detect is 250 copies / mL. A negative result does not preclude SARS-CoV-2 infection and should not be used as the sole basis for treatment or other patient management decisions.  A negative result may occur with improper specimen collection / handling, submission of specimen other than nasopharyngeal swab, presence of viral mutation(s) within the areas targeted by this assay, and inadequate number of viral copies (<250 copies / mL). A negative result must be combined with clinical observations, patient history, and epidemiological information.  Fact Sheet for Patients:   BoilerBrush.com.cy  Fact Sheet for Healthcare Providers: https://pope.com/  This test is not yet approved or  cleared by the [place] FDA and has been authorized for detection and/or diagnosis of SARS-CoV-2 by FDA under an Emergency Use Authorization (EUA).  This EUA will remain in effect (meaning this test can be used) for the duration of the COVID-19 declaration under Section 564(b)(1) of the Act, 21 U.S.C. section 360bbb-3(b)(1), unless the authorization is terminated or revoked sooner.  Performed at [facility], [address], [place], [place] [address]      Time coordinating discharge: Over 30 minutes  SIGNED:   Lynn Ito, MD  [facility] 12/05/2019, 12:19 PM Pager   If 7PM-7AM, please contact night-coverage [email or web address] Password [email or web address]

## 2019-12-09 ENCOUNTER — Telehealth: Payer: Self-pay | Admitting: Skilled Nursing Facility1

## 2019-12-09 NOTE — Telephone Encounter (Signed)
Pt states she was readmitted for partial bowel blockage. Pt states last night was nauseated.  Eggs + cheese crackers Tomato soup + milk peaches (cereal bowl full) Tomato sandwich + chicken leg  Advice: Completely avoid fresh vegeatbles including tomato  When not feeling well move back down to a liquid diet for one day or until symptoms are better if you feel you need it for multiple days be sure to call your doctor Avoid broccoli, cabbage, brussels sprouts, and fresh spinach completelt. If eating mixed greens cook until tender and soft and only eat 1/4 cup Limit canned peaches to 1/4 cup Be sure to hit at least 64 ounces of water per day

## 2019-12-10 ENCOUNTER — Other Ambulatory Visit: Payer: Self-pay

## 2019-12-10 ENCOUNTER — Ambulatory Visit (INDEPENDENT_AMBULATORY_CARE_PROVIDER_SITE_OTHER): Payer: Medicare Other | Admitting: Nurse Practitioner

## 2019-12-10 ENCOUNTER — Encounter: Payer: Self-pay | Admitting: Nurse Practitioner

## 2019-12-10 VITALS — BP 140/86 | HR 75 | Temp 98.0°F | Ht 63.0 in | Wt 249.2 lb

## 2019-12-10 DIAGNOSIS — E876 Hypokalemia: Secondary | ICD-10-CM | POA: Diagnosis not present

## 2019-12-10 DIAGNOSIS — K56609 Unspecified intestinal obstruction, unspecified as to partial versus complete obstruction: Secondary | ICD-10-CM | POA: Diagnosis not present

## 2019-12-10 NOTE — Progress Notes (Signed)
This visit occurred during the SARS-CoV-2 public health emergency.  Safety protocols were in place, including screening questions prior to the visit, additional usage of staff PPE, and extensive cleaning of exam room while observing appropriate contact time as indicated for disinfecting solutions.  Subjective:     Patient ID: Rhonda [name] , female    DOB: 10-05-1950 , 69 y.o.   MRN: [id number]   Chief Complaint  Patient presents with  . Hospitalization Follow-up    patient stated shewas not feeling well 2 nights ago she was real nauseated and having loss BM    HPI  She is here for hospital follow up for SBO - (chronic) she is now on a soft diet until she sees Dr. Edison Pace. Her potassium dropped multiple times during her hospitalization.  She has had problems before in the past.  Her potassium was down to 2.6 since being discharged it was 3.2. she is taking potassium supplement 10 meq.  She has had several loose bowel movements. Some is semi-formed.  She had a tomato and she was up all night.  She is seeing a nutritionist after her follow up in April - she has been in communication with her.  Wt Readings from Last 3 Encounters: 12/10/19 : 249 lb 3.2 oz (113 kg) 12/05/19 : 263 lb 7.2 oz (119.5 kg) 07/25/19 : 258 lb (117 kg)  Her daughter would like for her to go to [place].     Past Medical History:  Diagnosis Date  . Anemia    hx none since menopause  . Anginal pain ([place])    due to chest wall from cough  . Arthritis    "maybe in my legs" (06/15/2016)  . Asthma 2018   none since  . Complication of anesthesia    woke up during colonoscopy 03-31-16 Dr. Thornton Park  . GERD (gastroesophageal reflux disease)   . Heart murmur 2018  . History of hiatal hernia   . Hyperlipemia   . Hypertension   . Pre-diabetes 2019  . Sinus congestion    "chronic" (06/15/2016)  . Sinus headache    "a few/month" (06/15/2016)  . Thyroid nodule 2010   "no problems w/them since; on a pill for a  while" (06/15/2016)     Family History  Problem Relation Age of Onset  . COPD Father   . Breast cancer Neg Hx      Current Outpatient Medications:  .  albuterol (PROAIR HFA) 108 (90 Base) MCG/ACT inhaler, Inhale 2 puffs into the lungs every 6 (six) hours as needed for wheezing or shortness of breath., Disp: 1 Inhaler, Rfl: 5 .  dorzolamide-timolol (COSOPT) 22.3-6.8 MG/ML ophthalmic solution, Place 1 drop into both eyes 2 (two) times daily. , Disp: , Rfl:  .  esomeprazole (NEXIUM) 40 MG capsule, Take 1 capsule (40 mg total) by mouth daily at 12 noon., Disp: 30 capsule, Rfl: 0 .  fluticasone (FLONASE) 50 MCG/ACT nasal spray, Use 1 spray(s) in each nostril once daily (Patient taking differently: Place 1 spray into both nostrils daily. ), Disp: 16 g, Rfl: 1 .  levocetirizine (XYZAL) 5 MG tablet, TAKE 1 TABLET BY MOUTH ONCE DAILY IN THE EVENING (Patient taking differently: Take 5 mg by mouth every evening. ), Disp: 30 tablet, Rfl: 0 .  Multiple Vitamin (MULTIVITAMIN WITH MINERALS) TABS tablet, Take 1 tablet by mouth daily., Disp: , Rfl:  .  acetaminophen (TYLENOL) 650 MG CR tablet, Take 1,300 mg by mouth at bedtime as needed for pain. ,  Disp: , Rfl:  .  atorvastatin (LIPITOR) 10 MG tablet, Take 1 tablet by mouth once daily (Patient taking differently: Take 10 mg by mouth daily. ), Disp: 90 tablet, Rfl: 0 .  Cholecalciferol (DIALYVITE VITAMIN D 5000) 125 MCG (5000 UT) capsule, Take 5,000 Units by mouth daily., Disp: , Rfl:  .  hydrochlorothiazide (HYDRODIURIL) 25 MG tablet, Take 25 mg by mouth daily., Disp: , Rfl:  .  HYDROcodone-acetaminophen (NORCO/VICODIN) 5-325 MG tablet, Take 1 tablet by mouth every 6 (six) hours as needed for moderate pain., Disp: 20 tablet, Rfl: 0 .  ibuprofen (ADVIL) 600 MG tablet, Take 1 tablet (600 mg total) by mouth every 6 (six) hours as needed., Disp: 30 tablet, Rfl: 0 .  losartan (COZAAR) 100 MG tablet, Take 1 tablet by mouth once daily, Disp: 90 tablet, Rfl: 0 .   ondansetron (ZOFRAN-ODT) 4 MG disintegrating tablet, Take 4 mg by mouth every 6 (six) hours as needed for nausea or vomiting., Disp: , Rfl:  .  polyethylene glycol (MIRALAX / GLYCOLAX) 17 g packet, Take 17 g by mouth daily as needed for mild constipation., Disp: 14 each, Rfl: 0 .  potassium chloride (KLOR-CON) 10 MEQ tablet, Take 1 tablet by mouth once daily (Patient taking differently: Take 10 mEq by mouth daily. ), Disp: 30 tablet, Rfl: 0 .  Probiotic CAPS, Take 1 capsule by mouth daily., Disp: , Rfl:    Allergies  Allergen Reactions  . Aspirin-Acetaminophen-Caffeine Nausea And Vomiting  . Codeine Diarrhea and Nausea And Vomiting    Stomach cramps   . Oxycontin [Oxycodone Hcl] Other (See Comments)    Hallucination      Review of Systems  Constitutional: Negative.   Respiratory: Negative.   Cardiovascular: Negative.  Negative for chest pain, palpitations and leg swelling.  Gastrointestinal: Negative for abdominal distention, constipation, diarrhea, nausea and vomiting.  Neurological: Negative for dizziness and headaches.  Psychiatric/Behavioral: Negative.      Today's Vitals   12/10/19 1144  BP: 140/86  Pulse: 75  Temp: 98 F (36.7 C)  TempSrc: Oral  Weight: 249 lb 3.2 oz (113 kg)  Height: _0  (1.6 m)  PainSc: 0-No pain   Body mass index is 44.14 kg/m.   Objective:  Physical Exam Vitals reviewed.  Constitutional:      General: She is not in acute distress.    Appearance: Normal appearance.  Cardiovascular:     Rate and Rhythm: Normal rate and regular rhythm.     Pulses: Normal pulses.     Heart sounds: Normal heart sounds. No murmur heard.   Pulmonary:     Effort: Pulmonary effort is normal. No respiratory distress.     Breath sounds: Normal breath sounds.  Abdominal:     General: Bowel sounds are normal. There is no distension (rounded).     Palpations: Abdomen is soft.     Tenderness: There is no abdominal tenderness.  Neurological:     General: No focal  deficit present.     Mental Status: She is alert and oriented to person, place, and time.     Cranial Nerves: No cranial nerve deficit.  Psychiatric:        Mood and Affect: Mood normal.        Behavior: Behavior normal.        Thought Content: Thought content normal.        Judgment: Judgment normal.         Assessment And Plan:     1. SBO (  small bowel obstruction) ([place])  TCM Performed. A member of the clinical team spoke with the patient upon dischare. Discharge summary was reviewed in full detail during the visit. Meds reconciled and compared to discharge meds. Medication list is updated and reviewed with the patient.  Greater than 50% face to face time was spent in counseling an coordination of care.  All questions were answered to the satisfaction of the patient.    She has chronic small bowel containing midline hernias with chronic obstruction. since being admitted to the hospital from 8/1-8/5. Conservative treatment. She is to follow up with Dr. Reece Agar - CBC no Diff  2. Hypokalemia due to excessive gastrointestinal loss of potassium  Will recheck levels today  Encouraged to eat high potassium foods.  - BMP8+EGFR - CBC no Diff     Patient was given opportunity to ask questions. Patient verbalized understanding of the plan and was able to repeat key elements of the plan. All questions were answered to their satisfaction.   Teola Bradley, FNP, have reviewed all documentation for this visit. The documentation on 01/26/20 for the exam, diagnosis, procedures, and orders are all accurate and complete.  THE PATIENT IS ENCOURAGED TO PRACTICE SOCIAL DISTANCING DUE TO THE COVID-19 PANDEMIC.

## 2019-12-11 LAB — BMP8+EGFR
BUN/Creatinine Ratio: 13 (ref 12–28)
BUN: 10 mg/dL (ref 8–27)
CO2: 29 mmol/L (ref 20–29)
Calcium: 9.7 mg/dL (ref 8.7–10.3)
Chloride: 102 mmol/L (ref 96–106)
Creatinine, Ser: 0.79 mg/dL (ref 0.57–1.00)
GFR calc Af Amer: 88 mL/min/{1.73_m2} (ref >59)
GFR calc non Af Amer: 76 mL/min/{1.73_m2} (ref >59)
Glucose: 90 mg/dL (ref 65–99)
Potassium: 4.1 mmol/L (ref 3.5–5.2)
Sodium: 142 mmol/L (ref 134–144)

## 2019-12-11 LAB — CBC
Hematocrit: 36.3 % (ref 34.0–46.6)
Hemoglobin: 11.9 g/dL (ref 11.1–15.9)
MCH: 28.4 pg (ref 26.6–33.0)
MCHC: 32.8 g/dL (ref 31.5–35.7)
MCV: 87 fL (ref 79–97)
Platelets: 338 x10E3/uL (ref 150–450)
RBC: 4.19 x10E6/uL (ref 3.77–5.28)
RDW: 13.4 % (ref 11.7–15.4)
WBC: 4.9 x10E3/uL (ref 3.4–10.8)

## 2019-12-12 ENCOUNTER — Other Ambulatory Visit: Payer: Self-pay | Admitting: Internal Medicine

## 2019-12-17 ENCOUNTER — Ambulatory Visit: Payer: Medicare Other | Admitting: Skilled Nursing Facility1

## 2019-12-23 ENCOUNTER — Other Ambulatory Visit: Payer: Self-pay | Admitting: Nurse Practitioner

## 2019-12-27 ENCOUNTER — Ambulatory Visit: Payer: Self-pay | Admitting: General Surgery

## 2019-12-27 NOTE — Patient Instructions (Addendum)
DUE TO COVID-19 ONLY ONE VISITOR IS ALLOWED TO COME WITH YOU AND STAY IN THE WAITING ROOM ONLY DURING PRE OP AND PROCEDURE DAY OF SURGERY. THE 1 VISITOR  MAY VISIT WITH YOU AFTER SURGERY IN YOUR PRIVATE ROOM DURING VISITING HOURS ONLY!  YOU NEED TO HAVE A COVID 19 TEST ON_9/3______ @_10 :00______, THIS TEST MUST BE DONE BEFORE SURGERY,  COVID TESTING SITE [address] [place] , IT IS ON THE RIGHT GOING OUT [address] APPROXIMATELY  2 MINUTES PAST [employer] ON THE RIGHT. ONCE YOUR COVID TEST IS COMPLETED,  PLEASE BEGIN THE QUARANTINE INSTRUCTIONS AS OUTLINED IN YOUR HANDOUT.                [id number] Rhonda [name]   Your procedure is scheduled on: 01/07/20   Report to [facility] Main  Entrance   Report to Short Stay at 5:30 AM     Call this number if you have problems the morning of surgery [phone]    Remember: Do not eat food After Midnight  You may have clear liquid until 4:30 AM  . BRUSH YOUR TEETH MORNING OF SURGERY AND RINSE YOUR MOUTH OUT, NO CHEWING GUM CANDY OR MINTS.     Take these medicines the morning of surgery with A SIP OF WATER: Nexium, Flonase, use you eye drops,             use your inhaler and bring it with you to the hospital                                 You may not have any metal on your body including hair pins and              piercings  Do not wear jewelry, make-up, lotions, powders or perfumes, deodorant             Do not wear nail polish on your fingernails.  Do not shave  48 hours prior to surgery.     Do not bring valuables to the hospital. [place] IS NOT             RESPONSIBLE   FOR VALUABLES.  Contacts, dentures or bridgework may not be worn into surgery.      Patients discharged the day of surgery will not be allowed to drive home.   IF YOU ARE HAVING SURGERY AND GOING HOME THE SAME DAY, YOU MUST HAVE AN ADULT TO DRIVE YOU HOME AND BE WITH YOU FOR 24 HOURS.   YOU MAY GO HOME BY TAXI OR UBER OR  ORTHERWISE, BUT AN ADULT MUST ACCOMPANY YOU HOME AND STAY WITH YOU FOR 24 HOURS.  Name and phone number of your driver:  Special Instructions: N/A              Please read over the following fact sheets you were given: _____________________________________________________________________             [facility] - Preparing for Surgery Before surgery, you can play an important role.   Because skin is not sterile, your skin needs to be as free of germs as possible.   You can reduce the number of germs on your skin by washing with CHG (chlorahexidine gluconate) soap before surgery.   CHG is an antiseptic cleaner which kills germs and bonds with the skin to continue killing germs even after washing. Please DO NOT use if  you have an allergy to CHG or antibacterial soaps.   If your skin becomes reddened/irritated stop using the CHG and inform your nurse when you arrive at Short Stay. Do not shave (including legs and underarms) for at least 48 hours prior to the first CHG shower.  Please follow these instructions carefully:  1.  Shower with CHG Soap the night before surgery and the  morning of Surgery.  2.  If you choose to wash your hair, wash your hair first as usual with your  normal  shampoo.  3.  After you shampoo, rinse your hair and body thoroughly to remove the  shampoo.                                        4.  Use CHG as you would any other liquid soap.  You can apply chg directly  to the skin and wash                       Gently with a scrungie or clean washcloth.  5.  Apply the CHG Soap to your body ONLY FROM THE NECK DOWN.   Do not use on face/ open                           Wound or open sores. Avoid contact with eyes, ears mouth and genitals (private parts).                       Wash face,  Genitals (private parts) with your normal soap.             6.  Wash thoroughly, paying special attention to the area where your surgery  will be performed.  7.  Thoroughly rinse your body  with warm water from the neck down.  8.  DO NOT shower/wash with your normal soap after using and rinsing off  the CHG Soap.             9.  Pat yourself dry with a clean towel.            10.  Wear clean pajamas.            11.  Place clean sheets on your bed the night of your first shower and do not  sleep with pets. Day of Surgery : Do not apply any lotions/deodorants the morning of surgery.  Please wear clean clothes to the hospital/surgery center.  FAILURE TO FOLLOW THESE INSTRUCTIONS MAY RESULT IN THE CANCELLATION OF YOUR SURGERY PATIENT SIGNATURE_________________________________  NURSE SIGNATURE__________________________________  ________________________________________________________________________

## 2019-12-31 ENCOUNTER — Other Ambulatory Visit: Payer: Self-pay

## 2019-12-31 ENCOUNTER — Encounter (HOSPITAL_COMMUNITY): Payer: Self-pay

## 2019-12-31 ENCOUNTER — Encounter (HOSPITAL_COMMUNITY)
Admission: RE | Admit: 2019-12-31 | Discharge: 2019-12-31 | Disposition: A | Discharge status: Home / Self Care | Payer: Medicare Other | Source: Ambulatory Visit | Attending: General Surgery | Admitting: General Surgery

## 2019-12-31 DIAGNOSIS — Z01812 Encounter for preprocedural laboratory examination: Secondary | ICD-10-CM | POA: Diagnosis not present

## 2019-12-31 HISTORY — DX: Angina pectoris, unspecified: I20.9

## 2019-12-31 HISTORY — DX: Personal history of other diseases of the digestive system: Z87.19

## 2019-12-31 LAB — COMPREHENSIVE METABOLIC PANEL
ALT: 14 U/L (ref 0–44)
AST: 16 U/L (ref 15–41)
Albumin: 4.3 g/dL (ref 3.5–5.0)
Alkaline Phosphatase: 62 U/L (ref 38–126)
Anion gap: 10 (ref 5–15)
BUN: 21 mg/dL (ref 8–23)
CO2: 29 mmol/L (ref 22–32)
Calcium: 9.5 mg/dL (ref 8.9–10.3)
Chloride: 100 mmol/L (ref 98–111)
Creatinine, Ser: 1.02 mg/dL — ABNORMAL HIGH (ref 0.44–1.00)
GFR calc Af Amer: >60 mL/min (ref >60)
GFR calc non Af Amer: 56 mL/min — ABNORMAL LOW (ref >60)
Glucose, Bld: 111 mg/dL — ABNORMAL HIGH (ref 70–99)
Potassium: 3.7 mmol/L (ref 3.5–5.1)
Sodium: 139 mmol/L (ref 135–145)
Total Bilirubin: 0.6 mg/dL (ref 0.3–1.2)
Total Protein: 7.9 g/dL (ref 6.5–8.1)

## 2019-12-31 LAB — CBC WITH DIFFERENTIAL/PLATELET
Abs Immature Granulocytes: 0.01 10*3/uL (ref 0.00–0.07)
Basophils Absolute: 0 10*3/uL (ref 0.0–0.1)
Basophils Relative: 1 %
Eosinophils Absolute: 0.1 10*3/uL (ref 0.0–0.5)
Eosinophils Relative: 2 %
HCT: 39.3 % (ref 36.0–46.0)
Hemoglobin: 13.1 g/dL (ref 12.0–15.0)
Immature Granulocytes: 0 %
Lymphocytes Relative: 37 %
Lymphs Abs: 1.9 10*3/uL (ref 0.7–4.0)
MCH: 28.7 pg (ref 26.0–34.0)
MCHC: 33.3 g/dL (ref 30.0–36.0)
MCV: 86.2 fL (ref 80.0–100.0)
Monocytes Absolute: 0.3 10*3/uL (ref 0.1–1.0)
Monocytes Relative: 7 %
Neutro Abs: 2.7 10*3/uL (ref 1.7–7.7)
Neutrophils Relative %: 53 %
Platelets: 328 10*3/uL (ref 150–400)
RBC: 4.56 MIL/uL (ref 3.87–5.11)
RDW: 14.2 % (ref 11.5–15.5)
WBC: 5.1 10*3/uL (ref 4.0–10.5)
nRBC: 0 % (ref 0.0–0.2)

## 2019-12-31 LAB — HEMOGLOBIN A1C
Hgb A1c MFr Bld: 5.7 % — ABNORMAL HIGH (ref 4.8–5.6)
Mean Plasma Glucose: 116.89 mg/dL

## 2019-12-31 NOTE — Progress Notes (Signed)
COVID Vaccine Completed:Yes Date COVID Vaccine completed:3.5.21 COVID vaccine manufacturer: Pfizer     PCP - J. Moore FNP Cardiologist - none  Chest x-ray - no EKG - 12/02/19 Stress Test -  ECHO - De. Ramerez -2018 Cardiac Cath - no  Sleep Study - no CPAP -   Fasting Blood Sugar - NA- pre diabetic Checks Blood Sugar _____ times a day  Blood Thinner Instructions:NA Aspirin Instructions: Last Dose:  Anesthesia review:   Patient denies shortness of breath, fever, cough and chest pain at PAT appointment Yes   Patient verbalized understanding of instructions that were given to them at the PAT appointment. Patient was also instructed that they will need to review over the PAT instructions again at home before surgery. Yes  Heart murmur noted ECHO done. Pt is able to cilnb 1 flight of stairs but has no SOB with doing house work or with ADLs.

## 2020-01-03 ENCOUNTER — Other Ambulatory Visit (HOSPITAL_COMMUNITY)
Admission: RE | Admit: 2020-01-03 | Discharge: 2020-01-03 | Disposition: A | Discharge status: Home / Self Care | Payer: Medicare Other | Source: Ambulatory Visit | Attending: General Surgery | Admitting: General Surgery

## 2020-01-03 DIAGNOSIS — Z20822 Contact with and (suspected) exposure to covid-19: Secondary | ICD-10-CM | POA: Insufficient documentation

## 2020-01-03 DIAGNOSIS — Z01812 Encounter for preprocedural laboratory examination: Secondary | ICD-10-CM | POA: Diagnosis present

## 2020-01-03 LAB — SARS CORONAVIRUS 2 (TAT 6-24 HRS): SARS Coronavirus 2: NEGATIVE

## 2020-01-06 MED ORDER — BUPIVACAINE LIPOSOME 1.3 % IJ SUSP
20.0000 mL | INTRAMUSCULAR | Status: DC
  Filled 2020-01-06: qty 20

## 2020-01-06 NOTE — Anesthesia Preprocedure Evaluation (Addendum)
Anesthesia Evaluation  Patient identified by MRN, date of birth, ID band Patient awake    Reviewed: Allergy & Precautions, NPO status , Patient's Chart, lab work & pertinent test results  Airway Mallampati: III  TM Distance: >3 FB Neck ROM: Full    Dental no notable dental hx.    Pulmonary asthma , PE   Pulmonary exam normal breath sounds clear to auscultation       Cardiovascular hypertension, Pt. on medications Normal cardiovascular exam Rhythm:Regular Rate:Normal  ECG: SR, rate 70  ECHO: Left ventricle: The cavity size was normal. Wall thickness was normal. Systolic function was normal. The estimated ejection fraction was in the range of 60% to 65%. Wall motion was normal; there were no regional wall motion abnormalities. Doppler parameters are consistent with abnormal left ventricular relaxation (grade 1 diastolic dysfunction).  Aortic valve: There was no stenosis.  Mitral valve: There was no significant regurgitation.  Right ventricle: The cavity size was normal. Systolic function was normal.  Tricuspid valve: Peak RV-RA gradient (S): 34 mm Hg.  Systemic veins: IVC was not visualized.  Impressions:  Normal LV size with EF 60-65%. Normal RV size and systolic function. No significant valvular abnormalities. Mild pulmonary hypertension.    Neuro/Psych  Headaches, negative psych ROS   GI/Hepatic Neg liver ROS, hiatal hernia, GERD  Medicated and Controlled,  Endo/Other  Morbid obesity  Renal/GU negative Renal ROS     Musculoskeletal  (+) Arthritis ,   Abdominal (+) + obese,   Peds  Hematology HLD   Anesthesia Other Findings INCISIONAL HERNIA, PARTIAL SMALL BOWEL OBSTRUCTION  Reproductive/Obstetrics                          Anesthesia Physical Anesthesia Plan  ASA: III  Anesthesia Plan: General   Post-op Pain Management:    Induction: Intravenous  PONV Risk Score and Plan: 4 or  greater and Ondansetron, Dexamethasone, Propofol infusion and Treatment may vary due to age or medical condition  Airway Management Planned: Oral ETT  Additional Equipment:   Intra-op Plan:   Post-operative Plan: Extubation in OR  Informed Consent: I have reviewed the patients History and Physical, chart, labs and discussed the procedure including the risks, benefits and alternatives for the proposed anesthesia with the patient or authorized representative who has indicated his/her understanding and acceptance.     Dental advisory given  Plan Discussed with: CRNA  Anesthesia Plan Comments:        Anesthesia Quick Evaluation

## 2020-01-07 ENCOUNTER — Encounter (HOSPITAL_COMMUNITY): Payer: Self-pay | Admitting: General Surgery

## 2020-01-07 ENCOUNTER — Inpatient Hospital Stay (HOSPITAL_COMMUNITY)
Admission: RE | Admit: 2020-01-07 | Discharge: 2020-01-14 | DRG: 330 | Disposition: A | Discharge status: Home / Self Care | Payer: Medicare Other | Source: Ambulatory Visit | Attending: General Surgery | Admitting: General Surgery

## 2020-01-07 ENCOUNTER — Inpatient Hospital Stay (HOSPITAL_COMMUNITY): Payer: Medicare Other | Admitting: Physician Assistant

## 2020-01-07 ENCOUNTER — Other Ambulatory Visit: Payer: Self-pay

## 2020-01-07 ENCOUNTER — Inpatient Hospital Stay (HOSPITAL_COMMUNITY): Payer: Medicare Other | Admitting: Anesthesiology

## 2020-01-07 ENCOUNTER — Encounter (HOSPITAL_COMMUNITY)
Admission: RE | Disposition: A | Discharge status: Home / Self Care | Payer: Self-pay | Source: Ambulatory Visit | Attending: General Surgery

## 2020-01-07 DIAGNOSIS — K567 Ileus, unspecified: Secondary | ICD-10-CM | POA: Diagnosis not present

## 2020-01-07 DIAGNOSIS — I10 Essential (primary) hypertension: Secondary | ICD-10-CM | POA: Diagnosis present

## 2020-01-07 DIAGNOSIS — E785 Hyperlipidemia, unspecified: Secondary | ICD-10-CM | POA: Diagnosis present

## 2020-01-07 DIAGNOSIS — Z86711 Personal history of pulmonary embolism: Secondary | ICD-10-CM | POA: Diagnosis not present

## 2020-01-07 DIAGNOSIS — Z886 Allergy status to analgesic agent status: Secondary | ICD-10-CM

## 2020-01-07 DIAGNOSIS — Y838 Other surgical procedures as the cause of abnormal reaction of the patient, or of later complication, without mention of misadventure at the time of the procedure: Secondary | ICD-10-CM | POA: Diagnosis not present

## 2020-01-07 DIAGNOSIS — Z825 Family history of asthma and other chronic lower respiratory diseases: Secondary | ICD-10-CM | POA: Diagnosis not present

## 2020-01-07 DIAGNOSIS — Y92234 Operating room of hospital as the place of occurrence of the external cause: Secondary | ICD-10-CM | POA: Diagnosis not present

## 2020-01-07 DIAGNOSIS — K660 Peritoneal adhesions (postprocedural) (postinfection): Secondary | ICD-10-CM | POA: Diagnosis present

## 2020-01-07 DIAGNOSIS — Z6841 Body Mass Index (BMI) 40.0 and over, adult: Secondary | ICD-10-CM

## 2020-01-07 DIAGNOSIS — J45909 Unspecified asthma, uncomplicated: Secondary | ICD-10-CM | POA: Diagnosis present

## 2020-01-07 DIAGNOSIS — S36439A Laceration of unspecified part of small intestine, initial encounter: Secondary | ICD-10-CM | POA: Diagnosis not present

## 2020-01-07 DIAGNOSIS — Z79899 Other long term (current) drug therapy: Secondary | ICD-10-CM

## 2020-01-07 DIAGNOSIS — Z885 Allergy status to narcotic agent status: Secondary | ICD-10-CM | POA: Diagnosis not present

## 2020-01-07 DIAGNOSIS — K219 Gastro-esophageal reflux disease without esophagitis: Secondary | ICD-10-CM | POA: Diagnosis present

## 2020-01-07 DIAGNOSIS — Z5331 Laparoscopic surgical procedure converted to open procedure: Secondary | ICD-10-CM

## 2020-01-07 DIAGNOSIS — K432 Incisional hernia without obstruction or gangrene: Secondary | ICD-10-CM | POA: Diagnosis present

## 2020-01-07 HISTORY — PX: INCISIONAL HERNIA REPAIR: SHX193

## 2020-01-07 LAB — CBC
HCT: 38.6 % (ref 36.0–46.0)
Hemoglobin: 12.9 g/dL (ref 12.0–15.0)
MCH: 29.1 pg (ref 26.0–34.0)
MCHC: 33.4 g/dL (ref 30.0–36.0)
MCV: 86.9 fL (ref 80.0–100.0)
Platelets: 259 10*3/uL (ref 150–400)
RBC: 4.44 MIL/uL (ref 3.87–5.11)
RDW: 14 % (ref 11.5–15.5)
WBC: 15 10*3/uL — ABNORMAL HIGH (ref 4.0–10.5)
nRBC: 0 % (ref 0.0–0.2)

## 2020-01-07 LAB — CREATININE, SERUM
Creatinine, Ser: 1.08 mg/dL — ABNORMAL HIGH (ref 0.44–1.00)
GFR calc Af Amer: >60 mL/min (ref >60)
GFR calc non Af Amer: 52 mL/min — ABNORMAL LOW (ref >60)

## 2020-01-07 SURGERY — REPAIR, HERNIA, INCISIONAL, LAPAROSCOPIC
Anesthesia: General | Site: Abdomen

## 2020-01-07 MED ORDER — ALBUTEROL SULFATE (2.5 MG/3ML) 0.083% IN NEBU: 3.0000 mL | INHALATION_SOLUTION | Freq: Four times a day (QID) | RESPIRATORY_TRACT | Status: DC | PRN

## 2020-01-07 MED ORDER — FENTANYL CITRATE (PF) 100 MCG/2ML IJ SOLN
INTRAMUSCULAR | Status: AC
  Filled 2020-01-07: qty 2

## 2020-01-07 MED ORDER — FENTANYL CITRATE (PF) 100 MCG/2ML IJ SOLN
INTRAMUSCULAR | Status: DC | PRN
Start: 2020-01-07 — End: 2020-01-07
  Administered 2020-01-07 (×4): 50 ug via INTRAVENOUS

## 2020-01-07 MED ORDER — ENOXAPARIN SODIUM 40 MG/0.4ML ~~LOC~~ SOLN
40.0000 mg | SUBCUTANEOUS | Status: DC
  Administered 2020-01-08 – 2020-01-14 (×7): 40 mg via SUBCUTANEOUS
  Filled 2020-01-07 (×7): qty 0.4

## 2020-01-07 MED ORDER — GABAPENTIN 300 MG PO CAPS
300.0000 mg | ORAL_CAPSULE | Freq: Two times a day (BID) | ORAL | Status: DC
  Administered 2020-01-07 – 2020-01-14 (×13): 300 mg via ORAL
  Filled 2020-01-07 (×14): qty 1

## 2020-01-07 MED ORDER — KETOROLAC TROMETHAMINE 15 MG/ML IJ SOLN
15.0000 mg | Freq: Once | INTRAMUSCULAR | Status: AC | PRN
  Administered 2020-01-07: 15 mg via INTRAVENOUS

## 2020-01-07 MED ORDER — LOSARTAN POTASSIUM 50 MG PO TABS
100.0000 mg | ORAL_TABLET | Freq: Every day | ORAL | Status: DC
  Administered 2020-01-07 – 2020-01-14 (×8): 100 mg via ORAL
  Filled 2020-01-07 (×8): qty 2

## 2020-01-07 MED ORDER — 0.9 % SODIUM CHLORIDE (POUR BTL) OPTIME
TOPICAL | Status: DC | PRN
  Administered 2020-01-07: 2000 mL

## 2020-01-07 MED ORDER — PANTOPRAZOLE SODIUM 40 MG PO TBEC
40.0000 mg | DELAYED_RELEASE_TABLET | Freq: Every day | ORAL | Status: DC
  Administered 2020-01-08 – 2020-01-14 (×7): 40 mg via ORAL
  Filled 2020-01-07 (×7): qty 1

## 2020-01-07 MED ORDER — TRAMADOL HCL 50 MG PO TABS
50.0000 mg | ORAL_TABLET | Freq: Four times a day (QID) | ORAL | Status: DC | PRN
  Administered 2020-01-13: 50 mg via ORAL
  Filled 2020-01-07 (×2): qty 1

## 2020-01-07 MED ORDER — BUPIVACAINE LIPOSOME 1.3 % IJ SUSP
INTRAMUSCULAR | Status: DC | PRN
  Administered 2020-01-07: 20 mL

## 2020-01-07 MED ORDER — ONDANSETRON HCL 4 MG/2ML IJ SOLN
INTRAMUSCULAR | Status: AC
  Filled 2020-01-07: qty 2

## 2020-01-07 MED ORDER — DORZOLAMIDE HCL-TIMOLOL MAL 2-0.5 % OP SOLN
1.0000 [drp] | Freq: Two times a day (BID) | OPHTHALMIC | Status: DC
  Administered 2020-01-07 – 2020-01-14 (×14): 1 [drp] via OPHTHALMIC
  Filled 2020-01-07: qty 10

## 2020-01-07 MED ORDER — FENTANYL CITRATE (PF) 100 MCG/2ML IJ SOLN
25.0000 ug | INTRAMUSCULAR | Status: DC | PRN
  Administered 2020-01-07: 50 ug via INTRAVENOUS

## 2020-01-07 MED ORDER — DIPHENHYDRAMINE HCL 12.5 MG/5ML PO ELIX: 12.5000 mg | ORAL_SOLUTION | Freq: Four times a day (QID) | ORAL | Status: DC | PRN

## 2020-01-07 MED ORDER — MIDAZOLAM HCL 5 MG/5ML IJ SOLN
INTRAMUSCULAR | Status: DC | PRN
  Administered 2020-01-07: 2 mg via INTRAVENOUS

## 2020-01-07 MED ORDER — LIDOCAINE 2% (20 MG/ML) 5 ML SYRINGE
INTRAMUSCULAR | Status: AC
  Filled 2020-01-07: qty 5

## 2020-01-07 MED ORDER — ROCURONIUM BROMIDE 10 MG/ML (PF) SYRINGE
PREFILLED_SYRINGE | INTRAVENOUS | Status: DC | PRN
  Administered 2020-01-07: 60 mg via INTRAVENOUS
  Administered 2020-01-07: 10 mg via INTRAVENOUS

## 2020-01-07 MED ORDER — LIDOCAINE IN D5W 4-5 MG/ML-% IV SOLN
INTRAVENOUS | Status: DC | PRN
  Administered 2020-01-07: 8 ug/kg/min via INTRAVENOUS

## 2020-01-07 MED ORDER — FENTANYL CITRATE (PF) 100 MCG/2ML IJ SOLN
INTRAMUSCULAR | Status: AC
  Administered 2020-01-07: 50 ug via INTRAVENOUS
  Filled 2020-01-07: qty 2

## 2020-01-07 MED ORDER — KETOROLAC TROMETHAMINE 15 MG/ML IJ SOLN
INTRAMUSCULAR | Status: AC
  Filled 2020-01-07: qty 1

## 2020-01-07 MED ORDER — MIDAZOLAM HCL 2 MG/2ML IJ SOLN
INTRAMUSCULAR | Status: AC
  Filled 2020-01-07: qty 2

## 2020-01-07 MED ORDER — PROPOFOL 10 MG/ML IV BOLUS
INTRAVENOUS | Status: DC | PRN
  Administered 2020-01-07: 100 mg via INTRAVENOUS

## 2020-01-07 MED ORDER — LIDOCAINE HCL 2 % IJ SOLN
INTRAMUSCULAR | Status: AC
  Filled 2020-01-07: qty 20

## 2020-01-07 MED ORDER — HYDROMORPHONE HCL 1 MG/ML IJ SOLN
0.5000 mg | INTRAMUSCULAR | Status: DC | PRN
  Administered 2020-01-07: 1 mg via INTRAVENOUS
  Filled 2020-01-07: qty 1

## 2020-01-07 MED ORDER — KETAMINE HCL 10 MG/ML IJ SOLN
INTRAMUSCULAR | Status: AC
  Filled 2020-01-07: qty 1

## 2020-01-07 MED ORDER — EPHEDRINE SULFATE-NACL 50-0.9 MG/10ML-% IV SOSY
PREFILLED_SYRINGE | INTRAVENOUS | Status: DC | PRN
  Administered 2020-01-07 (×2): 10 mg via INTRAVENOUS

## 2020-01-07 MED ORDER — SUGAMMADEX SODIUM 200 MG/2ML IV SOLN
INTRAVENOUS | Status: DC | PRN
  Administered 2020-01-07: 200 mg via INTRAVENOUS

## 2020-01-07 MED ORDER — CEFAZOLIN SODIUM-DEXTROSE 2-4 GM/100ML-% IV SOLN
INTRAVENOUS | Status: AC
  Filled 2020-01-07: qty 100

## 2020-01-07 MED ORDER — SIMETHICONE 80 MG PO CHEW
40.0000 mg | CHEWABLE_TABLET | Freq: Four times a day (QID) | ORAL | Status: DC | PRN
  Administered 2020-01-10 – 2020-01-13 (×2): 40 mg via ORAL
  Filled 2020-01-07 (×2): qty 1

## 2020-01-07 MED ORDER — LIDOCAINE 2% (20 MG/ML) 5 ML SYRINGE
INTRAMUSCULAR | Status: DC | PRN
  Administered 2020-01-07: 60 mg via INTRAVENOUS

## 2020-01-07 MED ORDER — ONDANSETRON 4 MG PO TBDP
4.0000 mg | ORAL_TABLET | Freq: Four times a day (QID) | ORAL | Status: DC | PRN
  Administered 2020-01-09 – 2020-01-13 (×3): 4 mg via ORAL
  Filled 2020-01-07 (×4): qty 1

## 2020-01-07 MED ORDER — ONDANSETRON HCL 4 MG/2ML IJ SOLN
4.0000 mg | Freq: Four times a day (QID) | INTRAMUSCULAR | Status: DC | PRN
  Administered 2020-01-07 – 2020-01-12 (×8): 4 mg via INTRAVENOUS
  Filled 2020-01-07 (×7): qty 2

## 2020-01-07 MED ORDER — CHLORHEXIDINE GLUCONATE 0.12 % MT SOLN
15.0000 mL | Freq: Once | OROMUCOSAL | Status: AC
  Administered 2020-01-07: 15 mL via OROMUCOSAL

## 2020-01-07 MED ORDER — FLUTICASONE PROPIONATE 50 MCG/ACT NA SUSP
1.0000 | Freq: Every day | NASAL | Status: DC
  Administered 2020-01-08 – 2020-01-14 (×7): 1 via NASAL
  Filled 2020-01-07: qty 16

## 2020-01-07 MED ORDER — ACETAMINOPHEN 500 MG PO TABS
ORAL_TABLET | ORAL | Status: AC
  Administered 2020-01-07: 1000 mg via ORAL
  Filled 2020-01-07: qty 2

## 2020-01-07 MED ORDER — LACTATED RINGERS IV SOLN: INTRAVENOUS | Status: DC

## 2020-01-07 MED ORDER — CEFAZOLIN SODIUM-DEXTROSE 2-4 GM/100ML-% IV SOLN
2.0000 g | INTRAVENOUS | Status: AC
  Administered 2020-01-07: 2 g via INTRAVENOUS

## 2020-01-07 MED ORDER — DEXAMETHASONE SODIUM PHOSPHATE 10 MG/ML IJ SOLN
INTRAMUSCULAR | Status: DC | PRN
  Administered 2020-01-07: 10 mg via INTRAVENOUS

## 2020-01-07 MED ORDER — ACETAMINOPHEN 500 MG PO TABS
1000.0000 mg | ORAL_TABLET | Freq: Four times a day (QID) | ORAL | Status: DC
  Administered 2020-01-07 – 2020-01-08 (×3): 1000 mg via ORAL
  Filled 2020-01-07 (×3): qty 2

## 2020-01-07 MED ORDER — BUPIVACAINE-EPINEPHRINE (PF) 0.25% -1:200000 IJ SOLN
INTRAMUSCULAR | Status: AC
  Filled 2020-01-07: qty 30

## 2020-01-07 MED ORDER — DEXAMETHASONE SODIUM PHOSPHATE 10 MG/ML IJ SOLN
INTRAMUSCULAR | Status: AC
  Filled 2020-01-07: qty 1

## 2020-01-07 MED ORDER — ACETAMINOPHEN 500 MG PO TABS: 1000.0000 mg | ORAL_TABLET | ORAL | Status: AC

## 2020-01-07 MED ORDER — DIPHENHYDRAMINE HCL 50 MG/ML IJ SOLN: 12.5000 mg | Freq: Four times a day (QID) | INTRAMUSCULAR | Status: DC | PRN

## 2020-01-07 MED ORDER — KETOROLAC TROMETHAMINE 15 MG/ML IJ SOLN: 15.0000 mg | Freq: Four times a day (QID) | INTRAMUSCULAR | Status: DC | PRN

## 2020-01-07 MED ORDER — ONDANSETRON HCL 4 MG/2ML IJ SOLN: 4.0000 mg | Freq: Once | INTRAMUSCULAR | Status: DC | PRN

## 2020-01-07 MED ORDER — CHLORHEXIDINE GLUCONATE CLOTH 2 % EX PADS: 6.0000 | MEDICATED_PAD | Freq: Once | CUTANEOUS | Status: DC

## 2020-01-07 MED ORDER — LORATADINE 10 MG PO TABS
10.0000 mg | ORAL_TABLET | Freq: Every evening | ORAL | Status: DC
  Administered 2020-01-07 – 2020-01-13 (×7): 10 mg via ORAL
  Filled 2020-01-07 (×7): qty 1

## 2020-01-07 MED ORDER — ROCURONIUM BROMIDE 10 MG/ML (PF) SYRINGE
PREFILLED_SYRINGE | INTRAVENOUS | Status: AC
  Filled 2020-01-07: qty 10

## 2020-01-07 MED ORDER — POLYETHYLENE GLYCOL 3350 17 G PO PACK
17.0000 g | PACK | Freq: Every day | ORAL | Status: DC | PRN
  Administered 2020-01-10: 17 g via ORAL
  Filled 2020-01-07: qty 1

## 2020-01-07 MED ORDER — HYDROCODONE-ACETAMINOPHEN 5-325 MG PO TABS
1.0000 | ORAL_TABLET | ORAL | Status: DC | PRN
  Administered 2020-01-07 – 2020-01-09 (×4): 1 via ORAL
  Administered 2020-01-09: 2 via ORAL
  Administered 2020-01-10 (×2): 1 via ORAL
  Administered 2020-01-10: 2 via ORAL
  Administered 2020-01-11 (×2): 1 via ORAL
  Administered 2020-01-12 – 2020-01-14 (×2): 2 via ORAL
  Filled 2020-01-07 (×4): qty 1
  Filled 2020-01-07: qty 2
  Filled 2020-01-07 (×8): qty 1
  Filled 2020-01-07 (×2): qty 2

## 2020-01-07 MED ORDER — ORAL CARE MOUTH RINSE: 15.0000 mL | Freq: Once | OROMUCOSAL | Status: AC

## 2020-01-07 MED ORDER — PROPOFOL 10 MG/ML IV BOLUS
INTRAVENOUS | Status: AC
  Filled 2020-01-07: qty 20

## 2020-01-07 MED ORDER — BUPIVACAINE-EPINEPHRINE 0.25% -1:200000 IJ SOLN
INTRAMUSCULAR | Status: DC | PRN
  Administered 2020-01-07: 30 mL

## 2020-01-07 MED ORDER — KCL IN DEXTROSE-NACL 20-5-0.45 MEQ/L-%-% IV SOLN
INTRAVENOUS | Status: DC
  Filled 2020-01-07 (×5): qty 1000

## 2020-01-07 MED ORDER — KETAMINE HCL 10 MG/ML IJ SOLN
INTRAMUSCULAR | Status: DC | PRN
  Administered 2020-01-07: 40 mg via INTRAVENOUS
  Administered 2020-01-07: 20 mg via INTRAVENOUS

## 2020-01-07 MED ORDER — ONDANSETRON HCL 4 MG/2ML IJ SOLN
INTRAMUSCULAR | Status: DC | PRN
  Administered 2020-01-07: 4 mg via INTRAVENOUS

## 2020-01-07 SURGICAL SUPPLY — 50 items
APL PRP STRL LF DISP 70% ISPRP (MISCELLANEOUS) ×1
APL SKNCLS STERI-STRIP NONHPOA (GAUZE/BANDAGES/DRESSINGS)
APPLIER CLIP 5 13 M/L LIGAMAX5 (MISCELLANEOUS)
APR CLP MED LRG 5 ANG JAW (MISCELLANEOUS)
BENZOIN TINCTURE PRP APPL 2/3 (GAUZE/BANDAGES/DRESSINGS) ×1 IMPLANT
BINDER ABDOMINAL 12 ML 46-62 (SOFTGOODS) ×1 IMPLANT
BNDG ADH 1X3 SHEER STRL LF (GAUZE/BANDAGES/DRESSINGS) ×4 IMPLANT
BNDG ADH THN 3X1 STRL LF (GAUZE/BANDAGES/DRESSINGS) ×1
CABLE HIGH FREQUENCY MONO STRZ (ELECTRODE) ×2 IMPLANT
CHLORAPREP W/TINT 26 (MISCELLANEOUS) ×2 IMPLANT
CLIP APPLIE 5 13 M/L LIGAMAX5 (MISCELLANEOUS) IMPLANT
COVER SURGICAL LIGHT HANDLE (MISCELLANEOUS) ×2 IMPLANT
COVER WAND RF STERILE (DRAPES) IMPLANT
DECANTER SPIKE VIAL GLASS SM (MISCELLANEOUS) ×2 IMPLANT
DEVICE SECURE STRAP 25 ABSORB (INSTRUMENTS) IMPLANT
DRAIN CHANNEL 19F RND (DRAIN) ×2 IMPLANT
DRSG TELFA 4X10 ISLAND STR (GAUZE/BANDAGES/DRESSINGS) ×1 IMPLANT
ELECT REM PT RETURN 15FT ADLT (MISCELLANEOUS) ×2 IMPLANT
EVACUATOR SILICONE 100CC (DRAIN) ×2 IMPLANT
GLOVE BIOGEL PI IND STRL 7.0 (GLOVE) ×1 IMPLANT
GLOVE BIOGEL PI INDICATOR 7.0 (GLOVE) ×2
GLOVE SURG SS PI 7.0 STRL IVOR (GLOVE) ×4 IMPLANT
GOWN STRL REUS W/TWL LRG LVL3 (GOWN DISPOSABLE) ×3 IMPLANT
GOWN STRL REUS W/TWL XL LVL3 (GOWN DISPOSABLE) ×3 IMPLANT
GRASPER SUT TROCAR 14GX15 (MISCELLANEOUS) ×2 IMPLANT
KIT BASIN OR (CUSTOM PROCEDURE TRAY) ×2 IMPLANT
KIT TURNOVER KIT A (KITS) IMPLANT
MESH PHASIX ST 25X30 (Mesh General) ×1 IMPLANT
PENCIL SMOKE EVACUATOR (MISCELLANEOUS) ×1 IMPLANT
SCISSORS LAP 5X35 DISP (ENDOMECHANICALS) ×2 IMPLANT
SET IRRIG TUBING LAPAROSCOPIC (IRRIGATION / IRRIGATOR) IMPLANT
SET TUBE SMOKE EVAC HIGH FLOW (TUBING) ×2 IMPLANT
SHEARS HARMONIC ACE PLUS 36CM (ENDOMECHANICALS) ×1 IMPLANT
SLEEVE XCEL OPT CAN 5 100 (ENDOMECHANICALS) ×2 IMPLANT
SPONGE DRAIN TRACH 4X4 STRL 2S (GAUZE/BANDAGES/DRESSINGS) ×2 IMPLANT
SPONGE LAP 18X18 RF (DISPOSABLE) ×2 IMPLANT
STRIP CLOSURE SKIN 1/2X4 (GAUZE/BANDAGES/DRESSINGS) ×2 IMPLANT
SUT ETHILON 2 0 PS N (SUTURE) ×2 IMPLANT
SUT MNCRL AB 4-0 PS2 18 (SUTURE) ×3 IMPLANT
SUT NOVA 1 T20/GS 25DT (SUTURE) ×2 IMPLANT
SUT NOVA NAB GS-21 0 18 T12 DT (SUTURE) ×2 IMPLANT
SUT PDS AB 0 CT 36 (SUTURE) ×3 IMPLANT
SUT SILK 3 0 SH CR/8 (SUTURE) ×2 IMPLANT
SUT VIC AB 3-0 SH 18 (SUTURE) ×1 IMPLANT
SUT VICRYL 0 UR6 27IN ABS (SUTURE) IMPLANT
TOWEL OR 17X26 10 PK STRL BLUE (TOWEL DISPOSABLE) ×2 IMPLANT
TRAY LAPAROSCOPIC (CUSTOM PROCEDURE TRAY) ×2 IMPLANT
TROCAR BLADELESS OPT 5 100 (ENDOMECHANICALS) ×3 IMPLANT
TROCAR XCEL 12X100 BLDLESS (ENDOMECHANICALS) ×2 IMPLANT
YANKAUER SUCT BULB TIP 10FT TU (MISCELLANEOUS) ×1 IMPLANT

## 2020-01-07 NOTE — Plan of Care (Signed)
  Problem: Education: Goal: Knowledge of General Education information will improve Description: Including pain rating scale, medication(s)/side effects and non-pharmacologic comfort measures Outcome: Progressing   Problem: Clinical Measurements: Goal: Will remain free from infection Outcome: Progressing   Problem: Activity: Goal: Risk for activity intolerance will decrease Outcome: Progressing   
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
## 2020-01-07 NOTE — Transfer of Care (Signed)
Immediate Anesthesia Transfer of Care Note  Patient: Rhonda [name]  Procedure(s) Performed: Procedure(s): LAPAROSCOPIC LYSIS OF ADHESIONS, OPEN LYSIS OF ADHESIONS, LAPAROSCOPIC CONVERTED TO OPEN INCISIONAL HERNIA REPAIR WITH MESH, SMALL BOWEL REPAIR (N/A)  Patient Location: PACU  Anesthesia Type:General  Level of Consciousness: Alert, Awake, Oriented  Airway & Oxygen Therapy: Patient Spontanous Breathing  Post-op Assessment: Report given to RN  Post vital signs: Reviewed and stable  Last Vitals:  Vitals:   01/07/20 0554  BP: (!) 123/57  Pulse: 64  Resp: 18  Temp: 36.4 C  SpO2: 100%    Complications: No apparent anesthesia complications

## 2020-01-07 NOTE — Anesthesia Procedure Notes (Signed)
Procedure Name: Intubation Date/Time: 01/07/2020 7:35 AM Performed by: Gerald Leitz, CRNA Pre-anesthesia Checklist: Patient identified, Patient being monitored, Timeout performed, Emergency Drugs available and Suction available Patient Re-evaluated:Patient Re-evaluated prior to induction Oxygen Delivery Method: Circle system utilized Preoxygenation: Pre-oxygenation with 100% oxygen Induction Type: IV induction Ventilation: Mask ventilation without difficulty Laryngoscope Size: Mac and 3 Grade View: Grade I Tube type: Oral Tube size: 7.0 mm Number of attempts: 1 Placement Confirmation: ETT inserted through vocal cords under direct vision,  positive ETCO2 and breath sounds checked- equal and bilateral Secured at: 21 cm Tube secured with: Tape Dental Injury: Teeth and Oropharynx as per pre-operative assessment

## 2020-01-07 NOTE — Anesthesia Postprocedure Evaluation (Signed)
Anesthesia Post Note  Patient: Rhonda [name]  Procedure(s) Performed: LAPAROSCOPIC LYSIS OF ADHESIONS, OPEN LYSIS OF ADHESIONS, LAPAROSCOPIC CONVERTED TO OPEN INCISIONAL HERNIA REPAIR WITH MESH, SMALL BOWEL REPAIR (N/A Abdomen)     Patient location during evaluation: PACU Anesthesia Type: General Level of consciousness: awake Pain management: pain level controlled Vital Signs Assessment: post-procedure vital signs reviewed and stable Respiratory status: spontaneous breathing, nonlabored ventilation, respiratory function stable and patient connected to nasal cannula oxygen Cardiovascular status: blood pressure returned to baseline and stable Postop Assessment: no apparent nausea or vomiting Anesthetic complications: no   No complications documented.  Last Vitals:  Vitals:   01/07/20 1516 01/07/20 1604  BP: 140/70 129/80  Pulse: 65 66  Resp:  20  Temp: 36.5 C (!) 36.4 C  SpO2: 100% 100%    Last Pain:  Vitals:   01/07/20 1604  TempSrc: Oral  PainSc: 0-No pain                 Kyon Bentler P Turner Baillie

## 2020-01-07 NOTE — Op Note (Signed)
Preoperative diagnosis: recurrent incisional hernia  Postoperative diagnosis: same   Procedure: recurrent incisional hernia repair with mesh, lysis of adhesions, small bowel repair  Surgeon: Feliciana Rossetti, M.D.  Asst: none  Anesthesia: general  Indications for procedure: Rhonda [name] is a 69 y.o. year old female with symptoms of abdominal pain, nausea and recurrent bowel obstructions. She had multiple previous surgeries related to hernias and bowel obstructions. We attempted nonoperative management but she has been hospitalized and not tolerating food well so decision was made to proceed with elective repair.  Description of procedure: The patient was brought into the operative suite. Anesthesia was administered with General endotracheal anesthesia. WHO checklist was applied. The patient was then placed in supine position. The area was prepped and draped in the usual sterile fashion.  Next, a left subcostal incision was made. A 24mm trocar was used to gain access to the peritoneal cavity by optical entry technique. Pneumoperitoneum was applied with a high flow and low pressure. The laparoscope was reinserted to confirm position. There were multiple adhesions of the omentum and small intestine. 2 additional 4 mm trocars were placed one in the left mid abdomen and one in the left lower abdomen. Blunt and sharp dissection occurred to free omentum from the left side of the abdomen. A lower midline hernia was identified with loops of intestine into the sac. Attention was turned to the upper abdomen multiple loops of small intestine was adhered to the abdominal wall. Careful sharp and blunt dissection was used to free up multiple loops. There was one dense adhesion and in cutting this there was a full thickness small intestine injury. Due to the level of adhesions further and this injury decision was made to open. Enough of the superior abdomen was open to allow safe entry.  A midline incision was  made. Cautery was used to dissect through the subcutaneous tissue. The fascia was safely entered. The small intestine was pushed away from the midline and the fascia was further opened with cautery. The lower midline hernia was entered and sac safely divided. Next, sharp dissection was used to free small intestine away from the abdominal wall. It appeared the posterior fascia had fallen away from the mesh and led to dense adhesions of unprotected mesh to the small intestine. Once there entire abdominal wall was cleared of the small intestine, interloop adhesions were lysed with scissors or cautery. Total lysis time was 100 minutes. The small intestine injury was identified and closed with interrupted 3-0 vicryl and imbricated with 3-0 silk Lembert sutures. The small intestine was run from ligament of Trietz to terminal ileum and one other area of intestine had serosal injury closed with 3-0 silk Lembert. All inter loop adhesions were fully lysed.  The posterior rectus was wide and it did not appear that it could be mobilized to bring today. The subcutaneous tissue was separated from the anterior rectus sheath which led to improved mobility. Marcaine/exparel mix was infused into the rectus muscles. A Phasix ST mesh was used as underlay to close the defect and high the polypropylene mesh that was previously placed. It was sutured very laterally with interrupted 0 novafil sutures. Next, the previous polypropylene mesh was re-approximated with interrupted 0 novafil sutures in the midline. A 19 fr blake drain was placed above the mesh and below the fascia and brought out the right abdomen and sutured in place with 3-0 nylon. The anterior fascia was closed with 0 PDS in running fashion. A subcutaneous drain was placed  above the fascia and brought out the left abdomen and sutured in place with 3-0 nylon. The subcutaneous tissue was re-approximated with 3-0 vicryl in interrupted fashion. The skin was closed with 4-0  monocyrl in running fashion. The previous port sites were closed with 4-0 monocryl as well. Dressings were put in place. All counts were correct. She was brought to pacu in stable condition  Findings: lower midline hernia, small left sided hernia  Specimen: none  Implant: 25 x 30 cm Phasix ST   Blood loss: 75 ml  Local anesthesia: 50 ml Exparel:Marcaine Mix  Complications: none  Feliciana Rossetti, M.D. General, Bariatric, & Minimally Invasive Surgery [facility]

## 2020-01-07 NOTE — Plan of Care (Signed)
Pt rec'd to unit from PACU care plans initiated. Ceasar Lund, RN

## 2020-01-08 ENCOUNTER — Encounter (HOSPITAL_COMMUNITY): Payer: Self-pay | Admitting: General Surgery

## 2020-01-08 LAB — PHOSPHORUS: Phosphorus: 4.2 mg/dL (ref 2.5–4.6)

## 2020-01-08 LAB — CBC
HCT: 37.2 % (ref 36.0–46.0)
Hemoglobin: 12.4 g/dL (ref 12.0–15.0)
MCH: 28.8 pg (ref 26.0–34.0)
MCHC: 33.3 g/dL (ref 30.0–36.0)
MCV: 86.5 fL (ref 80.0–100.0)
Platelets: 283 10*3/uL (ref 150–400)
RBC: 4.3 MIL/uL (ref 3.87–5.11)
RDW: 14.3 % (ref 11.5–15.5)
WBC: 12.7 10*3/uL — ABNORMAL HIGH (ref 4.0–10.5)
nRBC: 0 % (ref 0.0–0.2)

## 2020-01-08 LAB — BASIC METABOLIC PANEL
Anion gap: 10 (ref 5–15)
BUN: 18 mg/dL (ref 8–23)
CO2: 25 mmol/L (ref 22–32)
Calcium: 8 mg/dL — ABNORMAL LOW (ref 8.9–10.3)
Chloride: 98 mmol/L (ref 98–111)
Creatinine, Ser: 1.19 mg/dL — ABNORMAL HIGH (ref 0.44–1.00)
GFR calc Af Amer: 54 mL/min — ABNORMAL LOW (ref >60)
GFR calc non Af Amer: 46 mL/min — ABNORMAL LOW (ref >60)
Glucose, Bld: 171 mg/dL — ABNORMAL HIGH (ref 70–99)
Potassium: 3.8 mmol/L (ref 3.5–5.1)
Sodium: 133 mmol/L — ABNORMAL LOW (ref 135–145)

## 2020-01-08 LAB — MAGNESIUM: Magnesium: 1.1 mg/dL — ABNORMAL LOW (ref 1.7–2.4)

## 2020-01-08 MED ORDER — ACETAMINOPHEN 325 MG PO TABS: 650.0000 mg | ORAL_TABLET | Freq: Four times a day (QID) | ORAL | Status: DC | PRN

## 2020-01-08 MED ORDER — MAGNESIUM SULFATE 4 GM/100ML IV SOLN
4.0000 g | Freq: Once | INTRAVENOUS | Status: AC
  Administered 2020-01-08: 4 g via INTRAVENOUS
  Filled 2020-01-08: qty 100

## 2020-01-08 NOTE — Progress Notes (Signed)
Progress Note: General Surgery Service   Chief Complaint/Subjective: Pain moderate, no appetite, no nausea, tolerating liquids, up to chair  Objective: Vital signs in last 24 hours: Temp:  [97 F (36.1 C)-98.7 F (37.1 C)] 98.7 F (37.1 C) (09/08 0505) Pulse Rate:  [64-74] 71 (09/08 0505) Resp:  [16-20] 18 (09/08 0505) BP: (112-166)/(50-89) 118/70 (09/08 0505) SpO2:  [99 %-100 %] 99 % (09/08 0505) Last BM Date: 01/06/20  Intake/Output from previous day: 09/07 0701 - 09/08 0700 In: 4746.7 [P.O.:550; I.V.:4096.7; IV Piggyback:100] Out: 941 [Urine:375; Drains:491; Blood:75] Intake/Output this shift: No intake/output data recorded.  Gen: NAD  Resp: nonlabored  Card: RRR  Abd: soft, appropriately tender small seep through in midline wound, drains with serosanguinous output  Lab Results: CBC  Recent Labs    01/07/20 1317 01/08/20 0319  WBC 15.0* 12.7*  HGB 12.9 12.4  HCT 38.6 37.2  PLT 259 283   BMET Recent Labs    01/07/20 1317 01/08/20 0319  NA  --  133*  K  --  3.8  CL  --  98  CO2  --  25  GLUCOSE  --  171*  BUN  --  18  CREATININE 1.08* 1.19*  CALCIUM  --  8.0*   PT/INR No results for input(s): LABPROT, INR in the last 72 hours. ABG No results for input(s): PHART, HCO3 in the last 72 hours.  Invalid input(s): PCO2, PO2  Anti-infectives: Anti-infectives (From admission, onward)   Start     Dose/Rate Route Frequency Ordered Stop   01/07/20 0600  ceFAZolin (ANCEF) IVPB 2g/100 mL premix        2 g 200 mL/hr over 30 Minutes Intravenous On call to O.R. 01/07/20 0535 01/07/20 0737   01/07/20 0545  ceFAZolin (ANCEF) 2-4 GM/100ML-% IVPB       Note to Pharmacy: Montel Clock   : cabinet override      01/07/20 0545 01/07/20 0732      Medications: Scheduled Meds: . dorzolamide-timolol  1 drop Both Eyes BID  . enoxaparin (LOVENOX) injection  40 mg Subcutaneous Q24H  . fluticasone  1 spray Each Nare Daily  . gabapentin  300 mg Oral BID  .  loratadine  10 mg Oral QPM  . losartan  100 mg Oral Daily  . pantoprazole  40 mg Oral Daily   Continuous Infusions: . dextrose 5 % and 0.45 % NaCl with KCl 20 mEq/L 100 mL/hr at 01/07/20 2357  . magnesium sulfate bolus IVPB     PRN Meds:.acetaminophen, albuterol, diphenhydrAMINE **OR** diphenhydrAMINE, HYDROcodone-acetaminophen, HYDROmorphone (DILAUDID) injection, ondansetron **OR** ondansetron (ZOFRAN) IV, polyethylene glycol, simethicone, traMADol  Assessment/Plan: s/p Procedure(s): LAPAROSCOPIC LYSIS OF ADHESIONS, OPEN LYSIS OF ADHESIONS, LAPAROSCOPIC CONVERTED TO OPEN INCISIONAL HERNIA REPAIR WITH MESH, SMALL BOWEL REPAIR 01/07/2020 -advance to full liquids -stop NSAID due to small Cr bump -replace Mg -repeat labs tomorrow -ambulate/PT -await return bowel function -change tylenol to PRN due to nontolerance of oxycodone on vicodin -binder as needed    LOS: 1 day   Rodman Pickle, MD [phone] [facility]

## 2020-01-08 NOTE — Evaluation (Signed)
Physical Therapy Evaluation Patient Details Name: Rhonda [name] MRN: [id number] DOB: 1951/04/03 Today's Date: 01/08/2020   History of Present Illness  LAPAROSCOPIC LYSIS OF ADHESIONS, OPEN LYSIS OF ADHESIONS, LAPAROSCOPIC CONVERTED TO OPEN INCISIONAL HERNIA REPAIR WITH MESH, SMALL BOWEL REPAIR 01/07/2020 PMH: hernia repair, PLIF, remote patella fx  Clinical Impression  Pt admitted with above diagnosis.  PT independent at her baseline. amb ~ 76' with RW and min assist today, incr pain with mobility. Anticipate pt will progress steadily, will follow in acute setting    Pt currently with functional limitations due to the deficits listed below (see PT Problem List). Pt will benefit from skilled PT to increase their independence and safety with mobility to allow discharge to the venue listed below.     Follow Up Recommendations No PT follow up    Equipment Recommendations  None recommended by PT    Recommendations for Other Services       Precautions / Restrictions Precautions Precautions: Fall Required Braces or Orthoses: Other Brace Other Brace: abd binder      Mobility  Bed Mobility Overal bed mobility: Needs Assistance Bed Mobility: Sit to Sidelying         Sit to sidelying: Max assist General bed mobility comments: unable to transition to s/l, assist with bil LEs and descent of trunk, able to reposition self in supine with cues and incr time  Transfers Overall transfer level: Needs assistance Equipment used: Rolling walker (2 wheeled) Transfers: Sit to/from Stand Sit to Stand: Min assist;Mod assist         General transfer comment: cues for hand placement and to power up with LEs, from recliner and BSC  Ambulation/Gait Ambulation/Gait assistance: Min assist Gait Distance (Feet): 50 Feet Assistive device: Rolling walker (2 wheeled) Gait Pattern/deviations: Step-through pattern;Decreased stride length     General Gait Details: cues for RW position,  intermittent assist to maneuver RW  Stairs            Wheelchair Mobility    Modified Rankin (Stroke Patients Only)       Balance Overall balance assessment: Needs assistance   Sitting balance-Leahy Scale: Fair       Standing balance-Leahy Scale: Fair                               Pertinent Vitals/Pain Pain Assessment: Faces Faces Pain Scale: Hurts whole lot Pain Location: abd with mobility Pain Descriptors / Indicators: Grimacing;Sore Pain Intervention(s): Limited activity within patient's tolerance;Monitored during session;Repositioned;Premedicated before session    Home Living Family/patient expects to be discharged to:: Private residence Living Arrangements: Parent;Children Available Help at Discharge: Family;Available 24 hours/day Type of Home: House Home Access: Stairs to enter Entrance Stairs-Rails: Right;Left;Can reach both Entrance Stairs-Number of Steps: 4 Home Layout: Two level;Able to live on main level with bedroom/bathroom Home Equipment: Dan Humphreys - 2 wheels;Shower seat;Hand held shower head;Grab bars - tub/shower Additional Comments: lives with/cares for  her mother 539 yo), supportive dtr available to assist    Prior Function Level of Independence: Independent               Hand Dominance        Extremity/Trunk Assessment   Upper Extremity Assessment Upper Extremity Assessment: Overall WFL for tasks assessed    Lower Extremity Assessment Lower Extremity Assessment: Generalized weakness       Communication   Communication: No difficulties  Cognition Arousal/Alertness: Awake/alert Behavior During Therapy: WFL for  tasks assessed/performed Overall Cognitive Status: Within Functional Limits for tasks assessed                                        General Comments      Exercises     Assessment/Plan    PT Assessment Patient needs continued PT services  PT Problem List Decreased  strength;Decreased activity tolerance;Decreased balance;Decreased knowledge of use of DME;Pain;Decreased mobility;Obesity       PT Treatment Interventions DME instruction;Therapeutic exercise;Gait training;Functional mobility training;Therapeutic activities;Patient/family education;Stair training    PT Goals (Current goals can be found in the Care Plan section)  Acute Rehab PT Goals Patient Stated Goal: feel better and be able to eat PT Goal Formulation: With patient Time For Goal Achievement: 01/22/20 Potential to Achieve Goals: Good    Frequency Min 3X/week   Barriers to discharge        Co-evaluation               AM-PAC PT "6 Clicks" Mobility  Outcome Measure Help needed turning from your back to your side while in a flat bed without using bedrails?: A Lot Help needed moving from lying on your back to sitting on the side of a flat bed without using bedrails?: A Lot Help needed moving to and from a bed to a chair (including a wheelchair)?: A Little Help needed standing up from a chair using your arms (e.g., wheelchair or bedside chair)?: A Little Help needed to walk in hospital room?: A Little Help needed climbing 3-5 steps with a railing? : A Lot 6 Click Score: 15    End of Session Equipment Utilized During Treatment: Gait belt Activity Tolerance: Patient tolerated treatment well Patient left: in bed;with call bell/phone within reach;with bed alarm set;with family/visitor present   PT Visit Diagnosis: Difficulty in walking, not elsewhere classified (R26.2);Other abnormalities of gait and mobility (R26.89)    Time: 2952-8413 PT Time Calculation (min) (ACUTE ONLY): 32 min   Charges:   PT Evaluation $PT Eval Low Complexity: 1 Low PT Treatments $Gait Training: 8-22 mins        Delice Bison, PT  Acute Rehab Dept ([facility]) [phone] Pager [phone]  01/08/2020   [facility] 01/08/2020, 11:07 AM

## 2020-01-09 LAB — BASIC METABOLIC PANEL WITH GFR
Anion gap: 5 (ref 5–15)
BUN: 14 mg/dL (ref 8–23)
CO2: 27 mmol/L (ref 22–32)
Calcium: 8.2 mg/dL — ABNORMAL LOW (ref 8.9–10.3)
Chloride: 102 mmol/L (ref 98–111)
Creatinine, Ser: 0.85 mg/dL (ref 0.44–1.00)
GFR calc Af Amer: >60 mL/min
GFR calc non Af Amer: >60 mL/min
Glucose, Bld: 108 mg/dL — ABNORMAL HIGH (ref 70–99)
Potassium: 3.6 mmol/L (ref 3.5–5.1)
Sodium: 134 mmol/L — ABNORMAL LOW (ref 135–145)

## 2020-01-09 LAB — CBC
HCT: 33.8 % — ABNORMAL LOW (ref 36.0–46.0)
Hemoglobin: 11.2 g/dL — ABNORMAL LOW (ref 12.0–15.0)
MCH: 28.7 pg (ref 26.0–34.0)
MCHC: 33.1 g/dL (ref 30.0–36.0)
MCV: 86.7 fL (ref 80.0–100.0)
Platelets: 252 K/uL (ref 150–400)
RBC: 3.9 MIL/uL (ref 3.87–5.11)
RDW: 14.6 % (ref 11.5–15.5)
WBC: 9.3 K/uL (ref 4.0–10.5)
nRBC: 0 % (ref 0.0–0.2)

## 2020-01-09 LAB — MAGNESIUM: Magnesium: 2 mg/dL (ref 1.7–2.4)

## 2020-01-09 MED ORDER — ENSURE ENLIVE PO LIQD
237.0000 mL | Freq: Two times a day (BID) | ORAL | Status: DC
  Administered 2020-01-09 – 2020-01-14 (×9): 237 mL via ORAL

## 2020-01-09 NOTE — Progress Notes (Signed)
Physical Therapy Treatment Patient Details Name: Rhonda [name] MRN: [id number] DOB: 1950-07-30 Today's Date: 01/09/2020    History of Present Illness LAPAROSCOPIC LYSIS OF ADHESIONS, OPEN LYSIS OF ADHESIONS, LAPAROSCOPIC CONVERTED TO OPEN INCISIONAL HERNIA REPAIR WITH MESH, SMALL BOWEL REPAIR 01/07/2020 PMH: hernia repair, PLIF, remote patella fx    PT Comments    Pt cooperative an up to ambulate increased distance in hall with RW.  Pt pleased with progress.   Follow Up Recommendations  No PT follow up     Equipment Recommendations  None recommended by PT    Recommendations for Other Services       Precautions / Restrictions Precautions Precautions: Fall Required Braces or Orthoses: Other Brace Other Brace: abd binder Restrictions Weight Bearing Restrictions: No    Mobility  Bed Mobility               General bed mobility comments: Pt up in chair and requests to sit on comode at end of session  Transfers Overall transfer level: Needs assistance Equipment used: Rolling walker (2 wheeled) Transfers: Sit to/from Stand Sit to Stand: Min assist;Mod assist         General transfer comment: cues for hand placement and to power up with LEs, from recliner   Ambulation/Gait Ambulation/Gait assistance: Min assist;Min guard Gait Distance (Feet): 200 Feet Assistive device: Rolling walker (2 wheeled) Gait Pattern/deviations: Step-through pattern;Decreased stride length Gait velocity: decr   General Gait Details: cues for posture and RW position, intermittent assist to maneuver RW   Stairs             Wheelchair Mobility    Modified Rankin (Stroke Patients Only)       Balance Overall balance assessment: Needs assistance Sitting-balance support: Feet supported;No upper extremity supported Sitting balance-Leahy Scale: Fair     Standing balance support: Bilateral upper extremity supported Standing balance-Leahy Scale: Fair                               Cognition Arousal/Alertness: Awake/alert Behavior During Therapy: WFL for tasks assessed/performed Overall Cognitive Status: Within Functional Limits for tasks assessed                                        Exercises      General Comments        Pertinent Vitals/Pain Pain Assessment: Faces Faces Pain Scale: Hurts little more Pain Location: abd with mobility Pain Descriptors / Indicators: Grimacing;Sore Pain Intervention(s): Limited activity within patient's tolerance;Monitored during session    Home Living                      Prior Function            PT Goals (current goals can now be found in the care plan section) Acute Rehab PT Goals Patient Stated Goal: feel better and be able to eat PT Goal Formulation: With patient Time For Goal Achievement: 01/22/20 Potential to Achieve Goals: Good Progress towards PT goals: Progressing toward goals    Frequency    Min 3X/week      PT Plan Current plan remains appropriate    Co-evaluation              AM-PAC PT "6 Clicks" Mobility   Outcome Measure  Help needed turning from your back to your side  while in a flat bed without using bedrails?: A Lot Help needed moving from lying on your back to sitting on the side of a flat bed without using bedrails?: A Lot Help needed moving to and from a bed to a chair (including a wheelchair)?: A Little Help needed standing up from a chair using your arms (e.g., wheelchair or bedside chair)?: A Little Help needed to walk in hospital room?: A Little Help needed climbing 3-5 steps with a railing? : A Lot 6 Click Score: 15    End of Session Equipment Utilized During Treatment: Gait belt Activity Tolerance: Patient tolerated treatment well Patient left: Other (comment) (bathroom) Nurse Communication: Mobility status PT Visit Diagnosis: Difficulty in walking, not elsewhere classified (R26.2);Other abnormalities of gait and mobility  (R26.89)     Time: 1610-9604 PT Time Calculation (min) (ACUTE ONLY): 20 min  Charges:  $Gait Training: 8-22 mins                     Rhonda [name] PT Acute Rehabilitation Services Pager [phone] Office [phone]    Rhonda [name] 01/09/2020, 4:32 PM

## 2020-01-10 MED ORDER — HYDROCODONE-ACETAMINOPHEN 5-325 MG PO TABS: 1.0000 | ORAL_TABLET | Freq: Four times a day (QID) | ORAL | 0 refills | Status: DC | PRN

## 2020-01-10 MED ORDER — IBUPROFEN 600 MG PO TABS: 600.0000 mg | ORAL_TABLET | Freq: Four times a day (QID) | ORAL | 0 refills | Status: DC | PRN

## 2020-01-10 NOTE — Progress Notes (Signed)
S: continue pain, tolerating liquids O: BP 113/62 (BP Location: Right Arm)   Pulse 72   Temp 98.2 F (36.8 C) (Oral)   Resp 16   Ht 5\' 3"  (1.6 m)   Wt 103.4 kg   SpO2 97%   BMI 40.39 kg/m  Gen: NAD Neuro: AOx4 Abd: soft, NT, incision c/d/i, drains with serosang output  A/P s/p incisional hernia repair with mesh and lysis of adhesions Advance to carb mod diet Continue ambulation Await return of bowel function

## 2020-01-10 NOTE — Progress Notes (Signed)
Progress Note: General Surgery Service   Chief Complaint/Subjective: Pain improved since getting out of bed. Getting up in the morning is still rough. She is tolerating some food. She has not had a BM or flatus  Objective: Vital signs in last 24 hours: Temp:  [98.2 F (36.8 C)-98.6 F (37 C)] 98.2 F (36.8 C) (09/10 0522) Pulse Rate:  [70-80] 72 (09/10 0522) Resp:  [16] 16 (09/10 0522) BP: (111-117)/(59-68) 113/62 (09/10 0522) SpO2:  [97 %-100 %] 97 % (09/10 0522) Last BM Date: 01/09/20  Intake/Output from previous day: 09/09 0701 - 09/10 0700 In: 120 [P.O.:120] Out: 90 [Drains:90] Intake/Output this shift: Total I/O In: -  Out: 25 [Drains:25]  Gen: NAd  Resp: nonlabored  Card: RRR  Abd: soft, incision c/d/i, drains with serosanguinous output  Lab Results: CBC  Recent Labs    01/08/20 0319 01/09/20 0325  WBC 12.7* 9.3  HGB 12.4 11.2*  HCT 37.2 33.8*  PLT 283 252   BMET Recent Labs    01/08/20 0319 01/09/20 0325  NA 133* 134*  K 3.8 3.6  CL 98 102  CO2 25 27  GLUCOSE 171* 108*  BUN 18 14  CREATININE 1.19* 0.85  CALCIUM 8.0* 8.2*   PT/INR No results for input(s): LABPROT, INR in the last 72 hours. ABG No results for input(s): PHART, HCO3 in the last 72 hours.  Invalid input(s): PCO2, PO2  Anti-infectives: Anti-infectives (From admission, onward)   Start     Dose/Rate Route Frequency Ordered Stop   01/07/20 0600  ceFAZolin (ANCEF) IVPB 2g/100 mL premix        2 g 200 mL/hr over 30 Minutes Intravenous On call to O.R. 01/07/20 0535 01/07/20 0737   01/07/20 0545  ceFAZolin (ANCEF) 2-4 GM/100ML-% IVPB       Note to Pharmacy: Montel Clock   : cabinet override      01/07/20 0545 01/07/20 0732      Medications: Scheduled Meds: . dorzolamide-timolol  1 drop Both Eyes BID  . enoxaparin (LOVENOX) injection  40 mg Subcutaneous Q24H  . feeding supplement (ENSURE ENLIVE)  237 mL Oral BID BM  . fluticasone  1 spray Each Nare Daily  . gabapentin   300 mg Oral BID  . loratadine  10 mg Oral QPM  . losartan  100 mg Oral Daily  . pantoprazole  40 mg Oral Daily   Continuous Infusions: PRN Meds:.acetaminophen, albuterol, diphenhydrAMINE **OR** diphenhydrAMINE, HYDROcodone-acetaminophen, HYDROmorphone (DILAUDID) injection, ondansetron **OR** ondansetron (ZOFRAN) IV, polyethylene glycol, simethicone, traMADol  Assessment/Plan: s/p Procedure(s): LAPAROSCOPIC LYSIS OF ADHESIONS, OPEN LYSIS OF ADHESIONS, LAPAROSCOPIC CONVERTED TO OPEN INCISIONAL HERNIA REPAIR WITH MESH, SMALL BOWEL REPAIR 01/07/2020 -miralax today -continue pain control -likely home tomorrow   LOS: 3 days   Rodman Pickle, MD [phone] [facility]

## 2020-01-10 NOTE — Care Management Important Message (Signed)
Important Message  Patient Details IM Letter given to the Patient Name: Rhonda [name] MRN: [id number] Date of Birth: 1950-12-28   Medicare Important Message Given:  Yes     Caren Macadam 01/10/2020, 2:07 PM

## 2020-01-10 NOTE — Progress Notes (Signed)
Physical Therapy Treatment Patient Details Name: Rhonda [name] MRN: [id number] DOB: [birth year] Today's Date: 01/10/2020    History of Present Illness LAPAROSCOPIC LYSIS OF ADHESIONS, OPEN LYSIS OF ADHESIONS, LAPAROSCOPIC CONVERTED TO OPEN INCISIONAL HERNIA REPAIR WITH MESH, SMALL BOWEL REPAIR 01/07/2020 PMH: hernia repair, PLIF, remote patella fx    PT Comments    Pt in good spirits and progressing with mobility.  Pt requires increased time for all tasks but with noted increased activity tolerance and decreased assist required for all tasks.   Follow Up Recommendations  No PT follow up     Equipment Recommendations  None recommended by PT    Recommendations for Other Services       Precautions / Restrictions Precautions Precautions: Fall Required Braces or Orthoses: Other Brace Other Brace: abd binder    Mobility  Bed Mobility Overal bed mobility: Needs Assistance Bed Mobility: Rolling;Sidelying to Sit Rolling: Min assist Sidelying to sit: Min assist;Mod assist       General bed mobility comments: Increased time with cues for log roll technique and physical assist to bring trunk to upright  Transfers Overall transfer level: Needs assistance Equipment used: Rolling walker (2 wheeled) Transfers: Sit to/from Stand Sit to Stand: Min assist         General transfer comment: min cues for use of UEs to self assist  Ambulation/Gait Ambulation/Gait assistance: Min guard Gait Distance (Feet): 250 Feet Assistive device: Rolling walker (2 wheeled) Gait Pattern/deviations: Step-through pattern;Decreased stride length Gait velocity: decr   General Gait Details: cues for posture and position from RW; steady assist only   Stairs             Wheelchair Mobility    Modified Rankin (Stroke Patients Only)       Balance Overall balance assessment: Needs assistance Sitting-balance support: Feet supported;No upper extremity supported Sitting balance-Leahy  Scale: Good     Standing balance support: Bilateral upper extremity supported Standing balance-Leahy Scale: Fair                              Cognition Arousal/Alertness: Awake/alert Behavior During Therapy: WFL for tasks assessed/performed Overall Cognitive Status: Within Functional Limits for tasks assessed                                        Exercises      General Comments        Pertinent Vitals/Pain Pain Assessment: 0-10 Faces Pain Scale: Hurts even more Pain Location: abd with mobility Pain Descriptors / Indicators: Sore;Grimacing;Guarding Pain Intervention(s): Limited activity within patient's tolerance;Monitored during session;Patient requesting pain meds-RN notified    Home Living                      Prior Function            PT Goals (current goals can now be found in the care plan section) Acute Rehab PT Goals Patient Stated Goal: Regain IND PT Goal Formulation: With patient Time For Goal Achievement: 01/22/20 Potential to Achieve Goals: Good Progress towards PT goals: Progressing toward goals    Frequency    Min 3X/week      PT Plan Current plan remains appropriate    Co-evaluation              AM-PAC PT "6 Clicks" Mobility  Outcome Measure  Help needed turning from your back to your side while in a flat bed without using bedrails?: A Little Help needed moving from lying on your back to sitting on the side of a flat bed without using bedrails?: A Lot Help needed moving to and from a bed to a chair (including a wheelchair)?: A Little Help needed standing up from a chair using your arms (e.g., wheelchair or bedside chair)?: A Little Help needed to walk in hospital room?: A Little Help needed climbing 3-5 steps with a railing? : A Little 6 Click Score: 17    End of Session Equipment Utilized During Treatment: Gait belt Activity Tolerance: Patient tolerated treatment well Patient left: in  chair;with call bell/phone within reach Nurse Communication: Mobility status PT Visit Diagnosis: Difficulty in walking, not elsewhere classified (R26.2);Other abnormalities of gait and mobility (R26.89)     Time: 1610-9604 PT Time Calculation (min) (ACUTE ONLY): 24 min  Charges:  $Gait Training: 23-37 mins                     Mauro Kaufmann PT Acute Rehabilitation Services Pager [phone] Office [phone]    Nica Friske 01/10/2020, 9:01 AM

## 2020-01-11 MED ORDER — KCL IN DEXTROSE-NACL 20-5-0.45 MEQ/L-%-% IV SOLN
INTRAVENOUS | Status: DC
  Filled 2020-01-11 (×7): qty 1000

## 2020-01-11 NOTE — Progress Notes (Signed)
Physical Therapy Treatment Patient Details Name: Rhonda [name] MRN: [id number] DOB: 04/05/1951 Today's Date: 01/11/2020    History of Present Illness LAPAROSCOPIC LYSIS OF ADHESIONS, OPEN LYSIS OF ADHESIONS, LAPAROSCOPIC CONVERTED TO OPEN INCISIONAL HERNIA REPAIR WITH MESH, SMALL BOWEL REPAIR 01/07/2020 PMH: hernia repair, PLIF, remote patella fx    PT Comments    Pt reports continued nausea but improved from this am and pt willing to attempt mobility.  Pt moving slowly but able to ambulate 250' in hall with RW.   Follow Up Recommendations  No PT follow up     Equipment Recommendations  Rolling walker with 5" wheels    Recommendations for Other Services       Precautions / Restrictions Precautions Precautions: Fall Restrictions Weight Bearing Restrictions: No    Mobility  Bed Mobility Overal bed mobility: Needs Assistance Bed Mobility: Sit to Supine Rolling: Min assist       Sit to sidelying: Min assist;Mod assist General bed mobility comments: Increased time with cues for log roll technique and physical assist to bring LEs onto bed  Transfers Overall transfer level: Needs assistance Equipment used: Rolling walker (2 wheeled) Transfers: Sit to/from Stand Sit to Stand: Min assist         General transfer comment: min cues for use of UEs to self assist  Ambulation/Gait Ambulation/Gait assistance: Min guard Gait Distance (Feet): 250 Feet Assistive device: Rolling walker (2 wheeled) Gait Pattern/deviations: Step-through pattern;Decreased stride length Gait velocity: decr   General Gait Details: cues for posture and position from RW; steady assist only   Stairs             Wheelchair Mobility    Modified Rankin (Stroke Patients Only)       Balance Overall balance assessment: Needs assistance Sitting-balance support: Feet supported;No upper extremity supported Sitting balance-Leahy Scale: Good     Standing balance support: Bilateral upper  extremity supported Standing balance-Leahy Scale: Fair                              Cognition Arousal/Alertness: Awake/alert Behavior During Therapy: WFL for tasks assessed/performed Overall Cognitive Status: Within Functional Limits for tasks assessed                                        Exercises      General Comments        Pertinent Vitals/Pain Pain Assessment: 0-10 Faces Pain Scale: Hurts little more Pain Location: abd with mobility Pain Descriptors / Indicators: Sore;Grimacing;Guarding Pain Intervention(s): Limited activity within patient's tolerance;Monitored during session;Premedicated before session    Home Living                      Prior Function            PT Goals (current goals can now be found in the care plan section) Acute Rehab PT Goals Patient Stated Goal: Regain IND PT Goal Formulation: With patient Time For Goal Achievement: 01/22/20 Potential to Achieve Goals: Good Progress towards PT goals: Progressing toward goals    Frequency    Min 3X/week      PT Plan Current plan remains appropriate    Co-evaluation              AM-PAC PT "6 Clicks" Mobility   Outcome Measure  Help needed turning from  your back to your side while in a flat bed without using bedrails?: A Little Help needed moving from lying on your back to sitting on the side of a flat bed without using bedrails?: A Lot Help needed moving to and from a bed to a chair (including a wheelchair)?: A Little Help needed standing up from a chair using your arms (e.g., wheelchair or bedside chair)?: A Little Help needed to walk in hospital room?: A Little Help needed climbing 3-5 steps with a railing? : A Little 6 Click Score: 17    End of Session Equipment Utilized During Treatment: Gait belt Activity Tolerance: Patient tolerated treatment well Patient left: in chair;with call bell/phone within reach Nurse Communication: Mobility  status PT Visit Diagnosis: Difficulty in walking, not elsewhere classified (R26.2);Other abnormalities of gait and mobility (R26.89)     Time: 9604-5409 PT Time Calculation (min) (ACUTE ONLY): 22 min  Charges:  $Gait Training: 8-22 mins                     Mauro Kaufmann PT Acute Rehabilitation Services Pager [phone] Office [phone]    Katlyn Muldrew 01/11/2020, 3:26 PM

## 2020-01-11 NOTE — Progress Notes (Signed)
Progress Note: General Surgery Service   Chief Complaint/Subjective: Up in chair. Reports nausea which is smoldering for last day. Has not taken much in way of PO. Denies flatus nor BM.  Objective: Vital signs in last 24 hours: Temp:  [98.1 F (36.7 C)-98.4 F (36.9 C)] 98.3 F (36.8 C) (09/11 0641) Pulse Rate:  [71-98] 98 (09/11 0641) Resp:  [16-18] 16 (09/11 0641) BP: (105-125)/(64-75) 123/71 (09/11 0641) SpO2:  [99 %-100 %] 100 % (09/11 0641) Last BM Date: 01/09/20  Intake/Output from previous day: 09/10 0701 - 09/11 0700 In: 720 [P.O.:720] Out: 142 [Urine:2; Drains:140] Intake/Output this shift: No intake/output data recorded.  Gen: NAD  Resp: nonlabored  Card: RRR  Abd: soft, incision c/d/i, drains with serosanguinous output  Lab Results: CBC  Recent Labs    01/09/20 0325  WBC 9.3  HGB 11.2*  HCT 33.8*  PLT 252   BMET Recent Labs    01/09/20 0325  NA 134*  K 3.6  CL 102  CO2 27  GLUCOSE 108*  BUN 14  CREATININE 0.85  CALCIUM 8.2*   PT/INR No results for input(s): LABPROT, INR in the last 72 hours. ABG No results for input(s): PHART, HCO3 in the last 72 hours.  Invalid input(s): PCO2, PO2  Anti-infectives: Anti-infectives (From admission, onward)   Start     Dose/Rate Route Frequency Ordered Stop   01/07/20 0600  ceFAZolin (ANCEF) IVPB 2g/100 mL premix        2 g 200 mL/hr over 30 Minutes Intravenous On call to O.R. 01/07/20 0535 01/07/20 0737   01/07/20 0545  ceFAZolin (ANCEF) 2-4 GM/100ML-% IVPB       Note to Pharmacy: Montel Clock   : cabinet override      01/07/20 0545 01/07/20 0732      Medications: Scheduled Meds: . dorzolamide-timolol  1 drop Both Eyes BID  . enoxaparin (LOVENOX) injection  40 mg Subcutaneous Q24H  . feeding supplement (ENSURE ENLIVE)  237 mL Oral BID BM  . fluticasone  1 spray Each Nare Daily  . gabapentin  300 mg Oral BID  . loratadine  10 mg Oral QPM  . losartan  100 mg Oral Daily  . pantoprazole   40 mg Oral Daily   Continuous Infusions: PRN Meds:.acetaminophen, albuterol, diphenhydrAMINE **OR** diphenhydrAMINE, HYDROcodone-acetaminophen, HYDROmorphone (DILAUDID) injection, ondansetron **OR** ondansetron (ZOFRAN) IV, polyethylene glycol, simethicone, traMADol  Assessment/Plan: s/p Procedure(s): LAPAROSCOPIC LYSIS OF ADHESIONS, OPEN LYSIS OF ADHESIONS, LAPAROSCOPIC CONVERTED TO OPEN INCISIONAL HERNIA REPAIR WITH MESH, SMALL BOWEL REPAIR 01/07/2020  -Sips/chips, start d5 1/2 ns with kcl -continue pain control -ileus not unexpected given operative procedure and findings; may take a few days to resolve -PPx: SQH, SCDs   LOS: 4 days   Marin Olp, M.D. [facility] Use [email or web address] to contact on call provider

## 2020-01-12 NOTE — Progress Notes (Signed)
Physical Therapy Treatment Patient Details Name: Rhonda [name] MRN: [id number] DOB: [birth year] Today's Date: 01/12/2020    History of Present Illness LAPAROSCOPIC LYSIS OF ADHESIONS, OPEN LYSIS OF ADHESIONS, LAPAROSCOPIC CONVERTED TO OPEN INCISIONAL HERNIA REPAIR WITH MESH, SMALL BOWEL REPAIR 01/07/2020 PMH: hernia repair, PLIF, remote patella fx    PT Comments    Pt continues very cooperative with slowly improving activity tolerance and decreased assist required for most tasks but pt continues to require increased time for all tasks and is largely limited by ongoing nausea - increased with movement.   Follow Up Recommendations  No PT follow up     Equipment Recommendations  Rolling walker with 5" wheels    Recommendations for Other Services       Precautions / Restrictions Precautions Precautions: Fall Required Braces or Orthoses: Other Brace Restrictions Weight Bearing Restrictions: No    Mobility  Bed Mobility Overal bed mobility: Needs Assistance Bed Mobility: Sidelying to Sit;Rolling Rolling: Min assist Sidelying to sit: Min assist       General bed mobility comments: Increased time with cues for log roll technique  Transfers Overall transfer level: Needs assistance Equipment used: Rolling walker (2 wheeled) Transfers: Sit to/from Stand Sit to Stand: Min guard         General transfer comment: min cues for use of UEs to self assist  Ambulation/Gait Ambulation/Gait assistance: Min guard Gait Distance (Feet): 450 Feet Assistive device: Rolling walker (2 wheeled) Gait Pattern/deviations: Step-through pattern;Decreased stride length Gait velocity: decr   General Gait Details: cues for posture and position from RW; steady assist only   Stairs             Wheelchair Mobility    Modified Rankin (Stroke Patients Only)       Balance Overall balance assessment: Needs assistance Sitting-balance support: Feet supported;No upper extremity  supported Sitting balance-Leahy Scale: Good     Standing balance support: Bilateral upper extremity supported Standing balance-Leahy Scale: Fair                              Cognition Arousal/Alertness: Awake/alert Behavior During Therapy: WFL for tasks assessed/performed Overall Cognitive Status: Within Functional Limits for tasks assessed                                        Exercises      General Comments        Pertinent Vitals/Pain Pain Assessment: 0-10 Pain Score: 4  Pain Location: abd with mobility Pain Descriptors / Indicators: Sore;Grimacing;Guarding Pain Intervention(s): Limited activity within patient's tolerance;Monitored during session;Premedicated before session;Ice applied    Home Living                      Prior Function            PT Goals (current goals can now be found in the care plan section) Acute Rehab PT Goals Patient Stated Goal: Regain IND PT Goal Formulation: With patient Time For Goal Achievement: 01/22/20 Potential to Achieve Goals: Good Progress towards PT goals: Progressing toward goals    Frequency    Min 3X/week      PT Plan Current plan remains appropriate    Co-evaluation              AM-PAC PT "6 Clicks" Mobility   Outcome Measure  Help needed turning from your back to your side while in a flat bed without using bedrails?: A Little Help needed moving from lying on your back to sitting on the side of a flat bed without using bedrails?: A Little Help needed moving to and from a bed to a chair (including a wheelchair)?: A Little Help needed standing up from a chair using your arms (e.g., wheelchair or bedside chair)?: A Little Help needed to walk in hospital room?: A Little Help needed climbing 3-5 steps with a railing? : A Little 6 Click Score: 18    End of Session Equipment Utilized During Treatment: Gait belt Activity Tolerance: Patient tolerated treatment  well Patient left: in chair;with call bell/phone within reach Nurse Communication: Mobility status PT Visit Diagnosis: Difficulty in walking, not elsewhere classified (R26.2);Other abnormalities of gait and mobility (R26.89)     Time: 8119-1478 PT Time Calculation (min) (ACUTE ONLY): 24 min  Charges:  $Gait Training: 23-37 mins                     Mauro Kaufmann PT Acute Rehabilitation Services Pager [phone] Office [phone]    Shaquaya Wuellner 01/12/2020, 1:26 PM

## 2020-01-12 NOTE — Plan of Care (Signed)
  Problem: Education: Goal: Knowledge of General Education information will improve Description: Including pain rating scale, medication(s)/side effects and non-pharmacologic comfort measures Outcome: Progressing   Problem: Nutrition: Goal: Adequate nutrition will be maintained Outcome: Progressing   Problem: Coping: Goal: Level of anxiety will decrease Outcome: Progressing   

## 2020-01-12 NOTE — Progress Notes (Signed)
Progress Note: General Surgery Service   Chief Complaint/Subjective: Up in chair. Diet de-escalated yesterday to sips of liquids. Emesis overnight x1. Reports flatus x2 and sensation like she may have bm soon.  Objective: Vital signs in last 24 hours: Temp:  [97.7 F (36.5 C)-98.3 F (36.8 C)] 98.1 F (36.7 C) (09/12 0517) Pulse Rate:  [86-97] 86 (09/12 0517) Resp:  [15-17] 15 (09/12 0517) BP: (110-130)/(61-83) 130/83 (09/12 0517) SpO2:  [97 %-99 %] 99 % (09/12 0517) Last BM Date: 01/09/20  Intake/Output from previous day: 09/11 0701 - 09/12 0700 In: 2381.7 [P.O.:480; I.V.:1901.7] Out: 139 [Drains:139] Intake/Output this shift: No intake/output data recorded.  Gen: NAD  Resp: nonlabored  Card: RRR  Abd: soft, incision c/d/i, drains with serosanguinous output; mildly distended  Lab Results: CBC  No results for input(s): WBC, HGB, HCT, PLT in the last 72 hours. BMET No results for input(s): NA, K, CL, CO2, GLUCOSE, BUN, CREATININE, CALCIUM in the last 72 hours. PT/INR No results for input(s): LABPROT, INR in the last 72 hours. ABG No results for input(s): PHART, HCO3 in the last 72 hours.  Invalid input(s): PCO2, PO2  Anti-infectives: Anti-infectives (From admission, onward)   Start     Dose/Rate Route Frequency Ordered Stop   01/07/20 0600  ceFAZolin (ANCEF) IVPB 2g/100 mL premix        2 g 200 mL/hr over 30 Minutes Intravenous On call to O.R. 01/07/20 0535 01/07/20 0737   01/07/20 0545  ceFAZolin (ANCEF) 2-4 GM/100ML-% IVPB       Note to Pharmacy: Montel Clock   : cabinet override      01/07/20 0545 01/07/20 0732      Medications: Scheduled Meds: . dorzolamide-timolol  1 drop Both Eyes BID  . enoxaparin (LOVENOX) injection  40 mg Subcutaneous Q24H  . feeding supplement (ENSURE ENLIVE)  237 mL Oral BID BM  . fluticasone  1 spray Each Nare Daily  . gabapentin  300 mg Oral BID  . loratadine  10 mg Oral QPM  . losartan  100 mg Oral Daily  .  pantoprazole  40 mg Oral Daily   Continuous Infusions: . dextrose 5 % and 0.45 % NaCl with KCl 20 mEq/L 100 mL/hr at 01/11/20 2135   PRN Meds:.acetaminophen, albuterol, diphenhydrAMINE **OR** diphenhydrAMINE, HYDROcodone-acetaminophen, HYDROmorphone (DILAUDID) injection, ondansetron **OR** ondansetron (ZOFRAN) IV, polyethylene glycol, simethicone, traMADol  Assessment/Plan: s/p Procedure(s): LAPAROSCOPIC LYSIS OF ADHESIONS, OPEN LYSIS OF ADHESIONS, LAPAROSCOPIC CONVERTED TO OPEN INCISIONAL HERNIA REPAIR WITH MESH, SMALL BOWEL REPAIR 01/07/2020  -Clear liquids today; cont d5 1/2 ns with kcl -continue pain control -ileus not unexpected given operative procedure and findings - appears clinically to be slowly resolving now -PPx: SQH, SCDs   LOS: 5 days   Marin Olp, M.D. [facility] Use [email or web address] to contact on call provider

## 2020-01-13 LAB — BASIC METABOLIC PANEL
Anion gap: 9 (ref 5–15)
BUN: 18 mg/dL (ref 8–23)
CO2: 27 mmol/L (ref 22–32)
Calcium: 8.8 mg/dL — ABNORMAL LOW (ref 8.9–10.3)
Chloride: 98 mmol/L (ref 98–111)
Creatinine, Ser: 0.83 mg/dL (ref 0.44–1.00)
GFR calc Af Amer: >60 mL/min (ref >60)
GFR calc non Af Amer: >60 mL/min (ref >60)
Glucose, Bld: 109 mg/dL — ABNORMAL HIGH (ref 70–99)
Potassium: 4 mmol/L (ref 3.5–5.1)
Sodium: 134 mmol/L — ABNORMAL LOW (ref 135–145)

## 2020-01-13 NOTE — Plan of Care (Signed)
  Problem: Education: Goal: Knowledge of General Education information will improve Description Including pain rating scale, medication(s)/side effects and non-pharmacologic comfort measures Outcome: Progressing   Problem: Clinical Measurements: Goal: Ability to maintain clinical measurements within normal limits will improve Outcome: Progressing   Problem: Activity: Goal: Risk for activity intolerance will decrease Outcome: Progressing   

## 2020-01-13 NOTE — Care Management Important Message (Signed)
Important Message  Patient Details IM Letter given to the Patient Name: Rhonda [name] MRN: [id number] Date of Birth: 1950-08-28   Medicare Important Message Given:  Yes     Caren Macadam 01/13/2020, 12:21 PM

## 2020-01-13 NOTE — Progress Notes (Signed)
Physical Therapy Treatment Patient Details Name: Rhonda [name] MRN: [id number] DOB: [birth year] Today's Date: 01/13/2020    History of Present Illness LAPAROSCOPIC LYSIS OF ADHESIONS, OPEN LYSIS OF ADHESIONS, LAPAROSCOPIC CONVERTED TO OPEN INCISIONAL HERNIA REPAIR WITH MESH, SMALL BOWEL REPAIR 01/07/2020 PMH: hernia repair, PLIF, remote patella fx    PT Comments    More nauseous and tired but willing  to work with PT. amb hallway distance, increasing independence overall.  Follow Up Recommendations  No PT follow up     Equipment Recommendations  Rolling walker with 5" wheels    Recommendations for Other Services       Precautions / Restrictions Precautions Precautions: Fall Restrictions Weight Bearing Restrictions: No    Mobility  Bed Mobility Overal bed mobility: Needs Assistance Bed Mobility: Rolling;Sidelying to Sit Rolling: Min guard Sidelying to sit: Min assist       General bed mobility comments: Increased time with cues for log roll technique  Transfers Overall transfer level: Needs assistance Equipment used: Rolling walker (2 wheeled) Transfers: Sit to/from Stand Sit to Stand: Min guard         General transfer comment: min cues for use of UEs to self assist  Ambulation/Gait Ambulation/Gait assistance: Min guard Gait Distance (Feet): 120 Feet Assistive device: Rolling walker (2 wheeled) Gait Pattern/deviations: Step-through pattern;Decreased stride length Gait velocity: decr   General Gait Details: cues for posture and position from RW; steadying assist only   Stairs             Wheelchair Mobility    Modified Rankin (Stroke Patients Only)       Balance   Sitting-balance support: Feet supported;No upper extremity supported Sitting balance-Leahy Scale: Good     Standing balance support: Bilateral upper extremity supported Standing balance-Leahy Scale: Fair Standing balance comment: reliant on UEs for dynamic                             Cognition Arousal/Alertness: Awake/alert Behavior During Therapy: WFL for tasks assessed/performed Overall Cognitive Status: Within Functional Limits for tasks assessed                                        Exercises      General Comments        Pertinent Vitals/Pain Pain Assessment: Faces Faces Pain Scale: Hurts a little bit Pain Location: abd with mobility Pain Descriptors / Indicators: Sore;Grimacing;Guarding Pain Intervention(s): Limited activity within patient's tolerance;Monitored during session    Home Living                      Prior Function            PT Goals (current goals can now be found in the care plan section) Acute Rehab PT Goals Patient Stated Goal: Regain IND PT Goal Formulation: With patient Time For Goal Achievement: 01/22/20 Potential to Achieve Goals: Good Progress towards PT goals: Progressing toward goals    Frequency    Min 3X/week      PT Plan Current plan remains appropriate    Co-evaluation              AM-PAC PT "6 Clicks" Mobility   Outcome Measure  Help needed turning from your back to your side while in a flat bed without using bedrails?: A Little Help needed moving from  lying on your back to sitting on the side of a flat bed without using bedrails?: A Little Help needed moving to and from a bed to a chair (including a wheelchair)?: A Little Help needed standing up from a chair using your arms (e.g., wheelchair or bedside chair)?: A Little Help needed to walk in hospital room?: A Little Help needed climbing 3-5 steps with a railing? : A Little 6 Click Score: 18    End of Session Equipment Utilized During Treatment: Gait belt Activity Tolerance: Patient tolerated treatment well Patient left: with call bell/phone within reach;Other (comment) (in bathroom, RN aware ) Nurse Communication: Mobility status PT Visit Diagnosis: Difficulty in walking, not elsewhere  classified (R26.2);Other abnormalities of gait and mobility (R26.89)     Time: 4825-0037 PT Time Calculation (min) (ACUTE ONLY): 16 min  Charges:  $Gait Training: 8-22 mins                      Delice Bison, PT  Acute Rehab Dept ([facility]) [phone] Pager [phone]  01/13/2020    [facility] 01/13/2020, 1:32 PM

## 2020-01-13 NOTE — Progress Notes (Signed)
Progress Note: General Surgery Service   Chief Complaint/Subjective: Tolerating liquids, +BM overnight, eructations, ambulating with assist  Objective: Vital signs in last 24 hours: Temp:  [97.9 F (36.6 C)-98.3 F (36.8 C)] 97.9 F (36.6 C) (09/13 0506) Pulse Rate:  [75-92] 75 (09/13 0506) Resp:  [16-18] 16 (09/13 0506) BP: (116-136)/(62-74) 116/62 (09/13 0506) SpO2:  [98 %-100 %] 100 % (09/13 0506) Last BM Date: 01/11/20  Intake/Output from previous day: 09/12 0701 - 09/13 0700 In: 618.3 [P.O.:120; I.V.:498.3] Out: 80 [Drains:80] Intake/Output this shift: No intake/output data recorded.  Gen: NAd  Resp: nonlabored  Card: RRR  Abd: soft, Nt, ND, incision c/d/i, drains with minimal serous output  Lab Results: CBC  No results for input(s): WBC, HGB, HCT, PLT in the last 72 hours. BMET Recent Labs    01/13/20 0324  NA 134*  K 4.0  CL 98  CO2 27  GLUCOSE 109*  BUN 18  CREATININE 0.83  CALCIUM 8.8*   PT/INR No results for input(s): LABPROT, INR in the last 72 hours. ABG No results for input(s): PHART, HCO3 in the last 72 hours.  Invalid input(s): PCO2, PO2  Anti-infectives: Anti-infectives (From admission, onward)   Start     Dose/Rate Route Frequency Ordered Stop   01/07/20 0600  ceFAZolin (ANCEF) IVPB 2g/100 mL premix        2 g 200 mL/hr over 30 Minutes Intravenous On call to O.R. 01/07/20 0535 01/07/20 0737   01/07/20 0545  ceFAZolin (ANCEF) 2-4 GM/100ML-% IVPB       Note to Pharmacy: Montel Clock   : cabinet override      01/07/20 0545 01/07/20 0732      Medications: Scheduled Meds: . dorzolamide-timolol  1 drop Both Eyes BID  . enoxaparin (LOVENOX) injection  40 mg Subcutaneous Q24H  . feeding supplement (ENSURE ENLIVE)  237 mL Oral BID BM  . fluticasone  1 spray Each Nare Daily  . gabapentin  300 mg Oral BID  . loratadine  10 mg Oral QPM  . losartan  100 mg Oral Daily  . pantoprazole  40 mg Oral Daily   Continuous Infusions: .  dextrose 5 % and 0.45 % NaCl with KCl 20 mEq/L 100 mL/hr at 01/13/20 0054   PRN Meds:.acetaminophen, albuterol, diphenhydrAMINE **OR** diphenhydrAMINE, HYDROcodone-acetaminophen, HYDROmorphone (DILAUDID) injection, ondansetron **OR** ondansetron (ZOFRAN) IV, polyethylene glycol, simethicone, traMADol  Assessment/Plan: s/p Procedure(s): LAPAROSCOPIC LYSIS OF ADHESIONS, OPEN LYSIS OF ADHESIONS, LAPAROSCOPIC CONVERTED TO OPEN INCISIONAL HERNIA REPAIR WITH MESH, SMALL BOWEL REPAIR 01/07/2020  -full liquids -remove right sided drain   LOS: 6 days   Rodman Pickle, MD [phone] [facility]

## 2020-01-14 LAB — CREATININE, SERUM
Creatinine, Ser: 0.7 mg/dL (ref 0.44–1.00)
GFR calc Af Amer: >60 mL/min (ref >60)
GFR calc non Af Amer: >60 mL/min (ref >60)

## 2020-01-14 NOTE — Discharge Summary (Signed)
Physician Discharge Summary  Patient ID: Rhonda [name] MRN: [id number] DOB/AGE: 05/10/1950 69 y.o.  Admit date: 01/07/2020 Discharge date: 01/14/2020  Admission Diagnoses:  Discharge Diagnoses:  Active Problems:   Incisional hernia   Discharged Condition: good  Hospital Course: 69 yo female presented for hernia repair. Post op she was admitted to the surgical floor. She had pain issues the first day. She slowly advanced diet but POD 4 had multiple episodes of vomiting. POD 5 she had return of bowel function and diet was advanced. She worked with PT and slowly improved mobility. She was discharged home POD 7.  Consults: None  Significant Diagnostic Studies: none  Treatments: IV hydration and surgery: incisional hernia repair  Discharge Exam: Blood pressure (!) 123/57, pulse 76, temperature 98 F (36.7 C), temperature source Oral, resp. rate 16, height 5\' 3"  (1.6 m), weight 103.4 kg, SpO2 100 %. General appearance: alert and cooperative Head: Normocephalic, without obvious abnormality, atraumatic Resp: clear to auscultation bilaterally GI: soft, non-tender; bowel sounds normal; no masses,  no organomegaly and incision c/d/i, left drain in place with serous output  Disposition: Discharge disposition: 01-Home or Self Care       Discharge Instructions    Call MD for:  difficulty breathing, headache or visual disturbances   Complete by: As directed    Call MD for:  hives   Complete by: As directed    Call MD for:  persistant nausea and vomiting   Complete by: As directed    Call MD for:  redness, tenderness, or signs of infection (pain, swelling, redness, odor or green/yellow discharge around incision site)   Complete by: As directed    Call MD for:  severe uncontrolled pain   Complete by: As directed    Call MD for:  temperature >100.4   Complete by: As directed    Diet - low sodium heart healthy   Complete by: As directed    Discharge wound care:   Complete by: As  directed    Ok to shower tomorrow. No bandage required   Driving Restrictions   Complete by: As directed    No driving while on narcotics   Increase activity slowly   Complete by: As directed    Lifting restrictions   Complete by: As directed    No lifting greater than 20 pounds for 3 weeks     Allergies as of 01/14/2020      Reactions   Aspirin-acetaminophen-caffeine Nausea And Vomiting   Codeine Diarrhea, Nausea And Vomiting   Stomach cramps   Oxycontin [oxycodone Hcl] Other (See Comments)   Hallucination      Medication List    TAKE these medications   acetaminophen 650 MG CR tablet Commonly known as: TYLENOL Take 1,300 mg by mouth at bedtime as needed for pain.   albuterol 108 (90 Base) MCG/ACT inhaler Commonly known as: ProAir HFA Inhale 2 puffs into the lungs every 6 (six) hours as needed for wheezing or shortness of breath.   atorvastatin 10 MG tablet Commonly known as: LIPITOR Take 1 tablet by mouth once daily   Dialyvite Vitamin D 5000 125 MCG (5000 UT) capsule Generic drug: Cholecalciferol Take 5,000 Units by mouth daily.   dorzolamide-timolol 22.3-6.8 MG/ML ophthalmic solution Commonly known as: COSOPT Place 1 drop into both eyes 2 (two) times daily.   esomeprazole 40 MG capsule Commonly known as: NEXIUM Take 1 capsule (40 mg total) by mouth daily at 12 noon.   fluticasone 50 MCG/ACT nasal spray  Commonly known as: FLONASE Use 1 spray(s) in each nostril once daily What changed: See the new instructions.   hydrochlorothiazide 25 MG tablet Commonly known as: HYDRODIURIL Take 25 mg by mouth daily.   HYDROcodone-acetaminophen 5-325 MG tablet Commonly known as: NORCO/VICODIN Take 1 tablet by mouth every 6 (six) hours as needed for moderate pain.   ibuprofen 600 MG tablet Commonly known as: ADVIL Take 1 tablet (600 mg total) by mouth every 6 (six) hours as needed.   levocetirizine 5 MG tablet Commonly known as: XYZAL TAKE 1 TABLET BY MOUTH ONCE  DAILY IN THE EVENING   losartan 100 MG tablet Commonly known as: COZAAR Take 1 tablet by mouth once daily   multivitamin with minerals Tabs tablet Take 1 tablet by mouth daily.   ondansetron 4 MG disintegrating tablet Commonly known as: ZOFRAN-ODT Take 4 mg by mouth every 6 (six) hours as needed for nausea or vomiting.   polyethylene glycol 17 g packet Commonly known as: MIRALAX / GLYCOLAX Take 17 g by mouth daily as needed for mild constipation.   potassium chloride 10 MEQ tablet Commonly known as: KLOR-CON Take 1 tablet by mouth once daily   Probiotic Caps Take 1 capsule by mouth daily.            Discharge Care Instructions  (From admission, onward)         Start     Ordered   01/10/20 0000  Discharge wound care:       Comments: Ok to shower tomorrow. No bandage required   01/10/20 1659           Signed: De Blanch Ynez Eugenio 01/14/2020, 2:29 PM

## 2020-01-14 NOTE — Progress Notes (Signed)
Progress Note: General Surgery Service   Chief Complaint/Subjective: Tolerating liquids, +BM yesterday, eructations less than yesterday, feels better than yesterday  Objective: Vital signs in last 24 hours: Temp:  [97.7 F (36.5 C)-97.8 F (36.6 C)] 97.8 F (36.6 C) (09/14 0530) Pulse Rate:  [74-89] 74 (09/14 0530) Resp:  [17-20] 17 (09/14 0530) BP: (102-116)/(46-67) 102/46 (09/14 0530) SpO2:  [98 %-99 %] 98 % (09/14 0530) Last BM Date: 01/13/20  Intake/Output from previous day: 09/13 0701 - 09/14 0700 In: 3145.3 [P.O.:837; I.V.:2308.3] Out: 55 [Drains:55] Intake/Output this shift: No intake/output data recorded.  Gen: NAd  Resp: nonlabored  Card: RRR  Abd: soft, NT, ND incision c/d/i, drain with minimal serous draiange  Lab Results: CBC  No results for input(s): WBC, HGB, HCT, PLT in the last 72 hours. BMET Recent Labs    01/13/20 0324 01/14/20 0318  NA 134*  --   K 4.0  --   CL 98  --   CO2 27  --   GLUCOSE 109*  --   BUN 18  --   CREATININE 0.83 0.70  CALCIUM 8.8*  --    PT/INR No results for input(s): LABPROT, INR in the last 72 hours. ABG No results for input(s): PHART, HCO3 in the last 72 hours.  Invalid input(s): PCO2, PO2  Anti-infectives: Anti-infectives (From admission, onward)   Start     Dose/Rate Route Frequency Ordered Stop   01/07/20 0600  ceFAZolin (ANCEF) IVPB 2g/100 mL premix        2 g 200 mL/hr over 30 Minutes Intravenous On call to O.R. 01/07/20 0535 01/07/20 0737   01/07/20 0545  ceFAZolin (ANCEF) 2-4 GM/100ML-% IVPB       Note to Pharmacy: Montel Clock   : cabinet override      01/07/20 0545 01/07/20 0732      Medications: Scheduled Meds: . dorzolamide-timolol  1 drop Both Eyes BID  . enoxaparin (LOVENOX) injection  40 mg Subcutaneous Q24H  . feeding supplement (ENSURE ENLIVE)  237 mL Oral BID BM  . fluticasone  1 spray Each Nare Daily  . gabapentin  300 mg Oral BID  . loratadine  10 mg Oral QPM  . losartan  100  mg Oral Daily  . pantoprazole  40 mg Oral Daily   Continuous Infusions: . dextrose 5 % and 0.45 % NaCl with KCl 20 mEq/L 100 mL/hr at 01/13/20 1302   PRN Meds:.acetaminophen, albuterol, diphenhydrAMINE **OR** diphenhydrAMINE, HYDROcodone-acetaminophen, HYDROmorphone (DILAUDID) injection, ondansetron **OR** ondansetron (ZOFRAN) IV, polyethylene glycol, simethicone, traMADol  Assessment/Plan: s/p Procedure(s): LAPAROSCOPIC LYSIS OF ADHESIONS, OPEN LYSIS OF ADHESIONS, LAPAROSCOPIC CONVERTED TO OPEN INCISIONAL HERNIA REPAIR WITH MESH, SMALL BOWEL REPAIR 01/07/2020  Advance diet Potentially home this afternoon with drain   LOS: 7 days   Rodman Pickle, MD [phone] [facility]

## 2020-01-14 NOTE — TOC Transition Note (Signed)
Transition of Care [facility]) - CM/SW Discharge Note   Patient Details  Name: Rhonda [name] MRN: [id number] Date of Birth: [birth year]  [facility] CM/SW Contact:  Lennart Pall, LCSW Phone Number: 01/14/2020, 10:19 AM   Clinical Narrative:   Met with pt to review dc needs.  Pt hopeful will be dc today.  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  Denies any concerns about dc. She does need rolling walker -ordered from [employer].  No further TOC needs noted - will monitor until dc.    Final next level of care: Home/Self Care Barriers to Discharge: Barriers Resolved   Patient Goals and CMS Choice Patient states their goals for this hospitalization and ongoing recovery are:: return home      Discharge Placement                       Discharge Plan and Services                DME Arranged: Walker rolling DME Agency: [employer] Date DME Agency Contacted: 01/14/20 Time DME Agency Contacted: 27   HH Arranged: [place]: NA        Social Determinants of Health ([place]) Interventions     Readmission Risk Interventions Readmission Risk Prevention Plan 01/14/2020  Transportation Screening Complete  PCP or Specialist Appt within 5-7 Days Complete  Home Care Screening Complete  Some recent data might be hidden

## 2020-01-14 NOTE — Progress Notes (Signed)
D/C instructions given to patient and daughter. No questions. NT or writer will wheel patient out once she is dressed

## 2020-01-16 ENCOUNTER — Encounter: Payer: Medicare Other | Admitting: Nurse Practitioner

## 2020-01-16 ENCOUNTER — Telehealth: Payer: Self-pay

## 2020-01-16 ENCOUNTER — Encounter: Payer: Medicare Other | Admitting: Internal Medicine

## 2020-01-16 ENCOUNTER — Ambulatory Visit: Payer: Medicare Other

## 2020-01-16 NOTE — Telephone Encounter (Signed)
Transition Care Management Follow-up Telephone Call  Date of discharge and from where: 01/14/20  How have you been since you were released from the hospital? Sore and a little pain   Any questions or concerns? [name] you had her start her on a probiotic OTC she stopped taking before she went into the hospital should she start back taking them?  Items Reviewed:  Did the pt receive and understand the discharge instructions provided?yes  Medications obtained and verified? Yes and yes  Any new allergies since your discharge? No  Dietary orders reviewed? yes  Do you have support at home? yes  Other (ie: DME, Home Health, etc) No   Functional Questionnaire: (I = Independent and D = Dependent)  Bathing/Dressing- D daughter at home to help   Meal Prep- D daughter at home to help  Eating- I  Maintaining continence- I  Transferring/Ambulation- I with walker  Managing Meds- I   Follow up appointments reviewed:    PCP Hospital f/u appt confirmed?Yes  Specialist Hospital f/u appt confirmed? Yes  Are transportation arrangements needed? No  If their condition worsens, is the pt aware to call  their PCP or go to the ED? Yes  Was the patient provided with contact information for the PCP's office or ED? Yes  Was the pt encouraged to call back with questions or concerns? Yes

## 2020-01-20 ENCOUNTER — Telehealth: Payer: Self-pay

## 2020-01-20 NOTE — Telephone Encounter (Signed)
LVM for pt to call the office for message from provider   Per JM: If she is still having the GI upset or difficulty with bowel movements to continue the probiotic. She needs to make sure she eats with her ibuprofen.

## 2020-01-22 ENCOUNTER — Other Ambulatory Visit: Payer: Self-pay | Admitting: Internal Medicine

## 2020-01-28 ENCOUNTER — Other Ambulatory Visit: Payer: Self-pay | Admitting: Internal Medicine

## 2020-02-11 ENCOUNTER — Other Ambulatory Visit: Payer: Self-pay | Admitting: Nurse Practitioner

## 2020-02-19 ENCOUNTER — Other Ambulatory Visit: Payer: Self-pay

## 2020-02-19 ENCOUNTER — Ambulatory Visit (INDEPENDENT_AMBULATORY_CARE_PROVIDER_SITE_OTHER): Payer: Medicare Other | Admitting: Nurse Practitioner

## 2020-02-19 ENCOUNTER — Ambulatory Visit (INDEPENDENT_AMBULATORY_CARE_PROVIDER_SITE_OTHER): Payer: Medicare Other

## 2020-02-19 ENCOUNTER — Encounter: Payer: Self-pay | Admitting: Nurse Practitioner

## 2020-02-19 VITALS — BP 130/74 | HR 77 | Temp 98.1°F | Ht 62.8 in | Wt 227.0 lb

## 2020-02-19 DIAGNOSIS — I10 Essential (primary) hypertension: Secondary | ICD-10-CM | POA: Diagnosis not present

## 2020-02-19 DIAGNOSIS — R5383 Other fatigue: Secondary | ICD-10-CM

## 2020-02-19 DIAGNOSIS — E785 Hyperlipidemia, unspecified: Secondary | ICD-10-CM

## 2020-02-19 DIAGNOSIS — Z23 Encounter for immunization: Secondary | ICD-10-CM

## 2020-02-19 DIAGNOSIS — Z0000 Encounter for general adult medical examination without abnormal findings: Secondary | ICD-10-CM | POA: Diagnosis not present

## 2020-02-19 DIAGNOSIS — R7303 Prediabetes: Secondary | ICD-10-CM

## 2020-02-19 DIAGNOSIS — Z8719 Personal history of other diseases of the digestive system: Secondary | ICD-10-CM

## 2020-02-19 MED ORDER — BOOSTRIX 5-2.5-18.5 LF-MCG/0.5 IM SUSP: 0.5000 mL | Freq: Once | INTRAMUSCULAR | 0 refills | Status: AC

## 2020-02-19 MED ORDER — PREVNAR 13 IM SUSP: 0.5000 mL | INTRAMUSCULAR | 0 refills | Status: AC
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
## 2020-02-19 NOTE — Progress Notes (Signed)
I,Yamilka Roman [employer] as a Education administrator for [employer], FNP.,have documented all relevant documentation on the behalf of [name], FNP,as directed by  [name], FNP while in the presence of [name], [place]. This visit occurred during the SARS-CoV-2 public health emergency.  Safety protocols were in place, including screening questions prior to the visit, additional usage of staff PPE, and extensive cleaning of exam room while observing appropriate contact time as indicated for disinfecting solutions.  Subjective:     Patient ID: Rhonda [name] , female    DOB: 1951/01/21 , 69 y.o.   MRN: [id number]   Chief Complaint  Patient presents with  . Hypertension    HPI   She had a reconstruction abdomen surgery.  One week ago on Friday she had some skin that came out.  She had come home with one drain after 4 weeks it stopped working, she was seen the next day and was advised it was to stop working when better and was subsequently removed.  Wt Readings from Last 3 Encounters: 02/19/20 : 227 lb (103 kg) 01/07/20 : 228 lb (103.4 kg) 12/31/19 : 228 lb (103.4 kg)  She feels like her energy is low.  She is also here today for hypertension follow up  Hypertension This is a chronic problem. The current episode started more than 1 year ago. Pertinent negatives include no chest pain, headaches or palpitations. Risk factors for coronary artery disease include obesity and sedentary lifestyle. Past treatments include angiotensin blockers. Compliance problems include exercise.  There is no history of angina.     Past Medical History:  Diagnosis Date  . Anemia    hx none since menopause  . Anginal pain ([place])    due to chest wall from cough  . Arthritis    "maybe in my legs" (06/15/2016)  . Asthma 2018   none since  . Complication of anesthesia    woke up during colonoscopy 03-31-16 Dr. Thornton Park  . GERD (gastroesophageal reflux disease)   . Heart murmur 2018  . History of hiatal  hernia   . Hyperlipemia   . Hypertension   . Pre-diabetes 2019  . Sinus congestion    "chronic" (06/15/2016)  . Sinus headache    "a few/month" (06/15/2016)  . Thyroid nodule 2010   "no problems w/them since; on a pill for a while" (06/15/2016)     Family History  Problem Relation Age of Onset  . COPD Father   . Breast cancer Neg Hx      Current Outpatient Medications:  .  acetaminophen (TYLENOL) 650 MG CR tablet, Take 1,300 mg by mouth at bedtime as needed for pain. , Disp: , Rfl:  .  Cholecalciferol (DIALYVITE VITAMIN D 5000) 125 MCG (5000 UT) capsule, Take 5,000 Units by mouth daily., Disp: , Rfl:  .  dorzolamide-timolol (COSOPT) 22.3-6.8 MG/ML ophthalmic solution, Place 1 drop into both eyes 2 (two) times daily. , Disp: , Rfl:  .  esomeprazole (NEXIUM) 40 MG capsule, Take 1 capsule (40 mg total) by mouth daily at 12 noon., Disp: 30 capsule, Rfl: 0 .  fluticasone (FLONASE) 50 MCG/ACT nasal spray, Use 1 spray(s) in each nostril once daily (Patient taking differently: Place 1 spray into both nostrils daily. ), Disp: 16 g, Rfl: 1 .  ibuprofen (ADVIL) 600 MG tablet, Take 1 tablet (600 mg total) by mouth every 6 (six) hours as needed., Disp: 30 tablet, Rfl: 0 .  levocetirizine (XYZAL) 5 MG tablet, TAKE 1 TABLET BY MOUTH  ONCE DAILY IN THE EVENING (Patient taking differently: Take 5 mg by mouth every evening. ), Disp: 30 tablet, Rfl: 0 .  losartan (COZAAR) 100 MG tablet, Take 1 tablet by mouth once daily, Disp: 30 tablet, Rfl: 0 .  Multiple Vitamin (MULTIVITAMIN WITH MINERALS) TABS tablet, Take 1 tablet by mouth daily., Disp: , Rfl:  .  ondansetron (ZOFRAN-ODT) 4 MG disintegrating tablet, Take 4 mg by mouth every 6 (six) hours as needed for nausea or vomiting., Disp: , Rfl:  .  polyethylene glycol (MIRALAX / GLYCOLAX) 17 g packet, Take 17 g by mouth daily as needed for mild constipation., Disp: 14 each, Rfl: 0 .  Probiotic CAPS, Take 1 capsule by mouth daily., Disp: , Rfl:  .  albuterol  (PROAIR HFA) 108 (90 Base) MCG/ACT inhaler, Inhale 2 puffs into the lungs every 6 (six) hours as needed for wheezing or shortness of breath. (Patient not taking: Reported on 02/19/2020), Disp: 1 Inhaler, Rfl: 5 .  atorvastatin (LIPITOR) 10 MG tablet, Take 1 tablet by mouth once daily, Disp: 90 tablet, Rfl: 0 .  hydrochlorothiazide (HYDRODIURIL) 25 MG tablet, Take 1 tablet by mouth once daily, Disp: 90 tablet, Rfl: 0 .  HYDROcodone-acetaminophen (NORCO/VICODIN) 5-325 MG tablet, Take 1 tablet by mouth every 6 (six) hours as needed for moderate pain. (Patient not taking: Reported on 02/19/2020), Disp: 20 tablet, Rfl: 0 .  potassium chloride (KLOR-CON) 10 MEQ tablet, Take 1 tablet by mouth once daily, Disp: 30 tablet, Rfl: 0   Allergies  Allergen Reactions  . Aspirin-Acetaminophen-Caffeine Nausea And Vomiting  . Codeine Diarrhea and Nausea And Vomiting    Stomach cramps   . Oxycontin [Oxycodone Hcl] Other (See Comments)    Hallucination      Review of Systems  Constitutional: Positive for fatigue.  Respiratory: Negative.   Cardiovascular: Negative.  Negative for chest pain, palpitations and leg swelling.  Gastrointestinal: Negative for abdominal distention, constipation, diarrhea, nausea and vomiting.  Neurological: Negative for dizziness and headaches.  Psychiatric/Behavioral: Negative.      Today's Vitals   02/19/20 1520  BP: 130/74  Pulse: 77  Temp: 98.1 F (36.7 C)  TempSrc: Oral  Weight: 227 lb (103 kg)  Height: 5' 2.8" (1.595 m)  PainSc: 0-No pain   Body mass index is 40.47 kg/m.   Objective:  Physical Exam Vitals reviewed.  Constitutional:      General: She is not in acute distress.    Appearance: Normal appearance. She is obese.  Cardiovascular:     Rate and Rhythm: Normal rate and regular rhythm.     Pulses: Normal pulses.     Heart sounds: Normal heart sounds. No murmur heard.   Pulmonary:     Effort: Pulmonary effort is normal. No respiratory distress.      Breath sounds: Normal breath sounds. No wheezing.  Abdominal:     General: Bowel sounds are normal. There is no distension (rounded).     Palpations: Abdomen is soft.     Tenderness: There is no abdominal tenderness.  Neurological:     General: No focal deficit present.     Mental Status: She is alert and oriented to person, place, and time.     Cranial Nerves: No cranial nerve deficit.     Motor: No weakness.  Psychiatric:        Mood and Affect: Mood normal.        Behavior: Behavior normal.        Thought Content: Thought content normal.  Judgment: Judgment normal.         Assessment And Plan:     1. Essential hypertension  Chronic, blood pressure is controlled  No changes to medications - CMP14+EGFR  2. Hyperlipidemia, unspecified hyperlipidemia type  Chronic, controlled  Continue with current medications, tolerating well  3. Other fatigue  This could be related to deconditioning as she has had several hospital visit vs metabolic cause - Vitamin E30 - TSH - CBC  4. Prediabetes  Chronic, controlled  Continue with current medications  Encouraged to limit intake of sugary foods and drinks  Encouraged to increase physical activity to 150 minutes per week as tolerated - Hemoglobin A1c - CMP14+EGFR  5. History of small bowel obstruction  6. Need for influenza vaccination  Influenza vaccine administered  Encouraged to take Tylenol as needed for fever or muscle aches. - Flu Vaccine QUAD High Dose(Fluad)     Patient was given opportunity to ask questions. Patient verbalized understanding of the plan and was able to repeat key elements of the plan. All questions were answered to their satisfaction.  [name], FNP   I, [name], FNP, have reviewed all documentation for this visit. The documentation on 04/30/20 for the exam, diagnosis, procedures, and orders are all accurate and complete.  THE PATIENT IS ENCOURAGED TO PRACTICE SOCIAL  DISTANCING DUE TO THE COVID-19 PANDEMIC.

## 2020-02-19 NOTE — Patient Instructions (Signed)
Rhonda [name] , Thank you for taking time to come for your Medicare Wellness Visit. I appreciate your ongoing commitment to your health goals. Please review the following plan we discussed and let me know if I can assist you in the future.   Screening recommendations/referrals: Colonoscopy: completed 02/16/2017 Mammogram: completed 05/2019 per patient Bone Density: completed 04/16/2019 Recommended yearly ophthalmology/optometry visit for glaucoma screening and checkup Recommended yearly dental visit for hygiene and checkup  Vaccinations: Influenza vaccine: today Pneumococcal vaccine: sent to pharmacy Tdap vaccine: sent to pharmacy Shingles vaccine: discussed   Covid-19: 06/29/2019, 06/08/2019  Advanced directives: Advance directive discussed with you today. Even though you declined this today please call our office should you change your mind and we can give you the proper paperwork for you to fill out.  Conditions/risks identified: none  Next appointment: Follow up in one year for your annual wellness visit    Preventive Care 65 Years and Older, Female Preventive care refers to lifestyle choices and visits with your health care provider that can promote health and wellness. What does preventive care include?  A yearly physical exam. This is also called an annual well check.  Dental exams once or twice a year.  Routine eye exams. Ask your health care provider how often you should have your eyes checked.  Personal lifestyle choices, including:  Daily care of your teeth and gums.  Regular physical activity.  Eating a healthy diet.  Avoiding tobacco and drug use.  Limiting alcohol use.  Practicing safe sex.  Taking low-dose aspirin every day.  Taking vitamin and mineral supplements as recommended by your health care provider. What happens during an annual well check? The services and screenings done by your health care provider during your annual well check will depend on  your age, overall health, lifestyle risk factors, and family history of disease. Counseling  Your health care provider may ask you questions about your:  Alcohol use.  Tobacco use.  Drug use.  Emotional well-being.  Home and relationship well-being.  Sexual activity.  Eating habits.  History of falls.  Memory and ability to understand (cognition).  Work and work Astronomer.  Reproductive health. Screening  You may have the following tests or measurements:  Height, weight, and BMI.  Blood pressure.  Lipid and cholesterol levels. These may be checked every 5 years, or more frequently if you are over 51 years old.  Skin check.  Lung cancer screening. You may have this screening every year starting at age 76 if you have a 30-pack-year history of smoking and currently smoke or have quit within the past 15 years.  Fecal occult blood test (FOBT) of the stool. You may have this test every year starting at age 72.  Flexible sigmoidoscopy or colonoscopy. You may have a sigmoidoscopy every 5 years or a colonoscopy every 10 years starting at age 39.  Hepatitis C blood test.  Hepatitis B blood test.  Sexually transmitted disease (STD) testing.  Diabetes screening. This is done by checking your blood sugar (glucose) after you have not eaten for a while (fasting). You may have this done every 1-3 years.  Bone density scan. This is done to screen for osteoporosis. You may have this done starting at age 87.  Mammogram. This may be done every 1-2 years. Talk to your health care provider about how often you should have regular mammograms. Talk with your health care provider about your test results, treatment options, and if necessary, the need for more tests.  Vaccines  Your health care provider may recommend certain vaccines, such as:  Influenza vaccine. This is recommended every year.  Tetanus, diphtheria, and acellular pertussis (Tdap, Td) vaccine. You may need a Td booster  every 10 years.  Zoster vaccine. You may need this after age 45.  Pneumococcal 13-valent conjugate (PCV13) vaccine. One dose is recommended after age 84.  Pneumococcal polysaccharide (PPSV23) vaccine. One dose is recommended after age 36. Talk to your health care provider about which screenings and vaccines you need and how often you need them. This information is not intended to replace advice given to you by your health care provider. Make sure you discuss any questions you have with your health care provider. Document Released: 05/15/2015 Document Revised: 01/06/2016 Document Reviewed: 02/17/2015 Elsevier Interactive Patient Education  2017 [place] Prevention in the Home Falls can cause injuries. They can happen to people of all ages. There are many things you can do to make your home safe and to help prevent falls. What can I do on the outside of my home?  Regularly fix the edges of walkways and driveways and fix any cracks.  Remove anything that might make you trip as you walk through a door, such as a raised step or threshold.  Trim any bushes or trees on the path to your home.  Use bright outdoor lighting.  Clear any walking paths of anything that might make someone trip, such as rocks or tools.  Regularly check to see if handrails are loose or broken. Make sure that both sides of any steps have handrails.  Any raised decks and porches should have guardrails on the edges.  Have any leaves, snow, or ice cleared regularly.  Use sand or salt on walking paths during winter.  Clean up any spills in your garage right away. This includes oil or grease spills. What can I do in the bathroom?  Use night lights.  Install grab bars by the toilet and in the tub and shower. Do not use towel bars as grab bars.  Use non-skid mats or decals in the tub or shower.  If you need to sit down in the shower, use a plastic, non-slip stool.  Keep the floor dry. Clean up any  water that spills on the floor as soon as it happens.  Remove soap buildup in the tub or shower regularly.  Attach bath mats securely with double-sided non-slip rug tape.  Do not have throw rugs and other things on the floor that can make you trip. What can I do in the bedroom?  Use night lights.  Make sure that you have a light by your bed that is easy to reach.  Do not use any sheets or blankets that are too big for your bed. They should not hang down onto the floor.  Have a firm chair that has side arms. You can use this for support while you get dressed.  Do not have throw rugs and other things on the floor that can make you trip. What can I do in the kitchen?  Clean up any spills right away.  Avoid walking on wet floors.  Keep items that you use a lot in easy-to-reach places.  If you need to reach something above you, use a strong step stool that has a grab bar.  Keep electrical cords out of the way.  Do not use floor polish or wax that makes floors slippery. If you must use wax, use non-skid floor wax.  Do not have throw rugs and other things on the floor that can make you trip. What can I do with my stairs?  Do not leave any items on the stairs.  Make sure that there are handrails on both sides of the stairs and use them. Fix handrails that are broken or loose. Make sure that handrails are as long as the stairways.  Check any carpeting to make sure that it is firmly attached to the stairs. Fix any carpet that is loose or worn.  Avoid having throw rugs at the top or bottom of the stairs. If you do have throw rugs, attach them to the floor with carpet tape.  Make sure that you have a light switch at the top of the stairs and the bottom of the stairs. If you do not have them, ask someone to add them for you. What else can I do to help prevent falls?  Wear shoes that:  Do not have high heels.  Have rubber bottoms.  Are comfortable and fit you well.  Are closed  at the toe. Do not wear sandals.  If you use a stepladder:  Make sure that it is fully opened. Do not climb a closed stepladder.  Make sure that both sides of the stepladder are locked into place.  Ask someone to hold it for you, if possible.  Clearly mark and make sure that you can see:  Any grab bars or handrails.  First and last steps.  Where the edge of each step is.  Use tools that help you move around (mobility aids) if they are needed. These include:  Canes.  Walkers.  Scooters.  Crutches.  Turn on the lights when you go into a dark area. Replace any light bulbs as soon as they burn out.  Set up your furniture so you have a clear path. Avoid moving your furniture around.  If any of your floors are uneven, fix them.  If there are any pets around you, be aware of where they are.  Review your medicines with your doctor. Some medicines can make you feel dizzy. This can increase your chance of falling. Ask your doctor what other things that you can do to help prevent falls. This information is not intended to replace advice given to you by your health care provider. Make sure you discuss any questions you have with your health care provider. Document Released: 02/12/2009 Document Revised: 09/24/2015 Document Reviewed: 05/23/2014 Elsevier Interactive Patient Education  2017 [employer].

## 2020-02-20 LAB — CBC
Hematocrit: 31.6 % — ABNORMAL LOW (ref 34.0–46.6)
Hemoglobin: 10.3 g/dL — ABNORMAL LOW (ref 11.1–15.9)
MCH: 28.2 pg (ref 26.6–33.0)
MCHC: 32.6 g/dL (ref 31.5–35.7)
MCV: 87 fL (ref 79–97)
Platelets: 361 10*3/uL (ref 150–450)
RBC: 3.65 x10E6/uL — ABNORMAL LOW (ref 3.77–5.28)
RDW: 14.1 % (ref 11.7–15.4)
WBC: 6.6 10*3/uL (ref 3.4–10.8)

## 2020-02-20 LAB — CMP14+EGFR
ALT: 9 IU/L (ref 0–32)
AST: 16 IU/L (ref 0–40)
Albumin/Globulin Ratio: 1.6 (ref 1.2–2.2)
Albumin: 4.3 g/dL (ref 3.8–4.8)
Alkaline Phosphatase: 65 IU/L (ref 44–121)
BUN/Creatinine Ratio: 21 (ref 12–28)
BUN: 18 mg/dL (ref 8–27)
Bilirubin Total: 0.3 mg/dL (ref 0.0–1.2)
CO2: 28 mmol/L (ref 20–29)
Calcium: 9.5 mg/dL (ref 8.7–10.3)
Chloride: 100 mmol/L (ref 96–106)
Creatinine, Ser: 0.85 mg/dL (ref 0.57–1.00)
GFR calc Af Amer: 80 mL/min/{1.73_m2} (ref >59)
GFR calc non Af Amer: 70 mL/min/{1.73_m2} (ref >59)
Globulin, Total: 2.7 g/dL (ref 1.5–4.5)
Glucose: 86 mg/dL (ref 65–99)
Potassium: 3.4 mmol/L — ABNORMAL LOW (ref 3.5–5.2)
Sodium: 143 mmol/L (ref 134–144)
Total Protein: 7 g/dL (ref 6.0–8.5)

## 2020-02-20 LAB — VITAMIN B12: Vitamin B-12: 1067 pg/mL (ref 232–1245)

## 2020-02-20 LAB — HEMOGLOBIN A1C
Est. average glucose Bld gHb Est-mCnc: 134 mg/dL
Hgb A1c MFr Bld: 6.3 % — ABNORMAL HIGH (ref 4.8–5.6)

## 2020-02-20 LAB — TSH: TSH: 1.44 u[IU]/mL (ref 0.450–4.500)

## 2020-03-16 ENCOUNTER — Encounter: Payer: Self-pay | Admitting: Nurse Practitioner

## 2020-03-18 ENCOUNTER — Telehealth: Payer: Self-pay

## 2020-03-18 ENCOUNTER — Other Ambulatory Visit: Payer: Self-pay

## 2020-03-18 MED ORDER — POTASSIUM CHLORIDE ER 10 MEQ PO TBCR: 10.0000 meq | EXTENDED_RELEASE_TABLET | Freq: Every day | ORAL | 0 refills | Status: DC

## 2020-03-18 NOTE — Telephone Encounter (Signed)
Yes

## 2020-03-18 NOTE — Telephone Encounter (Signed)
Is it ok to refill potassium?

## 2020-03-20 ENCOUNTER — Other Ambulatory Visit: Payer: Self-pay | Admitting: Internal Medicine

## 2020-03-23 ENCOUNTER — Other Ambulatory Visit: Payer: Self-pay | Admitting: Nurse Practitioner

## 2020-04-11 ENCOUNTER — Other Ambulatory Visit: Payer: Self-pay | Admitting: Nurse Practitioner

## 2020-05-25 ENCOUNTER — Other Ambulatory Visit: Payer: Self-pay | Admitting: Nurse Practitioner

## 2020-05-25 ENCOUNTER — Other Ambulatory Visit: Payer: Self-pay

## 2020-05-25 MED ORDER — LOSARTAN POTASSIUM 100 MG PO TABS: 100.0000 mg | ORAL_TABLET | Freq: Every day | ORAL | 0 refills | Status: DC

## 2020-05-25 MED ORDER — LOSARTAN POTASSIUM 100 MG PO TABS
100.0000 mg | ORAL_TABLET | Freq: Every day | ORAL | 0 refills | Status: DC
Start: 2020-05-25 — End: 2020-05-25

## 2020-05-27 ENCOUNTER — Other Ambulatory Visit: Payer: Self-pay

## 2020-05-27 MED ORDER — LOSARTAN POTASSIUM 100 MG PO TABS: 100.0000 mg | ORAL_TABLET | Freq: Every day | ORAL | 0 refills | Status: DC

## 2020-06-23 ENCOUNTER — Other Ambulatory Visit: Payer: Self-pay

## 2020-06-23 ENCOUNTER — Ambulatory Visit (INDEPENDENT_AMBULATORY_CARE_PROVIDER_SITE_OTHER): Payer: Medicare Other | Admitting: Nurse Practitioner

## 2020-06-23 ENCOUNTER — Encounter: Payer: Self-pay | Admitting: Nurse Practitioner

## 2020-06-23 VITALS — BP 132/80 | HR 81 | Temp 98.0°F | Ht 64.0 in | Wt 234.2 lb

## 2020-06-23 DIAGNOSIS — Z23 Encounter for immunization: Secondary | ICD-10-CM

## 2020-06-23 DIAGNOSIS — E785 Hyperlipidemia, unspecified: Secondary | ICD-10-CM

## 2020-06-23 DIAGNOSIS — I10 Essential (primary) hypertension: Secondary | ICD-10-CM | POA: Diagnosis not present

## 2020-06-23 DIAGNOSIS — Z6841 Body Mass Index (BMI) 40.0 and over, adult: Secondary | ICD-10-CM

## 2020-06-23 DIAGNOSIS — Z862 Personal history of diseases of the blood and blood-forming organs and certain disorders involving the immune mechanism: Secondary | ICD-10-CM

## 2020-06-23 DIAGNOSIS — R7303 Prediabetes: Secondary | ICD-10-CM

## 2020-06-23 DIAGNOSIS — R0981 Nasal congestion: Secondary | ICD-10-CM | POA: Diagnosis not present

## 2020-06-23 DIAGNOSIS — R413 Other amnesia: Secondary | ICD-10-CM

## 2020-06-23 DIAGNOSIS — H9193 Unspecified hearing loss, bilateral: Secondary | ICD-10-CM | POA: Diagnosis not present

## 2020-06-23 DIAGNOSIS — E66813 Obesity, class 3: Secondary | ICD-10-CM

## 2020-06-23 MED ORDER — AZITHROMYCIN 250 MG PO TABS: ORAL_TABLET | ORAL | 0 refills | Status: AC

## 2020-06-23 MED ORDER — TETANUS-DIPHTH-ACELL PERTUSSIS 5-2.5-18.5 LF-MCG/0.5 IM SUSP: 0.5000 mL | Freq: Once | INTRAMUSCULAR | 0 refills | Status: AC

## 2020-06-23 NOTE — Patient Instructions (Signed)

## 2020-06-23 NOTE — Progress Notes (Signed)
I,Yamilka Roman [employer] as a Education administrator for [employer], FNP.,have documented all relevant documentation on the behalf of [name], FNP,as directed by  [name], FNP while in the presence of [name], [place]. This visit occurred during the SARS-CoV-2 public health emergency.  Safety protocols were in place, including screening questions prior to the visit, additional usage of staff PPE, and extensive cleaning of exam room while observing appropriate contact time as indicated for disinfecting solutions.  Subjective:     Patient ID: Rhonda [name] , female    DOB: 1950-10-08 , 70 y.o.   MRN: [id number]   Chief Complaint  Patient presents with  . Hypertension  . Otalgia    Patient stated her ears are hurting here and her nose and eyes are running a lot. She reports having a headache as well.     HPI  Patient presents today for a blood pressure f/u.  She reports having nasal drainage over the last 2 days.  She is also having bilateral ear pain. Denies fever. Reports headache.  Denies diarrhea or nausea. Denies cough.    Wt Readings from Last 3 Encounters: 06/23/20 : 234 lb 3.2 oz (106.2 kg) 02/19/20 : 227 lb (103 kg) 02/19/20 : 227 lb (103 kg)  Hypertension This is a chronic problem. The current episode started more than 1 year ago. The problem is unchanged. The problem is controlled. Associated symptoms include headaches (intermittent headaches). Pertinent negatives include no anxiety. There are no associated agents to hypertension. Risk factors for coronary artery disease include obesity and sedentary lifestyle.     Past Medical History:  Diagnosis Date  . Anemia    hx none since menopause  . Anginal pain ([place])    due to chest wall from cough  . Arthritis    "maybe in my legs" (06/15/2016)  . Asthma 2018   none since  . Complication of anesthesia    woke up during colonoscopy 03-31-16 Dr. [place]  . GERD (gastroesophageal reflux disease)   . Heart murmur 2018  .  History of hiatal hernia   . Hyperlipemia   . Hypertension   . Pre-diabetes 2019  . Sinus congestion    "chronic" (06/15/2016)  . Sinus headache    "a few/month" (06/15/2016)  . Thyroid nodule 2010   "no problems w/them since; on a pill for a while" (06/15/2016)     Family History  Problem Relation Age of Onset  . COPD Father   . Breast cancer Neg Hx      Current Outpatient Medications:  .  acetaminophen (TYLENOL) 650 MG CR tablet, Take 1,300 mg by mouth at bedtime as needed for pain. , Disp: , Rfl:  .  atorvastatin (LIPITOR) 10 MG tablet, Take 1 tablet by mouth once daily, Disp: 90 tablet, Rfl: 0 .  Cholecalciferol (DIALYVITE VITAMIN D 5000) 125 MCG (5000 UT) capsule, Take 5,000 Units by mouth daily., Disp: , Rfl:  .  dorzolamide-timolol (COSOPT) 22.3-6.8 MG/ML ophthalmic solution, Place 1 drop into both eyes 2 (two) times daily. , Disp: , Rfl:  .  esomeprazole (NEXIUM) 40 MG capsule, Take 1 capsule (40 mg total) by mouth daily at 12 noon., Disp: 30 capsule, Rfl: 0 .  fluticasone (FLONASE) 50 MCG/ACT nasal spray, Use 1 spray(s) in each nostril once daily (Patient taking differently: Place 1 spray into both nostrils daily.), Disp: 16 g, Rfl: 1 .  hydrochlorothiazide (HYDRODIURIL) 25 MG tablet, Take 1 tablet by mouth once daily, Disp: 90 tablet, Rfl: 0 .  ibuprofen (ADVIL) 600 MG tablet, Take 1 tablet (600 mg total) by mouth every 6 (six) hours as needed., Disp: 30 tablet, Rfl: 0 .  Lansoprazole (PREVACID PO), Take 1 tablet by mouth daily at 6 (six) AM., Disp: , Rfl:  .  levocetirizine (XYZAL) 5 MG tablet, TAKE 1 TABLET BY MOUTH ONCE DAILY IN THE EVENING (Patient taking differently: Take 5 mg by mouth every evening.), Disp: 30 tablet, Rfl: 0 .  losartan (COZAAR) 100 MG tablet, Take 1 tablet (100 mg total) by mouth daily., Disp: 90 tablet, Rfl: 0 .  Multiple Vitamin (MULTIVITAMIN WITH MINERALS) TABS tablet, Take 1 tablet by mouth daily., Disp: , Rfl:  .  polyethylene glycol (MIRALAX /  GLYCOLAX) 17 g packet, Take 17 g by mouth daily as needed for mild constipation., Disp: 14 each, Rfl: 0 .  potassium chloride (KLOR-CON) 10 MEQ tablet, Take 1 tablet by mouth once daily, Disp: 90 tablet, Rfl: 0 .  Probiotic CAPS, Take 1 capsule by mouth daily., Disp: , Rfl:    Allergies  Allergen Reactions  . Aspirin-Acetaminophen-Caffeine Nausea And Vomiting  . Codeine Diarrhea and Nausea And Vomiting    Stomach cramps   . Oxycontin [Oxycodone Hcl] Other (See Comments)    Hallucination      Review of Systems  Constitutional: Negative.   HENT: Positive for congestion and ear pain (bilateral ear pain).        She reports having problems with her hearing over the last several months  Respiratory: Negative.   Cardiovascular: Negative.   Neurological: Positive for headaches (intermittent headaches).       She feels like she is having problems with her memory over the last few months.   Psychiatric/Behavioral: Negative.      Today's Vitals   06/23/20 1020  BP: 132/80  Pulse: 81  Temp: 98 F (36.7 C)  TempSrc: Oral  Weight: 234 lb 3.2 oz (106.2 kg)  Height: '5\' 4"'  (1.626 m)  PainSc: 0-No pain   Body mass index is 40.2 kg/m.   Objective:  Physical Exam Constitutional:      General: She is not in acute distress.    Appearance: Normal appearance. She is obese.  HENT:     Right Ear: Tympanic membrane, ear canal and external ear normal. There is no impacted cerumen.     Left Ear: Tympanic membrane, ear canal and external ear normal. There is no impacted cerumen.  Eyes:     Pupils: Pupils are equal, round, and reactive to light.  Cardiovascular:     Rate and Rhythm: Normal rate and regular rhythm.     Pulses: Normal pulses.     Heart sounds: Normal heart sounds. No murmur heard.   Pulmonary:     Effort: Pulmonary effort is normal. No respiratory distress.     Breath sounds: Normal breath sounds. No wheezing.  Skin:    General: Skin is warm and dry.     Capillary  Refill: Capillary refill takes less than 2 seconds.  Neurological:     General: No focal deficit present.     Mental Status: She is alert and oriented to person, place, and time.     Cranial Nerves: No cranial nerve deficit.  Psychiatric:        Mood and Affect: Mood normal.        Behavior: Behavior normal.        Thought Content: Thought content normal.        Judgment: Judgment normal.  Assessment And Plan:     1. Essential hypertension . B/P is controlled.  . CMP ordered to check renal function.  . The importance of regular exercise and dietary modification was stressed to the patient.  . Stressed importance of losing ten percent of her body weight to help with B/P control.  - CMP14+EGFR  2. Bilateral hearing loss, unspecified hearing loss type  No abnormal findings on physical exam  Gross hearing intact  Will refer to ENT - Ambulatory referral to ENT  3. Class 3 severe obesity due to excess calories without serious comorbidity in adult, unspecified BMI (HCC)  Chronic  Discussed healthy diet and regular exercise options   Encouraged to exercise at least 150 minutes per week with 2 days of strength training as tolerated  4. Nasal congestion  She is to use flonase nasal spray as needed  Will check for covid   She is to remain isolated until she has results of her test - CBC with Differential/Platelet  5. Hyperlipidemia, unspecified hyperlipidemia type  Chronic, controlled  Continue with current medications, tolerating medications well  6. Prediabetes  Chronic, HgbA1c was slightly elevated at last visit  No current medications  Encouraged to limit intake of sugary foods and drinks  Encouraged to increase physical activity to 150 minutes per week - Hemoglobin A1c  7. Memory changes  Will check metabolic causes  Previous labs TSH and vitamin B12 were both normal at last visit  Her 6 CIT is 2  - T pallidum Screening [place]  8. History  of anemia - Iron, TIBC and Ferritin Panel     Patient was given opportunity to ask questions. Patient verbalized understanding of the plan and was able to repeat key elements of the plan. All questions were answered to their satisfaction.  [name], FNP   I, [name], FNP, have reviewed all documentation for this visit. The documentation on 06/23/20 for the exam, diagnosis, procedures, and orders are all accurate and complete.   THE PATIENT IS ENCOURAGED TO PRACTICE SOCIAL DISTANCING DUE TO THE COVID-19 PANDEMIC.

## 2020-06-24 ENCOUNTER — Encounter: Payer: Self-pay | Admitting: Nurse Practitioner

## 2020-06-24 LAB — CBC WITH DIFFERENTIAL/PLATELET
Basophils Absolute: 0.1 10*3/uL (ref 0.0–0.2)
Basos: 1 %
EOS (ABSOLUTE): 0.1 10*3/uL (ref 0.0–0.4)
Eos: 3 %
Hematocrit: 37.4 % (ref 34.0–46.6)
Hemoglobin: 12.1 g/dL (ref 11.1–15.9)
Immature Grans (Abs): 0 10*3/uL (ref 0.0–0.1)
Immature Granulocytes: 0 %
Lymphocytes Absolute: 1.8 10*3/uL (ref 0.7–3.1)
Lymphs: 39 %
MCH: 27.9 pg (ref 26.6–33.0)
MCHC: 32.4 g/dL (ref 31.5–35.7)
MCV: 86 fL (ref 79–97)
Monocytes Absolute: 0.3 10*3/uL (ref 0.1–0.9)
Monocytes: 7 %
Neutrophils Absolute: 2.3 10*3/uL (ref 1.4–7.0)
Neutrophils: 50 %
Platelets: 310 10*3/uL (ref 150–450)
RBC: 4.34 x10E6/uL (ref 3.77–5.28)
RDW: 12.9 % (ref 11.7–15.4)
WBC: 4.7 10*3/uL (ref 3.4–10.8)

## 2020-06-24 LAB — T PALLIDUM SCREENING CASCADE: T pallidum Antibodies (TP-PA): NONREACTIVE

## 2020-06-24 LAB — CMP14+EGFR
ALT: 11 IU/L (ref 0–32)
AST: 16 IU/L (ref 0–40)
Albumin/Globulin Ratio: 1.7 (ref 1.2–2.2)
Albumin: 4.7 g/dL (ref 3.7–4.7)
Alkaline Phosphatase: 72 IU/L (ref 44–121)
BUN/Creatinine Ratio: 25 (ref 12–28)
BUN: 22 mg/dL (ref 8–27)
Bilirubin Total: 0.4 mg/dL (ref 0.0–1.2)
CO2: 23 mmol/L (ref 20–29)
Calcium: 9.9 mg/dL (ref 8.7–10.3)
Chloride: 100 mmol/L (ref 96–106)
Creatinine, Ser: 0.89 mg/dL (ref 0.57–1.00)
GFR calc Af Amer: 75 mL/min/{1.73_m2} (ref >59)
GFR calc non Af Amer: 65 mL/min/{1.73_m2} (ref >59)
Globulin, Total: 2.7 g/dL (ref 1.5–4.5)
Glucose: 96 mg/dL (ref 65–99)
Potassium: 4.2 mmol/L (ref 3.5–5.2)
Sodium: 141 mmol/L (ref 134–144)
Total Protein: 7.4 g/dL (ref 6.0–8.5)

## 2020-06-24 LAB — HEMOGLOBIN A1C
Est. average glucose Bld gHb Est-mCnc: 120 mg/dL
Hgb A1c MFr Bld: 5.8 % — ABNORMAL HIGH (ref 4.8–5.6)

## 2020-06-24 LAB — NOVEL CORONAVIRUS, NAA: SARS-CoV-2, NAA: NOT DETECTED

## 2020-06-24 LAB — IRON,TIBC AND FERRITIN PANEL
Ferritin: 54 ng/mL (ref 15–150)
Iron Saturation: 29 % (ref 15–55)
Iron: 80 ug/dL (ref 27–139)
Total Iron Binding Capacity: 280 ug/dL (ref 250–450)
UIBC: 200 ug/dL (ref 118–369)

## 2020-06-24 LAB — SARS-COV-2, NAA 2 DAY TAT

## 2020-06-25 DIAGNOSIS — H401131 Primary open-angle glaucoma, bilateral, mild stage: Secondary | ICD-10-CM | POA: Diagnosis not present

## 2020-07-23 ENCOUNTER — Other Ambulatory Visit: Payer: Self-pay | Admitting: Nurse Practitioner

## 2020-07-29 ENCOUNTER — Ambulatory Visit (INDEPENDENT_AMBULATORY_CARE_PROVIDER_SITE_OTHER): Payer: Medicare Other | Admitting: Otolaryngology

## 2020-08-05 ENCOUNTER — Other Ambulatory Visit: Payer: Self-pay | Admitting: Nurse Practitioner

## 2020-08-19 ENCOUNTER — Other Ambulatory Visit: Payer: Self-pay

## 2020-08-19 ENCOUNTER — Ambulatory Visit (INDEPENDENT_AMBULATORY_CARE_PROVIDER_SITE_OTHER): Payer: Medicare Other | Admitting: Otolaryngology

## 2020-08-19 VITALS — Temp 96.6°F

## 2020-08-19 DIAGNOSIS — H903 Sensorineural hearing loss, bilateral: Secondary | ICD-10-CM | POA: Diagnosis not present

## 2020-08-19 DIAGNOSIS — H9313 Tinnitus, bilateral: Secondary | ICD-10-CM | POA: Diagnosis not present
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
## 2020-08-20 ENCOUNTER — Encounter (INDEPENDENT_AMBULATORY_CARE_PROVIDER_SITE_OTHER): Payer: Self-pay

## 2020-08-28 ENCOUNTER — Other Ambulatory Visit: Payer: Self-pay | Admitting: Nurse Practitioner

## 2020-08-31 ENCOUNTER — Other Ambulatory Visit: Payer: Self-pay | Admitting: Internal Medicine

## 2020-09-01 DIAGNOSIS — H905 Unspecified sensorineural hearing loss: Secondary | ICD-10-CM | POA: Diagnosis not present

## 2020-09-05 ENCOUNTER — Other Ambulatory Visit: Payer: Self-pay | Admitting: Nurse Practitioner

## 2020-09-13 ENCOUNTER — Other Ambulatory Visit: Payer: Self-pay | Admitting: Nurse Practitioner

## 2020-09-21 ENCOUNTER — Ambulatory Visit: Payer: Medicare Other | Admitting: Nurse Practitioner

## 2020-09-30 ENCOUNTER — Encounter: Payer: Self-pay | Admitting: Nurse Practitioner

## 2020-10-21 ENCOUNTER — Other Ambulatory Visit: Payer: Self-pay | Admitting: Nurse Practitioner

## 2020-10-26 DIAGNOSIS — H524 Presbyopia: Secondary | ICD-10-CM | POA: Diagnosis not present

## 2020-10-26 DIAGNOSIS — Z961 Presence of intraocular lens: Secondary | ICD-10-CM | POA: Diagnosis not present

## 2020-10-26 DIAGNOSIS — H401131 Primary open-angle glaucoma, bilateral, mild stage: Secondary | ICD-10-CM | POA: Diagnosis not present

## 2020-11-01 ENCOUNTER — Other Ambulatory Visit: Payer: Self-pay | Admitting: Nurse Practitioner

## 2020-11-05 ENCOUNTER — Other Ambulatory Visit: Payer: Self-pay | Admitting: Nurse Practitioner

## 2020-12-04 ENCOUNTER — Other Ambulatory Visit: Payer: Self-pay | Admitting: Podiatry

## 2020-12-08 ENCOUNTER — Other Ambulatory Visit: Payer: Self-pay | Admitting: Internal Medicine

## 2020-12-15 ENCOUNTER — Encounter: Payer: Self-pay | Admitting: Nurse Practitioner

## 2020-12-15 ENCOUNTER — Ambulatory Visit (INDEPENDENT_AMBULATORY_CARE_PROVIDER_SITE_OTHER): Payer: Medicare Other | Admitting: Nurse Practitioner

## 2020-12-15 ENCOUNTER — Other Ambulatory Visit: Payer: Self-pay

## 2020-12-15 VITALS — BP 142/80 | HR 78 | Temp 98.3°F | Ht 64.0 in | Wt 250.6 lb

## 2020-12-15 DIAGNOSIS — R7303 Prediabetes: Secondary | ICD-10-CM | POA: Diagnosis not present

## 2020-12-15 DIAGNOSIS — R002 Palpitations: Secondary | ICD-10-CM

## 2020-12-15 DIAGNOSIS — F419 Anxiety disorder, unspecified: Secondary | ICD-10-CM

## 2020-12-15 DIAGNOSIS — J00 Acute nasopharyngitis [common cold]: Secondary | ICD-10-CM

## 2020-12-15 LAB — POC COVID19 BINAXNOW: SARS Coronavirus 2 Ag: NEGATIVE

## 2020-12-15 MED ORDER — BLOOD GLUCOSE MONITOR KIT: PACK | 0 refills | Status: DC

## 2020-12-15 MED ORDER — MAGNESIUM GLUCONATE 250 MG PO TABS: 1.0000 | ORAL_TABLET | Freq: Every day | ORAL | 0 refills | Status: DC

## 2020-12-15 NOTE — Patient Instructions (Signed)

## 2020-12-15 NOTE — Progress Notes (Signed)
I,Tiffnay Bossi,acting as a Neurosurgeon for [name], FNP.,have documented all relevant documentation on the behalf of [name], FNP,as directed by  [name], FNP while in the presence of [name], FNP.   This visit occurred during the SARS-CoV-2 public health emergency.  Safety protocols were in place, including screening questions prior to the visit, additional usage of staff PPE, and extensive cleaning of exam room while observing appropriate contact time as indicated for disinfecting solutions.  Subjective:     Patient ID: Rhonda [name] , female    DOB: Jan 31, 1951 , 70 y.o.   MRN: [id number]   Chief Complaint  Patient presents with   Anxiety    HPI  Pt presents today complaining of heart beating fast, feeling faint and shaky. She began feeling like her heart rate increased. Lasting 15-20 minutes. She does admit to doing more than usual on that day. She reports an episode that happened this past Saturday. She noticed initially, 3 weeks ago. Pt also complains of ear pain, does not know if it is an ear infection. Admits to drinking 2 big cups of coffee a day.  She is also having runny nose and ear pain in the last 2 days. Has not done a covid test.   Anxiety Presents for initial visit. Onset was 1 to 4 weeks ago. Patient reports no chest pain or palpitations.      Past Medical History:  Diagnosis Date   Anemia    hx none since menopause   Anginal pain (HCC)    due to chest wall from cough   Arthritis    "maybe in my legs" (06/15/2016)   Asthma 2018   none since   Complication of anesthesia    woke up during colonoscopy 03-31-16 Dr. Laure Kidney   GERD (gastroesophageal reflux disease)    Heart murmur 2018   History of hiatal hernia    Hyperlipemia    Hypertension    Pre-diabetes 2019   Sinus congestion    "chronic" (06/15/2016)   Sinus headache    "a few/month" (06/15/2016)   Thyroid nodule 2010   "no problems w/them since; on a pill for a while" (06/15/2016)      Family History  Problem Relation Age of Onset   COPD Father    Breast cancer Neg Hx      Current Outpatient Medications:    acetaminophen (TYLENOL) 650 MG CR tablet, Take 1,300 mg by mouth at bedtime as needed for pain. , Disp: , Rfl:    atorvastatin (LIPITOR) 10 MG tablet, Take 1 tablet by mouth once daily, Disp: 90 tablet, Rfl: 0   blood glucose meter kit and supplies KIT, Dispense based on patient and insurance preference. Use up to four times daily as directed., Disp: 1 each, Rfl: 0   Cholecalciferol (DIALYVITE VITAMIN D 5000) 125 MCG (5000 UT) capsule, Take 5,000 Units by mouth daily., Disp: , Rfl:    dorzolamide-timolol (COSOPT) 22.3-6.8 MG/ML ophthalmic solution, Place 1 drop into both eyes 2 (two) times daily. , Disp: , Rfl:    esomeprazole (NEXIUM) 40 MG capsule, Take 1 capsule (40 mg total) by mouth daily at 12 noon., Disp: 30 capsule, Rfl: 0   fluticasone (FLONASE) 50 MCG/ACT nasal spray, Use 1 spray(s) in each nostril once daily, Disp: 16 g, Rfl: 0   hydrochlorothiazide (HYDRODIURIL) 25 MG tablet, Take 1 tablet by mouth once daily, Disp: 90 tablet, Rfl: 0   ibuprofen (ADVIL) 600 MG tablet, Take 1 tablet (600 mg total)  by mouth every 6 (six) hours as needed., Disp: 30 tablet, Rfl: 0   Lansoprazole (PREVACID PO), Take 1 tablet by mouth daily at 6 (six) AM., Disp: , Rfl:    levocetirizine (XYZAL) 5 MG tablet, TAKE 1 TABLET BY MOUTH ONCE DAILY IN THE EVENING (Patient taking differently: Take 5 mg by mouth every evening.), Disp: 30 tablet, Rfl: 0   losartan (COZAAR) 100 MG tablet, Take 1 tablet by mouth once daily, Disp: 90 tablet, Rfl: 0   Magnesium 250 MG TABS, Take 1 tablet (250 mg total) by mouth daily., Disp: 30 tablet, Rfl: 0   Multiple Vitamin (MULTIVITAMIN WITH MINERALS) TABS tablet, Take 1 tablet by mouth daily., Disp: , Rfl:    polyethylene glycol (MIRALAX / GLYCOLAX) 17 g packet, Take 17 g by mouth daily as needed for mild constipation., Disp: 14 each, Rfl: 0   potassium  chloride (KLOR-CON) 10 MEQ tablet, Take 1 tablet by mouth once daily, Disp: 90 tablet, Rfl: 0   Probiotic CAPS, Take 1 capsule by mouth daily., Disp: , Rfl:    Allergies  Allergen Reactions   Aspirin-Acetaminophen-Caffeine Nausea And Vomiting   Codeine Diarrhea and Nausea And Vomiting    Stomach cramps    Oxycontin [Oxycodone Hcl] Other (See Comments)    Hallucination      Review of Systems  Constitutional: Negative.   Respiratory: Negative.    Cardiovascular: Negative.  Negative for chest pain, palpitations and leg swelling.  Neurological: Negative.   Psychiatric/Behavioral: Negative.      Today's Vitals   12/15/20 1157  BP: (!) 142/80  Pulse: 78  Temp: 98.3 F (36.8 C)  Weight: 250 lb 9.6 oz (113.7 kg)  Height: 5\' 4"  (1.626 m)  PainSc: 0-No pain   Body mass index is 43.02 kg/m.  Wt Readings from Last 3 Encounters:  12/15/20 250 lb 9.6 oz (113.7 kg)  06/23/20 234 lb 3.2 oz (106.2 kg)  02/19/20 227 lb (103 kg)    Objective:  Physical Exam Constitutional:      General: She is not in acute distress.    Appearance: Normal appearance. She is obese.  HENT:     Right Ear: Tympanic membrane, ear canal and external ear normal. There is no impacted cerumen.     Left Ear: Tympanic membrane, ear canal and external ear normal. There is no impacted cerumen.  Eyes:     Pupils: Pupils are equal, round, and reactive to light.  Cardiovascular:     Rate and Rhythm: Normal rate and regular rhythm.     Pulses: Normal pulses.     Heart sounds: Normal heart sounds. No murmur heard. Pulmonary:     Effort: Pulmonary effort is normal. No respiratory distress.     Breath sounds: Normal breath sounds. No wheezing.  Skin:    General: Skin is warm and dry.     Capillary Refill: Capillary refill takes less than 2 seconds.  Neurological:     General: No focal deficit present.     Mental Status: She is alert and oriented to person, place, and time.     Cranial Nerves: No cranial nerve  deficit.  Psychiatric:        Mood and Affect: Mood normal.        Behavior: Behavior normal.        Thought Content: Thought content normal.        Judgment: Judgment normal.        Assessment And Plan:     1.  Anxiety Comments: This is a new concern, she is to decrease her intake of caffeine. Take magnesium nightly. May also be related to her divorce 4 years ago  2. Palpitations Comments: EKG done with SR  Avoid caffeine - EKG 12-Lead - TSH - T4 - T3, free  3. Prediabetes Comments: Rx for glucometer sent to ensure she is not having low blood sugars - blood glucose meter kit and supplies KIT; Dispense based on patient and insurance preference. Use up to four times daily as directed.  Dispense: 1 each; Refill: 0  4. Acute rhinitis Comments: Rapid covid test done negative, will send PCR - Novel Coronavirus, [facility] ([facility]) - POC COVID-19    Patient was given opportunity to ask questions. Patient verbalized understanding of the plan and was able to repeat key elements of the plan. All questions were answered to their satisfaction.  [name], FNP   I, [name], FNP, have reviewed all documentation for this visit. The documentation on 12/16/20 for the exam, diagnosis, procedures, and orders are all accurate and complete.   IF YOU HAVE BEEN REFERRED TO A SPECIALIST, IT MAY TAKE 1-2 WEEKS TO SCHEDULE/PROCESS THE REFERRAL. IF YOU HAVE NOT HEARD FROM US/SPECIALIST IN TWO WEEKS, PLEASE GIVE [place] A CALL AT [phone].   THE PATIENT IS ENCOURAGED TO PRACTICE SOCIAL DISTANCING DUE TO THE COVID-19 PANDEMIC.

## 2020-12-16 LAB — T4: T4, Total: 7.9 ug/dL (ref 4.5–12.0)

## 2020-12-16 LAB — T3, FREE: T3, Free: 2.4 pg/mL (ref 2.0–4.4)

## 2020-12-16 LAB — NOVEL CORONAVIRUS, NAA: SARS-CoV-2, NAA: NOT DETECTED

## 2020-12-16 LAB — TSH: TSH: 1.25 u[IU]/mL (ref 0.450–4.500)

## 2020-12-16 LAB — SARS-COV-2, NAA 2 DAY TAT

## 2020-12-16 MED ORDER — MAGNESIUM 250 MG PO TABS: 1.0000 | ORAL_TABLET | Freq: Every day | ORAL | 0 refills | Status: AC

## 2020-12-18 ENCOUNTER — Telehealth: Payer: Self-pay

## 2020-12-18 NOTE — Telephone Encounter (Signed)
Left the patient a message to call back for lab results. 

## 2020-12-18 NOTE — Telephone Encounter (Signed)
-----   Message from [name], FNP sent at 12/16/2020  5:16 PM EDT ----- All of your labs are normal and you are negative for covid, how are you feeling?

## 2020-12-19 ENCOUNTER — Other Ambulatory Visit: Payer: Self-pay | Admitting: Internal Medicine

## 2020-12-22 ENCOUNTER — Encounter: Payer: Self-pay | Admitting: Nurse Practitioner

## 2020-12-28 ENCOUNTER — Other Ambulatory Visit: Payer: Self-pay | Admitting: Nurse Practitioner

## 2020-12-28 DIAGNOSIS — Z1231 Encounter for screening mammogram for malignant neoplasm of breast: Secondary | ICD-10-CM

## 2021-01-18 ENCOUNTER — Other Ambulatory Visit: Payer: Self-pay | Admitting: Nurse Practitioner

## 2021-01-20 ENCOUNTER — Other Ambulatory Visit: Payer: Self-pay | Admitting: Nurse Practitioner

## 2021-01-25 ENCOUNTER — Telehealth: Payer: Self-pay

## 2021-01-25 NOTE — Telephone Encounter (Signed)
I left the pt a message that I was returning her all to schedule her an appt.

## 2021-01-26 ENCOUNTER — Ambulatory Visit (INDEPENDENT_AMBULATORY_CARE_PROVIDER_SITE_OTHER): Payer: Medicare Other | Admitting: Nurse Practitioner

## 2021-01-26 ENCOUNTER — Encounter: Payer: Self-pay | Admitting: Nurse Practitioner

## 2021-01-26 ENCOUNTER — Other Ambulatory Visit: Payer: Self-pay

## 2021-01-26 VITALS — Temp 98.6°F | Ht 64.0 in | Wt 249.8 lb

## 2021-01-26 DIAGNOSIS — R42 Dizziness and giddiness: Secondary | ICD-10-CM | POA: Diagnosis not present

## 2021-01-26 DIAGNOSIS — J0111 Acute recurrent frontal sinusitis: Secondary | ICD-10-CM

## 2021-01-26 DIAGNOSIS — H65111 Acute and subacute allergic otitis media (mucoid) (sanguinous) (serous), right ear: Secondary | ICD-10-CM

## 2021-01-26 MED ORDER — AMOXICILLIN-POT CLAVULANATE 875-125 MG PO TABS: 1.0000 | ORAL_TABLET | Freq: Two times a day (BID) | ORAL | 0 refills | Status: AC

## 2021-01-26 MED ORDER — MECLIZINE HCL 12.5 MG PO TABS: 12.5000 mg | ORAL_TABLET | Freq: Three times a day (TID) | ORAL | 0 refills | Status: DC | PRN

## 2021-01-26 NOTE — Progress Notes (Signed)
I,Katawbba Wiggins,acting as a Education administrator for [employer], NP.,have documented all relevant documentation on the behalf of [employer], NP,as directed by  Bary Castilla, NP while in the presence of Bary Castilla, NP.  This visit occurred during the SARS-CoV-2 public health emergency.  Safety protocols were in place, including screening questions prior to the visit, additional usage of staff PPE, and extensive cleaning of exam room while observing appropriate contact time as indicated for disinfecting solutions.  Subjective:     Patient ID: Rhonda [name] , female    DOB: July 16, 1950 , 70 y.o.   MRN: [id number]   Chief Complaint  Patient presents with   Dizziness   Otalgia    bilateral    HPI  She complains of being dizzy when she gets up. She also has some tenderness to her ears esp. Her right ear. She complains her right ear has been hurting with some tenderness. She also has some acute tenderness to her right upper cheek.     Past Medical History:  Diagnosis Date   Anemia    hx none since menopause   Anginal pain ([place])    due to chest wall from cough   Arthritis    "maybe in my legs" (06/15/2016)   Asthma 2018   none since   Complication of anesthesia    woke up during colonoscopy 03-31-16 Dr. Thornton Park   GERD (gastroesophageal reflux disease)    Heart murmur 2018   History of hiatal hernia    Hyperlipemia    Hypertension    Pre-diabetes 2019   Sinus congestion    "chronic" (06/15/2016)   Sinus headache    "a few/month" (06/15/2016)   Thyroid nodule 2010   "no problems w/them since; on a pill for a while" (06/15/2016)     Family History  Problem Relation Age of Onset   COPD Father    Breast cancer Neg Hx      Current Outpatient Medications:    amoxicillin-clavulanate (AUGMENTIN) 875-125 MG tablet, Take 1 tablet by mouth 2 (two) times daily for 7 days., Disp: 14 tablet, Rfl: 0   meclizine (ANTIVERT) 12.5 MG tablet, Take 1 tablet (12.5 mg total) by  mouth 3 (three) times daily as needed for dizziness., Disp: 30 tablet, Rfl: 0   acetaminophen (TYLENOL) 650 MG CR tablet, Take 1,300 mg by mouth at bedtime as needed for pain. , Disp: , Rfl:    atorvastatin (LIPITOR) 10 MG tablet, Take 1 tablet by mouth once daily, Disp: 90 tablet, Rfl: 0   blood glucose meter kit and supplies KIT, Dispense based on patient and insurance preference. Use up to four times daily as directed., Disp: 1 each, Rfl: 0   Cholecalciferol (DIALYVITE VITAMIN D 5000) 125 MCG (5000 UT) capsule, Take 5,000 Units by mouth daily., Disp: , Rfl:    dorzolamide-timolol (COSOPT) 22.3-6.8 MG/ML ophthalmic solution, Place 1 drop into both eyes 2 (two) times daily. , Disp: , Rfl:    esomeprazole (NEXIUM) 40 MG capsule, Take 1 capsule (40 mg total) by mouth daily at 12 noon., Disp: 30 capsule, Rfl: 0   fluticasone (FLONASE) 50 MCG/ACT nasal spray, Use 1 spray(s) in each nostril once daily, Disp: 16 g, Rfl: 0   hydrochlorothiazide (HYDRODIURIL) 25 MG tablet, Take 1 tablet by mouth once daily, Disp: 90 tablet, Rfl: 0   ibuprofen (ADVIL) 600 MG tablet, Take 1 tablet (600 mg total) by mouth every 6 (six) hours as needed., Disp: 30 tablet, Rfl: 0   Lansoprazole (  PREVACID PO), Take 1 tablet by mouth daily at 6 (six) AM., Disp: , Rfl:    levocetirizine (XYZAL) 5 MG tablet, TAKE 1 TABLET BY MOUTH ONCE DAILY IN THE EVENING (Patient taking differently: Take 5 mg by mouth every evening.), Disp: 30 tablet, Rfl: 0   losartan (COZAAR) 100 MG tablet, Take 1 tablet by mouth once daily, Disp: 90 tablet, Rfl: 0   Magnesium 250 MG TABS, Take 1 tablet (250 mg total) by mouth daily., Disp: 30 tablet, Rfl: 0   Multiple Vitamin (MULTIVITAMIN WITH MINERALS) TABS tablet, Take 1 tablet by mouth daily., Disp: , Rfl:    polyethylene glycol (MIRALAX / GLYCOLAX) 17 g packet, Take 17 g by mouth daily as needed for mild constipation., Disp: 14 each, Rfl: 0   potassium chloride (KLOR-CON) 10 MEQ tablet, Take 1 tablet by  mouth once daily, Disp: 90 tablet, Rfl: 0   Probiotic CAPS, Take 1 capsule by mouth daily., Disp: , Rfl:    Allergies  Allergen Reactions   Aspirin-Acetaminophen-Caffeine Nausea And Vomiting   Codeine Diarrhea and Nausea And Vomiting    Stomach cramps    Oxycontin [Oxycodone Hcl] Other (See Comments)    Hallucination      Review of Systems  Constitutional:  Negative for fever.  HENT:  Positive for congestion, ear pain and sinus pain. Negative for hearing loss.   Respiratory:  Negative for cough, shortness of breath and wheezing.   Cardiovascular:  Negative for chest pain and palpitations.  Gastrointestinal:  Negative for diarrhea and nausea.  Musculoskeletal:  Negative for arthralgias and myalgias.  Neurological:  Positive for dizziness. Negative for weakness and headaches.    Today's Vitals   01/26/21 1205  Temp: 98.6 F (37 C)  Weight: 249 lb 12.8 oz (113.3 kg)  Height: '5\' 4"'  (1.626 m)   Body mass index is 42.88 kg/m.   Objective:  Physical Exam Constitutional:      Appearance: Normal appearance. She is obese.  HENT:     Head: Normocephalic and atraumatic.     Right Ear: Tympanic membrane and ear canal normal. Drainage and tenderness present.     Left Ear: Tympanic membrane normal. No tenderness.  Cardiovascular:     Rate and Rhythm: Normal rate and regular rhythm.     Pulses: Normal pulses.     Heart sounds: Normal heart sounds. No murmur heard. Pulmonary:     Effort: Pulmonary effort is normal. No respiratory distress.     Breath sounds: Normal breath sounds. No wheezing.  Skin:    General: Skin is warm and dry.     Capillary Refill: Capillary refill takes less than 2 seconds.  Neurological:     Mental Status: She is alert and oriented to person, place, and time.        Assessment And Plan:     1. Acute recurrent frontal sinusitis - amoxicillin-clavulanate (AUGMENTIN) 875-125 MG tablet; Take 1 tablet by mouth 2 (two) times daily for 7 days.  Dispense: 14  tablet; Refill: 0 -Advised patient to use Xyzal as needed.  -Flonase as needed.   2. Acute allergic otitis media of right ear, recurrence not specified - amoxicillin-clavulanate (AUGMENTIN) 875-125 MG tablet; Take 1 tablet by mouth 2 (two) times daily for 7 days.  Dispense: 14 tablet; Refill: 0  3. Dizziness - meclizine (ANTIVERT) 12.5 MG tablet; Take 1 tablet (12.5 mg total) by mouth 3 (three) times daily as needed for dizziness.  Dispense: 30 tablet; Refill: 0  -Make sure  the patient stays hydrated with plenty of water.    The patient was encouraged to call or send a message through [place] for any questions or concerns.   Follow up: if symptoms persist or do not get better.   Side effects and appropriate use of all the medication(s) were discussed with the patient today. Patient advised to use the medication(s) as directed by their healthcare provider. The patient was encouraged to read, review, and understand all associated package inserts and contact our office with any questions or concerns. The patient accepts the risks of the treatment plan and had an opportunity to ask questions.     IF YOU HAVE BEEN REFERRED TO A SPECIALIST, IT MAY TAKE 1-2 WEEKS TO SCHEDULE/PROCESS THE REFERRAL. IF YOU HAVE NOT HEARD FROM US/SPECIALIST IN TWO WEEKS, PLEASE GIVE [place] A CALL AT [phone].   THE PATIENT IS ENCOURAGED TO PRACTICE SOCIAL DISTANCING DUE TO THE COVID-19 PANDEMIC.

## 2021-01-26 NOTE — Patient Instructions (Signed)
Dizziness Dizziness is a common problem. It is a feeling of unsteadiness or light-headedness. You may feel like you are about to faint. Dizziness can lead to injury if you stumble or fall. Anyone can become dizzy, but dizziness is more common in older adults. This condition can be caused by a number of things, including medicines, dehydration, or illness. Follow these instructions at home: Eating and drinking  Drink enough fluid to keep your urine pale yellow. This helps to keep you from becoming dehydrated. Try to drink more clear fluids, such as water. Do not drink alcohol. Limit your caffeine intake if told to do so by your health care provider. Check ingredients and nutrition facts to see if a food or beverage contains caffeine. Limit your salt (sodium) intake if told to do so by your health care provider. Check ingredients and nutrition facts to see if a food or beverage contains sodium. Activity  Avoid making quick movements. Rise slowly from chairs and steady yourself until you feel okay. In the morning, first sit up on the side of the bed. When you feel okay, stand slowly while you hold onto something until you know that your balance is good. If you need to stand in one place for a long time, move your legs often. Tighten and relax the muscles in your legs while you are standing. Do not drive or use machinery if you feel dizzy. Avoid bending down if you feel dizzy. Place items in your home so that they are easy for you to reach without leaning over. Lifestyle Do not use any products that contain nicotine or tobacco. These products include cigarettes, chewing tobacco, and vaping devices, such as e-cigarettes. If you need help quitting, ask your health care provider. Try to reduce your stress level by using methods such as yoga or meditation. Talk with your health care provider if you need help to manage your stress. General instructions Watch your dizziness for any changes. Take  over-the-counter and prescription medicines only as told by your health care provider. Talk with your health care provider if you think that your dizziness is caused by a medicine that you are taking. Tell a friend or a family member that you are feeling dizzy. If he or she notices any changes in your behavior, have this person call your health care provider. Keep all follow-up visits. This is important. Contact a health care provider if: Your dizziness does not go away or you have new symptoms. Your dizziness or light-headedness gets worse. You feel nauseous. You have reduced hearing. You have a fever. You have neck pain or a stiff neck. Your dizziness leads to an injury or a fall. Get help right away if: You vomit or have diarrhea and are unable to eat or drink anything. You have problems talking, walking, swallowing, or using your arms, hands, or legs. You feel generally weak. You have any bleeding. You are not thinking clearly or you have trouble forming sentences. It may take a friend or family member to notice this. You have chest pain, abdominal pain, shortness of breath, or sweating. Your vision changes or you develop a severe headache. These symptoms may represent a serious problem that is an emergency. Do not wait to see if the symptoms will go away. Get medical help right away. Call your local emergency services (911 in the [place] Do not drive yourself to the hospital. Summary Dizziness is a feeling of unsteadiness or light-headedness. This condition can be caused by a number of   things, including medicines, dehydration, or illness. Anyone can become dizzy, but dizziness is more common in older adults. Drink enough fluid to keep your urine pale yellow. Do not drink alcohol. Avoid making quick movements if you feel dizzy. Monitor your dizziness for any changes. This information is not intended to replace advice given to you by your health care provider. Make sure you discuss any  questions you have with your health care provider. Document Revised: 03/23/2020 Document Reviewed: 03/23/2020 Elsevier Patient Education  2022 Elsevier Inc.  

## 2021-02-02 DIAGNOSIS — H401131 Primary open-angle glaucoma, bilateral, mild stage: Secondary | ICD-10-CM | POA: Diagnosis not present

## 2021-02-04 ENCOUNTER — Ambulatory Visit
Admission: RE | Admit: 2021-02-04 | Discharge: 2021-02-04 | Disposition: A | Discharge status: Home / Self Care | Payer: Medicare Other | Source: Ambulatory Visit | Attending: Nurse Practitioner | Admitting: Nurse Practitioner

## 2021-02-04 ENCOUNTER — Other Ambulatory Visit: Payer: Self-pay

## 2021-02-04 DIAGNOSIS — Z1231 Encounter for screening mammogram for malignant neoplasm of breast: Secondary | ICD-10-CM | POA: Diagnosis not present

## 2021-02-21 ENCOUNTER — Other Ambulatory Visit: Payer: Self-pay | Admitting: Nurse Practitioner

## 2021-02-25 ENCOUNTER — Ambulatory Visit (INDEPENDENT_AMBULATORY_CARE_PROVIDER_SITE_OTHER): Payer: Medicare Other

## 2021-02-25 ENCOUNTER — Ambulatory Visit (INDEPENDENT_AMBULATORY_CARE_PROVIDER_SITE_OTHER): Payer: Medicare Other | Admitting: Nurse Practitioner

## 2021-02-25 ENCOUNTER — Ambulatory Visit: Payer: Medicare Other | Admitting: Nurse Practitioner

## 2021-02-25 ENCOUNTER — Encounter: Payer: Self-pay | Admitting: Nurse Practitioner

## 2021-02-25 ENCOUNTER — Other Ambulatory Visit: Payer: Self-pay

## 2021-02-25 VITALS — BP 136/70 | HR 72 | Temp 98.2°F | Ht 63.2 in | Wt 259.0 lb

## 2021-02-25 VITALS — BP 136/76 | HR 72 | Temp 98.2°F | Ht 63.2 in | Wt 259.0 lb

## 2021-02-25 DIAGNOSIS — Z0000 Encounter for general adult medical examination without abnormal findings: Secondary | ICD-10-CM | POA: Diagnosis not present

## 2021-02-25 DIAGNOSIS — I10 Essential (primary) hypertension: Secondary | ICD-10-CM | POA: Diagnosis not present

## 2021-02-25 DIAGNOSIS — R7303 Prediabetes: Secondary | ICD-10-CM

## 2021-02-25 DIAGNOSIS — E785 Hyperlipidemia, unspecified: Secondary | ICD-10-CM | POA: Diagnosis not present

## 2021-02-25 DIAGNOSIS — I700 Atherosclerosis of aorta: Secondary | ICD-10-CM | POA: Diagnosis not present

## 2021-02-25 DIAGNOSIS — Z6841 Body Mass Index (BMI) 40.0 and over, adult: Secondary | ICD-10-CM

## 2021-02-25 DIAGNOSIS — F419 Anxiety disorder, unspecified: Secondary | ICD-10-CM
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
## 2021-02-25 NOTE — Patient Instructions (Signed)
[name] , Thank you for taking time to come for your Medicare Wellness Visit. I appreciate your ongoing commitment to your health goals. Please review the following plan we discussed and let me know if I can assist you in the future.   Screening recommendations/referrals: Colonoscopy: completed 02/16/2017 Mammogram: completed 02/04/2021 Bone Density: completed 04/16/2019 Recommended yearly ophthalmology/optometry visit for glaucoma screening and checkup Recommended yearly dental visit for hygiene and checkup  Vaccinations: Influenza vaccine: completed 02/17/2021 Pneumococcal vaccine: completed 06/23/2020 Tdap vaccine: due Shingles vaccine: needs second dose   Covid-19: 09/24/2020, 03/13/2020, 06/29/2019, 06/08/2019  Advanced directives: Advance directive discussed with you today. Even though you declined this today please call our office should you change your mind and we can give you the proper paperwork for you to fill out.  Conditions/risks identified: none  Next appointment: Follow up in one year for your annual wellness visit    Preventive Care 65 Years and Older, Female Preventive care refers to lifestyle choices and visits with your health care provider that can promote health and wellness. What does preventive care include? A yearly physical exam. This is also called an annual well check. Dental exams once or twice a year. Routine eye exams. Ask your health care provider how often you should have your eyes checked. Personal lifestyle choices, including: Daily care of your teeth and gums. Regular physical activity. Eating a healthy diet. Avoiding tobacco and drug use. Limiting alcohol use. Practicing safe sex. Taking low-dose aspirin every day. Taking vitamin and mineral supplements as recommended by your health care provider. What happens during an annual well check? The services and screenings done by your health care provider during your annual well check will depend  on your age, overall health, lifestyle risk factors, and family history of disease. Counseling  Your health care provider may ask you questions about your: Alcohol use. Tobacco use. Drug use. Emotional well-being. Home and relationship well-being. Sexual activity. Eating habits. History of falls. Memory and ability to understand (cognition). Work and work Astronomer. Reproductive health. Screening  You may have the following tests or measurements: Height, weight, and BMI. Blood pressure. Lipid and cholesterol levels. These may be checked every 5 years, or more frequently if you are over 102 years old. Skin check. Lung cancer screening. You may have this screening every year starting at age 41 if you have a 30-pack-year history of smoking and currently smoke or have quit within the past 15 years. Fecal occult blood test (FOBT) of the stool. You may have this test every year starting at age 39. Flexible sigmoidoscopy or colonoscopy. You may have a sigmoidoscopy every 5 years or a colonoscopy every 10 years starting at age 71. Hepatitis C blood test. Hepatitis B blood test. Sexually transmitted disease (STD) testing. Diabetes screening. This is done by checking your blood sugar (glucose) after you have not eaten for a while (fasting). You may have this done every 1-3 years. Bone density scan. This is done to screen for osteoporosis. You may have this done starting at age 75. Mammogram. This may be done every 1-2 years. Talk to your health care provider about how often you should have regular mammograms. Talk with your health care provider about your test results, treatment options, and if necessary, the need for more tests. Vaccines  Your health care provider may recommend certain vaccines, such as: Influenza vaccine. This is recommended every year. Tetanus, diphtheria, and acellular pertussis (Tdap, Td) vaccine. You may need a Td booster every 10 years.  Zoster vaccine. You may need  this after age 71. Pneumococcal 13-valent conjugate (PCV13) vaccine. One dose is recommended after age 75. Pneumococcal polysaccharide (PPSV23) vaccine. One dose is recommended after age 34. Talk to your health care provider about which screenings and vaccines you need and how often you need them. This information is not intended to replace advice given to you by your health care provider. Make sure you discuss any questions you have with your health care provider. Document Released: 05/15/2015 Document Revised: 01/06/2016 Document Reviewed: 02/17/2015 Elsevier Interactive Patient Education  2017 [place] Prevention in the Home Falls can cause injuries. They can happen to people of all ages. There are many things you can do to make your home safe and to help prevent falls. What can I do on the outside of my home? Regularly fix the edges of walkways and driveways and fix any cracks. Remove anything that might make you trip as you walk through a door, such as a raised step or threshold. Trim any bushes or trees on the path to your home. Use bright outdoor lighting. Clear any walking paths of anything that might make someone trip, such as rocks or tools. Regularly check to see if handrails are loose or broken. Make sure that both sides of any steps have handrails. Any raised decks and porches should have guardrails on the edges. Have any leaves, snow, or ice cleared regularly. Use sand or salt on walking paths during winter. Clean up any spills in your garage right away. This includes oil or grease spills. What can I do in the bathroom? Use night lights. Install grab bars by the toilet and in the tub and shower. Do not use towel bars as grab bars. Use non-skid mats or decals in the tub or shower. If you need to sit down in the shower, use a plastic, non-slip stool. Keep the floor dry. Clean up any water that spills on the floor as soon as it happens. Remove soap buildup in the tub  or shower regularly. Attach bath mats securely with double-sided non-slip rug tape. Do not have throw rugs and other things on the floor that can make you trip. What can I do in the bedroom? Use night lights. Make sure that you have a light by your bed that is easy to reach. Do not use any sheets or blankets that are too big for your bed. They should not hang down onto the floor. Have a firm chair that has side arms. You can use this for support while you get dressed. Do not have throw rugs and other things on the floor that can make you trip. What can I do in the kitchen? Clean up any spills right away. Avoid walking on wet floors. Keep items that you use a lot in easy-to-reach places. If you need to reach something above you, use a strong step stool that has a grab bar. Keep electrical cords out of the way. Do not use floor polish or wax that makes floors slippery. If you must use wax, use non-skid floor wax. Do not have throw rugs and other things on the floor that can make you trip. What can I do with my stairs? Do not leave any items on the stairs. Make sure that there are handrails on both sides of the stairs and use them. Fix handrails that are broken or loose. Make sure that handrails are as long as the stairways. Check any carpeting to make sure that  it is firmly attached to the stairs. Fix any carpet that is loose or worn. Avoid having throw rugs at the top or bottom of the stairs. If you do have throw rugs, attach them to the floor with carpet tape. Make sure that you have a light switch at the top of the stairs and the bottom of the stairs. If you do not have them, ask someone to add them for you. What else can I do to help prevent falls? Wear shoes that: Do not have high heels. Have rubber bottoms. Are comfortable and fit you well. Are closed at the toe. Do not wear sandals. If you use a stepladder: Make sure that it is fully opened. Do not climb a closed stepladder. Make  sure that both sides of the stepladder are locked into place. Ask someone to hold it for you, if possible. Clearly mark and make sure that you can see: Any grab bars or handrails. First and last steps. Where the edge of each step is. Use tools that help you move around (mobility aids) if they are needed. These include: Canes. Walkers. Scooters. Crutches. Turn on the lights when you go into a dark area. Replace any light bulbs as soon as they burn out. Set up your furniture so you have a clear path. Avoid moving your furniture around. If any of your floors are uneven, fix them. If there are any pets around you, be aware of where they are. Review your medicines with your doctor. Some medicines can make you feel dizzy. This can increase your chance of falling. Ask your doctor what other things that you can do to help prevent falls. This information is not intended to replace advice given to you by your health care provider. Make sure you discuss any questions you have with your health care provider. Document Released: 02/12/2009 Document Revised: 09/24/2015 Document Reviewed: 05/23/2014 Elsevier Interactive Patient Education  2017 [employer].

## 2021-02-25 NOTE — Patient Instructions (Signed)

## 2021-02-25 NOTE — Progress Notes (Signed)
I,Rhonda [name],acting as a Education administrator for [employer], FNP.,have documented all relevant documentation on the behalf of Rhonda Brine, FNP,as directed by  Rhonda Brine, FNP while in the presence of Rhonda [name], [place].  This visit occurred during the SARS-CoV-2 public health emergency.  Safety protocols were in place, including screening questions prior to the visit, additional usage of staff PPE, and extensive cleaning of exam room while observing appropriate contact time as indicated for disinfecting solutions.  Subjective:     Patient ID: Rhonda [name] , female    DOB: 1951-04-06 , 70 y.o.   MRN: [id number]   Chief Complaint  Patient presents with   Hypertension    HPI  Patient presents today for a blood pressure f/u.    Hypertension This is a chronic problem. The current episode started more than 1 year ago. The problem is unchanged. The problem is controlled. Pertinent negatives include no anxiety. There are no associated agents to hypertension. Risk factors for coronary artery disease include obesity and sedentary lifestyle.    Past Medical History:  Diagnosis Date   Anemia    hx none since menopause   Anginal pain ([place])    due to chest wall from cough   Arthritis    "maybe in my legs" (06/15/2016)   Asthma 2018   none since   Complication of anesthesia    woke up during colonoscopy 03-31-16 Dr. Thornton [place]   GERD (gastroesophageal reflux disease)    Heart murmur 2018   History of hiatal hernia    Hyperlipemia    Hypertension    Pre-diabetes 2019   Sinus congestion    "chronic" (06/15/2016)   Sinus headache    "a few/month" (06/15/2016)   Thyroid nodule 2010   "no problems w/them since; on a pill for a while" (06/15/2016)     Family History  Problem Relation Age of Onset   COPD Father    Breast cancer Neg Hx      Current Outpatient Medications:    acetaminophen (TYLENOL) 650 MG CR tablet, Take 1,300 mg by mouth at bedtime as needed for pain. , Disp: , Rfl:     atorvastatin (LIPITOR) 10 MG tablet, Take 1 tablet by mouth once daily, Disp: 90 tablet, Rfl: 0   blood glucose meter kit and supplies KIT, Dispense based on patient and insurance preference. Use up to four times daily as directed., Disp: 1 each, Rfl: 0   Cholecalciferol (DIALYVITE VITAMIN D 5000) 125 MCG (5000 UT) capsule, Take 5,000 Units by mouth daily., Disp: , Rfl:    dorzolamide-timolol (COSOPT) 22.3-6.8 MG/ML ophthalmic solution, Place 1 drop into both eyes 2 (two) times daily. , Disp: , Rfl:    esomeprazole (NEXIUM) 40 MG capsule, Take 1 capsule (40 mg total) by mouth daily at 12 noon., Disp: 30 capsule, Rfl: 0   fluticasone (FLONASE) 50 MCG/ACT nasal spray, Use 1 spray(s) in each nostril once daily, Disp: 16 g, Rfl: 0   hydrochlorothiazide (HYDRODIURIL) 25 MG tablet, Take 1 tablet by mouth once daily, Disp: 90 tablet, Rfl: 0   ibuprofen (ADVIL) 600 MG tablet, Take 1 tablet (600 mg total) by mouth every 6 (six) hours as needed. (Patient not taking: Reported on 02/25/2021), Disp: 30 tablet, Rfl: 0   Lansoprazole (PREVACID PO), Take 1 tablet by mouth daily at 6 (six) AM. (Patient not taking: Reported on 02/25/2021), Disp: , Rfl:    levocetirizine (XYZAL) 5 MG tablet, TAKE 1 TABLET BY MOUTH ONCE DAILY IN THE EVENING (Patient  taking differently: Take 5 mg by mouth every evening.), Disp: 30 tablet, Rfl: 0   losartan (COZAAR) 100 MG tablet, Take 1 tablet by mouth once daily, Disp: 90 tablet, Rfl: 0   Magnesium 250 MG TABS, Take 1 tablet (250 mg total) by mouth daily., Disp: 30 tablet, Rfl: 0   meclizine (ANTIVERT) 12.5 MG tablet, Take 1 tablet (12.5 mg total) by mouth 3 (three) times daily as needed for dizziness., Disp: 30 tablet, Rfl: 0   Multiple Vitamin (MULTIVITAMIN WITH MINERALS) TABS tablet, Take 1 tablet by mouth daily., Disp: , Rfl:    polyethylene glycol (MIRALAX / GLYCOLAX) 17 g packet, Take 17 g by mouth daily as needed for mild constipation., Disp: 14 each, Rfl: 0   potassium chloride  (KLOR-CON) 10 MEQ tablet, Take 1 tablet by mouth once daily, Disp: 90 tablet, Rfl: 0   Probiotic CAPS, Take 1 capsule by mouth daily., Disp: , Rfl:    Allergies  Allergen Reactions   Aspirin-Acetaminophen-Caffeine Nausea And Vomiting   Codeine Diarrhea and Nausea And Vomiting    Stomach cramps    Oxycontin [Oxycodone Hcl] Other (See Comments)    Hallucination      Review of Systems  Constitutional: Negative.   Respiratory: Negative.    Cardiovascular: Negative.   Gastrointestinal: Negative.   Neurological: Negative.   Psychiatric/Behavioral: Negative.      Today's Vitals   02/25/21 1134  BP: 136/70  Pulse: 72  Temp: 98.2 F (36.8 C)  TempSrc: Oral  Weight: 259 lb (117.5 kg)  Height: 5' 3.2" (1.605 m)   Body mass index is 45.59 kg/m.  Wt Readings from Last 3 Encounters:  02/25/21 259 lb (117.5 kg)  02/25/21 259 lb (117.5 kg)  01/26/21 249 lb 12.8 oz (113.3 kg)    Objective:  Physical Exam Constitutional:      General: She is not in acute distress.    Appearance: Normal appearance. She is obese.  HENT:     Right Ear: Tympanic membrane, ear canal and external ear normal. There is no impacted cerumen.     Left Ear: Tympanic membrane, ear canal and external ear normal. There is no impacted cerumen.  Eyes:     Pupils: Pupils are equal, round, and reactive to light.  Cardiovascular:     Rate and Rhythm: Normal rate and regular rhythm.     Pulses: Normal pulses.     Heart sounds: Normal heart sounds. No murmur heard. Pulmonary:     Effort: Pulmonary effort is normal. No respiratory distress.     Breath sounds: Normal breath sounds. No wheezing.  Skin:    General: Skin is warm and dry.     Capillary Refill: Capillary refill takes less than 2 seconds.  Neurological:     General: No focal deficit present.     Mental Status: She is alert and oriented to person, place, and time.     Cranial Nerves: No cranial nerve deficit.  Psychiatric:        Mood and Affect:  Mood normal.        Behavior: Behavior normal.        Thought Content: Thought content normal.        Judgment: Judgment normal.        Assessment And Plan:     1. Essential hypertension Comments: Blood pressure is well controlled, continue current medications  2. Atherosclerosis of aorta (HCC) Comments: Continue statin, tolerating well - Lipid panel  3. Prediabetes Comments: Stable, no current  medications Encouraged to exercise regularly - Hemoglobin A1c - CMP14+EGFR - Lipid panel  4. Hyperlipidemia, unspecified hyperlipidemia type Comments: Stable, continue with statin and avoiding fried and fatty foods - Hemoglobin A1c  5. Anxiety Comments: She continues to struggle with anxiety and is interested in seeing a counselor - Ambulatory referral to Psychology  6. Class 3 severe obesity due to excess calories without serious comorbidity with body mass index (BMI) of 45.0 to 49.9 in adult [facility])  She is encouraged to strive for BMI less than 30 to decrease cardiac risk. Advised to aim for at least 150 minutes of exercise per week.    Patient was given opportunity to ask questions. Patient verbalized understanding of the plan and was able to repeat key elements of the plan. All questions were answered to their satisfaction.  Rhonda Brine, FNP   I, Rhonda Brine, FNP, have reviewed all documentation for this visit. The documentation on 03/10/21 for the exam, diagnosis, procedures, and orders are all accurate and complete.   IF YOU HAVE BEEN REFERRED TO A SPECIALIST, IT MAY TAKE 1-2 WEEKS TO SCHEDULE/PROCESS THE REFERRAL. IF YOU HAVE NOT HEARD FROM US/SPECIALIST IN TWO WEEKS, PLEASE GIVE [place] A CALL AT [phone].   THE PATIENT IS ENCOURAGED TO PRACTICE SOCIAL DISTANCING DUE TO THE COVID-19 PANDEMIC.

## 2021-03-11 ENCOUNTER — Ambulatory Visit: Payer: Medicare Other | Admitting: Nurse Practitioner

## 2021-03-11 ENCOUNTER — Encounter: Payer: Self-pay | Admitting: Nurse Practitioner

## 2021-03-11 ENCOUNTER — Other Ambulatory Visit: Payer: Self-pay

## 2021-03-11 ENCOUNTER — Ambulatory Visit
Admission: RE | Admit: 2021-03-11 | Discharge: 2021-03-11 | Disposition: A | Discharge status: Home / Self Care | Payer: Medicare Other | Source: Ambulatory Visit | Attending: Nurse Practitioner | Admitting: Nurse Practitioner

## 2021-03-11 VITALS — BP 134/78 | HR 57 | Temp 98.1°F | Ht 63.2 in | Wt 253.2 lb

## 2021-03-11 DIAGNOSIS — K5900 Constipation, unspecified: Secondary | ICD-10-CM

## 2021-03-11 DIAGNOSIS — R1084 Generalized abdominal pain: Secondary | ICD-10-CM

## 2021-03-11 DIAGNOSIS — R109 Unspecified abdominal pain: Secondary | ICD-10-CM | POA: Diagnosis not present

## 2021-03-11 NOTE — Progress Notes (Signed)
I,Katawbba Wiggins,acting as a Education administrator for [employer], FNP.,have documented all relevant documentation on the behalf of [name], FNP,as directed by  [name], FNP while in the presence of [name], [place].   This visit occurred during the SARS-CoV-2 public health emergency.  Safety protocols were in place, including screening questions prior to the visit, additional usage of staff PPE, and extensive cleaning of exam room while observing appropriate contact time as indicated for disinfecting solutions.  Subjective:     Patient ID: Rhonda [name] , female    DOB: 1950/12/30 , 70 y.o.   MRN: [id number]   Chief Complaint  Patient presents with   Abdominal Pain    Hernia surgery area    HPI  The patient is here for abdominal pain evaluation.  Abdominal Pain This is a new problem. The current episode started in the past 7 days (3-4 days ago). The pain is located in the periumbilical region. Associated symptoms include constipation. Pertinent negatives include no headaches or nausea. Relieved by: she took miralax and prunes in the few days. Treatments tried: prunes, miralax. Her past medical history is significant for abdominal surgery.    Past Medical History:  Diagnosis Date   Anemia    hx none since menopause   Anginal pain ([place])    due to chest wall from cough   Arthritis    "maybe in my legs" (06/15/2016)   Asthma 2018   none since   Complication of anesthesia    woke up during colonoscopy 03-31-16 Dr. Thornton Park   GERD (gastroesophageal reflux disease)    Heart murmur 2018   History of hiatal hernia    Hyperlipemia    Hypertension    Pre-diabetes 2019   Sinus congestion    "chronic" (06/15/2016)   Sinus headache    "a few/month" (06/15/2016)   Thyroid nodule 2010   "no problems w/them since; on a pill for a while" (06/15/2016)     Family History  Problem Relation Age of Onset   COPD Father    Breast cancer Neg Hx      Current Outpatient Medications:     acetaminophen (TYLENOL) 650 MG CR tablet, Take 1,300 mg by mouth at bedtime as needed for pain. , Disp: , Rfl:    atorvastatin (LIPITOR) 10 MG tablet, Take 1 tablet by mouth once daily, Disp: 90 tablet, Rfl: 0   blood glucose meter kit and supplies KIT, Dispense based on patient and insurance preference. Use up to four times daily as directed., Disp: 1 each, Rfl: 0   Cholecalciferol (DIALYVITE VITAMIN D 5000) 125 MCG (5000 UT) capsule, Take 5,000 Units by mouth daily., Disp: , Rfl:    dorzolamide-timolol (COSOPT) 22.3-6.8 MG/ML ophthalmic solution, Place 1 drop into both eyes 2 (two) times daily. , Disp: , Rfl:    esomeprazole (NEXIUM) 40 MG capsule, Take 1 capsule (40 mg total) by mouth daily at 12 noon., Disp: 30 capsule, Rfl: 0   fluticasone (FLONASE) 50 MCG/ACT nasal spray, Use 1 spray(s) in each nostril once daily, Disp: 16 g, Rfl: 0   hydrochlorothiazide (HYDRODIURIL) 25 MG tablet, Take 1 tablet by mouth once daily, Disp: 90 tablet, Rfl: 0   ibuprofen (ADVIL) 600 MG tablet, Take 1 tablet (600 mg total) by mouth every 6 (six) hours as needed. (Patient not taking: Reported on 02/25/2021), Disp: 30 tablet, Rfl: 0   Lansoprazole (PREVACID PO), Take 1 tablet by mouth daily at 6 (six) AM. (Patient not taking: Reported on 02/25/2021),  Disp: , Rfl:    levocetirizine (XYZAL) 5 MG tablet, TAKE 1 TABLET BY MOUTH ONCE DAILY IN THE EVENING (Patient taking differently: Take 5 mg by mouth every evening.), Disp: 30 tablet, Rfl: 0   losartan (COZAAR) 100 MG tablet, Take 1 tablet by mouth once daily, Disp: 90 tablet, Rfl: 0   Magnesium 250 MG TABS, Take 1 tablet (250 mg total) by mouth daily., Disp: 30 tablet, Rfl: 0   meclizine (ANTIVERT) 12.5 MG tablet, Take 1 tablet (12.5 mg total) by mouth 3 (three) times daily as needed for dizziness., Disp: 30 tablet, Rfl: 0   Multiple Vitamin (MULTIVITAMIN WITH MINERALS) TABS tablet, Take 1 tablet by mouth daily., Disp: , Rfl:    polyethylene glycol (MIRALAX / GLYCOLAX) 17  g packet, Take 17 g by mouth daily as needed for mild constipation., Disp: 14 each, Rfl: 0   potassium chloride (KLOR-CON) 10 MEQ tablet, Take 1 tablet by mouth once daily, Disp: 90 tablet, Rfl: 0   Probiotic CAPS, Take 1 capsule by mouth daily., Disp: , Rfl:    Allergies  Allergen Reactions   Aspirin-Acetaminophen-Caffeine Nausea And Vomiting   Codeine Diarrhea and Nausea And Vomiting    Stomach cramps    Oxycontin [Oxycodone Hcl] Other (See Comments)    Hallucination      Review of Systems  Constitutional: Negative.   Respiratory: Negative.    Cardiovascular: Negative.   Gastrointestinal:  Positive for abdominal pain and constipation. Negative for nausea.       Reports having heart burn, has not had since her surgery last year  Neurological:  Negative for dizziness and headaches.  Psychiatric/Behavioral: Negative.    All other systems reviewed and are negative.   Today's Vitals   03/11/21 1135  BP: 134/78  Pulse: (!) 57  Temp: 98.1 F (36.7 C)  Weight: 253 lb 3.2 oz (114.9 kg)  Height: 5' 3.2" (1.605 m)  PainSc: 6   PainLoc: Abdomen   Body mass index is 44.57 kg/m.  Wt Readings from Last 3 Encounters:  03/11/21 253 lb 3.2 oz (114.9 kg)  02/25/21 259 lb (117.5 kg)  02/25/21 259 lb (117.5 kg)    BP Readings from Last 3 Encounters:  03/11/21 134/78  02/25/21 136/70  02/25/21 136/76    Objective:  Physical Exam Vitals reviewed.  Constitutional:      General: She is not in acute distress.    Appearance: Normal appearance.  Cardiovascular:     Rate and Rhythm: Normal rate and regular rhythm.     Pulses: Normal pulses.     Heart sounds: Normal heart sounds. No murmur heard. Pulmonary:     Effort: Pulmonary effort is normal. No respiratory distress.     Breath sounds: Normal breath sounds.  Abdominal:     General: Bowel sounds are normal. There is no distension (rounded).     Palpations: Abdomen is soft.     Tenderness: There is generalized abdominal  tenderness.  Neurological:     General: No focal deficit present.     Mental Status: She is alert and oriented to person, place, and time.     Cranial Nerves: No cranial nerve deficit.  Psychiatric:        Mood and Affect: Mood normal.        Behavior: Behavior normal.        Thought Content: Thought content normal.        Judgment: Judgment normal.        Assessment And Plan:  1. Generalized abdominal pain Comments: Tenderness noted to abdomen generalized area, soft. Bowel sounds are present throughout - DG Abd 1 View; Future - Lipase - Amylase  2. Constipation, unspecified constipation type Comments: History of constipation, encouraged to stay well hydrated and high fiber diet. will check Xray for constipation. - DG Abd 1 View; Future    Patient was given opportunity to ask questions. Patient verbalized understanding of the plan and was able to repeat key elements of the plan. All questions were answered to their satisfaction.  [name], FNP   I, [name], FNP, have reviewed all documentation for this visit. The documentation on 03/11/21 for the exam, diagnosis, procedures, and orders are all accurate and complete.   IF YOU HAVE BEEN REFERRED TO A SPECIALIST, IT MAY TAKE 1-2 WEEKS TO SCHEDULE/PROCESS THE REFERRAL. IF YOU HAVE NOT HEARD FROM US/SPECIALIST IN TWO WEEKS, PLEASE GIVE [place] A CALL AT [phone].   THE PATIENT IS ENCOURAGED TO PRACTICE SOCIAL DISTANCING DUE TO THE COVID-19 PANDEMIC.

## 2021-03-12 LAB — LIPASE: Lipase: 24 U/L (ref 14–85)

## 2021-03-12 LAB — AMYLASE: Amylase: 75 U/L (ref 31–110)

## 2021-03-17 ENCOUNTER — Other Ambulatory Visit: Payer: Self-pay | Admitting: Nurse Practitioner

## 2021-04-03 ENCOUNTER — Other Ambulatory Visit: Payer: Self-pay | Admitting: Internal Medicine

## 2021-04-13 ENCOUNTER — Ambulatory Visit
Admission: RE | Admit: 2021-04-13 | Discharge: 2021-04-13 | Disposition: A | Discharge status: Home / Self Care | Payer: Medicare Other | Source: Ambulatory Visit | Attending: Allergy and Immunology | Admitting: Allergy and Immunology

## 2021-04-13 ENCOUNTER — Other Ambulatory Visit: Payer: Self-pay

## 2021-04-13 ENCOUNTER — Telehealth: Payer: Self-pay | Admitting: *Deleted

## 2021-04-13 ENCOUNTER — Ambulatory Visit (INDEPENDENT_AMBULATORY_CARE_PROVIDER_SITE_OTHER): Payer: Medicare Other | Admitting: Allergy and Immunology

## 2021-04-13 ENCOUNTER — Telehealth: Payer: Self-pay

## 2021-04-13 VITALS — BP 160/80 | HR 92 | Temp 97.1°F | Resp 18 | Ht 63.0 in | Wt 256.6 lb

## 2021-04-13 DIAGNOSIS — R0602 Shortness of breath: Secondary | ICD-10-CM

## 2021-04-13 DIAGNOSIS — J4530 Mild persistent asthma, uncomplicated: Secondary | ICD-10-CM | POA: Diagnosis not present

## 2021-04-13 DIAGNOSIS — I272 Pulmonary hypertension, unspecified: Secondary | ICD-10-CM

## 2021-04-13 DIAGNOSIS — L501 Idiopathic urticaria: Secondary | ICD-10-CM | POA: Diagnosis not present

## 2021-04-13 DIAGNOSIS — J3089 Other allergic rhinitis: Secondary | ICD-10-CM

## 2021-04-13 MED ORDER — ALBUTEROL SULFATE HFA 108 (90 BASE) MCG/ACT IN AERS: 2.0000 | INHALATION_SPRAY | RESPIRATORY_TRACT | 1 refills | Status: DC | PRN

## 2021-04-13 MED ORDER — FLUTICASONE PROPIONATE HFA 110 MCG/ACT IN AERO: 2.0000 | INHALATION_SPRAY | Freq: Every day | RESPIRATORY_TRACT | 5 refills | Status: DC

## 2021-04-13 MED ORDER — LEVOCETIRIZINE DIHYDROCHLORIDE 5 MG PO TABS: 5.0000 mg | ORAL_TABLET | Freq: Two times a day (BID) | ORAL | 5 refills | Status: DC | PRN

## 2021-04-13 NOTE — Telephone Encounter (Signed)
Per Dr. Lucie Leather please refer patient to a cardiologist for a exercise stress test. Thank you.

## 2021-04-13 NOTE — Patient Instructions (Addendum)
°  1.  Treat inflammation of airway with Flovent 110 -2 inhalations 1 time per day (empty lungs)  2.  Obtain a chest x-ray  3.  Obtain a exercise stress test with cardiology  4.  If needed: Albuterol HFA - 2 inhalations every 4-6 hours  5.  Can continue antihistamine on a daily basis - levocetirizine  6.  Return to clinic in 4 weeks or earlier if problem
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
## 2021-04-13 NOTE — Telephone Encounter (Signed)
PA has been submitted through CoverMyMeds for Xyzal and is currently pending approval/denial.

## 2021-04-14 ENCOUNTER — Encounter: Payer: Self-pay | Admitting: Allergy and Immunology

## 2021-04-14 NOTE — Telephone Encounter (Signed)
Received notification that the request was not needed since it is covered on the patients formulary through [employer].

## 2021-04-15 ENCOUNTER — Telehealth: Payer: Self-pay | Admitting: *Deleted

## 2021-04-15 ENCOUNTER — Other Ambulatory Visit: Payer: Self-pay | Admitting: *Deleted

## 2021-04-15 NOTE — Telephone Encounter (Signed)
Patients referral has been placed to [facility].  Patient has been informed and given their information. I told her to go ahead and contact their office to get scheduled as soon as possible. Patient agreed to go ahead and contact them.   [facility] [address] [place],  [place]  [address] Main: [phone]

## 2021-04-15 NOTE — Telephone Encounter (Signed)
PA has been submitted through CoverMyMeds for Albuterol HFA and is currently pending approval/denial.  

## 2021-05-23 ENCOUNTER — Other Ambulatory Visit: Payer: Self-pay | Admitting: Nurse Practitioner

## 2021-05-25 DIAGNOSIS — H401131 Primary open-angle glaucoma, bilateral, mild stage: Secondary | ICD-10-CM | POA: Diagnosis not present

## 2021-05-26 ENCOUNTER — Other Ambulatory Visit: Payer: Self-pay | Admitting: Nurse Practitioner

## 2021-06-08 ENCOUNTER — Encounter: Payer: Self-pay | Admitting: Allergy and Immunology

## 2021-06-08 ENCOUNTER — Other Ambulatory Visit: Payer: Self-pay

## 2021-06-08 ENCOUNTER — Ambulatory Visit (INDEPENDENT_AMBULATORY_CARE_PROVIDER_SITE_OTHER): Payer: Medicare Other | Admitting: Allergy and Immunology

## 2021-06-08 VITALS — BP 130/78 | HR 74 | Temp 97.7°F | Resp 16 | Ht 63.0 in | Wt 261.6 lb

## 2021-06-08 DIAGNOSIS — L501 Idiopathic urticaria: Secondary | ICD-10-CM | POA: Diagnosis not present

## 2021-06-08 DIAGNOSIS — J3089 Other allergic rhinitis: Secondary | ICD-10-CM | POA: Diagnosis not present

## 2021-06-08 DIAGNOSIS — R0602 Shortness of breath: Secondary | ICD-10-CM

## 2021-06-08 DIAGNOSIS — J4530 Mild persistent asthma, uncomplicated: Secondary | ICD-10-CM | POA: Diagnosis not present

## 2021-06-08 MED ORDER — ALBUTEROL SULFATE HFA 108 (90 BASE) MCG/ACT IN AERS: 2.0000 | INHALATION_SPRAY | RESPIRATORY_TRACT | 1 refills | Status: DC | PRN

## 2021-06-08 MED ORDER — LEVOCETIRIZINE DIHYDROCHLORIDE 5 MG PO TABS: 5.0000 mg | ORAL_TABLET | Freq: Two times a day (BID) | ORAL | 5 refills | Status: DC | PRN
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
## 2021-06-08 NOTE — Patient Instructions (Addendum)
°  1.  Treat inflammation of airway with an inhaled steroid that is the cheapest from insurance at a dose of 2 inhalations 1 time per day.  2.  Obtain exercise stress test with Dr. Tenny Craw next week  3.  If needed: Albuterol HFA - 2 inhalations every 4-6 hours  4.  Can continue antihistamine on a daily basis - levocetirizine  5.  Return to clinic in 8 weeks or earlier if problem+9

## 2021-06-09 ENCOUNTER — Encounter: Payer: Self-pay | Admitting: Allergy and Immunology

## 2021-06-13 ENCOUNTER — Other Ambulatory Visit: Payer: Self-pay | Admitting: Nurse Practitioner

## 2021-06-17 ENCOUNTER — Other Ambulatory Visit: Payer: Self-pay

## 2021-06-17 ENCOUNTER — Encounter: Payer: Self-pay | Admitting: Internal Medicine

## 2021-06-17 ENCOUNTER — Ambulatory Visit: Payer: Medicare Other | Admitting: Internal Medicine

## 2021-06-17 VITALS — BP 128/64 | HR 80 | Ht 63.0 in | Wt 257.0 lb

## 2021-06-17 DIAGNOSIS — R072 Precordial pain: Secondary | ICD-10-CM

## 2021-06-17 DIAGNOSIS — Z01810 Encounter for preprocedural cardiovascular examination: Secondary | ICD-10-CM

## 2021-06-17 DIAGNOSIS — R0609 Other forms of dyspnea: Secondary | ICD-10-CM

## 2021-06-17 MED ORDER — METOPROLOL TARTRATE 25 MG PO TABS: 25.0000 mg | ORAL_TABLET | Freq: Once | ORAL | 0 refills | Status: DC
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
## 2021-06-17 NOTE — Patient Instructions (Addendum)
Medication Instructions:  Your physician recommends that you continue on your current medications as directed. Please refer to the Current Medication list given to you today.  *If you need a refill on your cardiac medications before your next appointment, please call your pharmacy*   Lab Work: BMET, LIPOMED  If you have labs (blood work) drawn today and your tests are completely normal, you will receive your results only by: MyChart Message (if you have MyChart) OR A paper copy in the mail If you have any lab test that is abnormal or we need to change your treatment, we will call you to review the results.   Testing/Procedures:  Your physician has requested that you have an echocardiogram. Echocardiography is a painless test that uses sound waves to create images of your heart. It provides your doctor with information about the size and shape of your heart and how well your hearts chambers and valves are working. This procedure takes approximately one hour. There are no restrictions for this procedure.       Your cardiac CT will be scheduled at one of the below locations:   [facility] [address] [place], [place] [address] [phone]  OR  [facility] [address] [place], [place] [address] [phone]  If scheduled at [facility], please arrive at the [facility] main entrance (entrance A) of [facility] 30 minutes prior to test start time. You can use the FREE valet parking offered at the main entrance (encouraged to control the heart rate for the test) Proceed to the [facility] Radiology Department (first floor) to check-in and test prep.  If scheduled at [facility], please arrive 15 mins early for check-in and test prep.  Please follow these instructions carefully (unless otherwise directed):   On the Night Before the Test: Be sure to Drink plenty of  water. Do not consume any caffeinated/decaffeinated beverages or chocolate 12 hours prior to your test. Do not take any antihistamines 12 hours prior to your test.  On the Day of the Test: Drink plenty of water until 1 hour prior to the test. Do not eat any food 4 hours prior to the test. You may take your regular medications prior to the test.  Take metoprolol (Lopressor) two hours prior to test. HOLD Furosemide/Hydrochlorothiazide morning of the test. FEMALES- please wear underwire-free bra if available, avoid dresses & tight clothing        After the Test: Drink plenty of water. After receiving IV contrast, you may experience a mild flushed feeling. This is normal. On occasion, you may experience a mild rash up to 24 hours after the test. This is not dangerous. If this occurs, you can take Benadryl 25 mg and increase your fluid intake. If you experience trouble breathing, this can be serious. If it is severe call 911 IMMEDIATELY. If it is mild, please call our office. If you take any of these medications: Glipizide/Metformin, Avandament, Glucavance, please do not take 48 hours after completing test unless otherwise instructed.  We will call to schedule your test 2-4 weeks out understanding that some insurance companies will need an authorization prior to the service being performed.   For non-scheduling related questions, please contact the cardiac imaging nurse navigator should you have any questions/concerns: [place]well [place], Cardiac Imaging Nurse Navigator Larey Brick, Cardiac Imaging Nurse Navigator [facility] Direct Office Dial: [phone]   For scheduling needs, including  cancellations and rescheduling, please call [place], [phone].    Follow-Up: At [facility], you and your health needs are our priority.  As part of our continuing mission to provide you with exceptional heart care, we have created designated Provider Care Teams.   These Care Teams include your primary Cardiologist (physician) and Advanced Practice Providers (APPs -  Physician Assistants and Nurse Practitioners) who all work together to provide you with the care you need, when you need it.  We recommend signing up for the patient portal called "MyChart".  Sign up information is provided on this After Visit Summary.  MyChart is used to connect with patients for Virtual Visits (Telemedicine).  Patients are able to view lab/test results, encounter notes, upcoming appointments, etc.  Non-urgent messages can be sent to your provider as well.   To learn more about what you can do with MyChart, go to [email or web address].

## 2021-06-18 LAB — NMR, LIPOPROFILE
Cholesterol, Total: 216 mg/dL — ABNORMAL HIGH (ref 100–199)
HDL Particle Number: 32.2 umol/L (ref >=30.5)
HDL-C: 56 mg/dL (ref >39)
LDL Particle Number: 1465 nmol/L — ABNORMAL HIGH (ref <1000)
LDL Size: 21.6 nm (ref >20.5)
LDL-C (NIH Calc): 138 mg/dL — ABNORMAL HIGH (ref 0–99)
LP-IR Score: 40 (ref <=45)
Small LDL Particle Number: 379 nmol/L (ref <=527)
Triglycerides: 123 mg/dL (ref 0–149)

## 2021-06-18 LAB — BASIC METABOLIC PANEL
BUN/Creatinine Ratio: 29 — ABNORMAL HIGH (ref 12–28)
BUN: 23 mg/dL (ref 8–27)
CO2: 24 mmol/L (ref 20–29)
Calcium: 10.1 mg/dL (ref 8.7–10.3)
Chloride: 100 mmol/L (ref 96–106)
Creatinine, Ser: 0.78 mg/dL (ref 0.57–1.00)
Glucose: 98 mg/dL (ref 70–99)
Potassium: 4.1 mmol/L (ref 3.5–5.2)
Sodium: 141 mmol/L (ref 134–144)
eGFR: 81 mL/min/{1.73_m2} (ref >59)

## 2021-06-18 LAB — LIPOPROTEIN A (LPA): Lipoprotein (a): 207.9 nmol/L — ABNORMAL HIGH (ref <75.0)

## 2021-06-18 LAB — APOLIPOPROTEIN B: Apolipoprotein B: 110 mg/dL — ABNORMAL HIGH (ref <90)

## 2021-06-23 ENCOUNTER — Telehealth: Payer: Self-pay | Admitting: Internal Medicine

## 2021-06-23 NOTE — Telephone Encounter (Signed)
Called and spoke to patient. Reviewed results and Dr Tenny Craw' recommendation to await results of CT scan for further treatment decision. Pt aware to limit fatty, fried foods. States CT scan will be done 3/3.

## 2021-06-23 NOTE — Telephone Encounter (Signed)
Patient returned call for her lab results. °

## 2021-06-29 ENCOUNTER — Other Ambulatory Visit: Payer: Self-pay

## 2021-06-29 ENCOUNTER — Encounter: Payer: Self-pay | Admitting: Nurse Practitioner

## 2021-06-29 ENCOUNTER — Ambulatory Visit (INDEPENDENT_AMBULATORY_CARE_PROVIDER_SITE_OTHER): Payer: Medicare Other | Admitting: Nurse Practitioner

## 2021-06-29 VITALS — BP 130/80 | HR 72 | Temp 98.0°F | Ht 63.0 in | Wt 256.8 lb

## 2021-06-29 DIAGNOSIS — I10 Essential (primary) hypertension: Secondary | ICD-10-CM

## 2021-06-29 DIAGNOSIS — I272 Pulmonary hypertension, unspecified: Secondary | ICD-10-CM

## 2021-06-29 DIAGNOSIS — Z23 Encounter for immunization: Secondary | ICD-10-CM | POA: Diagnosis not present

## 2021-06-29 DIAGNOSIS — Z6841 Body Mass Index (BMI) 40.0 and over, adult: Secondary | ICD-10-CM

## 2021-06-29 DIAGNOSIS — R7303 Prediabetes: Secondary | ICD-10-CM

## 2021-06-29 LAB — HEMOGLOBIN A1C
Est. average glucose Bld gHb Est-mCnc: 126 mg/dL
Hgb A1c MFr Bld: 6 % — ABNORMAL HIGH (ref 4.8–5.6)

## 2021-06-29 NOTE — Progress Notes (Signed)
I,Victoria T Hamilton,acting as a Education administrator for [name], FNP.,have documented all relevant documentation on the behalf of [name], FNP,as directed by  [name], FNP while in the presence of [name], [place].  This visit occurred during the SARS-CoV-2 public health emergency.  Safety protocols were in place, including screening questions prior to the visit, additional usage of staff PPE, and extensive cleaning of exam room while observing appropriate contact time as indicated for disinfecting solutions.  Subjective:     Patient ID: Rhonda [name] , female    DOB: 1951-02-19 , 71 y.o.   MRN: [id number]   Chief Complaint  Patient presents with   Hypertension    HPI  PT presents today for BPC. She reports her allergy specialist referred her to a heart doctor, Dr Harrington Challenger who she seen on February 16th. She has to get an ECHO and and CT Coronary. Her allergy specialist has her on 2 Xyzal daily.  When she uses the vacuum cleaner or go up stairs she has the chest palpitations  Hypertension This is a chronic problem. The current episode started more than 1 year ago. The problem is unchanged. The problem is controlled. Pertinent negatives include no anxiety. There are no associated agents to hypertension. Risk factors for coronary artery disease include obesity and sedentary lifestyle. Compliance problems include exercise.  There is no history of chronic renal disease.    Past Medical History:  Diagnosis Date   Anemia    hx none since menopause   Anginal pain ([place])    due to chest wall from cough   Arthritis    "maybe in my legs" (06/15/2016)   Asthma 2018   none since   Complication of anesthesia    woke up during colonoscopy 03-31-16 Dr. Thornton Park   GERD (gastroesophageal reflux disease)    Heart murmur 2018   History of hiatal hernia    Hyperlipemia    Hypertension    Pre-diabetes 2019   Sinus congestion    "chronic" (06/15/2016)   Sinus headache    "a few/month" (06/15/2016)    Thyroid nodule 2010   "no problems w/them since; on a pill for a while" (06/15/2016)     Family History  Problem Relation Age of Onset   COPD Father    Breast cancer Neg Hx      Current Outpatient Medications:    acetaminophen (TYLENOL) 650 MG CR tablet, Take 1,300 mg by mouth at bedtime as needed for pain. , Disp: , Rfl:    albuterol (VENTOLIN HFA) 108 (90 Base) MCG/ACT inhaler, Inhale 2 puffs into the lungs every 4 (four) hours as needed for wheezing or shortness of breath., Disp: 18 g, Rfl: 1   Cholecalciferol (VITAMIN D3) 1.25 MG (50000 UT) CAPS, Take 1 tablet by mouth daily at 12 noon., Disp: , Rfl:    dorzolamide-timolol (COSOPT) 22.3-6.8 MG/ML ophthalmic solution, Place 1 drop into both eyes 2 (two) times daily. , Disp: , Rfl:    esomeprazole (NEXIUM) 40 MG capsule, Take 1 capsule (40 mg total) by mouth daily at 12 noon., Disp: 30 capsule, Rfl: 0   fluticasone (FLONASE) 50 MCG/ACT nasal spray, Use 1 spray(s) in each nostril once daily, Disp: 16 g, Rfl: 0   hydrochlorothiazide (HYDRODIURIL) 25 MG tablet, Take 1 tablet by mouth once daily, Disp: 90 tablet, Rfl: 0   levocetirizine (XYZAL) 5 MG tablet, Take 1 tablet (5 mg total) by mouth 2 (two) times daily as needed for allergies (Can use an  dose during flare ups.)., Disp: 60 tablet, Rfl: 5   losartan (COZAAR) 100 MG tablet, Take 1 tablet by mouth once daily, Disp: 90 tablet, Rfl: 0   Magnesium 250 MG TABS, Take 1 tablet (250 mg total) by mouth daily., Disp: 30 tablet, Rfl: 0   Multiple Vitamin (MULTIVITAMIN WITH MINERALS) TABS tablet, Take 1 tablet by mouth daily., Disp: , Rfl:    polyethylene glycol (MIRALAX / GLYCOLAX) 17 g packet, Take 17 g by mouth daily as needed for mild constipation., Disp: 14 each, Rfl: 0   potassium chloride (KLOR-CON) 10 MEQ tablet, Take 1 tablet by mouth once daily, Disp: 90 tablet, Rfl: 0   Probiotic CAPS, Take 1 capsule by mouth daily., Disp: , Rfl:    atorvastatin (LIPITOR) 20 MG tablet, Take 1 tablet  (20 mg total) by mouth daily., Disp: 90 tablet, Rfl: 3   blood glucose meter kit and supplies KIT, Dispense based on patient and insurance preference. Use up to four times daily as directed. (Patient not taking: Reported on 06/29/2021), Disp: 1 each, Rfl: 0   metoprolol tartrate (LOPRESSOR) 25 MG tablet, Take 1 tablet (25 mg total) by mouth once for 1 dose. Take 90-120 minutes prior to scan., Disp: 1 tablet, Rfl: 0   Allergies  Allergen Reactions   Aspirin-Acetaminophen-Caffeine Nausea And Vomiting   Codeine Diarrhea and Nausea And Vomiting    Stomach cramps    Oxycontin [Oxycodone Hcl] Other (See Comments)    Hallucination      Review of Systems  Constitutional: Negative.   Respiratory: Negative.    Cardiovascular: Negative.   Neurological: Negative.   Psychiatric/Behavioral: Negative.      Today's Vitals   06/29/21 1057  BP: 130/80  Pulse: 72  Temp: 98 F (36.7 C)  Weight: 256 lb 12.8 oz (116.5 kg)  Height: '5\' 3"'  (1.6 m)   Body mass index is 45.49 kg/m.   Objective:  Physical Exam Vitals reviewed.  Constitutional:      General: She is not in acute distress.    Appearance: Normal appearance.  Cardiovascular:     Rate and Rhythm: Normal rate and regular rhythm.     Pulses: Normal pulses.     Heart sounds: Normal heart sounds. No murmur heard. Pulmonary:     Effort: Pulmonary effort is normal. No respiratory distress.     Breath sounds: Normal breath sounds. No wheezing.  Neurological:     General: No focal deficit present.     Mental Status: She is alert and oriented to person, place, and time.     Cranial Nerves: No cranial nerve deficit.     Motor: No weakness.  Psychiatric:        Mood and Affect: Mood normal.        Behavior: Behavior normal.        Thought Content: Thought content normal.        Judgment: Judgment normal.        Assessment And Plan:     1. Essential hypertension Comments: blood pressure is fairly controlled. continue current  medications  2. Prediabetes Comments: Stable, diet controlled - Hemoglobin A1c  3. Pulmonary hypertension (HCC) Comments: Stable  4. Class 3 severe obesity due to excess calories without serious comorbidity in adult, unspecified BMI ([place]) She is encouraged to strive for BMI less than 30 to decrease cardiac risk. Advised to aim for at least 150 minutes of exercise per week.  5. Immunization due - Pneumococcal polysaccharide vaccine 23-valent greater than  or equal to 2yo subcutaneous/IM     Patient was given opportunity to ask questions. Patient verbalized understanding of the plan and was able to repeat key elements of the plan. All questions were answered to their satisfaction.  [name], FNP   I, [name], FNP, have reviewed all documentation for this visit. The documentation on 07/07/21 for the exam, diagnosis, procedures, and orders are all accurate and complete.   IF YOU HAVE BEEN REFERRED TO A SPECIALIST, IT MAY TAKE 1-2 WEEKS TO SCHEDULE/PROCESS THE REFERRAL. IF YOU HAVE NOT HEARD FROM US/SPECIALIST IN TWO WEEKS, PLEASE GIVE [place] A CALL AT [phone].   THE PATIENT IS ENCOURAGED TO PRACTICE SOCIAL DISTANCING DUE TO THE COVID-19 PANDEMIC.

## 2021-06-29 NOTE — Patient Instructions (Signed)

## 2021-07-01 ENCOUNTER — Telehealth (HOSPITAL_COMMUNITY): Payer: Self-pay | Admitting: *Deleted

## 2021-07-01 NOTE — Telephone Encounter (Signed)
Attempted to call patient regarding upcoming cardiac CT appointment. °Left message on voicemail with name and callback number ° °Ilaisaane Marts RN Navigator Cardiac Imaging °[facility] °[phone] Office °[phone] Cell ° °

## 2021-07-01 NOTE — Telephone Encounter (Signed)
Patient returning call regarding upcoming cardiac imaging study; pt verbalizes understanding of appt date/time, parking situation and where to check in, pre-test NPO status and medications ordered, and verified current allergies; name and call back number provided for further questions should they arise ? ?Gordy Clement RN Navigator Cardiac Imaging ?[facility] ?[phone] office ?[phone] cell ? ?Patient to take 25mg  metoprolol tartrate two hours prior to her cardiac CT scan.  She is aware to arrive at 11:30am for her noon scan. ?

## 2021-07-02 ENCOUNTER — Other Ambulatory Visit: Payer: Self-pay

## 2021-07-02 ENCOUNTER — Ambulatory Visit (HOSPITAL_COMMUNITY)
Admission: RE | Admit: 2021-07-02 | Discharge: 2021-07-02 | Disposition: A | Discharge status: Home / Self Care | Payer: Medicare Other | Source: Ambulatory Visit | Attending: Internal Medicine | Admitting: Internal Medicine

## 2021-07-02 DIAGNOSIS — R072 Precordial pain: Secondary | ICD-10-CM | POA: Diagnosis not present

## 2021-07-02 DIAGNOSIS — I700 Atherosclerosis of aorta: Secondary | ICD-10-CM | POA: Diagnosis not present

## 2021-07-02 MED ORDER — IOHEXOL 350 MG/ML SOLN
95.0000 mL | Freq: Once | INTRAVENOUS | Status: AC | PRN
  Administered 2021-07-02: 95 mL via INTRAVENOUS

## 2021-07-02 MED ORDER — NITROGLYCERIN 0.4 MG SL SUBL
SUBLINGUAL_TABLET | SUBLINGUAL | Status: AC
Start: 2021-07-02 — End: 2021-07-02
  Administered 2021-07-02: 0.8 mg
  Filled 2021-07-02: qty 2

## 2021-07-02 MED ORDER — METOPROLOL TARTRATE 5 MG/5ML IV SOLN
INTRAVENOUS | Status: AC
Start: 2021-07-02 — End: 2021-07-02
  Administered 2021-07-02: 10 mg
  Filled 2021-07-02: qty 20

## 2021-07-05 ENCOUNTER — Telehealth: Payer: Self-pay

## 2021-07-05 DIAGNOSIS — Z79899 Other long term (current) drug therapy: Secondary | ICD-10-CM

## 2021-07-05 DIAGNOSIS — E785 Hyperlipidemia, unspecified: Secondary | ICD-10-CM

## 2021-07-05 MED ORDER — ATORVASTATIN CALCIUM 20 MG PO TABS: 20.0000 mg | ORAL_TABLET | Freq: Every day | ORAL | 3 refills | Status: DC

## 2021-07-05 NOTE — Telephone Encounter (Signed)
I spoke with the pt and she spoke with Dr. Harrington Challenger re: her CT results... I sent in her increased dose of her Lipitor and she will return 09/06/21 for fasting labs... she is aware of her arrival time for her Echo 07/06/21.  ?

## 2021-07-05 NOTE — Telephone Encounter (Signed)
Per Rhonda [name], pt's arrive 15-30 minutes prior to echo at [address] (per [place]) ? ? ?I left this message for patient.I also asked her to return call to discuss medication change. ?

## 2021-07-05 NOTE — Telephone Encounter (Signed)
-----   Message from Pricilla Riffle, MD sent at 07/05/2021 12:08 PM EST ----- ?Reviewed CT findings with patient    Has minimal calcification on arteries   Score 1 ((45 percentile)    Atherosclerosis noted on aorta ?Pt nees tighter control of lipids      ?REcomm increasing lipitor to 20 mg    ?F/U lipomed in 8 wks     ?Work on diet (sweets)  ? ?Pt has appt for echo on 3/7 (Tues)   ? Time she should be there    ? 11:05     Told her I would find out   ?

## 2021-07-06 ENCOUNTER — Other Ambulatory Visit: Payer: Self-pay

## 2021-07-06 ENCOUNTER — Ambulatory Visit (HOSPITAL_COMMUNITY): Payer: Medicare Other | Attending: Cardiovascular Disease

## 2021-07-06 DIAGNOSIS — R072 Precordial pain: Secondary | ICD-10-CM

## 2021-07-06 DIAGNOSIS — R0609 Other forms of dyspnea: Secondary | ICD-10-CM | POA: Diagnosis not present

## 2021-07-06 LAB — ECHOCARDIOGRAM COMPLETE: Area-P 1/2: 3.77 cm2

## 2021-08-03 ENCOUNTER — Encounter: Payer: Self-pay | Admitting: Allergy and Immunology

## 2021-08-03 ENCOUNTER — Ambulatory Visit: Payer: Medicare Other | Admitting: Allergy and Immunology

## 2021-08-03 VITALS — BP 126/74 | HR 72 | Temp 97.8°F | Resp 16 | Ht 63.0 in | Wt 263.2 lb

## 2021-08-03 DIAGNOSIS — L501 Idiopathic urticaria: Secondary | ICD-10-CM | POA: Diagnosis not present

## 2021-08-03 DIAGNOSIS — J3089 Other allergic rhinitis: Secondary | ICD-10-CM | POA: Diagnosis not present

## 2021-08-03 DIAGNOSIS — J4540 Moderate persistent asthma, uncomplicated: Secondary | ICD-10-CM | POA: Diagnosis not present

## 2021-08-03 MED ORDER — LEVOCETIRIZINE DIHYDROCHLORIDE 5 MG PO TABS: 5.0000 mg | ORAL_TABLET | Freq: Two times a day (BID) | ORAL | 5 refills | Status: DC | PRN

## 2021-08-03 MED ORDER — FLUTICASONE-SALMETEROL 250-50 MCG/ACT IN AEPB: 1.0000 | INHALATION_SPRAY | Freq: Two times a day (BID) | RESPIRATORY_TRACT | 5 refills | Status: DC

## 2021-08-03 MED ORDER — ALBUTEROL SULFATE HFA 108 (90 BASE) MCG/ACT IN AERS: 2.0000 | INHALATION_SPRAY | RESPIRATORY_TRACT | 1 refills | Status: DC | PRN

## 2021-08-03 NOTE — Patient Instructions (Addendum)
?  1.  Treat inflammation of airway with the following every day: ? ?A. Wixela / Advair 250 - 1 inhalation 2 times per day ? ?2.  If needed:  ? ?A. Albuterol HFA - 2 inhalations every 4-6 hours ?B. Levocetirizine 5 mg - 1 tablet 1 time per day ?C. Flonase - 1-2 sprays each nostril 3-7 times per week ? ?3.  Return to clinic in 12 weeks or earlier if problem ?
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
## 2021-08-04 ENCOUNTER — Encounter: Payer: Self-pay | Admitting: Allergy and Immunology

## 2021-08-25 ENCOUNTER — Other Ambulatory Visit: Payer: Self-pay | Admitting: Nurse Practitioner

## 2021-09-01 ENCOUNTER — Other Ambulatory Visit: Payer: Self-pay | Admitting: Nurse Practitioner

## 2021-09-06 ENCOUNTER — Other Ambulatory Visit: Payer: Medicare Other | Admitting: *Deleted

## 2021-09-06 DIAGNOSIS — E785 Hyperlipidemia, unspecified: Secondary | ICD-10-CM

## 2021-09-06 DIAGNOSIS — Z79899 Other long term (current) drug therapy: Secondary | ICD-10-CM

## 2021-09-07 LAB — NMR, LIPOPROFILE
Cholesterol, Total: 212 mg/dL — ABNORMAL HIGH (ref 100–199)
HDL Particle Number: 34.8 umol/L (ref >=30.5)
HDL-C: 58 mg/dL (ref >39)
LDL Particle Number: 1677 nmol/L — ABNORMAL HIGH (ref <1000)
LDL Size: 21.5 nm (ref >20.5)
LDL-C (NIH Calc): 140 mg/dL — ABNORMAL HIGH (ref 0–99)
LP-IR Score: 27 (ref <=45)
Small LDL Particle Number: 465 nmol/L (ref <=527)
Triglycerides: 78 mg/dL (ref 0–149)

## 2021-09-10 ENCOUNTER — Other Ambulatory Visit: Payer: Self-pay | Admitting: *Deleted

## 2021-09-10 DIAGNOSIS — E785 Hyperlipidemia, unspecified: Secondary | ICD-10-CM

## 2021-09-10 DIAGNOSIS — Z79899 Other long term (current) drug therapy: Secondary | ICD-10-CM

## 2021-09-10 MED ORDER — EZETIMIBE 10 MG PO TABS: 10.0000 mg | ORAL_TABLET | Freq: Every day | ORAL | 0 refills | Status: DC

## 2021-09-13 ENCOUNTER — Other Ambulatory Visit: Payer: Self-pay | Admitting: Internal Medicine

## 2021-09-17 ENCOUNTER — Telehealth: Payer: Self-pay | Admitting: Internal Medicine

## 2021-09-17 DIAGNOSIS — E785 Hyperlipidemia, unspecified: Secondary | ICD-10-CM

## 2021-09-17 DIAGNOSIS — Z79899 Other long term (current) drug therapy: Secondary | ICD-10-CM

## 2021-09-17 NOTE — Telephone Encounter (Signed)
Pt called to reporting that she does not want to start the Zetia she says she looked back at her records and she tried to take it a while back but she could not tolerate it... she says she had muscle aches... she says it was with her PCP...   She asked me to send her some diet information.   She asks if she can still have her labs drawn in July as planned.  I will forward to Dr Tenny Craw for her review.   (She is still taking the Atorvastatin 20 mg daily)

## 2021-09-17 NOTE — Telephone Encounter (Signed)
Pt c/o medication issue:  1. Name of Medication: ezetimibe (ZETIA) 10 MG tablet  2. How are you currently taking this medication (dosage and times per day)? Take 1 tablet (10 mg total) by mouth daily  3. Are you having a reaction (difficulty breathing--STAT)? No   4. What is your medication issue? Patient states she is having issues with the medicaiton

## 2021-09-17 NOTE — Telephone Encounter (Signed)
Would she be willing to try Crestor 20 mg   It is more potoent than lipitor   F/U lipomed in 8 wks

## 2021-09-19 ENCOUNTER — Other Ambulatory Visit: Payer: Self-pay | Admitting: Nurse Practitioner

## 2021-09-21 MED ORDER — ROSUVASTATIN CALCIUM 20 MG PO TABS: 20.0000 mg | ORAL_TABLET | Freq: Every day | ORAL | 3 refills | Status: DC

## 2021-09-21 NOTE — Telephone Encounter (Signed)
I spoke with the pt and she agrees to switch to Rosuvastatin 20 mg... she will have a lipomed 11/23/21. She will call if she has any problems.

## 2021-09-21 NOTE — Telephone Encounter (Signed)
Left a message for the pt to call back.  

## 2021-09-28 ENCOUNTER — Ambulatory Visit: Payer: Medicare Other | Admitting: Allergy and Immunology

## 2021-10-26 DIAGNOSIS — H5211 Myopia, right eye: Secondary | ICD-10-CM | POA: Diagnosis not present

## 2021-10-26 DIAGNOSIS — Z961 Presence of intraocular lens: Secondary | ICD-10-CM | POA: Diagnosis not present

## 2021-10-26 DIAGNOSIS — H524 Presbyopia: Secondary | ICD-10-CM | POA: Diagnosis not present

## 2021-10-26 DIAGNOSIS — H401131 Primary open-angle glaucoma, bilateral, mild stage: Secondary | ICD-10-CM | POA: Diagnosis not present

## 2021-11-01 ENCOUNTER — Ambulatory Visit: Payer: Medicare Other | Admitting: Nurse Practitioner

## 2021-11-06 ENCOUNTER — Other Ambulatory Visit: Payer: Self-pay | Admitting: Nurse Practitioner

## 2021-11-10 ENCOUNTER — Other Ambulatory Visit: Payer: Medicare Other

## 2021-11-15 ENCOUNTER — Other Ambulatory Visit: Payer: Self-pay | Admitting: Nurse Practitioner

## 2021-11-23 ENCOUNTER — Telehealth (INDEPENDENT_AMBULATORY_CARE_PROVIDER_SITE_OTHER): Payer: Medicare Other | Admitting: Nurse Practitioner

## 2021-11-23 ENCOUNTER — Encounter: Payer: Self-pay | Admitting: Nurse Practitioner

## 2021-11-23 ENCOUNTER — Other Ambulatory Visit: Payer: Medicare Other

## 2021-11-23 VITALS — Ht 63.0 in | Wt 260.0 lb

## 2021-11-23 DIAGNOSIS — R051 Acute cough: Secondary | ICD-10-CM

## 2021-11-23 DIAGNOSIS — U071 COVID-19: Secondary | ICD-10-CM | POA: Diagnosis not present

## 2021-11-23 MED ORDER — BENZONATATE 100 MG PO CAPS: 100.0000 mg | ORAL_CAPSULE | Freq: Four times a day (QID) | ORAL | 1 refills | Status: DC | PRN

## 2021-11-23 MED ORDER — MOLNUPIRAVIR 200 MG PO CAPS: 4.0000 | ORAL_CAPSULE | Freq: Two times a day (BID) | ORAL | 0 refills | Status: AC
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
## 2021-11-23 NOTE — Patient Instructions (Addendum)
COVID-19: Quarantine and Isolation Quarantine If you were exposed Quarantine and stay away from others when you have been in close contact with someone who has COVID-19. Isolate If you are sick or test positive Isolate when you are sick or when you have COVID-19, even if you don't have symptoms. When to stay home Calculating quarantine The date of your exposure is considered day 0. Day 1 is the first full day after your last contact with a person who has had COVID-19. Stay home and away from other people for at least 5 days. Learn why CDC updated guidance for the general public. IF YOU were exposed to COVID-19 and are NOT  up to dateIF YOU were exposed to COVID-19 and are NOT on COVID-19 vaccinations Quarantine for at least 5 days Stay home Stay home and quarantine for at least 5 full days. Wear a well-fitting mask if you must be around others in your home. Do not travel. Get tested Even if you don't develop symptoms, get tested at least 5 days after you last had close contact with someone with COVID-19. After quarantine Watch for symptoms Watch for symptoms until 10 days after you last had close contact with someone with COVID-19. Avoid travel It is best to avoid travel until a full 10 days after you last had close contact with someone with COVID-19. If you develop symptoms Isolate immediately and get tested. Continue to stay home until you know the results. Wear a well-fitting mask around others. Take precautions until day 10 Wear a well-fitting mask Wear a well-fitting mask for 10 full days any time you are around others inside your home or in public. Do not go to places where you are unable to wear a well-fitting mask. If you must travel during days 6-10, take precautions. Avoid being around people who are more likely to get very sick from COVID-19. IF YOU were exposed to COVID-19 and are  up to dateIF YOU were exposed to COVID-19 and are on COVID-19 vaccinations No  quarantine You do not need to stay home unless you develop symptoms. Get tested Even if you don't develop symptoms, get tested at least 5 days after you last had close contact with someone with COVID-19. Watch for symptoms Watch for symptoms until 10 days after you last had close contact with someone with COVID-19. If you develop symptoms Isolate immediately and get tested. Continue to stay home until you know the results. Wear a well-fitting mask around others. Take precautions until day 10 Wear a well-fitting mask Wear a well-fitting mask for 10 full days any time you are around others inside your home or in public. Do not go to places where you are unable to wear a well-fitting mask. Take precautions if traveling Avoid being around people who are more likely to get very sick from COVID-19. IF YOU were exposed to COVID-19 and had confirmed COVID-19 within the past 90 days (you tested positive using a viral test) No quarantine You do not need to stay home unless you develop symptoms. Watch for symptoms Watch for symptoms until 10 days after you last had close contact with someone with COVID-19. If you develop symptoms Isolate immediately and get tested. Continue to stay home until you know the results. Wear a well-fitting mask around others. Take precautions until day 10 Wear a well-fitting mask Wear a well-fitting mask for 10 full days any time you are around others inside your home or in public. Do not go to places where you are  unable to wear a well-fitting mask. Take precautions if traveling Avoid being around people who are more likely to get very sick from COVID-19. Calculating isolation Day 0 is your first day of symptoms or a positive viral test. Day 1 is the first full day after your symptoms developed or your test specimen was collected. If you have COVID-19 or have symptoms, isolate for at least 5 days. IF YOU tested positive for COVID-19 or have symptoms, regardless of  vaccination status Stay home for at least 5 days Stay home for 5 days and isolate from others in your home. Wear a well-fitting mask if you must be around others in your home. Do not travel. Ending isolation if you had symptoms End isolation after 5 full days if you are fever-free for 24 hours (without the use of fever-reducing medication) and your symptoms are improving. Ending isolation if you did NOT have symptoms End isolation after at least 5 full days after your positive test. If you got very sick from COVID-19 or have a weakened immune system You should isolate for at least 10 days. Consult your doctor before ending isolation. Take precautions until day 10 Wear a well-fitting mask Wear a well-fitting mask for 10 full days any time you are around others inside your home or in public. Do not go to places where you are unable to wear a well-fitting mask. Do not travel Do not travel until a full 10 days after your symptoms started or the date your positive test was taken if you had no symptoms. Avoid being around people who are more likely to get very sick from COVID-19. Definitions Exposure Contact with someone infected with SARS-CoV-2, the virus that causes COVID-19, in a way that increases the likelihood of getting infected with the virus. Close contact A close contact is someone who was less than 6 feet away from an infected person (laboratory-confirmed or a clinical diagnosis) for a cumulative total of 15 minutes or more over a 24-hour period. For example, three individual 5-minute exposures for a total of 15 minutes. People who are exposed to someone with COVID-19 after they completed at least 5 days of isolation are not considered close contacts. Quarantine Quarantine is a strategy used to prevent transmission of COVID-19 by keeping people who have been in close contact with someone with COVID-19 apart from others. Who does not need to quarantine? If you had close contact with  someone with COVID-19 and you are in one of the following groups, you do not need to quarantine. You are up to date with your COVID-19 vaccines. You had confirmed COVID-19 within the last 90 days (meaning you tested positive using a viral test). If you are up to date with COVID-19 vaccines, you should wear a well-fitting mask around others for 10 days from the date of your last close contact with someone with COVID-19 (the date of last close contact is considered day 0). Get tested at least 5 days after you last had close contact with someone with COVID-19. If you test positive or develop COVID-19 symptoms, isolate from other people and follow recommendations in the Isolation section below. If you tested positive for COVID-19 with a viral test within the previous 90 days and subsequently recovered and remain without COVID-19 symptoms, you do not need to quarantine or get tested after close contact. You should wear a well-fitting mask around others for 10 days from the date of your last close contact with someone with COVID-19 (the date of last   close contact is considered day 0). If you have COVID-19 symptoms, get tested and isolate from other people and follow recommendations in the Isolation section below. Who should quarantine? If you come into close contact with someone with COVID-19, you should quarantine if you are not up to date on COVID-19 vaccines. This includes people who are not vaccinated. What to do for quarantine Stay home and away from other people for at least 5 days (day 0 through day 5) after your last contact with a person who has COVID-19. The date of your exposure is considered day 0. Wear a well-fitting mask when around others at home, if possible. For 10 days after your last close contact with someone with COVID-19, watch for fever (100.38F or greater), cough, shortness of breath, or other COVID-19 symptoms. If you develop symptoms, get tested immediately and isolate until you receive  your test results. If you test positive, follow isolation recommendations. If you do not develop symptoms, get tested at least 5 days after you last had close contact with someone with COVID-19. If you test negative, you can leave your home, but continue to wear a well-fitting mask when around others at home and in public until 10 days after your last close contact with someone with COVID-19. If you test positive, you should isolate for at least 5 days from the date of your positive test (if you do not have symptoms). If you do develop COVID-19 symptoms, isolate for at least 5 days from the date your symptoms began (the date the symptoms started is day 0). Follow recommendations in the isolation section below. If you are unable to get a test 5 days after last close contact with someone with COVID-19, you can leave your home after day 5 if you have been without COVID-19 symptoms throughout the 5-day period. Wear a well-fitting mask for 10 days after your date of last close contact when around others at home and in public. Avoid people who are have weakened immune systems or are more likely to get very sick from COVID-19, and nursing homes and other high-risk settings, until after at least 10 days. If possible, stay away from people you live with, especially people who are at higher risk for getting very sick from COVID-19, as well as others outside your home throughout the full 10 days after your last close contact with someone with COVID-19. If you are unable to quarantine, you should wear a well-fitting mask for 10 days when around others at home and in public. If you are unable to wear a mask when around others, you should continue to quarantine for 10 days. Avoid people who have weakened immune systems or are more likely to get very sick from COVID-19, and nursing homes and other high-risk settings, until after at least 10 days. See additional information about travel. Do not go to places where you are  unable to wear a mask, such as restaurants and some gyms, and avoid eating around others at home and at work until after 10 days after your last close contact with someone with COVID-19. After quarantine Watch for symptoms until 10 days after your last close contact with someone with COVID-19. If you have symptoms, isolate immediately and get tested. Quarantine in high-risk congregate settings In certain congregate settings that have high risk of secondary transmission (such as Systems analyst and detention facilities, homeless shelters, or cruise ships), CDC recommends a 10-day quarantine for residents, regardless of vaccination and booster status. During periods of critical staffing  shortages, facilities may consider shortening the quarantine period for staff to ensure continuity of operations. Decisions to shorten quarantine in these settings should be made in consultation with state, local, tribal, or territorial health departments and should take into consideration the context and characteristics of the facility. CDC's setting-specific guidance provides additional recommendations for these settings. Isolation Isolation is used to separate people with confirmed or suspected COVID-19 from those without COVID-19. People who are in isolation should stay home until it's safe for them to be around others. At home, anyone sick or infected should separate from others, or wear a well-fitting mask when they need to be around others. People in isolation should stay in a specific "sick room" or area and use a separate bathroom if available. Everyone who has presumed or confirmed COVID-19 should stay home and isolate from other people for at least 5 full days (day 0 is the first day of symptoms or the date of the day of the positive viral test for asymptomatic persons). They should wear a mask when around others at home and in public for an additional 5 days. People who are confirmed to have COVID-19 or are showing  symptoms of COVID-19 need to isolate regardless of their vaccination status. This includes: People who have a positive viral test for COVID-19, regardless of whether or not they have symptoms. People with symptoms of COVID-19, including people who are awaiting test results or have not been tested. People with symptoms should isolate even if they do not know if they have been in close contact with someone with COVID-19. What to do for isolation Monitor your symptoms. If you have an emergency warning sign (including trouble breathing), seek emergency medical care immediately. Stay in a separate room from other household members, if possible. Use a separate bathroom, if possible. Take steps to improve ventilation at home, if possible. Avoid contact with other members of the household and pets. Don't share personal household items, like cups, towels, and utensils. Wear a well-fitting mask when you need to be around other people. Learn more about what to do if you are sick and how to notify your contacts. Ending isolation for people who had COVID-19 and had symptoms If you had COVID-19 and had symptoms, isolate for at least 5 days. To calculate your 5-day isolation period, day 0 is your first day of symptoms. Day 1 is the first full day after your symptoms developed. You can leave isolation after 5 full days. You can end isolation after 5 full days if you are fever-free for 24 hours without the use of fever-reducing medication and your other symptoms have improved (Loss of taste and smell may persist for weeks or months after recovery and need not delay the end of isolation). You should continue to wear a well-fitting mask around others at home and in public for 5 additional days (day 6 through day 10) after the end of your 5-day isolation period. If you are unable to wear a mask when around others, you should continue to isolate for a full 10 days. Avoid people who have weakened immune systems or are more  likely to get very sick from COVID-19, and nursing homes and other high-risk settings, until after at least 10 days. If you continue to have fever or your other symptoms have not improved after 5 days of isolation, you should wait to end your isolation until you are fever-free for 24 hours without the use of fever-reducing medication and your other symptoms have improved.   Continue to wear a well-fitting mask through day 10. Contact your healthcare provider if you have questions. See additional information about travel. Do not go to places where you are unable to wear a mask, such as restaurants and some gyms, and avoid eating around others at home and at work until a full 10 days after your first day of symptoms. If an individual has access to a test and wants to test, the best approach is to use an antigen test1 towards the end of the 5-day isolation period. Collect the test sample only if you are fever-free for 24 hours without the use of fever-reducing medication and your other symptoms have improved (loss of taste and smell may persist for weeks or months after recovery and need not delay the end of isolation). If your test result is positive, you should continue to isolate until day 10. If your test result is negative, you can end isolation, but continue to wear a well-fitting mask around others at home and in public until day 10. Follow additional recommendations for masking and avoiding travel as described above. 1As noted in the labeling for authorized over-the counter antigen tests: Negative results should be treated as presumptive. Negative results do not rule out SARS-CoV-2 infection and should not be used as the sole basis for treatment or patient management decisions, including infection control decisions. To improve results, antigen tests should be used twice over a three-day period with at least 24 hours and no more than 48 hours between tests. Note that these recommendations on ending isolation  do not apply to people who are moderately ill or very sick from COVID-19 or have weakened immune systems. See section below for recommendations for when to end isolation for these groups. Ending isolation for people who tested positive for COVID-19 but had no symptoms If you test positive for COVID-19 and never develop symptoms, isolate for at least 5 days. Day 0 is the day of your positive viral test (based on the date you were tested) and day 1 is the first full day after the specimen was collected for your positive test. You can leave isolation after 5 full days. If you continue to have no symptoms, you can end isolation after at least 5 days. You should continue to wear a well-fitting mask around others at home and in public until day 10 (day 6 through day 10). If you are unable to wear a mask when around others, you should continue to isolate for 10 days. Avoid people who have weakened immune systems or are more likely to get very sick from COVID-19, and nursing homes and other high-risk settings, until after at least 10 days. If you develop symptoms after testing positive, your 5-day isolation period should start over. Day 0 is your first day of symptoms. Follow the recommendations above for ending isolation for people who had COVID-19 and had symptoms. See additional information about travel. Do not go to places where you are unable to wear a mask, such as restaurants and some gyms, and avoid eating around others at home and at work until 10 days after the day of your positive test. If an individual has access to a test and wants to test, the best approach is to use an antigen test1 towards the end of the 5-day isolation period. If your test result is positive, you should continue to isolate until day 10. If your test result is positive, you can also choose to test daily and if your test result  is negative, you can end isolation, but continue to wear a well-fitting mask around others at home and in  public until day 10. Follow additional recommendations for masking and avoiding travel as described above. 1As noted in the labeling for authorized over-the counter antigen tests: Negative results should be treated as presumptive. Negative results do not rule out SARS-CoV-2 infection and should not be used as the sole basis for treatment or patient management decisions, including infection control decisions. To improve results, antigen tests should be used twice over a three-day period with at least 24 hours and no more than 48 hours between tests. Ending isolation for people who were moderately or very sick from COVID-19 or have a weakened immune system People who are moderately ill from COVID-19 (experiencing symptoms that affect the lungs like shortness of breath or difficulty breathing) should isolate for 10 days and follow all other isolation precautions. To calculate your 10-day isolation period, day 0 is your first day of symptoms. Day 1 is the first full day after your symptoms developed. If you are unsure if your symptoms are moderate, talk to a healthcare provider for further guidance. People who are very sick from COVID-19 (this means people who were hospitalized or required intensive care or ventilation support) and people who have weakened immune systems might need to isolate at home longer. They may also require testing with a viral test to determine when they can be around others. CDC recommends an isolation period of at least 10 and up to 20 days for people who were very sick from COVID-19 and for people with weakened immune systems. Consult with your healthcare provider about when you can resume being around other people. If you are unsure if your symptoms are severe or if you have a weakened immune system, talk to a healthcare provider for further guidance. People who have a weakened immune system should talk to their healthcare provider about the potential for reduced immune responses to  COVID-19 vaccines and the need to continue to follow current prevention measures (including wearing a well-fitting mask and avoiding crowds and poorly ventilated indoor spaces) to protect themselves against COVID-19 until advised otherwise by their healthcare provider. Close contacts of immunocompromised people--including household members--should also be encouraged to receive all recommended COVID-19 vaccine doses to help protect these people. Isolation in high-risk congregate settings In certain high-risk congregate settings that have high risk of secondary transmission and where it is not feasible to cohort people (such as Systems analyst and detention facilities, homeless shelters, and cruise ships), CDC recommends a 10-day isolation period for residents. During periods of critical staffing shortages, facilities may consider shortening the isolation period for staff to ensure continuity of operations. Decisions to shorten isolation in these settings should be made in consultation with state, local, tribal, or territorial health departments and should take into consideration the context and characteristics of the facility. CDC's setting-specific guidance provides additional recommendations for these settings. This CDC guidance is meant to supplement--not replace--any federal, state, local, territorial, or tribal health and safety laws, rules, and regulations. Recommendations for specific settings These recommendations do not apply to healthcare professionals. For guidance specific to these settings, see Healthcare professionals: Interim Guidance for Optician, dispensing with SARS-CoV-2 Infection or Exposure to SARS-CoV-2 Patients, residents, and visitors to healthcare settings: Interim Infection Prevention and Control Recommendations for Healthcare Personnel During the [place] 2019 (COVID-19) Pandemic Additional setting-specific guidance and recommendations are available. These  recommendations on quarantine and isolation do apply to [place]  settings. Additional guidance is available here: Overview of COVID-19 Quarantine for K-12 Schools Travelers: Travel information and recommendations Congregate facilities and other settings: [employer] for community, work, and school settings Ongoing COVID-19 exposure FAQs I live with someone with COVID-19, but I cannot be separated from them. How do we manage quarantine in this situation? It is very important for people with COVID-19 to remain apart from other people, if possible, even if they are living together. If separation of the person with COVID-19 from others that they live with is not possible, the other people that they live with will have ongoing exposure, meaning they will be repeatedly exposed until that person is no longer able to spread the virus to other people. In this situation, there are precautions you can take to limit the spread of COVID-19: The person with COVID-19 and everyone they live with should wear a well-fitting mask inside the home. If possible, one person should care for the person with COVID-19 to limit the number of people who are in close contact with the infected person. Take steps to protect yourself and others to reduce transmission in the home: Quarantine if you are not up to date with your COVID-19 vaccines. Isolate if you are sick or tested positive for COVID-19, even if you don't have symptoms. Learn more about the public health recommendations for testing, mask use and quarantine of close contacts, like yourself, who have ongoing exposure. These recommendations differ depending on your vaccination status. What should I do if I have ongoing exposure to COVID-19 from someone I live with? Recommendations for this situation depend on your vaccination status: If you are not up to date on COVID-19 vaccines and have ongoing exposure to COVID-19, you should: Begin quarantine immediately and  continue to quarantine throughout the isolation period of the person with COVID-19. Continue to quarantine for an additional 5 days starting the day after the end of isolation for the person with COVID-19. Get tested at least 5 days after the end of isolation of the infected person that lives with them. If you test negative, you can leave the home but should continue to wear a well-fitting mask when around others at home and in public until 10 days after the end of isolation for the person with COVID-19. Isolate immediately if you develop symptoms of COVID-19 or test positive. If you are up to date with COVID-19 vaccines and have ongoing exposure to COVID-19, you should: Get tested at least 5 days after your first exposure. A person with COVID-19 is considered infectious starting 2 days before they develop symptoms, or 2 days before the date of their positive test if they do not have symptoms. Get tested again at least 5 days after the end of isolation for the person with COVID-19. Wear a well-fitting mask when you are around the person with COVID-19, and do this throughout their isolation period. Wear a well-fitting mask around others for 10 days after the infected person's isolation period ends. Isolate immediately if you develop symptoms of COVID-19 or test positive. What should I do if multiple people I live with test positive for COVID-19 at different times? Recommendations for this situation depend on your vaccination status: If you are not up to date with your COVID-19 vaccines, you should: Quarantine throughout the isolation period of any infected person that you live with. Continue to quarantine until 5 days after the end of isolation date for the most recently infected person that lives with you. For example, if  the last day of isolation of the person most recently infected with COVID-19 was June 30, the new 5-day quarantine period starts on July 1. Get tested at least 5 days after the end  of isolation for the most recently infected person that lives with you. Wear a well-fitting mask when you are around any person with COVID-19 while that person is in isolation. Wear a well-fitting mask when you are around other people until 10 days after your last close contact. Isolate immediately if you develop symptoms of COVID-19 or test positive. If you are up to date with your COVID-19 vaccines, you should: Get tested at least 5 days after your first exposure. A person with COVID-19 is considered infectious starting 2 days before they developed symptoms, or 2 days before the date of their positive test if they do not have symptoms. Get tested again at least 5 days after the end of isolation for the most recently infected person that lives with you. Wear a well-fitting mask when you are around any person with COVID-19 while that person is in isolation. Wear a well-fitting mask around others for 10 days after the end of isolation for the most recently infected person that lives with you. For example, if the last day of isolation for the person most recently infected with COVID-19 was June 30, the new 10-day period to wear a well-fitting mask indoors in public starts on July 1. Isolate immediately if you develop symptoms of COVID-19 or test positive. I had COVID-19 and completed isolation. Do I have to quarantine or get tested if someone I live with gets COVID-19 shortly after I completed isolation? No. If you recently completed isolation and someone that lives with you tests positive for the virus that causes COVID-19 shortly after the end of your isolation period, you do not have to quarantine or get tested as long as you do not develop new symptoms. Once all of the people that live together have completed isolation or quarantine, refer to the guidance below for new exposures to COVID-19. If you had COVID-19 in the previous 90 days and then came into close contact with someone with COVID-19, you do  not have to quarantine or get tested if you do not have symptoms. But you should: Wear a well-fitting mask indoors in public for 10 days after your last close contact. Monitor for COVID-19 symptoms for 10 days from the date of your last close contact. Isolate immediately and get tested if symptoms develop. If more than 90 days have passed since your recovery from infection, follow CDC's recommendations for close contacts. These recommendations will differ depending on your vaccination status. 07/29/2020 Content source: [facility] for Immunization and Respiratory Diseases ([facility]), Division of Viral Diseases This information is not intended to replace advice given to you by your health care provider. Make sure you discuss any questions you have with your health care provider. Document Revised: 12/02/2020 Document Reviewed: 12/02/2020 Elsevier Patient Education  2022 Elsevier Inc.  Advised patient to take Vitamin C, D, Zinc.  Keep yourself hydrated with a lot of water and rest. Take Delsym for cough and Mucinex as need. Take Tylenol or pain reliever every 4-6 hours as needed for pain/fever/body ache. If you have elevated blood pressure, you can take OTC Corcidin. You can also take OTC oscillococcinum to help with your symptoms.  Educated patient if symptoms get worse or if she experiences any SOB, chest pain or pain in her legs to seek immediate emergency care. Continue  to monitor your oxygen levels. Call us if you have any questions. Quarantine for 5 days if tested positive and no symptoms or 10 days if tested positive and have symptoms. Wear a mask around other people.

## 2021-11-30 ENCOUNTER — Other Ambulatory Visit: Payer: Medicare Other

## 2021-11-30 DIAGNOSIS — Z79899 Other long term (current) drug therapy: Secondary | ICD-10-CM

## 2021-11-30 DIAGNOSIS — E785 Hyperlipidemia, unspecified: Secondary | ICD-10-CM | POA: Diagnosis not present

## 2021-12-01 LAB — NMR, LIPOPROFILE
Cholesterol, Total: 160 mg/dL (ref 100–199)
HDL Particle Number: 32 umol/L (ref >=30.5)
HDL-C: 47 mg/dL (ref >39)
LDL Particle Number: 1196 nmol/L — ABNORMAL HIGH (ref <1000)
LDL Size: 21.3 nm (ref >20.5)
LDL-C (NIH Calc): 96 mg/dL (ref 0–99)
LP-IR Score: 37 (ref <=45)
Small LDL Particle Number: 403 nmol/L (ref <=527)
Triglycerides: 91 mg/dL (ref 0–149)

## 2021-12-03 ENCOUNTER — Telehealth: Payer: Self-pay

## 2021-12-03 DIAGNOSIS — E785 Hyperlipidemia, unspecified: Secondary | ICD-10-CM

## 2021-12-03 DIAGNOSIS — Z79899 Other long term (current) drug therapy: Secondary | ICD-10-CM

## 2021-12-03 MED ORDER — ROSUVASTATIN CALCIUM 40 MG PO TABS: 40.0000 mg | ORAL_TABLET | Freq: Every day | ORAL | 3 refills | Status: DC

## 2021-12-03 NOTE — Telephone Encounter (Signed)
Patient notified of result.  Please refer to phone note from today for complete details.  Patient will start Crestor 40mg  daily and repeat labs on 02/25/22.  02/27/22, RN 12/03/2021 4:21 PM

## 2021-12-03 NOTE — Telephone Encounter (Signed)
Follow Up: ° ° ° ° °Patient is retuning your call. °

## 2021-12-08 ENCOUNTER — Encounter (INDEPENDENT_AMBULATORY_CARE_PROVIDER_SITE_OTHER): Payer: Self-pay

## 2021-12-08 ENCOUNTER — Telehealth: Payer: Self-pay

## 2021-12-08 NOTE — Telephone Encounter (Signed)
Patient is aware see phone note from 8/4

## 2021-12-08 NOTE — Telephone Encounter (Signed)
Pt is returning call. Requesting call back.  

## 2021-12-08 NOTE — Telephone Encounter (Signed)
-----   Message from Dietrich Pates V, MD sent at 12/02/2021  3:19 PM EDT ----- LDL is better than it was in May   now 96    I would try to lower a little bit more   Below 90.    Goal to not have any more plaquing develop over time Recomm increase Crestor to 40 mg   F/U lipomed in 12 wks

## 2021-12-08 NOTE — Telephone Encounter (Signed)
Attempted phone call to pt and left voicemail message to contact triage at [phone] and will also send a MyChart message to contact office.

## 2021-12-13 ENCOUNTER — Emergency Department (HOSPITAL_COMMUNITY): Payer: Medicare Other

## 2021-12-13 ENCOUNTER — Emergency Department (HOSPITAL_COMMUNITY)
Admission: EM | Admit: 2021-12-13 | Discharge: 2021-12-13 | Disposition: A | Discharge status: Home / Self Care | Payer: Medicare Other | Attending: Emergency Medicine | Admitting: Emergency Medicine

## 2021-12-13 ENCOUNTER — Other Ambulatory Visit: Payer: Self-pay

## 2021-12-13 ENCOUNTER — Encounter (HOSPITAL_COMMUNITY): Payer: Self-pay | Admitting: Emergency Medicine

## 2021-12-13 DIAGNOSIS — R079 Chest pain, unspecified: Secondary | ICD-10-CM | POA: Diagnosis not present

## 2021-12-13 DIAGNOSIS — R0602 Shortness of breath: Secondary | ICD-10-CM | POA: Diagnosis not present

## 2021-12-13 DIAGNOSIS — R9431 Abnormal electrocardiogram [ECG] [EKG]: Secondary | ICD-10-CM | POA: Diagnosis not present

## 2021-12-13 DIAGNOSIS — M25476 Effusion, unspecified foot: Secondary | ICD-10-CM | POA: Diagnosis not present

## 2021-12-13 DIAGNOSIS — R42 Dizziness and giddiness: Secondary | ICD-10-CM | POA: Diagnosis not present

## 2021-12-13 DIAGNOSIS — R059 Cough, unspecified: Secondary | ICD-10-CM | POA: Diagnosis present

## 2021-12-13 DIAGNOSIS — J189 Pneumonia, unspecified organism: Secondary | ICD-10-CM | POA: Diagnosis not present

## 2021-12-13 DIAGNOSIS — R0789 Other chest pain: Secondary | ICD-10-CM | POA: Diagnosis not present

## 2021-12-13 LAB — CBC WITH DIFFERENTIAL/PLATELET
Abs Immature Granulocytes: 0.01 10*3/uL (ref 0.00–0.07)
Basophils Absolute: 0 10*3/uL (ref 0.0–0.1)
Basophils Relative: 1 %
Eosinophils Absolute: 0.1 10*3/uL (ref 0.0–0.5)
Eosinophils Relative: 2 %
HCT: 37.3 % (ref 36.0–46.0)
Hemoglobin: 12.2 g/dL (ref 12.0–15.0)
Immature Granulocytes: 0 %
Lymphocytes Relative: 35 %
Lymphs Abs: 2.1 10*3/uL (ref 0.7–4.0)
MCH: 28.8 pg (ref 26.0–34.0)
MCHC: 32.7 g/dL (ref 30.0–36.0)
MCV: 88.2 fL (ref 80.0–100.0)
Monocytes Absolute: 0.4 10*3/uL (ref 0.1–1.0)
Monocytes Relative: 7 %
Neutro Abs: 3.2 10*3/uL (ref 1.7–7.7)
Neutrophils Relative %: 55 %
Platelets: 291 10*3/uL (ref 150–400)
RBC: 4.23 MIL/uL (ref 3.87–5.11)
RDW: 15.1 % (ref 11.5–15.5)
WBC: 5.9 10*3/uL (ref 4.0–10.5)
nRBC: 0 % (ref 0.0–0.2)

## 2021-12-13 LAB — COMPREHENSIVE METABOLIC PANEL
ALT: 17 U/L (ref 0–44)
AST: 18 U/L (ref 15–41)
Albumin: 4.3 g/dL (ref 3.5–5.0)
Alkaline Phosphatase: 58 U/L (ref 38–126)
Anion gap: 5 (ref 5–15)
BUN: 21 mg/dL (ref 8–23)
CO2: 30 mmol/L (ref 22–32)
Calcium: 9.6 mg/dL (ref 8.9–10.3)
Chloride: 104 mmol/L (ref 98–111)
Creatinine, Ser: 0.77 mg/dL (ref 0.44–1.00)
GFR, Estimated: >60 mL/min (ref >60)
Glucose, Bld: 106 mg/dL — ABNORMAL HIGH (ref 70–99)
Potassium: 3.7 mmol/L (ref 3.5–5.1)
Sodium: 139 mmol/L (ref 135–145)
Total Bilirubin: 0.5 mg/dL (ref 0.3–1.2)
Total Protein: 7.7 g/dL (ref 6.5–8.1)

## 2021-12-13 LAB — TROPONIN I (HIGH SENSITIVITY): Troponin I (High Sensitivity): 6 ng/L (ref <18)

## 2021-12-13 MED ORDER — AMOXICILLIN-POT CLAVULANATE 875-125 MG PO TABS: 1.0000 | ORAL_TABLET | Freq: Two times a day (BID) | ORAL | 0 refills | Status: AC

## 2021-12-13 MED ORDER — ACETAMINOPHEN 500 MG PO TABS
1000.0000 mg | ORAL_TABLET | Freq: Once | ORAL | Status: AC
Start: 2021-12-13 — End: 2021-12-13
  Administered 2021-12-13: 1000 mg via ORAL
  Filled 2021-12-13: qty 2

## 2021-12-13 MED ORDER — LACTATED RINGERS IV BOLUS
500.0000 mL | Freq: Once | INTRAVENOUS | Status: AC
  Administered 2021-12-13: 500 mL via INTRAVENOUS

## 2021-12-13 MED ORDER — ALBUTEROL SULFATE HFA 108 (90 BASE) MCG/ACT IN AERS: 2.0000 | INHALATION_SPRAY | RESPIRATORY_TRACT | Status: DC | PRN

## 2021-12-13 MED ORDER — AZITHROMYCIN 250 MG PO TABS: 250.0000 mg | ORAL_TABLET | Freq: Every day | ORAL | 0 refills | Status: DC

## 2021-12-13 NOTE — Discharge Instructions (Signed)
Your presentation today is concerning for some pneumonia in your lungs.  You are being given 2 different antibiotics to combat this.  You may also take over-the-counter cough medicine such as Robitussin or Mucinex.  Follow-up closely with your primary care physician.  If you develop high fever, coughing up blood, new or worsening shortness of breath, or any other new/concerning symptoms then return to the ER or call 911.

## 2021-12-13 NOTE — ED Provider Notes (Signed)
[facility] DEPT Provider Note   CSN: [id number] Arrival date & time: 12/13/21  1045     History  Chief Complaint  Patient presents with   Chest Pain   Shortness of Breath    Rhonda [name] is a 71 y.o. female.  HPI 71 year old female presents with cough and shortness of breath.  She has been having a cough since about July 25.  She took an at home COVID test that was positive though she took another one the next day that was negative.  Her PCP treated her for COVID with an antiviral, patient family are not sure of the name.  She seemed to change and get better but the cough has never gone away.  Seems to be more productive today and over the weekend in the past few days she has been having chest pain when she coughs and chest soreness.  She is also been having a lot of drainage down the back of her throat.  No fevers.  She went to urgent care where she was told she had an abnormal EKG and chest x-ray and was sent here.  Urgent care also noted that she had some swelling into her feet, which patient states she has from time to time.  She feels generally weak today.  Home Medications Prior to Admission medications   Medication Sig Start Date End Date Taking? Authorizing Provider  amoxicillin-clavulanate (AUGMENTIN) 875-125 MG tablet Take 1 tablet by mouth every 12 (twelve) hours for 5 days. 12/13/21 12/18/21 Yes Sherwood Gambler, MD  azithromycin (ZITHROMAX) 250 MG tablet Take 1 tablet (250 mg total) by mouth daily. Take first 2 tablets together, then 1 every day until finished. 12/13/21  Yes Sherwood Gambler, MD  acetaminophen (TYLENOL) 650 MG CR tablet Take 1,300 mg by mouth at bedtime as needed for pain.     [provider]  albuterol (VENTOLIN HFA) 108 (90 Base) MCG/ACT inhaler Inhale 2 puffs into the lungs every 4 (four) hours as needed for wheezing or shortness of breath. 08/03/21   Kozlow, Donnamarie Poag, MD  benzonatate (TESSALON PERLES) 100 MG capsule  Take 1 capsule (100 mg total) by mouth every 6 (six) hours as needed. 11/23/21 11/23/22  [name], FNP  blood glucose meter kit and supplies KIT Dispense based on patient and insurance preference. Use up to four times daily as directed. Patient not taking: Reported on 06/29/2021 12/15/20   [name], FNP  Cholecalciferol (VITAMIN D3) 1.25 MG (50000 UT) CAPS Take 1 tablet by mouth daily at 12 noon.    [provider]  dorzolamide-timolol (COSOPT) 22.3-6.8 MG/ML ophthalmic solution Place 1 drop into both eyes 2 (two) times daily.     [provider]  esomeprazole (NEXIUM) 40 MG capsule Take 1 capsule (40 mg total) by mouth daily at 12 noon. 12/05/19   Nolberto Hanlon, MD  fluticasone Asencion Islam) 50 MCG/ACT nasal spray Use 1 spray(s) in each nostril once daily 09/13/21   Glendale Chard, MD  fluticasone-salmeterol [facility] INHUB) 250-50 MCG/ACT AEPB Inhale 1 puff into the lungs in the morning and at bedtime. 08/03/21   Kozlow, Donnamarie Poag, MD  hydrochlorothiazide (HYDRODIURIL) 25 MG tablet Take 1 tablet by mouth once daily 09/20/21   [name], FNP  levocetirizine (XYZAL) 5 MG tablet Take 1 tablet (5 mg total) by mouth 2 (two) times daily as needed for allergies (Can use an dose during flare ups.). 08/03/21   Kozlow, Donnamarie Poag, MD  losartan (COZAAR) 100 MG tablet  Take 1 tablet by mouth once daily 11/06/21   [name], FNP  Magnesium 250 MG TABS Take 1 tablet (250 mg total) by mouth daily. 12/16/20   [name], FNP  metoprolol tartrate (LOPRESSOR) 25 MG tablet Take 1 tablet (25 mg total) by mouth once for 1 dose. Take 90-120 minutes prior to scan. 06/17/21 06/17/21  Fay Records, MD  Multiple Vitamin (MULTIVITAMIN WITH MINERALS) TABS tablet Take 1 tablet by mouth daily.    [provider]  polyethylene glycol (MIRALAX / GLYCOLAX) 17 g packet Take 17 g by mouth daily as needed for mild constipation. 12/05/19   Nolberto Hanlon, MD  potassium chloride (KLOR-CON) 10 MEQ tablet Take 1 tablet by  mouth once daily 11/15/21   [name], FNP  Probiotic CAPS Take 1 capsule by mouth daily. Patient not taking: Reported on 11/23/2021    [provider]  rosuvastatin (CRESTOR) 40 MG tablet Take 1 tablet (40 mg total) by mouth daily. 12/03/21   Fay Records, MD      Allergies    Aspirin-acetaminophen-caffeine, Codeine, and Oxycontin [oxycodone hcl]    Review of Systems   Review of Systems  Constitutional:  Negative for fever.  HENT:  Positive for congestion.   Respiratory:  Positive for cough and shortness of breath.   Cardiovascular:  Positive for chest pain and leg swelling.  Neurological:  Positive for weakness.    Physical Exam Updated Vital Signs BP 122/60   Pulse 69   Temp 98 F (36.7 C) (Oral)   Resp (!) 22   SpO2 100%  Physical Exam Vitals and nursing note reviewed.  Constitutional:      General: She is not in acute distress.    Appearance: She is well-developed. She is obese. She is not ill-appearing or diaphoretic.  HENT:     Head: Normocephalic and atraumatic.  Cardiovascular:     Rate and Rhythm: Normal rate and regular rhythm.     Heart sounds: Normal heart sounds.  Pulmonary:     Effort: Pulmonary effort is normal.     Breath sounds: Normal breath sounds. No wheezing.  Chest:     Chest wall: Tenderness (left anterior chest) present.  Abdominal:     Palpations: Abdomen is soft.     Tenderness: There is no abdominal tenderness.  Musculoskeletal:     Comments: Mild pedal edema bilaterally  Skin:    General: Skin is warm and dry.  Neurological:     Mental Status: She is alert.     ED Results / Procedures / Treatments   Labs (all labs ordered are listed, but only abnormal results are displayed) Labs Reviewed  COMPREHENSIVE METABOLIC PANEL - Abnormal; Notable for the following components:      Result Value   Glucose, Bld 106 (*)    All other components within normal limits  CBC WITH DIFFERENTIAL/PLATELET  TROPONIN I (HIGH SENSITIVITY)     EKG EKG Interpretation  Date/Time:  Monday December 13 2021 10:59:52 EDT Ventricular Rate:  72 PR Interval:  223 QRS Duration: 130 QT Interval:  385 QTC Calculation: 422 R Axis:   41 Text Interpretation: Sinus rhythm Prolonged PR interval Right bundle branch block ST changes similar to Aug 2021 Confirmed by Sherwood Gambler [id number]) on 12/13/2021 11:56:51 AM  Radiology DG Chest 2 View  Result Date: 12/13/2021 CLINICAL DATA:  Shortness of breath. EXAM: CHEST - 2 VIEW COMPARISON:  04/13/2021 chest x-ray. FINDINGS: The heart size and mediastinal contours are within normal  limits. Both lungs are clear. No visible pleural effusions or pneumothorax. No acute osseous abnormality. Degenerative changes of the spine, including bridging osteophytes. IMPRESSION: No evidence of acute cardiopulmonary disease. Electronically Signed   By: Margaretha Sheffield M.D.   On: 12/13/2021 11:32    Procedures Procedures    Medications Ordered in ED Medications  albuterol (VENTOLIN HFA) 108 (90 Base) MCG/ACT inhaler 2 puff (has no administration in time range)  lactated ringers bolus 500 mL (0 mLs Intravenous Stopped 12/13/21 1327)  acetaminophen (TYLENOL) tablet 1,000 mg (1,000 mg Oral Given 12/13/21 1221)    ED Course/ Medical Decision Making/ A&P                           Medical Decision Making Amount and/or Complexity of Data Reviewed Labs: ordered.    Details: Troponin normal.  Hemoglobin and WBC normal. Radiology: ordered and independent interpretation performed.    Details: No obvious pneumonia ECG/medicine tests: independent interpretation performed.    Details: Chronic findings, no acute ischemia.  Risk OTC drugs. Prescription drug management.   Patient's chest x-ray here shows no obvious pneumonia or fluid though the report from urgent care was concerning for a left sided pneumonia.  Her chest pain seems to be reproducible and likely from the degree of coughing she is having.  Offered to  change cough medicine but she declines.  Overall I am concerned she is developing pneumonia after having had COVID.  We will give her antibiotics.  She otherwise appears stable enough for discharge home with return precautions.  Low suspicion for ACS, PE, etc.        Final Clinical Impression(s) / ED Diagnoses Final diagnoses:  Atypical pneumonia    Rx / DC Orders ED Discharge Orders          Ordered    azithromycin (ZITHROMAX) 250 MG tablet  Daily        12/13/21 1403    amoxicillin-clavulanate (AUGMENTIN) 875-125 MG tablet  Every 12 hours        12/13/21 1403              Sherwood Gambler, MD 12/13/21 1629

## 2021-12-13 NOTE — ED Triage Notes (Signed)
Pt eports being sent from UC due to chest pain & SHOB. Pt reports abnomal EKG at UC and chest xray showing fluid in lungs.

## 2021-12-17 ENCOUNTER — Telehealth: Payer: Self-pay

## 2021-12-17 NOTE — Telephone Encounter (Signed)
Transition Care Management Follow-up Telephone Call Date of discharge and from where: 12/13/2021 [facility] ed How have you been since you were released from the hospital? She reports a little dizziness and popping in her ears. Any questions or concerns? No  Items Reviewed: Did the pt receive and understand the discharge instructions provided? Yes  Medications obtained and verified? Yes  Other? Yes  Any new allergies since your discharge? No  Dietary orders reviewed? Yes Do you have support at home? Yes   Home Care and Equipment/Supplies: Were home health services ordered? no If so, what is the name of the agency? N/a  Has the agency set up a time to come to the patient's home? no Were any new equipment or medical supplies ordered?  No What is the name of the medical supply agency? N/a Were you able to get the supplies/equipment? no Do you have any questions related to the use of the equipment or supplies? No  Functional Questionnaire: (I = Independent and D = Dependent) ADLs: i  Bathing/Dressing- i  Meal Prep- ii  Eating- i  Maintaining continence- i i Transferring/Ambulation- i  Managing Meds- i  Follow up appointments reviewed:  PCP Hospital f/u appt confirmed? Yes  Scheduled to see janece moore on n/a @ [facility]. Specialist Hospital f/u appt confirmed? No  Scheduled to see n/a on n/a @ n/a. Are transportation arrangements needed? No  If their condition worsens, is the pt aware to call PCP or go to the Emergency Dept.? Yes Was the patient provided with contact information for the PCP's office or ED? Yes Was to pt encouraged to call back with questions or concerns? Yes

## 2021-12-21 ENCOUNTER — Encounter: Payer: Self-pay | Admitting: Nurse Practitioner

## 2021-12-21 ENCOUNTER — Ambulatory Visit (INDEPENDENT_AMBULATORY_CARE_PROVIDER_SITE_OTHER): Payer: Medicare Other | Admitting: Nurse Practitioner

## 2021-12-21 VITALS — BP 130/78 | HR 68 | Temp 97.8°F | Ht 63.0 in | Wt 254.2 lb

## 2021-12-21 DIAGNOSIS — R051 Acute cough: Secondary | ICD-10-CM | POA: Diagnosis not present

## 2021-12-21 DIAGNOSIS — J1282 Pneumonia due to coronavirus disease 2019: Secondary | ICD-10-CM

## 2021-12-21 DIAGNOSIS — Z8616 Personal history of COVID-19: Secondary | ICD-10-CM

## 2021-12-21 DIAGNOSIS — U071 COVID-19: Secondary | ICD-10-CM | POA: Diagnosis not present

## 2021-12-21 MED ORDER — BENZONATATE 100 MG PO CAPS: 100.0000 mg | ORAL_CAPSULE | Freq: Four times a day (QID) | ORAL | 1 refills | Status: DC | PRN

## 2021-12-21 NOTE — Progress Notes (Signed)
Barnet Glasgow Martin,acting as a Education administrator for [name], FNP.,have documented all relevant documentation on the behalf of [name], FNP,as directed by  [name], FNP while in the presence of [name], [place].    Subjective:     Patient ID: Rhonda [name] , female    DOB: 31-Aug-1950 , 71 y.o.   MRN: [id number]   Chief Complaint  Patient presents with   Follow-up    ER    HPI  Patient presents today for a ER follow-up on 12/13/2021. Patient states they found pneumonia in her left lung, Patient states she still has a cough and her ears feel funny and her nose is draining.   Patient also states she is spitting out clear/white mucous. The hospital gave her antibiotic and has now completed it.   Patient states compliance with medication. She was taking zyrtec prior to the virtual visit for covid. She is using a nose spray.   BP Readings from Last 3 Encounters: 12/21/21 : 130/78 12/13/21 : 122/60 08/03/21 : 126/74        Past Medical History:  Diagnosis Date   Anemia    hx none since menopause   Anginal pain ([place])    due to chest wall from cough   Arthritis    "maybe in my legs" (06/15/2016)   Asthma 2018   none since   Complication of anesthesia    woke up during colonoscopy 03-31-16 Dr. [place]   GERD (gastroesophageal reflux disease)    Heart murmur 2018   History of hiatal hernia    Hyperlipemia    Hypertension    Hypokalemia due to excessive gastrointestinal loss of potassium 12/02/2019   Pre-diabetes 2019   Sinus congestion    "chronic" (06/15/2016)   Sinus headache    "a few/month" (06/15/2016)   Thyroid nodule 2010   "no problems w/them since; on a pill for a while" (06/15/2016)     Family History  Problem Relation Age of Onset   COPD Father    Breast cancer Neg Hx      Current Outpatient Medications:    acetaminophen (TYLENOL) 650 MG CR tablet, Take 1,300 mg by mouth at bedtime as needed for pain. , Disp: , Rfl:    albuterol (VENTOLIN HFA) 108 (90  Base) MCG/ACT inhaler, Inhale 2 puffs into the lungs every 4 (four) hours as needed for wheezing or shortness of breath., Disp: 18 g, Rfl: 1   Cholecalciferol (VITAMIN D3) 1.25 MG (50000 UT) CAPS, Take 1 tablet by mouth daily at 12 noon., Disp: , Rfl:    dorzolamide-timolol (COSOPT) 22.3-6.8 MG/ML ophthalmic solution, Place 1 drop into both eyes 2 (two) times daily. , Disp: , Rfl:    esomeprazole (NEXIUM) 40 MG capsule, Take 1 capsule (40 mg total) by mouth daily at 12 noon., Disp: 30 capsule, Rfl: 0   fluticasone (FLONASE) 50 MCG/ACT nasal spray, Use 1 spray(s) in each nostril once daily, Disp: 16 g, Rfl: 0   fluticasone-salmeterol (WIXELA INHUB) 250-50 MCG/ACT AEPB, Inhale 1 puff into the lungs in the morning and at bedtime., Disp: 60 each, Rfl: 5   hydrochlorothiazide (HYDRODIURIL) 25 MG tablet, Take 1 tablet by mouth once daily, Disp: 90 tablet, Rfl: 0   levocetirizine (XYZAL) 5 MG tablet, Take 1 tablet (5 mg total) by mouth 2 (two) times daily as needed for allergies (Can use an dose during flare ups.)., Disp: 60 tablet, Rfl: 5   losartan (COZAAR) 100 MG tablet, Take 1 tablet by  mouth once daily, Disp: 30 tablet, Rfl: 0   Magnesium 250 MG TABS, Take 1 tablet (250 mg total) by mouth daily., Disp: 30 tablet, Rfl: 0   metoprolol tartrate (LOPRESSOR) 25 MG tablet, Take 1 tablet (25 mg total) by mouth once for 1 dose. Take 90-120 minutes prior to scan., Disp: 1 tablet, Rfl: 0   Multiple Vitamin (MULTIVITAMIN WITH MINERALS) TABS tablet, Take 1 tablet by mouth daily., Disp: , Rfl:    polyethylene glycol (MIRALAX / GLYCOLAX) 17 g packet, Take 17 g by mouth daily as needed for mild constipation., Disp: 14 each, Rfl: 0   potassium chloride (KLOR-CON) 10 MEQ tablet, Take 1 tablet by mouth once daily, Disp: 90 tablet, Rfl: 0   rosuvastatin (CRESTOR) 40 MG tablet, Take 1 tablet (40 mg total) by mouth daily., Disp: 90 tablet, Rfl: 3   benzonatate (TESSALON PERLES) 100 MG capsule, Take 1 capsule (100 mg total)  by mouth every 6 (six) hours as needed., Disp: 30 capsule, Rfl: 1   blood glucose meter kit and supplies KIT, Dispense based on patient and insurance preference. Use up to four times daily as directed. (Patient not taking: Reported on 06/29/2021), Disp: 1 each, Rfl: 0   Probiotic CAPS, Take 1 capsule by mouth daily. (Patient not taking: Reported on 11/23/2021), Disp: , Rfl:    Allergies  Allergen Reactions   Aspirin-Acetaminophen-Caffeine Nausea And Vomiting   Codeine Diarrhea and Nausea And Vomiting    Stomach cramps    Oxycontin [Oxycodone Hcl] Other (See Comments)    Hallucination      Review of Systems  Constitutional: Negative.   HENT:  Positive for congestion.   Eyes: Negative.   Respiratory:  Positive for cough (will wake her up at night, clear secretions - white in color).   Cardiovascular: Negative.   Gastrointestinal: Negative.   Genitourinary:  Positive for frequency (she has been drinking more water).  Psychiatric/Behavioral: Negative.       Today's Vitals   12/21/21 1016  BP: 130/78  Pulse: 68  Temp: 97.8 F (36.6 C)  TempSrc: Oral  Weight: 254 lb 3.2 oz (115.3 kg)  Height: '5\' 3"'  (1.6 m)  PainSc: 0-No pain   Body mass index is 45.03 kg/m.  Wt Readings from Last 3 Encounters:  12/21/21 254 lb 3.2 oz (115.3 kg)  11/23/21 260 lb (117.9 kg)  08/03/21 263 lb 4 oz (119.4 kg)     Objective:  Physical Exam Vitals reviewed.  Constitutional:      Appearance: Normal appearance. She is well-developed.  HENT:     Head: Normocephalic and atraumatic.  Eyes:     Pupils: Pupils are equal, round, and reactive to light.  Cardiovascular:     Rate and Rhythm: Normal rate and regular rhythm.     Pulses: Normal pulses.     Heart sounds: Normal heart sounds. No murmur heard. Pulmonary:     Effort: Pulmonary effort is normal. No respiratory distress.     Breath sounds: Normal breath sounds. No wheezing.  Musculoskeletal:        General: Normal range of motion.   Skin:    General: Skin is warm and dry.     Capillary Refill: Capillary refill takes less than 2 seconds.  Neurological:     General: No focal deficit present.     Mental Status: She is alert and oriented to person, place, and time.     Cranial Nerves: No cranial nerve deficit.  Psychiatric:  Mood and Affect: Mood normal.        Behavior: Behavior normal.        Thought Content: Thought content normal.        Judgment: Judgment normal.         Assessment And Plan:     1. Pneumonia due to COVID-19 virus Comments: Diagnosed by symptoms and since she recently had Covid CXR did not show pneumonia will not repeat at this time.   2. Acute cough - benzonatate (TESSALON PERLES) 100 MG capsule; Take 1 capsule (100 mg total) by mouth every 6 (six) hours as needed.  Dispense: 30 capsule; Refill: 1  3. History of COVID-19 Comments: Was treated with antiviral     Patient was given opportunity to ask questions. Patient verbalized understanding of the plan and was able to repeat key elements of the plan. All questions were answered to their satisfaction.  [name], FNP   I, [name], FNP, have reviewed all documentation for this visit. The documentation on 12/21/21 for the exam, diagnosis, procedures, and orders are all accurate and complete.   IF YOU HAVE BEEN REFERRED TO A SPECIALIST, IT MAY TAKE 1-2 WEEKS TO SCHEDULE/PROCESS THE REFERRAL. IF YOU HAVE NOT HEARD FROM US/SPECIALIST IN TWO WEEKS, PLEASE GIVE [place] A CALL AT [phone] X [phone].   THE PATIENT IS ENCOURAGED TO PRACTICE SOCIAL DISTANCING DUE TO THE COVID-19 PANDEMIC.

## 2021-12-21 NOTE — Patient Instructions (Signed)

## 2021-12-24 ENCOUNTER — Encounter: Payer: Self-pay | Admitting: Nurse Practitioner

## 2021-12-25 ENCOUNTER — Other Ambulatory Visit: Payer: Self-pay | Admitting: Nurse Practitioner

## 2022-01-04 ENCOUNTER — Other Ambulatory Visit: Payer: Self-pay | Admitting: Nurse Practitioner

## 2022-01-04 DIAGNOSIS — Z1231 Encounter for screening mammogram for malignant neoplasm of breast: Secondary | ICD-10-CM

## 2022-01-10 ENCOUNTER — Other Ambulatory Visit: Payer: Self-pay | Admitting: Nurse Practitioner

## 2022-01-26 DIAGNOSIS — H26491 Other secondary cataract, right eye: Secondary | ICD-10-CM | POA: Diagnosis not present

## 2022-01-26 DIAGNOSIS — Z961 Presence of intraocular lens: Secondary | ICD-10-CM | POA: Diagnosis not present

## 2022-01-26 DIAGNOSIS — H401131 Primary open-angle glaucoma, bilateral, mild stage: Secondary | ICD-10-CM | POA: Diagnosis not present

## 2022-02-09 ENCOUNTER — Other Ambulatory Visit: Payer: Self-pay | Admitting: Internal Medicine

## 2022-02-10 ENCOUNTER — Ambulatory Visit
Admission: RE | Admit: 2022-02-10 | Discharge: 2022-02-10 | Disposition: A | Discharge status: Home / Self Care | Payer: Medicare Other | Source: Ambulatory Visit | Attending: Nurse Practitioner | Admitting: Nurse Practitioner

## 2022-02-10 DIAGNOSIS — Z1231 Encounter for screening mammogram for malignant neoplasm of breast: Secondary | ICD-10-CM | POA: Diagnosis not present

## 2022-02-11 ENCOUNTER — Other Ambulatory Visit: Payer: Self-pay | Admitting: Nurse Practitioner

## 2022-02-25 ENCOUNTER — Other Ambulatory Visit: Payer: Self-pay | Admitting: *Deleted

## 2022-02-25 ENCOUNTER — Other Ambulatory Visit: Payer: Self-pay | Admitting: Nurse Practitioner

## 2022-02-25 ENCOUNTER — Ambulatory Visit: Payer: Medicare Other | Attending: Internal Medicine

## 2022-02-25 DIAGNOSIS — Z79899 Other long term (current) drug therapy: Secondary | ICD-10-CM

## 2022-02-25 DIAGNOSIS — E785 Hyperlipidemia, unspecified: Secondary | ICD-10-CM

## 2022-02-26 LAB — LIPOPROTEIN A (LPA): Lipoprotein (a): 467.1 nmol/L — ABNORMAL HIGH (ref <75.0)

## 2022-02-27 LAB — NMR, LIPOPROFILE
Cholesterol, Total: 172 mg/dL (ref 100–199)
HDL Particle Number: 33 umol/L (ref >=30.5)
HDL-C: 63 mg/dL (ref >39)
LDL Particle Number: 1128 nmol/L — ABNORMAL HIGH (ref <1000)
LDL Size: 21.4 nm (ref >20.5)
LDL-C (NIH Calc): 96 mg/dL (ref 0–99)
LP-IR Score: 26 (ref <=45)
Small LDL Particle Number: 372 nmol/L (ref <=527)
Triglycerides: 70 mg/dL (ref 0–149)

## 2022-03-07 ENCOUNTER — Telehealth: Payer: Self-pay

## 2022-03-07 DIAGNOSIS — Z79899 Other long term (current) drug therapy: Secondary | ICD-10-CM

## 2022-03-07 DIAGNOSIS — E785 Hyperlipidemia, unspecified: Secondary | ICD-10-CM

## 2022-03-07 MED ORDER — EZETIMIBE 10 MG PO TABS: 10.0000 mg | ORAL_TABLET | Freq: Every day | ORAL | 3 refills | Status: DC

## 2022-03-07 NOTE — Telephone Encounter (Signed)
Result and message sent to the pts My Chart for their review.   

## 2022-03-07 NOTE — Telephone Encounter (Signed)
-----   Message from Dorris Carnes V, MD sent at 03/04/2022 11:20 PM EDT ----- LDL is still above 90 I would add Zetia to regimen Check lipomed with liver panel in 8 wks

## 2022-03-08 ENCOUNTER — Other Ambulatory Visit: Payer: Self-pay | Admitting: Nurse Practitioner

## 2022-03-09 ENCOUNTER — Ambulatory Visit: Payer: Medicare Other

## 2022-03-16 NOTE — Telephone Encounter (Signed)
Left a message for the pt to call back.  

## 2022-03-17 ENCOUNTER — Other Ambulatory Visit: Payer: Self-pay | Admitting: Nurse Practitioner

## 2022-03-17 MED ORDER — EZETIMIBE 10 MG PO TABS: 10.0000 mg | ORAL_TABLET | Freq: Every day | ORAL | 3 refills | Status: DC

## 2022-03-17 NOTE — Addendum Note (Signed)
Addended by: Bertram Millard on: 03/17/2022 10:35 AM   Modules accepted: Orders

## 2022-03-17 NOTE — Telephone Encounter (Signed)
The patient has been notified of the result and verbalized understanding.  All questions (if any) were answered.     

## 2022-03-21 ENCOUNTER — Other Ambulatory Visit: Payer: Self-pay

## 2022-03-21 MED ORDER — HYDROCHLOROTHIAZIDE 25 MG PO TABS: 25.0000 mg | ORAL_TABLET | Freq: Every day | ORAL | 1 refills | Status: DC

## 2022-03-23 ENCOUNTER — Ambulatory Visit (INDEPENDENT_AMBULATORY_CARE_PROVIDER_SITE_OTHER): Payer: Medicare Other

## 2022-03-23 VITALS — Ht 63.5 in | Wt 250.0 lb

## 2022-03-23 DIAGNOSIS — Z0000 Encounter for general adult medical examination without abnormal findings: Secondary | ICD-10-CM | POA: Diagnosis not present

## 2022-03-23 NOTE — Patient Instructions (Signed)
Rhonda [name] , Thank you for taking time to come for your Medicare Wellness Visit. I appreciate your ongoing commitment to your health goals. Please review the following plan we discussed and let me know if I can assist you in the future.   Screening recommendations/referrals: Colonoscopy: completed 02/16/2017, due now Mammogram: completed 02/10/2022, due 02/12/2023 Bone Density: completed 04/16/2019 Recommended yearly ophthalmology/optometry visit for glaucoma screening and checkup Recommended yearly dental visit for hygiene and checkup  Vaccinations: Influenza vaccine: due Pneumococcal vaccine: completed 06/29/2021 Tdap vaccine: due Shingles vaccine: completed   Covid-19: 09/24/2020, 03/13/2020, 06/29/2019, 06/08/2019  Advanced directives: Advance directive discussed with you today.   Conditions/risks identified: none  Next appointment: Follow up in one year for your annual wellness visit    Preventive Care 65 Years and Older, Female Preventive care refers to lifestyle choices and visits with your health care provider that can promote health and wellness. What does preventive care include? A yearly physical exam. This is also called an annual well check. Dental exams once or twice a year. Routine eye exams. Ask your health care provider how often you should have your eyes checked. Personal lifestyle choices, including: Daily care of your teeth and gums. Regular physical activity. Eating a healthy diet. Avoiding tobacco and drug use. Limiting alcohol use. Practicing safe sex. Taking low-dose aspirin every day. Taking vitamin and mineral supplements as recommended by your health care provider. What happens during an annual well check? The services and screenings done by your health care provider during your annual well check will depend on your age, overall health, lifestyle risk factors, and family history of disease. Counseling  Your health care provider may ask you questions  about your: Alcohol use. Tobacco use. Drug use. Emotional well-being. Home and relationship well-being. Sexual activity. Eating habits. History of falls. Memory and ability to understand (cognition). Work and work Astronomer. Reproductive health. Screening  You may have the following tests or measurements: Height, weight, and BMI. Blood pressure. Lipid and cholesterol levels. These may be checked every 5 years, or more frequently if you are over 105 years old. Skin check. Lung cancer screening. You may have this screening every year starting at age 14 if you have a 30-pack-year history of smoking and currently smoke or have quit within the past 15 years. Fecal occult blood test (FOBT) of the stool. You may have this test every year starting at age 72. Flexible sigmoidoscopy or colonoscopy. You may have a sigmoidoscopy every 5 years or a colonoscopy every 10 years starting at age 72. Hepatitis C blood test. Hepatitis B blood test. Sexually transmitted disease (STD) testing. Diabetes screening. This is done by checking your blood sugar (glucose) after you have not eaten for a while (fasting). You may have this done every 1-3 years. Bone density scan. This is done to screen for osteoporosis. You may have this done starting at age 72. Mammogram. This may be done every 1-2 years. Talk to your health care provider about how often you should have regular mammograms. Talk with your health care provider about your test results, treatment options, and if necessary, the need for more tests. Vaccines  Your health care provider may recommend certain vaccines, such as: Influenza vaccine. This is recommended every year. Tetanus, diphtheria, and acellular pertussis (Tdap, Td) vaccine. You may need a Td booster every 10 years. Zoster vaccine. You may need this after age 53. Pneumococcal 13-valent conjugate (PCV13) vaccine. One dose is recommended after age 72. Pneumococcal polysaccharide (PPSV23)  vaccine.  One dose is recommended after age 72. Talk to your health care provider about which screenings and vaccines you need and how often you need them. This information is not intended to replace advice given to you by your health care provider. Make sure you discuss any questions you have with your health care provider. Document Released: 05/15/2015 Document Revised: 01/06/2016 Document Reviewed: 02/17/2015 Elsevier Interactive Patient Education  2017 [employer] Prevention in the Home Falls can cause injuries. They can happen to people of all ages. There are many things you can do to make your home safe and to help prevent falls. What can I do on the outside of my home? Regularly fix the edges of walkways and driveways and fix any cracks. Remove anything that might make you trip as you walk through a door, such as a raised step or threshold. Trim any bushes or trees on the path to your home. Use bright outdoor lighting. Clear any walking paths of anything that might make someone trip, such as rocks or tools. Regularly check to see if handrails are loose or broken. Make sure that both sides of any steps have handrails. Any raised decks and porches should have guardrails on the edges. Have any leaves, snow, or ice cleared regularly. Use sand or salt on walking paths during winter. Clean up any spills in your garage right away. This includes oil or grease spills. What can I do in the bathroom? Use night lights. Install grab bars by the toilet and in the tub and shower. Do not use towel bars as grab bars. Use non-skid mats or decals in the tub or shower. If you need to sit down in the shower, use a plastic, non-slip stool. Keep the floor dry. Clean up any water that spills on the floor as soon as it happens. Remove soap buildup in the tub or shower regularly. Attach bath mats securely with double-sided non-slip rug tape. Do not have throw rugs and other things on the floor that  can make you trip. What can I do in the bedroom? Use night lights. Make sure that you have a light by your bed that is easy to reach. Do not use any sheets or blankets that are too big for your bed. They should not hang down onto the floor. Have a firm chair that has side arms. You can use this for support while you get dressed. Do not have throw rugs and other things on the floor that can make you trip. What can I do in the kitchen? Clean up any spills right away. Avoid walking on wet floors. Keep items that you use a lot in easy-to-reach places. If you need to reach something above you, use a strong step stool that has a grab bar. Keep electrical cords out of the way. Do not use floor polish or wax that makes floors slippery. If you must use wax, use non-skid floor wax. Do not have throw rugs and other things on the floor that can make you trip. What can I do with my stairs? Do not leave any items on the stairs. Make sure that there are handrails on both sides of the stairs and use them. Fix handrails that are broken or loose. Make sure that handrails are as long as the stairways. Check any carpeting to make sure that it is firmly attached to the stairs. Fix any carpet that is loose or worn. Avoid having throw rugs at the top or bottom of the  stairs. If you do have throw rugs, attach them to the floor with carpet tape. Make sure that you have a light switch at the top of the stairs and the bottom of the stairs. If you do not have them, ask someone to add them for you. What else can I do to help prevent falls? Wear shoes that: Do not have high heels. Have rubber bottoms. Are comfortable and fit you well. Are closed at the toe. Do not wear sandals. If you use a stepladder: Make sure that it is fully opened. Do not climb a closed stepladder. Make sure that both sides of the stepladder are locked into place. Ask someone to hold it for you, if possible. Clearly mark and make sure that you  can see: Any grab bars or handrails. First and last steps. Where the edge of each step is. Use tools that help you move around (mobility aids) if they are needed. These include: Canes. Walkers. Scooters. Crutches. Turn on the lights when you go into a dark area. Replace any light bulbs as soon as they burn out. Set up your furniture so you have a clear path. Avoid moving your furniture around. If any of your floors are uneven, fix them. If there are any pets around you, be aware of where they are. Review your medicines with your doctor. Some medicines can make you feel dizzy. This can increase your chance of falling. Ask your doctor what other things that you can do to help prevent falls. This information is not intended to replace advice given to you by your health care provider. Make sure you discuss any questions you have with your health care provider. Document Released: 02/12/2009 Document Revised: 09/24/2015 Document Reviewed: 05/23/2014 Elsevier Interactive Patient Education  2017 [employer].
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
## 2022-03-30 ENCOUNTER — Ambulatory Visit (INDEPENDENT_AMBULATORY_CARE_PROVIDER_SITE_OTHER): Payer: Medicare Other | Admitting: Nurse Practitioner

## 2022-03-30 ENCOUNTER — Encounter: Payer: Self-pay | Admitting: Nurse Practitioner

## 2022-03-30 VITALS — BP 124/80 | HR 72 | Temp 97.9°F | Ht 63.0 in | Wt 263.6 lb

## 2022-03-30 DIAGNOSIS — R7303 Prediabetes: Secondary | ICD-10-CM | POA: Diagnosis not present

## 2022-03-30 DIAGNOSIS — I10 Essential (primary) hypertension: Secondary | ICD-10-CM | POA: Diagnosis not present

## 2022-03-30 DIAGNOSIS — I700 Atherosclerosis of aorta: Secondary | ICD-10-CM | POA: Diagnosis not present

## 2022-03-30 DIAGNOSIS — Z6841 Body Mass Index (BMI) 40.0 and over, adult: Secondary | ICD-10-CM

## 2022-03-30 NOTE — Patient Instructions (Signed)
Hypertension, Adult High blood pressure (hypertension) is when the force of blood pumping through the arteries is too strong. The arteries are the blood vessels that carry blood from the heart throughout the body. Hypertension forces the heart to work harder to pump blood and may cause arteries to become narrow or stiff. Untreated or uncontrolled hypertension can lead to a heart attack, heart failure, a stroke, kidney disease, and other problems. A blood pressure reading consists of a higher number over a lower number. Ideally, your blood pressure should be below 120/80. The first ("top") number is called the systolic pressure. It is a measure of the pressure in your arteries as your heart beats. The second ("bottom") number is called the diastolic pressure. It is a measure of the pressure in your arteries as the heart relaxes. What are the causes? The exact cause of this condition is not known. There are some conditions that result in high blood pressure. What increases the risk? Certain factors may make you more likely to develop high blood pressure. Some of these risk factors are under your control, including: Smoking. Not getting enough exercise or physical activity. Being overweight. Having too much fat, sugar, calories, or salt (sodium) in your diet. Drinking too much alcohol. Other risk factors include: Having a personal history of heart disease, diabetes, high cholesterol, or kidney disease. Stress. Having a family history of high blood pressure and high cholesterol. Having obstructive sleep apnea. Age. The risk increases with age. What are the signs or symptoms? High blood pressure may not cause symptoms. Very high blood pressure (hypertensive crisis) may cause: Headache. Fast or irregular heartbeats (palpitations). Shortness of breath. Nosebleed. Nausea and vomiting. Vision changes. Severe chest pain, dizziness, and seizures. How is this diagnosed? This condition is diagnosed by  measuring your blood pressure while you are seated, with your arm resting on a flat surface, your legs uncrossed, and your feet flat on the floor. The cuff of the blood pressure monitor will be placed directly against the skin of your upper arm at the level of your heart. Blood pressure should be measured at least twice using the same arm. Certain conditions can cause a difference in blood pressure between your right and left arms. If you have a high blood pressure reading during one visit or you have normal blood pressure with other risk factors, you may be asked to: Return on a different day to have your blood pressure checked again. Monitor your blood pressure at home for 1 week or longer. If you are diagnosed with hypertension, you may have other blood or imaging tests to help your health care provider understand your overall risk for other conditions. How is this treated? This condition is treated by making healthy lifestyle changes, such as eating healthy foods, exercising more, and reducing your alcohol intake. You may be referred for counseling on a healthy diet and physical activity. Your health care provider may prescribe medicine if lifestyle changes are not enough to get your blood pressure under control and if: Your systolic blood pressure is above 130. Your diastolic blood pressure is above 80. Your personal target blood pressure may vary depending on your medical conditions, your age, and other factors. Follow these instructions at home: Eating and drinking  Eat a diet that is high in fiber and potassium, and low in sodium, added sugar, and fat. An example of this eating plan is called the DASH diet. DASH stands for Dietary Approaches to Stop Hypertension. To eat this way: Eat   plenty of fresh fruits and vegetables. Try to fill one half of your plate at each meal with fruits and vegetables. Eat whole grains, such as whole-wheat pasta, brown rice, or whole-grain bread. Fill about one  fourth of your plate with whole grains. Eat or drink low-fat dairy products, such as skim milk or low-fat yogurt. Avoid fatty cuts of meat, processed or cured meats, and poultry with skin. Fill about one fourth of your plate with lean proteins, such as fish, chicken without skin, beans, eggs, or tofu. Avoid pre-made and processed foods. These tend to be higher in sodium, added sugar, and fat. Reduce your daily sodium intake. Many people with hypertension should eat less than 1,500 mg of sodium a day. Do not drink alcohol if: Your health care provider tells you not to drink. You are pregnant, may be pregnant, or are planning to become pregnant. If you drink alcohol: Limit how much you have to: 0-1 drink a day for women. 0-2 drinks a day for men. Know how much alcohol is in your drink. In the [place] one drink equals one 12 oz bottle of beer (355 mL), one 5 oz glass of wine (148 mL), or one 1 oz glass of hard liquor (44 mL). Lifestyle  Work with your health care provider to maintain a healthy body weight or to lose weight. Ask what an ideal weight is for you. Get at least 30 minutes of exercise that causes your heart to beat faster (aerobic exercise) most days of the week. Activities may include walking, swimming, or biking. Include exercise to strengthen your muscles (resistance exercise), such as Pilates or lifting weights, as part of your weekly exercise routine. Try to do these types of exercises for 30 minutes at least 3 days a week. Do not use any products that contain nicotine or tobacco. These products include cigarettes, chewing tobacco, and vaping devices, such as e-cigarettes. If you need help quitting, ask your health care provider. Monitor your blood pressure at home as told by your health care provider. Keep all follow-up visits. This is important. Medicines Take over-the-counter and prescription medicines only as told by your health care provider. Follow directions carefully. Blood  pressure medicines must be taken as prescribed. Do not skip doses of blood pressure medicine. Doing this puts you at risk for problems and can make the medicine less effective. Ask your health care provider about side effects or reactions to medicines that you should watch for. Contact a health care provider if you: Think you are having a reaction to a medicine you are taking. Have headaches that keep coming back (recurring). Feel dizzy. Have swelling in your ankles. Have trouble with your vision. Get help right away if you: Develop a severe headache or confusion. Have unusual weakness or numbness. Feel faint. Have severe pain in your chest or abdomen. Vomit repeatedly. Have trouble breathing. These symptoms may be an emergency. Get help right away. Call 911. Do not wait to see if the symptoms will go away. Do not drive yourself to the hospital. Summary Hypertension is when the force of blood pumping through your arteries is too strong. If this condition is not controlled, it may put you at risk for serious complications. Your personal target blood pressure may vary depending on your medical conditions, your age, and other factors. For most people, a normal blood pressure is less than 120/80. Hypertension is treated with lifestyle changes, medicines, or a combination of both. Lifestyle changes include losing weight, eating a healthy,   low-sodium diet, exercising more, and limiting alcohol. This information is not intended to replace advice given to you by your health care provider. Make sure you discuss any questions you have with your health care provider. Document Revised: 02/23/2021 Document Reviewed: 02/23/2021 Elsevier Patient Education  2023 [employer]  

## 2022-03-30 NOTE — Progress Notes (Signed)
I,[name],acting as a Education administrator for [employer], FNP.,have documented all relevant documentation on the behalf of [name], FNP,as directed by  [name], FNP while in the presence of [name], [place].  Subjective:     Patient ID: Rhonda [name] , female    DOB: Aug 15, 1950 , 71 y.o.   MRN: [id number]   Chief Complaint  Patient presents with   Hypertension    HPI  PT presents today for BPC.  She sees Dr. Harrington Challenger - Cardiology was started on Zetia began 1.5 weeks ago. She is taking xyzal every night.   Denies chest pain, headache, SOB ,blurred vision.   Hypertension This is a chronic problem. The current episode started more than 1 year ago. The problem is unchanged. The problem is controlled. Pertinent negatives include no anxiety or blurred vision. There are no associated agents to hypertension. Risk factors for coronary artery disease include obesity and sedentary lifestyle. Compliance problems include exercise.  There is no history of chronic renal disease.     Past Medical History:  Diagnosis Date   Anemia    hx none since menopause   Anginal pain ([place])    due to chest wall from cough   Arthritis    "maybe in my legs" (06/15/2016)   Asthma 2018   none since   Complication of anesthesia    woke up during colonoscopy 03-31-16 Dr. Thornton [place]   GERD (gastroesophageal reflux disease)    Heart murmur 2018   History of hiatal hernia    Hyperlipemia    Hypertension    Hypokalemia due to excessive gastrointestinal loss of potassium 12/02/2019   Pre-diabetes 2019   Sinus congestion    "chronic" (06/15/2016)   Sinus headache    "a few/month" (06/15/2016)   Thyroid nodule 2010   "no problems w/them since; on a pill for a while" (06/15/2016)     Family History  Problem Relation Age of Onset   COPD Father    Breast cancer Neg Hx      Current Outpatient Medications:    acetaminophen (TYLENOL) 650 MG CR tablet, Take 1,300 mg by mouth at bedtime as needed for pain. ,  Disp: , Rfl:    albuterol (VENTOLIN HFA) 108 (90 Base) MCG/ACT inhaler, Inhale 2 puffs into the lungs every 4 (four) hours as needed for wheezing or shortness of breath., Disp: 18 g, Rfl: 1   Cholecalciferol (VITAMIN D3) 1.25 MG (50000 UT) CAPS, Take 1 tablet by mouth daily at 12 noon., Disp: , Rfl:    dorzolamide-timolol (COSOPT) 22.3-6.8 MG/ML ophthalmic solution, Place 1 drop into both eyes 2 (two) times daily. , Disp: , Rfl:    esomeprazole (NEXIUM) 40 MG capsule, Take 1 capsule (40 mg total) by mouth daily at 12 noon., Disp: 30 capsule, Rfl: 0   fluticasone (FLONASE) 50 MCG/ACT nasal spray, Use 1 spray(s) in each nostril once daily, Disp: 16 g, Rfl: 0   fluticasone-salmeterol (WIXELA INHUB) 250-50 MCG/ACT AEPB, Inhale 1 puff into the lungs in the morning and at bedtime., Disp: 60 each, Rfl: 5   hydrochlorothiazide (HYDRODIURIL) 25 MG tablet, Take 1 tablet (25 mg total) by mouth daily., Disp: 90 tablet, Rfl: 1   levocetirizine (XYZAL) 5 MG tablet, Take 1 tablet (5 mg total) by mouth 2 (two) times daily as needed for allergies (Can use an dose during flare ups.)., Disp: 60 tablet, Rfl: 5   Magnesium 250 MG TABS, Take 1 tablet (250 mg total) by mouth daily., Disp: 30  tablet, Rfl: 0   Multiple Vitamin (MULTIVITAMIN WITH MINERALS) TABS tablet, Take 1 tablet by mouth daily., Disp: , Rfl:    polyethylene glycol (MIRALAX / GLYCOLAX) 17 g packet, Take 17 g by mouth daily as needed for mild constipation., Disp: 14 each, Rfl: 0   potassium chloride (KLOR-CON) 10 MEQ tablet, Take 1 tablet by mouth once daily, Disp: 90 tablet, Rfl: 0   rosuvastatin (CRESTOR) 40 MG tablet, Take 1 tablet (40 mg total) by mouth daily., Disp: 90 tablet, Rfl: 3   losartan (COZAAR) 100 MG tablet, Take 1 tablet by mouth once daily, Disp: 30 tablet, Rfl: 0   metoprolol tartrate (LOPRESSOR) 25 MG tablet, Take 1 tablet (25 mg total) by mouth once for 1 dose. Take 90-120 minutes prior to scan., Disp: 1 tablet, Rfl: 0   Allergies   Allergen Reactions   Aspirin-Acetaminophen-Caffeine Nausea And Vomiting   Codeine Diarrhea and Nausea And Vomiting    Stomach cramps    Oxycontin [Oxycodone Hcl] Other (See Comments)    Hallucination      Review of Systems  Constitutional: Negative.   Eyes:  Negative for blurred vision.  Respiratory: Negative.    Cardiovascular: Negative.   Gastrointestinal: Negative.   Neurological: Negative.   Psychiatric/Behavioral: Negative.       Today's Vitals   03/30/22 1014  BP: 124/80  Pulse: 72  Temp: 97.9 F (36.6 C)  SpO2: 98%  Weight: 263 lb 9.6 oz (119.6 kg)  Height: _0  (1.6 m)   Body mass index is 46.69 kg/m.  Wt Readings from Last 3 Encounters:  03/30/22 263 lb 9.6 oz (119.6 kg)  03/23/22 250 lb (113.4 kg)  12/21/21 254 lb 3.2 oz (115.3 kg)    Objective:  Physical Exam Vitals reviewed.  Constitutional:      Appearance: Normal appearance.  Cardiovascular:     Rate and Rhythm: Normal rate and regular rhythm.     Pulses: Normal pulses.     Heart sounds: Normal heart sounds. No murmur heard. Pulmonary:     Effort: Pulmonary effort is normal. No respiratory distress.     Breath sounds: Normal breath sounds. No wheezing.  Neurological:     Mental Status: She is alert.         Assessment And Plan:     1. Essential hypertension Comments: Blood pressure is fairly controlled. She is to continue current medications. - BMP8+eGFR  2. Prediabetes Comments: Diet controlled, continue focusing on healthy diet and exercising as tolerated. - Hemoglobin A1c  3. Atherosclerosis of aorta (HCC) Comments: Continue statin, tolerating well.  4. Class 3 severe obesity due to excess calories without serious comorbidity with body mass index (BMI) of 40.0 to 44.9 in adult [facility]) She is encouraged to strive for BMI less than 30 to decrease cardiac risk. Advised to aim for at least 150 minutes of exercise per week. She is interested in PREP.  - Amb Referral To Provider  Referral Exercise Program (P.R.E.P)    Patient was given opportunity to ask questions. Patient verbalized understanding of the plan and was able to repeat key elements of the plan. All questions were answered to their satisfaction.  [name], FNP   I, [name], FNP, have reviewed all documentation for this visit. The documentation on 03/30/22 for the exam, diagnosis, procedures, and orders are all accurate and complete.   IF YOU HAVE BEEN REFERRED TO A SPECIALIST, IT MAY TAKE 1-2 WEEKS TO SCHEDULE/PROCESS THE REFERRAL. IF YOU HAVE NOT HEARD  FROM US/SPECIALIST IN TWO WEEKS, PLEASE GIVE [place] A CALL AT [phone].   THE PATIENT IS ENCOURAGED TO PRACTICE SOCIAL DISTANCING DUE TO THE COVID-19 PANDEMIC.

## 2022-03-31 LAB — HEMOGLOBIN A1C
Est. average glucose Bld gHb Est-mCnc: 134 mg/dL
Hgb A1c MFr Bld: 6.3 % — ABNORMAL HIGH (ref 4.8–5.6)

## 2022-03-31 LAB — BMP8+EGFR
BUN/Creatinine Ratio: 23 (ref 12–28)
BUN: 18 mg/dL (ref 8–27)
CO2: 29 mmol/L (ref 20–29)
Calcium: 9.9 mg/dL (ref 8.7–10.3)
Chloride: 100 mmol/L (ref 96–106)
Creatinine, Ser: 0.77 mg/dL (ref 0.57–1.00)
Glucose: 107 mg/dL — ABNORMAL HIGH (ref 70–99)
Potassium: 4 mmol/L (ref 3.5–5.2)
Sodium: 139 mmol/L (ref 134–144)
eGFR: 82 mL/min/{1.73_m2} (ref >59)

## 2022-04-01 ENCOUNTER — Telehealth: Payer: Self-pay

## 2022-04-01 NOTE — Telephone Encounter (Signed)
Vmt pt requesting call back to discuss PREP referral

## 2022-04-01 NOTE — Telephone Encounter (Signed)
Duplicate note

## 2022-04-04 ENCOUNTER — Telehealth: Payer: Self-pay

## 2022-04-04 NOTE — Telephone Encounter (Signed)
Returned pt's call. Pt is interested in participating in program. Will need assist with cost of PREP.  Would like M/W 1030a class.  Advised will call her when starting that class in January and to schedule  intake.   Pt has contact numbers.

## 2022-04-11 ENCOUNTER — Other Ambulatory Visit: Payer: Self-pay | Admitting: Nurse Practitioner

## 2022-04-12 ENCOUNTER — Telehealth: Payer: Self-pay | Admitting: Internal Medicine

## 2022-04-12 NOTE — Telephone Encounter (Signed)
Pt c/o medication issue:  1. Name of Medication: Zetia  2. How are you currently taking this medication (dosage and times per day)? 1 time a day  3. Are you having a reaction (difficulty breathing--STAT)?   4. What is your medication issue? She said she can hardly move her arms and legs. She said , this morning she is her left leg is rally hurting bad. She said she  can hardly walk on her left leg.

## 2022-04-12 NOTE — Telephone Encounter (Signed)
Pt called to report that she started Zetia 03/17/22 and last week she started having muscle aches in her arms and now she is having them in her legs bilaterally but her left leg is worse than her right... she is having trouble with weight bearing... I strongly urged her to go to the ED since these symptoms are not typical and she may have something else going on but she declined... she say she tried Zetia several years ago and had similar symptoms... she denies edema, heat, change in color. She denies chest pain, weakness, slurred speech, no headache.   She will stop the Zetia and continue to monitor. If her symptoms worsen and not subside then she needs to be seen at an Urgent Care or ED ASAP and the pt agrees... she will call back in a few days to let us know how she is doing.

## 2022-04-13 ENCOUNTER — Ambulatory Visit: Payer: Self-pay

## 2022-04-13 NOTE — Telephone Encounter (Signed)
Agree with recommendations.  

## 2022-04-13 NOTE — Patient Outreach (Signed)
  Care Coordination   04/13/2022 Name: Rhonda [name] MRN: [id number] DOB: Aug 16, 1950   Care Coordination Outreach Attempts:  An unsuccessful telephone outreach was attempted today to offer the patient information about available care coordination services as a benefit of their health plan.   Follow Up Plan:  Additional outreach attempts will be made to offer the patient care coordination information and services.   Encounter Outcome:  No Answer   Care Coordination Interventions:  Yes, provided    [name], BSW, CDP Social Worker, Certified Dementia Practitioner [facility] Care Management  Care Coordination [phone]

## 2022-04-14 ENCOUNTER — Emergency Department (HOSPITAL_COMMUNITY)
Admission: EM | Admit: 2022-04-14 | Discharge: 2022-04-14 | Disposition: A | Discharge status: Home / Self Care | Payer: Medicare Other | Attending: Emergency Medicine | Admitting: Emergency Medicine

## 2022-04-14 ENCOUNTER — Emergency Department (HOSPITAL_COMMUNITY): Payer: Medicare Other

## 2022-04-14 ENCOUNTER — Ambulatory Visit: Payer: Self-pay

## 2022-04-14 DIAGNOSIS — M25552 Pain in left hip: Secondary | ICD-10-CM | POA: Insufficient documentation

## 2022-04-14 DIAGNOSIS — M25512 Pain in left shoulder: Secondary | ICD-10-CM | POA: Diagnosis not present

## 2022-04-14 DIAGNOSIS — M79652 Pain in left thigh: Secondary | ICD-10-CM | POA: Insufficient documentation

## 2022-04-14 DIAGNOSIS — M47816 Spondylosis without myelopathy or radiculopathy, lumbar region: Secondary | ICD-10-CM | POA: Diagnosis not present

## 2022-04-14 DIAGNOSIS — M791 Myalgia, unspecified site: Secondary | ICD-10-CM | POA: Diagnosis not present

## 2022-04-14 DIAGNOSIS — M545 Low back pain, unspecified: Secondary | ICD-10-CM | POA: Diagnosis not present

## 2022-04-14 LAB — BASIC METABOLIC PANEL
Anion gap: 10 (ref 5–15)
BUN: 21 mg/dL (ref 8–23)
CO2: 27 mmol/L (ref 22–32)
Calcium: 9.7 mg/dL (ref 8.9–10.3)
Chloride: 102 mmol/L (ref 98–111)
Creatinine, Ser: 0.87 mg/dL (ref 0.44–1.00)
GFR, Estimated: >60 mL/min (ref >60)
Glucose, Bld: 97 mg/dL (ref 70–99)
Potassium: 3.8 mmol/L (ref 3.5–5.1)
Sodium: 139 mmol/L (ref 135–145)

## 2022-04-14 LAB — URINALYSIS, ROUTINE W REFLEX MICROSCOPIC
Bilirubin Urine: NEGATIVE
Glucose, UA: NEGATIVE mg/dL
Hgb urine dipstick: NEGATIVE
Ketones, ur: NEGATIVE mg/dL
Leukocytes,Ua: NEGATIVE
Nitrite: NEGATIVE
Protein, ur: NEGATIVE mg/dL
Specific Gravity, Urine: 1.017 (ref 1.005–1.030)
pH: 5 (ref 5.0–8.0)

## 2022-04-14 LAB — CBC
HCT: 36.5 % (ref 36.0–46.0)
Hemoglobin: 12.2 g/dL (ref 12.0–15.0)
MCH: 29.5 pg (ref 26.0–34.0)
MCHC: 33.4 g/dL (ref 30.0–36.0)
MCV: 88.2 fL (ref 80.0–100.0)
Platelets: 299 10*3/uL (ref 150–400)
RBC: 4.14 MIL/uL (ref 3.87–5.11)
RDW: 13.9 % (ref 11.5–15.5)
WBC: 7 10*3/uL (ref 4.0–10.5)
nRBC: 0 % (ref 0.0–0.2)

## 2022-04-14 LAB — CK: Total CK: 292 U/L — ABNORMAL HIGH (ref 38–234)

## 2022-04-14 NOTE — Patient Instructions (Signed)
Visit Information  Thank you for taking time to visit with me today. Please don't hesitate to contact me if I can be of assistance to you.   Following are the goals we discussed today:   Goals Addressed             This Visit's Progress    COMPLETED: Care Coordination Activities       Care Coordination Interventions: SDoH screening performed - no acute resource challenges identified at this time Performed chart review to note patient contacted her Cardiologists office on 12/12 to discuss pain in her left leg. The patient reports she was advised to be seen in the ED to rule out a blood clot Discussed the patient is still experiencing pain in her left leg, hip and shoulder. The patient states she contacted her primary care providers office to request an office visit and is awaiting a return call Collaboration with [name], FNP while on the call with the patient to report pain is now also in the left hip and left shoulder. Patients primary provider advises patient be seen in the ED Advised the patient of [name]'s recommendation. Patient agreed and confirmed she does not need assistance with transportation Discussed plan for SW to request RN Care Manager [name] to follow up with the patient over the next month to assess for clinical needs; patient agreeable Collaboration with primary care provider [name] FNP to advise patient agreeable to ED visit. Advised of plan to have RN Care Manager follow up with the patient over the next month Referral placed to [facility] Manager advising of above interventions and request she contact the patient to assist with care coordination needs         If you are experiencing a Mental Health or Behavioral Health Crisis or need someone to talk to, please call 1-800-273-TALK (toll free, 24 hour hotline) call 911  Patient verbalizes understanding of instructions and care plan provided today and agrees to view in MyChart. Active  MyChart status and patient understanding of how to access instructions and care plan via MyChart confirmed with patient.     The care management team will reach out to the patient again over the next 30 days.   [name], BSW, CDP Social Worker, Certified Dementia Practitioner [facility] Care Management  Care Coordination [phone]

## 2022-04-14 NOTE — ED Notes (Signed)
AVS reviewed with pt prior to discharge. Pt and family verbalize understanding. Belongings with pt upon depart. Wheelchair taken to POV w/family.

## 2022-04-14 NOTE — ED Provider Notes (Signed)
[facility] EMERGENCY DEPARTMENT Provider Note   CSN: [id number] Arrival date & time: 04/14/22  1335     History  Chief Complaint  Patient presents with   Muscle Pain    Rhonda [name] is a 71 y.o. female.  71 year old female who presents to the ER today secondary to myalgias.  Patient states that she started Zetia a few weeks ago and on Monday she started having muscle aches similar to last time she was on Zetia.  She called her doctor and stopped it.  They told her to come to the ED immediately because it did not seem like it was normal from Zetia however the patient declined at that time.  After couple days he has slightly better but not much she was worried for blood clot versus spinal stenosis so she presented here for further evaluation.  The soreness is worse in her left shoulder left arm left hip and thigh and less so in her left calf right leg and right shoulder.  She states that cold seem to help quite a bit.  Of note she is also on a statin it appears that she has been on that for quite a while without any problems.  She is urinating normally (quite a bit since she is on diuretics) but has not been dark or discolored in any way.   Muscle Pain       Home Medications Prior to Admission medications   Medication Sig Start Date End Date Taking? Authorizing Provider  acetaminophen (TYLENOL) 650 MG CR tablet Take 1,300 mg by mouth at bedtime as needed for pain.     [provider]  albuterol (VENTOLIN HFA) 108 (90 Base) MCG/ACT inhaler Inhale 2 puffs into the lungs every 4 (four) hours as needed for wheezing or shortness of breath. 08/03/21   Kozlow, Alvira Philips, MD  Cholecalciferol (VITAMIN D3) 1.25 MG (50000 UT) CAPS Take 1 tablet by mouth daily at 12 noon.    [provider]  dorzolamide-timolol (COSOPT) 22.3-6.8 MG/ML ophthalmic solution Place 1 drop into both eyes 2 (two) times daily.     [provider]  esomeprazole (NEXIUM) 40 MG  capsule Take 1 capsule (40 mg total) by mouth daily at 12 noon. 12/05/19   Lynn Ito, MD  fluticasone Aleda Grana) 50 MCG/ACT nasal spray Use 1 spray(s) in each nostril once daily 02/11/22   Dorothyann Peng, MD  fluticasone-salmeterol [facility] INHUB) 250-50 MCG/ACT AEPB Inhale 1 puff into the lungs in the morning and at bedtime. 08/03/21   Kozlow, Alvira Philips, MD  hydrochlorothiazide (HYDRODIURIL) 25 MG tablet Take 1 tablet (25 mg total) by mouth daily. 03/21/22   [name], FNP  levocetirizine (XYZAL) 5 MG tablet Take 1 tablet (5 mg total) by mouth 2 (two) times daily as needed for allergies (Can use an dose during flare ups.). 08/03/21   Kozlow, Alvira Philips, MD  losartan (COZAAR) 100 MG tablet Take 1 tablet by mouth once daily 04/11/22   [name], FNP  Magnesium 250 MG TABS Take 1 tablet (250 mg total) by mouth daily. 12/16/20   [name], FNP  metoprolol tartrate (LOPRESSOR) 25 MG tablet Take 1 tablet (25 mg total) by mouth once for 1 dose. Take 90-120 minutes prior to scan. 06/17/21 12/21/21  Pricilla Riffle, MD  Multiple Vitamin (MULTIVITAMIN WITH MINERALS) TABS tablet Take 1 tablet by mouth daily.    [provider]  polyethylene glycol (MIRALAX / GLYCOLAX) 17 g packet Take 17 g by  mouth daily as needed for mild constipation. 12/05/19   Lynn Ito, MD  potassium chloride (KLOR-CON) 10 MEQ tablet Take 1 tablet by mouth once daily 02/28/22   [name], FNP  rosuvastatin (CRESTOR) 40 MG tablet Take 1 tablet (40 mg total) by mouth daily. 12/03/21   Pricilla Riffle, MD      Allergies    Aspirin-acetaminophen-caffeine, Codeine, and Oxycontin [oxycodone hcl]    Review of Systems   Review of Systems  Physical Exam Updated Vital Signs BP (!) 133/53   Pulse 66   Temp 97.8 F (36.6 C) (Oral)   Resp 12   SpO2 99%  Physical Exam Vitals and nursing note reviewed.  Constitutional:      Appearance: She is well-developed.  HENT:     Head: Normocephalic and atraumatic.     Mouth/Throat:      Mouth: Mucous membranes are moist.     Pharynx: Oropharynx is clear.  Eyes:     Pupils: Pupils are equal, round, and reactive to light.  Cardiovascular:     Rate and Rhythm: Normal rate and regular rhythm.  Pulmonary:     Effort: No respiratory distress.     Breath sounds: No stridor.  Abdominal:     General: There is no distension.  Musculoskeletal:        General: Tenderness (Diffuse tenderness mostly of her left lateral hip, thigh I and posterior shoulder) present. Normal range of motion.     Cervical back: Normal range of motion.  Skin:    General: Skin is warm and dry.  Neurological:     General: No focal deficit present.     Mental Status: She is alert.     ED Results / Procedures / Treatments   Labs (all labs ordered are listed, but only abnormal results are displayed) Labs Reviewed  URINALYSIS, ROUTINE W REFLEX MICROSCOPIC - Abnormal; Notable for the following components:      Result Value   APPearance HAZY (*)    All other components within normal limits  CK - Abnormal; Notable for the following components:   Total CK 292 (*)    All other components within normal limits  CBC  BASIC METABOLIC PANEL    EKG None  Radiology DG Lumbar Spine Complete  Result Date: 04/14/2022 CLINICAL DATA:  Back pain EXAM: LUMBAR SPINE - COMPLETE 4+ VIEW COMPARISON:  12/02/2019 FINDINGS: Five lumbar type vertebral segments. Vertebral body heights and alignment are maintained. Prior posterior and interbody fusion at L5-S1. Mild disc height loss at L4-5. Moderate lower lumbar facet arthropathy. Abdominal aortic atherosclerosis. Calcified uterine fibroid. IMPRESSION: Facet-predominant lower lumbar spondylosis.  No acute findings. Electronically Signed   By: Duanne Guess D.O.   On: 04/14/2022 16:24    Procedures Procedures    Medications Ordered in ED Medications - No data to display  ED Course/ Medical Decision Making/ A&P                           Medical Decision  Making  Patient CK slightly elevated but kidney function is fine unlikely to be in rhabdo.  Since that exact same symptoms with Zetia in the past I suspect is the same thing.  She is already stopped it.  Discussed with pharmacy and missed 8 that it could be up to a week before it starts to get better.  Relay this information to the patient.  She does not have any evidence that this  could be spinal stenosis or sciatica.  They were worried about a DVT as she had a blood clot in the past.  Her blood clot in the past was postsurgical her left leg is not swollen she does not have any calf tenderness noticed having pain with dorsiflexion plantarflexion of her left foot.  I think a DVT is unlikely at this point. Will have her follow up with pcp on  Monday if not significantly improved.   Final Clinical Impression(s) / ED Diagnoses Final diagnoses:  Myalgia    Rx / DC Orders ED Discharge Orders     None         Jazzlin Clements, Barbara Cower, MD 04/14/22 2337

## 2022-04-14 NOTE — ED Provider Triage Note (Signed)
Emergency Medicine Provider Triage Evaluation Note  Rhonda [name] , a 71 y.o. female  was evaluated in triage.  Pt complains of pain in the L leg, left low back, and L shoulder, onset Monday. Symptoms have been persistent, worse with movement/ambulation. Thought symptoms were related to Zetia started 1 month ago for cholesterol, but stopped this Tuesday without change to symptoms. Does have hx of back surgery many years ago and reports similar pain prior to her surgery. Denies fever, hx of CA, chronic steroid use, genital/perianal numbness. Has had some urinary urge incontinence; no stool incontinence.  Review of Systems  Positive: As above Negative: As above  Physical Exam  BP (!) 173/64   Pulse 69   Temp (!) 97.3 F (36.3 C) (Oral)   Resp 20   SpO2 98%  Gen:   Awake, no distress   Resp:  Normal effort  MSK:   Moves extremities without difficulty  Other:  Ambulatory w/steady gait.  Medical Decision Making  Medically screening exam initiated at 2:44 PM.  Appropriate orders placed.  Rhonda [name] was informed that the remainder of the evaluation will be completed by another provider, this initial triage assessment does not replace that evaluation, and the importance of remaining in the ED until their evaluation is complete.  Low back pain, L shoulder pain - neurovascularly intact in triage, ambulatory. W/u initiated.   Antony Madura, PA-C 04/14/22 1446

## 2022-04-14 NOTE — Patient Outreach (Signed)
  Care Coordination   Initial Visit Note   04/14/2022 Name: Rhonda [name] MRN: [id number] DOB: Apr 27, 1951  Rhonda [name] is a 71 y.o. year old female who sees Rhonda Felts, FNP for primary care. I spoke with  Rhonda [name] by phone today.  What matters to the patients health and wellness today?  I am having pain in my left leg, hip and shoulder.    Goals Addressed             This Visit's Progress    COMPLETED: Care Coordination Activities       Care Coordination Interventions: SDoH screening performed - no acute resource challenges identified at this time Performed chart review to note patient contacted her Cardiologists office on 12/12 to discuss pain in her left leg. The patient reports she was advised to be seen in the ED to rule out a blood clot Discussed the patient is still experiencing pain in her left leg, hip and shoulder. The patient states she contacted her primary care providers office to request an office visit and is awaiting a return call Collaboration with Rhonda Felts, FNP while on the call with the patient to report pain is now also in the left hip and left shoulder. Patients primary provider advises patient be seen in the ED Advised the patient of Rhonda [name]'s recommendation. Patient agreed and confirmed she does not need assistance with transportation Discussed plan for SW to request RN Care Manager Rhonda [name] to follow up with the patient over the next month to assess for clinical needs; patient agreeable Collaboration with primary care provider Rhonda Felts FNP to advise patient agreeable to ED visit. Advised of plan to have RN Care Manager follow up with the patient over the next month Referral placed to [facility] Manager advising of above interventions and request she contact the patient to assist with care coordination needs         SDOH assessments and interventions completed:  Yes  SDOH Interventions Today    Flowsheet Row  Most Recent Value  SDOH Interventions   Food Insecurity Interventions Intervention Not Indicated  Housing Interventions Intervention Not Indicated  Transportation Interventions Intervention Not Indicated  Utilities Interventions Intervention Not Indicated        Care Coordination Interventions:  Yes, provided   Follow up plan: Referral made to [facility] Care Manager Rhonda [name].    Encounter Outcome:  Pt. Visit Completed   Rhonda [name], BSW, CDP Social Worker, Certified Dementia Practitioner [facility] Care Management  Care Coordination [phone]

## 2022-04-14 NOTE — ED Triage Notes (Signed)
Patient here with complaint of muscle pain in her left thigh, hip, back, and left shoulder that started Monday. Patient states she contacted her PCP who stopped her zetia prescription but patient states the pain has no improved. Patient is alert, oriented, ambulating independently with steady gait.

## 2022-04-15 ENCOUNTER — Telehealth: Payer: Self-pay | Admitting: *Deleted

## 2022-04-15 NOTE — Progress Notes (Signed)
  Care Coordination   Note   04/15/2022 Name: DEVANSHI CALIFF MRN: [id number] DOB: 12-01-1950  Fay Records Scheck is a 71 y.o. year old female who sees [name], FNP for primary care. I reached out to [name] by phone today to offer care coordination services.  Ms. Stallings was given information about Care Coordination services today including:   The Care Coordination services include support from the care team which includes your Nurse Coordinator, Clinical Social Worker, or Pharmacist.  The Care Coordination team is here to help remove barriers to the health concerns and goals most important to you. Care Coordination services are voluntary, and the patient may decline or stop services at any time by request to their care team member.   Care Coordination Consent Status: Patient agreed to services and verbal consent obtained.   Follow up plan:  Telephone appointment with care coordination team member scheduled for:  04/28/22  Encounter Outcome:  Pt. Scheduled  [facility] Coordination Care Guide  Direct Dial: [phone]

## 2022-04-18 ENCOUNTER — Telehealth: Payer: Self-pay

## 2022-04-18 DIAGNOSIS — H401131 Primary open-angle glaucoma, bilateral, mild stage: Secondary | ICD-10-CM | POA: Diagnosis not present

## 2022-04-18 NOTE — Telephone Encounter (Addendum)
Transition Care Management Follow-up Telephone Call Date of discharge and from where: 04/14/2022 [facility]  How have you been since you were released from the hospital? Pt states she is doing okay. She does not feel the need of an apt at the moment.  Any questions or concerns? No  Items Reviewed: Did the pt receive and understand the discharge instructions provided? Yes  Medications obtained and verified? Yes  Other? No  Any new allergies since your discharge? Yes  Dietary orders reviewed? Yes Do you have support at home? Yes   Home Care and Equipment/Supplies: Were home health services ordered? no If so, what is the name of the agency? N/a  Has the agency set up a time to come to the patient's home? no Were any new equipment or medical supplies ordered?  No What is the name of the medical supply agency? N/a Were you able to get the supplies/equipment? no Do you have any questions related to the use of the equipment or supplies? No  Functional Questionnaire: (I = Independent and D = Dependent) ADLs: i  Bathing/Dressing- i  Meal Prep- i  Eating- i  Maintaining continence- i  Transferring/Ambulation- i  Managing Meds- i  Follow up appointments reviewed:  PCP Hospital f/u appt confirmed? Yes  Scheduled to see janece moore on n/a @ n/a. Specialist Hospital f/u appt confirmed? No  Scheduled to see n/a on n/a @ n/a. Are transportation arrangements needed? No  If their condition worsens, is the pt aware to call PCP or go to the Emergency Dept.? Yes Was the patient provided with contact information for the PCP's office or ED? Yes Was to pt encouraged to call back with questions or concerns? Yes

## 2022-04-27 ENCOUNTER — Telehealth: Payer: Self-pay

## 2022-04-27 NOTE — Telephone Encounter (Signed)
VMT requesting call back to discuss starting PREP on 05/09/22.

## 2022-05-05 ENCOUNTER — Ambulatory Visit: Payer: Self-pay

## 2022-05-05 NOTE — Patient Instructions (Signed)
Visit Information  Thank you for taking time to visit with me today. Please don't hesitate to contact me if I can be of assistance to you.   Following are the goals we discussed today:   Goals Addressed               This Visit's Progress     Patient Stated     To lower my LDL Cholesterol (pt-stated)        Care Coordination Interventions: Provider established cholesterol goals reviewed Counseled on importance of regular laboratory monitoring as prescribed Reviewed role and benefits of statin for ASCVD risk reduction Reviewed importance of limiting foods high in cholesterol Reviewed exercise goals and target of 150 minutes per week Mailed printed educational materials related to Managing Cholesterol           Our next appointment is by telephone on 06/02/22 at 12:30 PM  Please call the care guide team at [phone] if you need to cancel or reschedule your appointment.   If you are experiencing a Mental Health or [place] or need someone to talk to, please go to [facility] [address], [place] [id number])  Patient verbalizes understanding of instructions and care plan provided today and agrees to view in [place]. Active MyChart status and patient understanding of how to access instructions and care plan via MyChart confirmed with patient.     Barb Merino, RN, BSN, CCM Care Management Coordinator [facility] Care Management Direct Phone: [phone]

## 2022-05-05 NOTE — Patient Outreach (Signed)
  Care Coordination   Initial Visit Note   05/05/2022 Name: CHAZLYN CUDE MRN: [id number] DOB: November 02, 1950  Luna Fuse Demos is a 72 y.o. year old female who sees [name], [place] for primary care. I spoke with  Debbe Bales by phone today.  What matters to the patients health and wellness today?  Patient would like to work on lowering her LDL Cholesterol.     Goals Addressed               This Visit's Progress     Patient Stated     To lower my LDL Cholesterol (pt-stated)        Care Coordination Interventions: Provider established cholesterol goals reviewed Counseled on importance of regular laboratory monitoring as prescribed Reviewed role and benefits of statin for ASCVD risk reduction Reviewed importance of limiting foods high in cholesterol Reviewed exercise goals and target of 150 minutes per week Mailed printed educational materials related to Managing Cholesterol           SDOH assessments and interventions completed:  No     Care Coordination Interventions:  Yes, provided   Follow up plan: Follow up call scheduled for 06/02/22 @12 :30 PM    Encounter Outcome:  Pt. Visit Completed

## 2022-05-08 ENCOUNTER — Other Ambulatory Visit: Payer: Self-pay | Admitting: Internal Medicine
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
## 2022-05-17 ENCOUNTER — Other Ambulatory Visit: Payer: Self-pay | Admitting: Nurse Practitioner

## 2022-05-18 ENCOUNTER — Ambulatory Visit: Payer: Medicare Other | Attending: Internal Medicine

## 2022-05-18 DIAGNOSIS — E785 Hyperlipidemia, unspecified: Secondary | ICD-10-CM

## 2022-05-18 DIAGNOSIS — Z79899 Other long term (current) drug therapy: Secondary | ICD-10-CM

## 2022-05-19 LAB — HEPATIC FUNCTION PANEL
ALT: 12 IU/L (ref 0–32)
AST: 20 IU/L (ref 0–40)
Albumin: 4.6 g/dL (ref 3.8–4.8)
Alkaline Phosphatase: 69 IU/L (ref 44–121)
Bilirubin Total: 0.4 mg/dL (ref 0.0–1.2)
Bilirubin, Direct: 0.13 mg/dL (ref 0.00–0.40)
Total Protein: 7.4 g/dL (ref 6.0–8.5)

## 2022-05-19 LAB — NMR, LIPOPROFILE
Cholesterol, Total: 166 mg/dL (ref 100–199)
HDL Particle Number: 34.4 umol/L (ref >=30.5)
HDL-C: 59 mg/dL (ref >39)
LDL Particle Number: 1043 nmol/L — ABNORMAL HIGH (ref <1000)
LDL Size: 21.5 nm (ref >20.5)
LDL-C (NIH Calc): 93 mg/dL (ref 0–99)
LP-IR Score: <25 (ref <=45)
Small LDL Particle Number: 323 nmol/L (ref <=527)
Triglycerides: 74 mg/dL (ref 0–149)

## 2022-05-25 ENCOUNTER — Encounter: Payer: Self-pay | Admitting: Nurse Practitioner

## 2022-05-25 ENCOUNTER — Ambulatory Visit
Admission: RE | Admit: 2022-05-25 | Discharge: 2022-05-25 | Disposition: A | Discharge status: Home / Self Care | Payer: Medicare Other | Source: Ambulatory Visit | Attending: Nurse Practitioner | Admitting: Nurse Practitioner

## 2022-05-25 ENCOUNTER — Ambulatory Visit (INDEPENDENT_AMBULATORY_CARE_PROVIDER_SITE_OTHER): Payer: Medicare Other | Admitting: Nurse Practitioner

## 2022-05-25 VITALS — BP 138/80 | HR 64 | Temp 98.0°F | Ht 63.0 in | Wt 261.0 lb

## 2022-05-25 DIAGNOSIS — M25552 Pain in left hip: Secondary | ICD-10-CM

## 2022-05-25 DIAGNOSIS — M79602 Pain in left arm: Secondary | ICD-10-CM | POA: Diagnosis not present

## 2022-05-25 DIAGNOSIS — W19XXXA Unspecified fall, initial encounter: Secondary | ICD-10-CM | POA: Diagnosis not present

## 2022-05-25 MED ORDER — TRIAMCINOLONE ACETONIDE 40 MG/ML IJ SUSP: 40.0000 mg | Freq: Once | INTRAMUSCULAR | Status: DC

## 2022-05-25 NOTE — Patient Instructions (Addendum)
Hip Pain The hip is the joint between the upper legs and the lower pelvis. The bones, cartilage, tendons, and muscles of your hip joint support your body and allow you to move around. Hip pain can range from a minor ache to severe pain in one or both of your hips. The pain may be felt on the inside of the hip joint near the groin, or on the outside near the buttocks and upper thigh. You may also have swelling or stiffness in your hip area. Follow these instructions at home: Managing pain, stiffness, and swelling     If directed, put ice on the painful area. To do this: Put ice in a plastic bag. Place a towel between your skin and the bag. Leave the ice on for 20 minutes, 2-3 times a day. If directed, apply heat to the affected area as often as told by your health care provider. Use the heat source that your health care provider recommends, such as a moist heat pack or a heating pad. Place a towel between your skin and the heat source. Leave the heat on for 20-30 minutes. Remove the heat if your skin turns bright red. This is especially important if you are unable to feel pain, heat, or cold. You may have a greater risk of getting burned. Activity Do exercises as told by your health care provider. Avoid activities that cause pain. General instructions  Take over-the-counter and prescription medicines only as told by your health care provider. Keep a journal of your symptoms. Write down: How often you have hip pain. The location of your pain. What the pain feels like. What makes the pain worse. Sleep with a pillow between your legs on your most comfortable side. Keep all follow-up visits as told by your health care provider. This is important. Contact a health care provider if: You cannot put weight on your leg. Your pain or swelling continues or gets worse after one week. It gets harder to walk. You have a fever. Get help right away if: You fall. You have a sudden increase in pain  and swelling in your hip. Your hip is red or swollen or very tender to touch. Summary Hip pain can range from a minor ache to severe pain in one or both of your hips. The pain may be felt on the inside of the hip joint near the groin, or on the outside near the buttocks and upper thigh. Avoid activities that cause pain. Write down how often you have hip pain, the location of the pain, what makes it worse, and what it feels like. This information is not intended to replace advice given to you by your health care provider. Make sure you discuss any questions you have with your health care provider. Document Revised: 09/03/2018 Document Reviewed: 09/03/2018 Elsevier Patient Education  [place] to [facility] for your xrays the order is in the system

## 2022-05-25 NOTE — Progress Notes (Signed)
I,[name],acting as a Education administrator for [employer], FNP.,have documented all relevant documentation on the behalf of [name], FNP,as directed by  [name], FNP while in the presence of [name], [place].  Subjective:     Patient ID: Rhonda [name] , female    DOB: January 24, 1951 , 72 y.o.   MRN: [id number]   Chief Complaint  Patient presents with   Hip Pain    HPI  Patient presents today for hip pain for the first couple of days she had pain in her hip on left side. Now having pain to her arm as well.   She reports feeling like it is her sciatic nerve. She has been taking tylenol arthritis, she is not having any relief. She is having difficulty with walking. The pain is worse when she is up on her. When she sits has more aching. She had a fall in September and fell on her left side while in the shower.  Dr. Erline Levine did her back surgery in 2012.    Hip Pain  The incident occurred more than 1 week ago. There was no injury mechanism.     Past Medical History:  Diagnosis Date   Anemia    hx none since menopause   Anginal pain ([place])    due to chest wall from cough   Arthritis    "maybe in my legs" (06/15/2016)   Asthma 2018   none since   Complication of anesthesia    woke up during colonoscopy 03-31-16 Dr. Thornton [place]   GERD (gastroesophageal reflux disease)    Heart murmur 2018   History of hiatal hernia    Hyperlipemia    Hypertension    Hypokalemia due to excessive gastrointestinal loss of potassium 12/02/2019   Pre-diabetes 2019   Sinus congestion    "chronic" (06/15/2016)   Sinus headache    "a few/month" (06/15/2016)   Thyroid nodule 2010   "no problems w/them since; on a pill for a while" (06/15/2016)     Family History  Problem Relation Age of Onset   COPD Father    Breast cancer Neg Hx      Current Outpatient Medications:    acetaminophen (TYLENOL) 650 MG CR tablet, Take 1,300 mg by mouth at bedtime as needed for pain. , Disp: , Rfl:    albuterol  (VENTOLIN HFA) 108 (90 Base) MCG/ACT inhaler, Inhale 2 puffs into the lungs every 4 (four) hours as needed for wheezing or shortness of breath., Disp: 18 g, Rfl: 1   Cholecalciferol (VITAMIN D3) 1.25 MG (50000 UT) CAPS, Take 1 tablet by mouth daily at 12 noon., Disp: , Rfl:    dorzolamide-timolol (COSOPT) 22.3-6.8 MG/ML ophthalmic solution, Place 1 drop into both eyes 2 (two) times daily. , Disp: , Rfl:    esomeprazole (NEXIUM) 40 MG capsule, Take 1 capsule (40 mg total) by mouth daily at 12 noon., Disp: 30 capsule, Rfl: 0   fluticasone (FLONASE) 50 MCG/ACT nasal spray, Use 1 spray(s) in each nostril once daily, Disp: 16 g, Rfl: 0   fluticasone-salmeterol (WIXELA INHUB) 250-50 MCG/ACT AEPB, Inhale 1 puff into the lungs in the morning and at bedtime., Disp: 60 each, Rfl: 5   hydrochlorothiazide (HYDRODIURIL) 25 MG tablet, Take 1 tablet (25 mg total) by mouth daily., Disp: 90 tablet, Rfl: 1   levocetirizine (XYZAL) 5 MG tablet, Take 1 tablet (5 mg total) by mouth 2 (two) times daily as needed for allergies (Can use an dose during flare ups.)., Disp:  60 tablet, Rfl: 5   losartan (COZAAR) 100 MG tablet, Take 1 tablet (100 mg total) by mouth daily., Disp: 90 tablet, Rfl: 1   Magnesium 250 MG TABS, Take 1 tablet (250 mg total) by mouth daily., Disp: 30 tablet, Rfl: 0   metoprolol tartrate (LOPRESSOR) 25 MG tablet, Take 1 tablet (25 mg total) by mouth once for 1 dose. Take 90-120 minutes prior to scan., Disp: 1 tablet, Rfl: 0   Multiple Vitamin (MULTIVITAMIN WITH MINERALS) TABS tablet, Take 1 tablet by mouth daily., Disp: , Rfl:    polyethylene glycol (MIRALAX / GLYCOLAX) 17 g packet, Take 17 g by mouth daily as needed for mild constipation., Disp: 14 each, Rfl: 0   potassium chloride (KLOR-CON) 10 MEQ tablet, Take 1 tablet by mouth once daily, Disp: 90 tablet, Rfl: 0   rosuvastatin (CRESTOR) 40 MG tablet, Take 1 tablet (40 mg total) by mouth daily., Disp: 90 tablet, Rfl: 3  Current Facility-Administered  Medications:    triamcinolone acetonide (KENALOG-40) injection 40 mg, 40 mg, Intramuscular, Once, [name], FNP   Allergies  Allergen Reactions   Aspirin-Acetaminophen-Caffeine Nausea And Vomiting   Codeine Diarrhea and Nausea And Vomiting    Stomach cramps    Oxycontin [Oxycodone Hcl] Other (See Comments)    Hallucination      Review of Systems  Constitutional: Negative.   Respiratory: Negative.    Cardiovascular: Negative.   Gastrointestinal: Negative.   Musculoskeletal:        Hip pain Foot pain  Neurological: Negative.      Today's Vitals   05/25/22 1145  BP: 138/80  Pulse: 64  Temp: 98 F (36.7 C)  TempSrc: Oral  Weight: 261 lb (118.4 kg)  Height: 5\' 3"  (1.6 m)  PainSc: 8   PainLoc: Hip   Body mass index is 46.23 kg/m.   Objective:  Physical Exam Vitals reviewed.  Constitutional:      Appearance: Normal appearance.  Cardiovascular:     Rate and Rhythm: Normal rate and regular rhythm.     Pulses: Normal pulses.     Heart sounds: Normal heart sounds. No murmur heard. Pulmonary:     Effort: Pulmonary effort is normal. No respiratory distress.     Breath sounds: Normal breath sounds. No wheezing.  Musculoskeletal:        General: Tenderness (low back tenderness and left hip) present. No swelling. Normal range of motion.  Neurological:     Mental Status: She is alert.         Assessment And Plan:     1. Pain of left upper extremity Comments: She is to go for an xray and will treat with Kenalog. - DG Humerus Left; Future - triamcinolone acetonide (KENALOG-40) injection 40 mg  2. Pain of left hip - DG Hip Unilat W OR W/O Pelvis 2-3 Views Left; Future - triamcinolone acetonide (KENALOG-40) injection 40 mg  3. Fall, initial encounter Comments: Discussed importance of watching where she is walking. Advised to make aware when she has a fall - DG Humerus Left; Future - DG Hip Unilat W OR W/O Pelvis 2-3 Views Left; Future     Patient was  given opportunity to ask questions. Patient verbalized understanding of the plan and was able to repeat key elements of the plan. All questions were answered to their satisfaction.  [place], FNP    I, [name], FNP, have reviewed all documentation for this visit. The documentation on 05/25/22 for the exam, diagnosis, procedures, and  orders are all accurate and complete.  IF YOU HAVE BEEN REFERRED TO A SPECIALIST, IT MAY TAKE 1-2 WEEKS TO SCHEDULE/PROCESS THE REFERRAL. IF YOU HAVE NOT HEARD FROM US/SPECIALIST IN TWO WEEKS, PLEASE GIVE [place] A CALL AT [phone] X [phone].   THE PATIENT IS ENCOURAGED TO PRACTICE SOCIAL DISTANCING DUE TO THE COVID-19 PANDEMIC.

## 2022-05-26 ENCOUNTER — Other Ambulatory Visit: Payer: Self-pay | Admitting: Nurse Practitioner

## 2022-05-27 ENCOUNTER — Other Ambulatory Visit: Payer: Self-pay

## 2022-05-27 MED ORDER — LOSARTAN POTASSIUM 100 MG PO TABS: 100.0000 mg | ORAL_TABLET | Freq: Every day | ORAL | 1 refills | Status: DC

## 2022-06-02 ENCOUNTER — Ambulatory Visit: Payer: Self-pay

## 2022-06-02 NOTE — Patient Outreach (Signed)
  Care Coordination   06/02/2022 Name: ANGELETTE GANUS MRN: [id number] DOB: 10-10-1950   Care Coordination Outreach Attempts:  An unsuccessful telephone outreach was attempted for a scheduled appointment today.  Follow Up Plan:  Additional outreach attempts will be made to offer the patient care coordination information and services.   Encounter Outcome:  No Answer   Care Coordination Interventions:  No, not indicated    Barb Merino, RN, BSN, CCM Care Management Coordinator [facility] Care Management  Direct Phone: [phone]

## 2022-06-02 NOTE — Progress Notes (Signed)
[facility] PREP Weekly Session  Patient Details  Name: Rhonda [name] MRN: [id number] Date of Birth: 1951-02-05 Age: 72 y.o. PCP: [name], FNP  There were no vitals filed for this visit.   [facility] Weekly seesion - 06/02/22 1500       [facility] "PREP" Location   [facility] "PREP" Location [facility]      Weekly Session   Topic Discussed Goal setting and welcome to the program   fit testing   Classes attended to date 2             Barnett Hatter 06/02/2022, 3:57 PM

## 2022-06-07 ENCOUNTER — Other Ambulatory Visit: Payer: Self-pay | Admitting: Nurse Practitioner

## 2022-06-09 DIAGNOSIS — M545 Low back pain, unspecified: Secondary | ICD-10-CM | POA: Diagnosis not present

## 2022-06-09 DIAGNOSIS — M5432 Sciatica, left side: Secondary | ICD-10-CM | POA: Diagnosis not present

## 2022-06-09 DIAGNOSIS — M1612 Unilateral primary osteoarthritis, left hip: Secondary | ICD-10-CM | POA: Diagnosis not present

## 2022-06-16 NOTE — Progress Notes (Signed)
Topic from 2/13 class: Healthy Eating Tips-all other info correct

## 2022-06-16 NOTE — Progress Notes (Signed)
[facility] PREP Weekly Session  Patient Details  Name: Rhonda [name] MRN: [id number] Date of Birth: [birth year] Age: 72 y.o. PCP: [name], FNP  Vitals:   06/14/22 1300  Weight: 257 lb 12.8 oz (116.9 kg)     [facility] Weekly seesion - 06/16/22 1100       [facility] "PREP" Location   [facility] "PREP" Location [facility]      Weekly Session   Topic Discussed Importance of resistance training;Other ways to be active    Classes attended to date [place] 06/16/2022, 11:34 AM

## 2022-06-23 ENCOUNTER — Encounter (HOSPITAL_COMMUNITY): Payer: Self-pay

## 2022-06-23 ENCOUNTER — Telehealth: Payer: Self-pay | Admitting: Internal Medicine

## 2022-06-23 ENCOUNTER — Observation Stay (HOSPITAL_COMMUNITY)
Admission: EM | Admit: 2022-06-23 | Discharge: 2022-06-25 | Disposition: A | Discharge status: Home / Self Care | Payer: Medicare Other | Attending: Internal Medicine | Admitting: Internal Medicine

## 2022-06-23 ENCOUNTER — Observation Stay (HOSPITAL_COMMUNITY): Payer: Medicare Other

## 2022-06-23 DIAGNOSIS — H409 Unspecified glaucoma: Secondary | ICD-10-CM | POA: Insufficient documentation

## 2022-06-23 DIAGNOSIS — E782 Mixed hyperlipidemia: Secondary | ICD-10-CM

## 2022-06-23 DIAGNOSIS — J309 Allergic rhinitis, unspecified: Secondary | ICD-10-CM | POA: Diagnosis present

## 2022-06-23 DIAGNOSIS — K219 Gastro-esophageal reflux disease without esophagitis: Secondary | ICD-10-CM | POA: Diagnosis present

## 2022-06-23 DIAGNOSIS — R778 Other specified abnormalities of plasma proteins: Secondary | ICD-10-CM | POA: Insufficient documentation

## 2022-06-23 DIAGNOSIS — R7989 Other specified abnormal findings of blood chemistry: Secondary | ICD-10-CM | POA: Diagnosis not present

## 2022-06-23 DIAGNOSIS — R42 Dizziness and giddiness: Secondary | ICD-10-CM | POA: Diagnosis not present

## 2022-06-23 DIAGNOSIS — Z79899 Other long term (current) drug therapy: Secondary | ICD-10-CM | POA: Insufficient documentation

## 2022-06-23 DIAGNOSIS — R000 Tachycardia, unspecified: Secondary | ICD-10-CM | POA: Diagnosis not present

## 2022-06-23 DIAGNOSIS — I479 Paroxysmal tachycardia, unspecified: Principal | ICD-10-CM | POA: Insufficient documentation

## 2022-06-23 DIAGNOSIS — R002 Palpitations: Secondary | ICD-10-CM | POA: Diagnosis not present

## 2022-06-23 DIAGNOSIS — J4540 Moderate persistent asthma, uncomplicated: Secondary | ICD-10-CM | POA: Diagnosis present

## 2022-06-23 DIAGNOSIS — I10 Essential (primary) hypertension: Secondary | ICD-10-CM | POA: Diagnosis present

## 2022-06-23 DIAGNOSIS — R0789 Other chest pain: Secondary | ICD-10-CM | POA: Diagnosis present

## 2022-06-23 DIAGNOSIS — I214 Non-ST elevation (NSTEMI) myocardial infarction: Secondary | ICD-10-CM | POA: Diagnosis not present

## 2022-06-23 DIAGNOSIS — E785 Hyperlipidemia, unspecified: Secondary | ICD-10-CM | POA: Diagnosis present

## 2022-06-23 DIAGNOSIS — I491 Atrial premature depolarization: Secondary | ICD-10-CM | POA: Diagnosis not present

## 2022-06-23 DIAGNOSIS — R079 Chest pain, unspecified: Secondary | ICD-10-CM | POA: Diagnosis not present

## 2022-06-23 DIAGNOSIS — I4719 Other supraventricular tachycardia: Secondary | ICD-10-CM | POA: Diagnosis not present

## 2022-06-23 LAB — CBC WITH DIFFERENTIAL/PLATELET
Abs Immature Granulocytes: 0.04 10*3/uL (ref 0.00–0.07)
Basophils Absolute: 0 10*3/uL (ref 0.0–0.1)
Basophils Relative: 0 %
Eosinophils Absolute: 0.2 10*3/uL (ref 0.0–0.5)
Eosinophils Relative: 2 %
HCT: 41 % (ref 36.0–46.0)
Hemoglobin: 13.9 g/dL (ref 12.0–15.0)
Immature Granulocytes: 1 %
Lymphocytes Relative: 30 %
Lymphs Abs: 2.5 10*3/uL (ref 0.7–4.0)
MCH: 29.6 pg (ref 26.0–34.0)
MCHC: 33.9 g/dL (ref 30.0–36.0)
MCV: 87.4 fL (ref 80.0–100.0)
Monocytes Absolute: 0.6 10*3/uL (ref 0.1–1.0)
Monocytes Relative: 7 %
Neutro Abs: 4.9 10*3/uL (ref 1.7–7.7)
Neutrophils Relative %: 60 %
Platelets: 276 10*3/uL (ref 150–400)
RBC: 4.69 MIL/uL (ref 3.87–5.11)
RDW: 15 % (ref 11.5–15.5)
WBC: 8.2 10*3/uL (ref 4.0–10.5)
nRBC: 0 % (ref 0.0–0.2)

## 2022-06-23 LAB — RAPID URINE DRUG SCREEN, HOSP PERFORMED
Amphetamines: NOT DETECTED
Barbiturates: NOT DETECTED
Benzodiazepines: NOT DETECTED
Cocaine: NOT DETECTED
Opiates: NOT DETECTED
Tetrahydrocannabinol: NOT DETECTED

## 2022-06-23 LAB — TSH: TSH: 1.187 u[IU]/mL (ref 0.350–4.500)

## 2022-06-23 LAB — URINALYSIS, ROUTINE W REFLEX MICROSCOPIC
Bilirubin Urine: NEGATIVE
Glucose, UA: NEGATIVE mg/dL
Hgb urine dipstick: NEGATIVE
Ketones, ur: 5 mg/dL — AB
Nitrite: NEGATIVE
Protein, ur: NEGATIVE mg/dL
Specific Gravity, Urine: 1.012 (ref 1.005–1.030)
pH: 7 (ref 5.0–8.0)

## 2022-06-23 LAB — COMPREHENSIVE METABOLIC PANEL
ALT: 25 U/L (ref 0–44)
AST: 29 U/L (ref 15–41)
Albumin: 3.8 g/dL (ref 3.5–5.0)
Alkaline Phosphatase: 63 U/L (ref 38–126)
Anion gap: 10 (ref 5–15)
BUN: 29 mg/dL — ABNORMAL HIGH (ref 8–23)
CO2: 28 mmol/L (ref 22–32)
Calcium: 9.4 mg/dL (ref 8.9–10.3)
Chloride: 98 mmol/L (ref 98–111)
Creatinine, Ser: 1.13 mg/dL — ABNORMAL HIGH (ref 0.44–1.00)
GFR, Estimated: 51 mL/min — ABNORMAL LOW (ref >60)
Glucose, Bld: 123 mg/dL — ABNORMAL HIGH (ref 70–99)
Potassium: 3.7 mmol/L (ref 3.5–5.1)
Sodium: 136 mmol/L (ref 135–145)
Total Bilirubin: 0.9 mg/dL (ref 0.3–1.2)
Total Protein: 6.8 g/dL (ref 6.5–8.1)

## 2022-06-23 LAB — TROPONIN I (HIGH SENSITIVITY)
Troponin I (High Sensitivity): 71 ng/L — ABNORMAL HIGH (ref <18)
Troponin I (High Sensitivity): 259 ng/L (ref <18)

## 2022-06-23 MED ORDER — POTASSIUM CHLORIDE CRYS ER 20 MEQ PO TBCR
40.0000 meq | EXTENDED_RELEASE_TABLET | Freq: Once | ORAL | Status: AC
  Administered 2022-06-24: 40 meq via ORAL
  Filled 2022-06-23: qty 2

## 2022-06-23 MED ORDER — ASPIRIN 325 MG PO TABS
325.0000 mg | ORAL_TABLET | Freq: Once | ORAL | Status: AC
  Administered 2022-06-24: 325 mg via ORAL
  Filled 2022-06-23: qty 1

## 2022-06-23 MED ORDER — MELATONIN 3 MG PO TABS: 3.0000 mg | ORAL_TABLET | Freq: Every evening | ORAL | Status: DC | PRN

## 2022-06-23 MED ORDER — METOPROLOL TARTRATE 12.5 MG HALF TABLET
12.5000 mg | ORAL_TABLET | Freq: Two times a day (BID) | ORAL | Status: DC
  Administered 2022-06-24 – 2022-06-25 (×4): 12.5 mg via ORAL
  Filled 2022-06-23 (×4): qty 1

## 2022-06-23 MED ORDER — FLUTICASONE PROPIONATE 50 MCG/ACT NA SUSP
1.0000 | Freq: Every day | NASAL | Status: DC
  Administered 2022-06-24 – 2022-06-25 (×2): 1 via NASAL
  Filled 2022-06-23: qty 16

## 2022-06-23 MED ORDER — METOPROLOL TARTRATE 25 MG PO TABS: 25.0000 mg | ORAL_TABLET | Freq: Two times a day (BID) | ORAL | Status: DC

## 2022-06-23 MED ORDER — LOSARTAN POTASSIUM 50 MG PO TABS
100.0000 mg | ORAL_TABLET | Freq: Every day | ORAL | Status: DC
  Administered 2022-06-24 – 2022-06-25 (×2): 100 mg via ORAL
  Filled 2022-06-23 (×2): qty 2

## 2022-06-23 MED ORDER — PANTOPRAZOLE SODIUM 40 MG PO TBEC
40.0000 mg | DELAYED_RELEASE_TABLET | Freq: Every day | ORAL | Status: DC
  Administered 2022-06-24 – 2022-06-25 (×2): 40 mg via ORAL
  Filled 2022-06-23 (×2): qty 1

## 2022-06-23 MED ORDER — ROSUVASTATIN CALCIUM 20 MG PO TABS
40.0000 mg | ORAL_TABLET | Freq: Every day | ORAL | Status: DC
  Administered 2022-06-24 – 2022-06-25 (×2): 40 mg via ORAL
  Filled 2022-06-23 (×2): qty 2

## 2022-06-23 MED ORDER — LEVALBUTEROL HCL 1.25 MG/0.5ML IN NEBU: 1.2500 mg | INHALATION_SOLUTION | RESPIRATORY_TRACT | Status: DC | PRN

## 2022-06-23 MED ORDER — MOMETASONE FURO-FORMOTEROL FUM 200-5 MCG/ACT IN AERO
2.0000 | INHALATION_SPRAY | Freq: Two times a day (BID) | RESPIRATORY_TRACT | Status: DC
  Administered 2022-06-24 – 2022-06-25 (×3): 2 via RESPIRATORY_TRACT
  Filled 2022-06-23 (×2): qty 8.8

## 2022-06-23 MED ORDER — ACETAMINOPHEN 650 MG RE SUPP: 650.0000 mg | Freq: Four times a day (QID) | RECTAL | Status: DC | PRN

## 2022-06-23 MED ORDER — DORZOLAMIDE HCL-TIMOLOL MAL 2-0.5 % OP SOLN
1.0000 [drp] | Freq: Two times a day (BID) | OPHTHALMIC | Status: DC
  Administered 2022-06-24 – 2022-06-25 (×3): 1 [drp] via OPHTHALMIC
  Filled 2022-06-23: qty 10

## 2022-06-23 MED ORDER — ACETAMINOPHEN 325 MG PO TABS
650.0000 mg | ORAL_TABLET | Freq: Four times a day (QID) | ORAL | Status: DC | PRN
  Administered 2022-06-24: 650 mg via ORAL
  Filled 2022-06-23: qty 2

## 2022-06-23 NOTE — ED Provider Notes (Signed)
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 [facility])     '@PCDICTATION'$ @    Teressa Lower, MD 06/24/22 1457
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
## 2022-06-23 NOTE — Telephone Encounter (Signed)
Pt c/o of Chest Pain: STAT if CP now or developed within 24 hours  1. Are you having CP right now? Yes  2. Are you experiencing any other symptoms (ex. SOB, nausea, vomiting, sweating)? Sweating and SOB   3. How long have you been experiencing CP? For a little bit she states, but never this bad   4. Is your CP continuous or coming and going? Coming and going   5. Have you taken Nitroglycerin? No.  ?

## 2022-06-23 NOTE — Telephone Encounter (Signed)
Patient called currently experiencing  body shakes, sweating, chest pain for the past 5-10 min. She was advised to go to the nearest emergency room or call 911. Offered to call 911 for the patient she declined. Reintegrated to immediately seek emergency care. Pt. Stated her daughter was with her and will go to the ER. Will forward MD and nurse

## 2022-06-23 NOTE — ED Notes (Signed)
Pt resting in bed with no acute distress noted at this time.

## 2022-06-23 NOTE — ED Notes (Signed)
Trop of 259 reported to ED MD
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
## 2022-06-24 ENCOUNTER — Observation Stay (HOSPITAL_BASED_OUTPATIENT_CLINIC_OR_DEPARTMENT_OTHER): Payer: Medicare Other

## 2022-06-24 ENCOUNTER — Other Ambulatory Visit: Payer: Self-pay | Admitting: Student

## 2022-06-24 ENCOUNTER — Encounter (HOSPITAL_COMMUNITY): Payer: Self-pay | Admitting: Internal Medicine

## 2022-06-24 ENCOUNTER — Other Ambulatory Visit: Payer: Self-pay

## 2022-06-24 DIAGNOSIS — R079 Chest pain, unspecified: Secondary | ICD-10-CM | POA: Diagnosis not present

## 2022-06-24 DIAGNOSIS — I4719 Other supraventricular tachycardia: Secondary | ICD-10-CM

## 2022-06-24 LAB — COMPREHENSIVE METABOLIC PANEL
ALT: 24 U/L (ref 0–44)
AST: 25 U/L (ref 15–41)
Albumin: 3.6 g/dL (ref 3.5–5.0)
Alkaline Phosphatase: 58 U/L (ref 38–126)
Anion gap: 8 (ref 5–15)
BUN: 24 mg/dL — ABNORMAL HIGH (ref 8–23)
CO2: 27 mmol/L (ref 22–32)
Calcium: 9 mg/dL (ref 8.9–10.3)
Chloride: 100 mmol/L (ref 98–111)
Creatinine, Ser: 0.88 mg/dL (ref 0.44–1.00)
GFR, Estimated: >60 mL/min (ref >60)
Glucose, Bld: 139 mg/dL — ABNORMAL HIGH (ref 70–99)
Potassium: 3.3 mmol/L — ABNORMAL LOW (ref 3.5–5.1)
Sodium: 135 mmol/L (ref 135–145)
Total Bilirubin: 0.8 mg/dL (ref 0.3–1.2)
Total Protein: 6.4 g/dL — ABNORMAL LOW (ref 6.5–8.1)

## 2022-06-24 LAB — ECHOCARDIOGRAM COMPLETE
Area-P 1/2: 3.31 cm2
Height: 63 in
S' Lateral: 2.1 cm
Weight: 4160 oz

## 2022-06-24 LAB — CBC WITH DIFFERENTIAL/PLATELET
Abs Immature Granulocytes: 0.05 10*3/uL (ref 0.00–0.07)
Basophils Absolute: 0 10*3/uL (ref 0.0–0.1)
Basophils Relative: 0 %
Eosinophils Absolute: 0.1 10*3/uL (ref 0.0–0.5)
Eosinophils Relative: 2 %
HCT: 37.5 % (ref 36.0–46.0)
Hemoglobin: 12.6 g/dL (ref 12.0–15.0)
Immature Granulocytes: 1 %
Lymphocytes Relative: 40 %
Lymphs Abs: 3.2 10*3/uL (ref 0.7–4.0)
MCH: 29.3 pg (ref 26.0–34.0)
MCHC: 33.6 g/dL (ref 30.0–36.0)
MCV: 87.2 fL (ref 80.0–100.0)
Monocytes Absolute: 0.6 10*3/uL (ref 0.1–1.0)
Monocytes Relative: 8 %
Neutro Abs: 4 10*3/uL (ref 1.7–7.7)
Neutrophils Relative %: 49 %
Platelets: 274 10*3/uL (ref 150–400)
RBC: 4.3 MIL/uL (ref 3.87–5.11)
RDW: 15.1 % (ref 11.5–15.5)
WBC: 8.1 10*3/uL (ref 4.0–10.5)
nRBC: 0 % (ref 0.0–0.2)

## 2022-06-24 LAB — LIPASE, BLOOD: Lipase: 34 U/L (ref 11–51)

## 2022-06-24 LAB — MAGNESIUM
Magnesium: 2.1 mg/dL (ref 1.7–2.4)
Magnesium: 2.1 mg/dL (ref 1.7–2.4)

## 2022-06-24 LAB — TROPONIN I (HIGH SENSITIVITY): Troponin I (High Sensitivity): 286 ng/L (ref <18)

## 2022-06-24 LAB — BRAIN NATRIURETIC PEPTIDE: B Natriuretic Peptide: 25.1 pg/mL (ref 0.0–100.0)

## 2022-06-24 MED ORDER — PREDNISONE 10 MG PO TABS: 10.0000 mg | ORAL_TABLET | ORAL | Status: DC

## 2022-06-24 NOTE — Care Management (Signed)
  Transition of Care [facility]) Screening Note   Patient Details  Name: Rhonda [name] Date of Birth: May 13, 1950   Transition of Care [facility]) CM/SW Contact:    Bethena Roys, RN Phone Number: 06/24/2022, 3:04 PM    Transition of Care Department [facility]) has reviewed the patient and no TOC needs have been identified at this time. PTA patient was from home with her mother. 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 Patient reports that she is using a cane in the home. Case Manager will continue to monitor patient advancement through interdisciplinary progression rounds. If new patient transition needs arise, please place a TOC consult.

## 2022-06-24 NOTE — Care Management Obs Status (Signed)
[place] NOTIFICATION   Patient Details  Name: Rhonda [name] MRN: [id number] Date of Birth: 1950/08/21   Medicare Observation Status Notification Given:  Yes    Bethena Roys, RN 06/24/2022, 3:03 PM

## 2022-06-24 NOTE — Progress Notes (Signed)
Ordered 30 day event monitor for further evaluation of paroxysmal atrial tachycardia. Please see consult not from today for more information.  Darreld Mclean, PA-C 06/24/2022 2:42 PM
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
## 2022-06-24 NOTE — Progress Notes (Signed)
Echocardiogram 2D Echocardiogram has been performed.  Oneal Deputy Graycie Halley RDCS 06/24/2022, 11:45 AM
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
## 2022-06-24 NOTE — Progress Notes (Signed)
Progress Note   Patient: Rhonda [name] [id number] DOB: February 17, 1951 DOA: 06/23/2022     0 DOS: the patient was seen and examined on 06/24/2022   Brief hospital course: Patient with h/o HTN, HLD, and asthma presenting with chest pain and found to be in PAT.  Her beta blocker was discontinued in the last few months.  Initial HR 110-120s, spontaneously converted.  Mildly elevated troponin.  Started on metoprolol.  Echo pending.   Assessment and Plan: No notes have been filed under this hospital service. Service: Hospitalist  Atrial tachycardia -h/o palpitations, last seen in 06/2021 -Persistent PAT at the time of my evaluation -Started on metoprolol, will follow on telemetry overnight -Cardiology consult -will monitor one additional night, likely dc to home tomorrow if PAT is rate controlled  Chest pain with elevated troponin -Atypical in nature by description but with uptrending troponin -Prior coronary CTA in 06/2021 -Thought to be related to demand ischemia -Cardiology is not planning to do further ischemic evaluation at this time  Moderate persistent asthma -has been on prednisone, which is likely contributing to symptoms -Continue [place]  HTN -Continue losartan -Started on metoprolol -Hold HCTZ for now  HLD -Continue atorvastatin  Glaucoma -Continue Cosopt     Subjective: Continuing to have symptomatic tachycardia.  She is followed for this by Dr. Harrington Challenger but does not think she has been on BB therapy in the past.  Very remote h/o stress testing.   Physical Exam: Vitals:   06/24/22 0600 06/24/22 0700 06/24/22 1300 06/24/22 1358  BP: 127/65 126/69 131/75 109/70  Pulse: 65 69 65 73  Resp: '18 16 14 16  '$ Temp:   98.5 F (36.9 C) 97.8 F (36.6 C)  TempSrc:   Oral Oral  SpO2: 97% 99% 97% 100%  Weight:      Height:       General:  Appears calm and comfortable and is in NAD, sitting up at bedside Eyes:  EOMI, normal lids, iris ENT:  grossly normal hearing,  lips & tongue, mmm Neck:  no LAD, masses or thyromegaly Cardiovascular:  RR with tachycardia, no m/r/g.  No LE edema Respiratory:   CTA bilaterally with no wheezes/rales/rhonchi.  Normal respiratory effort. Abdomen:  soft, NT, ND Skin:  no rash or induration seen on limited exam Musculoskeletal:  grossly normal tone BUE/BLE, good ROM, no bony abnormality Psychiatric:  grossly normal mood and affect, speech fluent and appropriate, AOx3 Neurologic:  CN 2-12 grossly intact, moves all extremities in coordinated fashion   Radiological Exams on Admission: Independently reviewed - see discussion in A/P where applicable  ECHOCARDIOGRAM COMPLETE  Result Date: 06/24/2022    ECHOCARDIOGRAM REPORT   Patient Name:   Rhonda [name] Date of Exam: 06/24/2022 Medical Rec #:  [id number]          Height:       63.0 in Accession #:    [id number]         Weight:       260.0 lb Date of Birth:  August 03, 1950          BSA:          2.162 m Patient Age:    72 years           BP:           126/69 mmHg Patient Gender: F                  HR:  61 bpm. Exam Location:  Inpatient Procedure: 2D Echo, Color Doppler and Cardiac Doppler Indications:    R07.9* Chest pain, unspecified  History:        Patient has prior history of Echocardiogram examinations, most                 recent 07/06/2021. Pulmonary HTN; Risk Factors:Hypertension and                 Dyslipidemia.  Sonographer:    Raquel Sarna Senior RDCS Referring Phys: [id number] Rhetta Mura  Sonographer Comments: Tehnically difficult due to patient body habitu, apicals performed over the breast IMPRESSIONS  1. Left ventricular ejection fraction, by estimation, is 65 to 70%. The left ventricle has normal function. The left ventricle has no regional wall motion abnormalities. Left ventricular diastolic parameters are consistent with Grade II diastolic dysfunction (pseudonormalization).  2. Right ventricular systolic function is normal. The right ventricular size is normal.  3.  The mitral valve is grossly normal. Trivial mitral valve regurgitation.  4. The aortic valve is tricuspid. There is mild calcification of the aortic valve. There is mild thickening of the aortic valve. Aortic valve regurgitation is not visualized. Aortic valve sclerosis/calcification is present, without any evidence of aortic stenosis.  5. The inferior vena cava is normal in size with greater than 50% respiratory variability, suggesting right atrial pressure of 3 mmHg. Comparison(s): No significant change from prior study. FINDINGS  Left Ventricle: Left ventricular ejection fraction, by estimation, is 65 to 70%. The left ventricle has normal function. The left ventricle has no regional wall motion abnormalities. The left ventricular internal cavity size was normal in size. There is  no left ventricular hypertrophy. Left ventricular diastolic parameters are consistent with Grade II diastolic dysfunction (pseudonormalization). Right Ventricle: The right ventricular size is normal. No increase in right ventricular wall thickness. Right ventricular systolic function is normal. Left Atrium: Left atrial size was normal in size. Right Atrium: Right atrial size was normal in size. Pericardium: There is no evidence of pericardial effusion. Mitral Valve: The mitral valve is grossly normal. Trivial mitral valve regurgitation. Tricuspid Valve: The tricuspid valve is normal in structure. Tricuspid valve regurgitation is not demonstrated. Aortic Valve: The aortic valve is tricuspid. There is mild calcification of the aortic valve. There is mild thickening of the aortic valve. Aortic valve regurgitation is not visualized. Aortic valve sclerosis/calcification is present, without any evidence of aortic stenosis. Pulmonic Valve: The pulmonic valve was normal in structure. Pulmonic valve regurgitation is trivial. Aorta: The aortic root is normal in size and structure. Venous: The inferior vena cava is normal in size with greater than  50% respiratory variability, suggesting right atrial pressure of 3 mmHg. IAS/Shunts: The atrial septum is grossly normal.  LEFT VENTRICLE PLAX 2D LVIDd:         3.30 cm   Diastology LVIDs:         2.10 cm   LV e' medial:    5.77 cm/s LV PW:         0.90 cm   LV E/e' medial:  12.7 LV IVS:        1.00 cm   LV e' lateral:   6.42 cm/s LVOT diam:     1.80 cm   LV E/e' lateral: 11.4 LV SV:         47 LV SV Index:   22 LVOT Area:     2.54 cm  RIGHT VENTRICLE RV S prime:     10.30  cm/s TAPSE (M-mode): 1.4 cm LEFT ATRIUM             Index        RIGHT ATRIUM           Index LA diam:        2.90 cm 1.34 cm/m   RA Area:     10.50 cm LA Vol (A2C):   35.9 ml 16.61 ml/m  RA Volume:   19.10 ml  8.84 ml/m LA Vol (A4C):   50.9 ml 23.55 ml/m LA Biplane Vol: 43.0 ml 19.89 ml/m  AORTIC VALVE LVOT Vmax:   97.90 cm/s LVOT Vmean:  67.800 cm/s LVOT VTI:    0.183 m  AORTA Ao Root diam: 2.50 cm Ao Asc diam:  2.80 cm MITRAL VALVE MV Area (PHT): 3.31 cm    SHUNTS MV Decel Time: 229 msec    Systemic VTI:  0.18 m MV E velocity: 73.10 cm/s  Systemic Diam: 1.80 cm MV A velocity: 78.50 cm/s MV E/A ratio:  0.93 Gwyndolyn Kaufman MD Electronically signed by Gwyndolyn Kaufman MD Signature Date/Time: 06/24/2022/12:48:58 PM    Final    DG Chest Port 1 View  Result Date: 06/23/2022 CLINICAL DATA:  Paroxysmal atrial tachycardia EXAM: PORTABLE CHEST 1 VIEW COMPARISON:  12/13/2021 FINDINGS: The heart size and mediastinal contours are within normal limits. Both lungs are clear. The visualized skeletal structures are unremarkable. IMPRESSION: No active disease. Electronically Signed   By: Inez Catalina M.D.   On: 06/23/2022 23:43    EKG: Independently reviewed.  NSR with rate 82 with PACs; no evidence of acute ischemia   Labs on Admission: I have personally reviewed the available labs and imaging studies at the time of the admission.  Pertinent labs:    K+ 3.3 Glucose 139 HS troponin 71, 259, 286 Normal CBC UA: 5 ketones, small LE UDS  negative   Family Communication: None present; she is capable of communicating with family at this time  Disposition: Status is: Observation The patient remains OBS appropriate and will d/c before 2 midnights.  Planned Discharge Destination: Home    Time spent: 50 minutes  Author: Karmen Bongo, MD 06/24/2022 5:47 PM  For on call review [email or web address].

## 2022-06-25 DIAGNOSIS — R778 Other specified abnormalities of plasma proteins: Secondary | ICD-10-CM | POA: Diagnosis not present

## 2022-06-25 DIAGNOSIS — J4540 Moderate persistent asthma, uncomplicated: Secondary | ICD-10-CM | POA: Diagnosis not present

## 2022-06-25 DIAGNOSIS — H409 Unspecified glaucoma: Secondary | ICD-10-CM | POA: Diagnosis not present

## 2022-06-25 DIAGNOSIS — I479 Paroxysmal tachycardia, unspecified: Secondary | ICD-10-CM | POA: Diagnosis not present

## 2022-06-25 DIAGNOSIS — I10 Essential (primary) hypertension: Secondary | ICD-10-CM | POA: Diagnosis not present

## 2022-06-25 DIAGNOSIS — I214 Non-ST elevation (NSTEMI) myocardial infarction: Secondary | ICD-10-CM | POA: Diagnosis not present

## 2022-06-25 DIAGNOSIS — Z79899 Other long term (current) drug therapy: Secondary | ICD-10-CM | POA: Diagnosis not present

## 2022-06-25 DIAGNOSIS — I4719 Other supraventricular tachycardia: Secondary | ICD-10-CM | POA: Diagnosis not present

## 2022-06-25 LAB — BASIC METABOLIC PANEL
Anion gap: 10 (ref 5–15)
BUN: 22 mg/dL (ref 8–23)
CO2: 25 mmol/L (ref 22–32)
Calcium: 9 mg/dL (ref 8.9–10.3)
Chloride: 99 mmol/L (ref 98–111)
Creatinine, Ser: 0.99 mg/dL (ref 0.44–1.00)
GFR, Estimated: >60 mL/min (ref >60)
Glucose, Bld: 149 mg/dL — ABNORMAL HIGH (ref 70–99)
Potassium: 3.6 mmol/L (ref 3.5–5.1)
Sodium: 134 mmol/L — ABNORMAL LOW (ref 135–145)

## 2022-06-25 MED ORDER — METOPROLOL TARTRATE 25 MG PO TABS: 12.5000 mg | ORAL_TABLET | Freq: Two times a day (BID) | ORAL | 1 refills | Status: DC

## 2022-06-25 MED ORDER — SENNOSIDES-DOCUSATE SODIUM 8.6-50 MG PO TABS: 2.0000 | ORAL_TABLET | Freq: Two times a day (BID) | ORAL | 1 refills | Status: DC

## 2022-06-25 MED ORDER — SENNOSIDES-DOCUSATE SODIUM 8.6-50 MG PO TABS
1.0000 | ORAL_TABLET | Freq: Every day | ORAL | Status: DC
  Administered 2022-06-25: 1 via ORAL
  Filled 2022-06-25: qty 1

## 2022-06-25 MED ORDER — SENNOSIDES-DOCUSATE SODIUM 8.6-50 MG PO TABS
2.0000 | ORAL_TABLET | Freq: Two times a day (BID) | ORAL | Status: DC
  Administered 2022-06-25: 2 via ORAL
  Filled 2022-06-25 (×2): qty 2

## 2022-06-25 MED ORDER — PROCHLORPERAZINE EDISYLATE 10 MG/2ML IJ SOLN
5.0000 mg | Freq: Four times a day (QID) | INTRAMUSCULAR | Status: DC | PRN
  Administered 2022-06-25: 5 mg via INTRAVENOUS
  Filled 2022-06-25: qty 2

## 2022-06-25 MED ORDER — POLYETHYLENE GLYCOL 3350 17 G PO PACK
17.0000 g | PACK | Freq: Every day | ORAL | Status: DC | PRN
  Administered 2022-06-25: 17 g via ORAL
  Filled 2022-06-25: qty 1

## 2022-06-25 NOTE — Progress Notes (Signed)
Progress Note  Patient Name: Rhonda [name] Date of Encounter: 06/25/2022  Primary Cardiologist: Dorris Carnes, MD  Subjective   Feels better overall, no chest pain or palpitations this morning.  Did have some constipation and abdominal pain last night, presently resolved.  Inpatient Medications    Scheduled Meds:  dorzolamide-timolol  1 drop Both Eyes BID   fluticasone  1 spray Each Nare Daily   losartan  100 mg Oral Daily   metoprolol tartrate  12.5 mg Oral BID   mometasone-formoterol  2 puff Inhalation BID   pantoprazole  40 mg Oral Daily   rosuvastatin  40 mg Oral Daily   senna-docusate  2 tablet Oral BID    PRN Meds: acetaminophen **OR** acetaminophen, levalbuterol, melatonin, polyethylene glycol, prochlorperazine   Vital Signs    Vitals:   06/24/22 2009 06/25/22 0000 06/25/22 0526 06/25/22 0814  BP:  118/60 (!) 133/57 (!) 146/86  Pulse:  78 69 73  Resp:  '18 16 16  '$ Temp:  97.8 F (36.6 C) 97.9 F (36.6 C) 97.6 F (36.4 C)  TempSrc:  Oral Oral Oral  SpO2: 100%  99% 100%  Weight:   114.6 kg   Height:       No intake or output data in the 24 hours ending 06/25/22 0911 Filed Weights   06/23/22 1712 06/25/22 0526  Weight: 117.9 kg 114.6 kg    Telemetry    Sinus rhythm with bursts of SVT.  Personally reviewed.  ECG    An ECG dated 06/25/2022 was personally reviewed today and demonstrated:  Sinus rhythm with prolonged PR interval, incomplete right bundle branch block, nonspecific ST-T changes.  Physical Exam   GEN: No acute distress.   Neck: No JVD. Cardiac: RRR, no murmur, rub, or gallop.  Respiratory: Nonlabored. Clear to auscultation bilaterally. GI: Soft, nontender, bowel sounds present. MS: No edema.  Labs    Chemistry Recent Labs  Lab 06/23/22 1704 06/23/22 2356  NA 136 135  K 3.7 3.3*  CL 98 100  CO2 28 27  GLUCOSE 123* 139*  BUN 29* 24*  CREATININE 1.13* 0.88  CALCIUM 9.4 9.0  PROT 6.8 6.4*  ALBUMIN 3.8 3.6  AST 29 25  ALT  25 24  ALKPHOS 63 58  BILITOT 0.9 0.8  GFRNONAA 51* >60  ANIONGAP 10 8     Hematology Recent Labs  Lab 06/23/22 1704 06/23/22 2356  WBC 8.2 8.1  RBC 4.69 4.30  HGB 13.9 12.6  HCT 41.0 37.5  MCV 87.4 87.2  MCH 29.6 29.3  MCHC 33.9 33.6  RDW 15.0 15.1  PLT 276 274    Cardiac Enzymes Recent Labs  Lab 06/23/22 1704 06/23/22 1915 06/24/22 0645  TROPONINIHS 71* 259* 286*    BNP Recent Labs  Lab 06/23/22 1704  BNP 25.1     Radiology    ECHOCARDIOGRAM COMPLETE  Result Date: 06/24/2022    ECHOCARDIOGRAM REPORT   Patient Name:   Rhonda [name] Date of Exam: 06/24/2022 Medical Rec #:  [id number]          Height:       63.0 in Accession #:    [id number]         Weight:       260.0 lb Date of Birth:  01-Sep-1950          BSA:          2.162 m Patient Age:    72 years  BP:           126/69 mmHg Patient Gender: F                  HR:           61 bpm. Exam Location:  Inpatient Procedure: 2D Echo, Color Doppler and Cardiac Doppler Indications:    R07.9* Chest pain, unspecified  History:        Patient has prior history of Echocardiogram examinations, most                 recent 07/06/2021. Pulmonary HTN; Risk Factors:Hypertension and                 Dyslipidemia.  Sonographer:    Raquel Sarna Senior RDCS Referring Phys: [id number] Rhetta Mura  Sonographer Comments: Tehnically difficult due to patient body habitu, apicals performed over the breast IMPRESSIONS  1. Left ventricular ejection fraction, by estimation, is 65 to 70%. The left ventricle has normal function. The left ventricle has no regional wall motion abnormalities. Left ventricular diastolic parameters are consistent with Grade II diastolic dysfunction (pseudonormalization).  2. Right ventricular systolic function is normal. The right ventricular size is normal.  3. The mitral valve is grossly normal. Trivial mitral valve regurgitation.  4. The aortic valve is tricuspid. There is mild calcification of the aortic valve. There  is mild thickening of the aortic valve. Aortic valve regurgitation is not visualized. Aortic valve sclerosis/calcification is present, without any evidence of aortic stenosis.  5. The inferior vena cava is normal in size with greater than 50% respiratory variability, suggesting right atrial pressure of 3 mmHg. Comparison(s): No significant change from prior study. FINDINGS  Left Ventricle: Left ventricular ejection fraction, by estimation, is 65 to 70%. The left ventricle has normal function. The left ventricle has no regional wall motion abnormalities. The left ventricular internal cavity size was normal in size. There is  no left ventricular hypertrophy. Left ventricular diastolic parameters are consistent with Grade II diastolic dysfunction (pseudonormalization). Right Ventricle: The right ventricular size is normal. No increase in right ventricular wall thickness. Right ventricular systolic function is normal. Left Atrium: Left atrial size was normal in size. Right Atrium: Right atrial size was normal in size. Pericardium: There is no evidence of pericardial effusion. Mitral Valve: The mitral valve is grossly normal. Trivial mitral valve regurgitation. Tricuspid Valve: The tricuspid valve is normal in structure. Tricuspid valve regurgitation is not demonstrated. Aortic Valve: The aortic valve is tricuspid. There is mild calcification of the aortic valve. There is mild thickening of the aortic valve. Aortic valve regurgitation is not visualized. Aortic valve sclerosis/calcification is present, without any evidence of aortic stenosis. Pulmonic Valve: The pulmonic valve was normal in structure. Pulmonic valve regurgitation is trivial. Aorta: The aortic root is normal in size and structure. Venous: The inferior vena cava is normal in size with greater than 50% respiratory variability, suggesting right atrial pressure of 3 mmHg. IAS/Shunts: The atrial septum is grossly normal.  LEFT VENTRICLE PLAX 2D LVIDd:          3.30 cm   Diastology LVIDs:         2.10 cm   LV e' medial:    5.77 cm/s LV PW:         0.90 cm   LV E/e' medial:  12.7 LV IVS:        1.00 cm   LV e' lateral:   6.42 cm/s LVOT diam:  1.80 cm   LV E/e' lateral: 11.4 LV SV:         47 LV SV Index:   22 LVOT Area:     2.54 cm  RIGHT VENTRICLE RV S prime:     10.30 cm/s TAPSE (M-mode): 1.4 cm LEFT ATRIUM             Index        RIGHT ATRIUM           Index LA diam:        2.90 cm 1.34 cm/m   RA Area:     10.50 cm LA Vol (A2C):   35.9 ml 16.61 ml/m  RA Volume:   19.10 ml  8.84 ml/m LA Vol (A4C):   50.9 ml 23.55 ml/m LA Biplane Vol: 43.0 ml 19.89 ml/m  AORTIC VALVE LVOT Vmax:   97.90 cm/s LVOT Vmean:  67.800 cm/s LVOT VTI:    0.183 m  AORTA Ao Root diam: 2.50 cm Ao Asc diam:  2.80 cm MITRAL VALVE MV Area (PHT): 3.31 cm    SHUNTS MV Decel Time: 229 msec    Systemic VTI:  0.18 m MV E velocity: 73.10 cm/s  Systemic Diam: 1.80 cm MV A velocity: 78.50 cm/s MV E/A ratio:  0.93 Gwyndolyn Kaufman MD Electronically signed by Gwyndolyn Kaufman MD Signature Date/Time: 06/24/2022/12:48:58 PM    Final    DG Chest Port 1 View  Result Date: 06/23/2022 CLINICAL DATA:  Paroxysmal atrial tachycardia EXAM: PORTABLE CHEST 1 VIEW COMPARISON:  12/13/2021 FINDINGS: The heart size and mediastinal contours are within normal limits. Both lungs are clear. The visualized skeletal structures are unremarkable. IMPRESSION: No active disease. Electronically Signed   By: Inez Catalina M.D.   On: 06/23/2022 23:43    Assessment & Plan    1.  PSVT, possible atrial tachycardia.  Patient is in sinus rhythm this morning, has had some brief bursts of tachycardia overnight but feels better and tolerating Lopressor 12.5 mg twice daily for now.  Echocardiogram shows LVEF 65 to 70%.  2.  Elevated high-sensitivity troponin I levels consistent with demand ischemia.  Coronary CTA in March of last year showed a calcium score of 1.1 with minimal coronary atherosclerosis.  She has been on statin  therapy at baseline.  3.  Essential hypertension, on losartan and HCTZ which can most likely be resumed depending on blood pressure trend.  Would need to continue potassium supplement in that case as well.  4.  Mixed hyperlipidemia, on Crestor 40 mg daily.  Anticipate discharge home today from a cardiac perspective.  Continue Lopressor at 12.5 mg twice daily.  She is to be set up for an outpatient cardiac monitor (either 14-day ZIO or 30-day event recorder) for more long-term surveillance of her PSVT on beta-blocker therapy.  We will schedule office follow-up for further discussion.  If rhythm control remains challenging, can consider EP consultation regarding antiarrhythmic therapy or potentially options for ablation.  Signed, Rozann Lesches, MD  06/25/2022, 9:11 AM

## 2022-06-25 NOTE — Progress Notes (Signed)
Mobility Specialist Progress Note:   06/25/22 1130  Mobility  Activity Ambulated with assistance in hallway  Level of Assistance Standby assist, set-up cues, supervision of patient - no hands on  Assistive Device Front wheel walker  Distance Ambulated (ft) 300 ft  Activity Response Tolerated well  Mobility Referral Yes  $Mobility charge 1 Mobility   During Mobility: HR 120bpm Post Mobility: HR 70bpm  Pt agreeable to mobility session. Required no physical assistance, only RW for steadying. HR 120bpm at peak during ambulation. Pt c/o minor dizziness throughout. HR quickly recovered to 70bpm at EOS. Pt left in bed with all needs met.   Nelta Numbers Mobility Specialist Please contact via SecureChat or  Rehab office at [phone]

## 2022-06-25 NOTE — Discharge Summary (Signed)
Physician Discharge Summary   Patient: Rhonda [name] MRN: [id number] DOB: Nov 17, 1950  Admit date:     06/23/2022  Discharge date: 06/25/22  Discharge Physician: Karmen Bongo   PCP: [name], FNP   Recommendations at discharge:   Cardiology is arranging for a 30-day outpatient cardiac monitor Follow up with cardiology as scheduled Follow up with PCP in 1-2 weeks Continue metoprolol 12.5 mg twice daily in addition to other blood pressure medications  Discharge Diagnoses: Principal Problem:   Paroxysmal atrial tachycardia Active Problems:   Atypical chest pain   Elevated troponin   Essential hypertension   Hyperlipidemia   Moderate persistent asthma   Hospital Course: Patient with h/o HTN, HLD, and asthma presenting with chest pain and found to be in PAT. Her beta blocker was discontinued in the last few months. Initial HR 110-120s, spontaneously converted. Mildly elevated troponin. Started on metoprolol. Echo with preserved EF, grade 2 diastolic dysfunction.  Assessment and Plan: No notes have been filed under this hospital service. Service: Hospitalist  Atrial tachycardia -h/o palpitations, last seen in 06/2021 -Persistent PAT at the time of admission, resolved to NSR overnight -Started on metoprolol, continue -Cardiology consulted -Echo with preserved EF, grade 2 diastolic dysfunction -Cardiology is arranging an outpatient cardiac monitor and will f/u outpatient   Chest pain with elevated troponin -Atypical in nature by description but with uptrending troponin -Pain resolved -Prior coronary CTA in 06/2021 -Thought to be related to demand ischemia -Cardiology is not planning to do further ischemic evaluation at this time   Moderate persistent asthma -has been on prednisone, which is likely contributing to symptoms; will stop, as symptoms have resolved -Continue Wix[place]a   HTN -Continue losartan -Started on metoprolol -Resume HCTZ (and KCl)    HLD -Continue rosuvastatin   Glaucoma -Continue Cosopt  Vomiting -Patient had emesis overnight, appears to have resolved after she had a large BM -No emesis since 0100 -Able to tolerate PO this AM without difficulty  Morbid obesity -Body mass index is 44.76 kg/m..  -Weight loss should be encouraged -Outpatient PCP/bariatric medicine/bariatric surgery f/u encouraged      Consultants: Cardiology Procedures performed: Echo  Disposition: Home Diet recommendation:  Cardiac diet DISCHARGE MEDICATION: Allergies as of 06/25/2022       Reactions   Aspirin-acetaminophen-caffeine Nausea And Vomiting   Codeine Diarrhea, Nausea And Vomiting   Stomach cramps   Oxycontin [oxycodone Hcl] Other (See Comments)   Hallucination        Medication List     STOP taking these medications    predniSONE 10 MG tablet Commonly known as: DELTASONE       TAKE these medications    acetaminophen 650 MG CR tablet Commonly known as: TYLENOL Take 650 mg by mouth at bedtime as needed for pain.   albuterol 108 (90 Base) MCG/ACT inhaler Commonly known as: VENTOLIN HFA Inhale 2 puffs into the lungs every 4 (four) hours as needed for wheezing or shortness of breath.   dorzolamide-timolol 2-0.5 % ophthalmic solution Commonly known as: COSOPT Place 1 drop into both eyes 2 (two) times daily.   esomeprazole 40 MG capsule Commonly known as: NEXIUM Take 1 capsule (40 mg total) by mouth daily at 12 noon. What changed:  when to take this reasons to take this   fluticasone 50 MCG/ACT nasal spray Commonly known as: FLONASE Use 1 spray(s) in each nostril once daily What changed: See the new instructions.   fluticasone-salmeterol 250-50 MCG/ACT Aepb Commonly known as: Wixela Inhub Inhale 1  puff into the lungs in the morning and at bedtime. What changed:  when to take this reasons to take this   HM LIDOCAINE PATCH EX Apply 1 patch topically daily as needed (Pain).    hydrochlorothiazide 25 MG tablet Commonly known as: HYDRODIURIL Take 1 tablet (25 mg total) by mouth daily.   ICY HOT ADVANCED RELIEF EX Apply 1 Application topically daily as needed (pain).   levocetirizine 5 MG tablet Commonly known as: XYZAL Take 1 tablet (5 mg total) by mouth 2 (two) times daily as needed for allergies (Can use an dose during flare ups.).   losartan 100 MG tablet Commonly known as: COZAAR Take 1 tablet (100 mg total) by mouth daily.   Magnesium 250 MG Tabs Take 1 tablet (250 mg total) by mouth daily.   metoprolol tartrate 25 MG tablet Commonly known as: LOPRESSOR Take 0.5 tablets (12.5 mg total) by mouth 2 (two) times daily.   multivitamin with minerals Tabs tablet Take 1 tablet by mouth daily.   polyethylene glycol 17 g packet Commonly known as: MIRALAX / GLYCOLAX Take 17 g by mouth daily as needed for mild constipation.   potassium chloride 10 MEQ tablet Commonly known as: KLOR-CON Take 1 tablet by mouth once daily   rosuvastatin 40 MG tablet Commonly known as: CRESTOR Take 1 tablet (40 mg total) by mouth daily.   senna-docusate 8.6-50 MG tablet Commonly known as: Senokot-S Take 2 tablets by mouth 2 (two) times daily.   VITAMIN D3 PO Take 1 tablet by mouth daily.        Follow-up Information     Elgie Collard, PA-C Follow up.   Specialty: Cardiology Why: Hospital follow-up with Cardiology scheduled for 08/08/2022 at 9:15am. Please arrive 15 minutes early for check-in. If this date/time does not work for you, please call our office to reschedule. Our office will also call you to help arrange the outpatient heart monitor. Contact information: [address] [place] [address] [phone]                Subjective:    Patient reports feeling better.  She did have overnight emesis (as did daughter, ?VGE), none since 0100 and able to tolerate regular breakfast tray.  HR has been well controlled at rest, did increase  to 120s with exertion but returned to baseline quickly.  She requests to go home.   Discharge Exam: Filed Weights   06/23/22 1712 06/25/22 0526  Weight: 117.9 kg 114.6 kg   General:  Appears calm and comfortable and is in NAD Eyes:   EOMI, normal lids, iris ENT:  grossly normal hearing, lips & tongue, mmm Neck:  no LAD, masses or thyromegaly Cardiovascular:  RRR, no m/r/g. No LE edema.  Respiratory:   CTA bilaterally with no wheezes/rales/rhonchi.  Normal respiratory effort. Abdomen:  soft, NT, ND; ventral hernia Skin:  no rash or induration seen on limited exam Musculoskeletal:  grossly normal tone BUE/BLE, good ROM, no bony abnormality Psychiatric:  grossly normal mood and affect, speech fluent and appropriate, AOx3 Neurologic:  CN 2-12 grossly intact, moves all extremities in coordinated fashion  EKG: Independently reviewed.  NSR with rate 60 with first degree AV block; no evidence of acute ischemia   Labs on Admission: I have personally reviewed the available labs and imaging studies at the time of the admission.  Pertinent labs:    Na++ 134 Glucose 149   Condition at discharge: improving   Imaging Studies: ECHOCARDIOGRAM COMPLETE  Result Date: 06/24/2022    ECHOCARDIOGRAM REPORT   Patient Name:   SHADAISHA ERDELY Date of Exam: 06/24/2022 Medical Rec #:  [id number]          Height:       63.0 in Accession #:    [id number]         Weight:       260.0 lb Date of Birth:  1950-11-28          BSA:          2.162 m Patient Age:    19 years           BP:           126/69 mmHg Patient Gender: F                  HR:           61 bpm. Exam Location:  Inpatient Procedure: 2D Echo, Color Doppler and Cardiac Doppler Indications:    R07.9* Chest pain, unspecified  History:        Patient has prior history of Echocardiogram examinations, most                 recent 07/06/2021. Pulmonary HTN; Risk Factors:Hypertension and                 Dyslipidemia.  Sonographer:    Raquel Sarna Senior RDCS  Referring Phys: [id number] Rhetta Mura  Sonographer Comments: Tehnically difficult due to patient body habitu, apicals performed over the breast IMPRESSIONS  1. Left ventricular ejection fraction, by estimation, is 65 to 70%. The left ventricle has normal function. The left ventricle has no regional wall motion abnormalities. Left ventricular diastolic parameters are consistent with Grade II diastolic dysfunction (pseudonormalization).  2. Right ventricular systolic function is normal. The right ventricular size is normal.  3. The mitral valve is grossly normal. Trivial mitral valve regurgitation.  4. The aortic valve is tricuspid. There is mild calcification of the aortic valve. There is mild thickening of the aortic valve. Aortic valve regurgitation is not visualized. Aortic valve sclerosis/calcification is present, without any evidence of aortic stenosis.  5. The inferior vena cava is normal in size with greater than 50% respiratory variability, suggesting right atrial pressure of 3 mmHg. Comparison(s): No significant change from prior study. FINDINGS  Left Ventricle: Left ventricular ejection fraction, by estimation, is 65 to 70%. The left ventricle has normal function. The left ventricle has no regional wall motion abnormalities. The left ventricular internal cavity size was normal in size. There is  no left ventricular hypertrophy. Left ventricular diastolic parameters are consistent with Grade II diastolic dysfunction (pseudonormalization). Right Ventricle: The right ventricular size is normal. No increase in right ventricular wall thickness. Right ventricular systolic function is normal. Left Atrium: Left atrial size was normal in size. Right Atrium: Right atrial size was normal in size. Pericardium: There is no evidence of pericardial effusion. Mitral Valve: The mitral valve is grossly normal. Trivial mitral valve regurgitation. Tricuspid Valve: The tricuspid valve is normal in structure. Tricuspid valve  regurgitation is not demonstrated. Aortic Valve: The aortic valve is tricuspid. There is mild calcification of the aortic valve. There is mild thickening of the aortic valve. Aortic valve regurgitation is not visualized. Aortic valve sclerosis/calcification is present, without any evidence of aortic stenosis. Pulmonic Valve: The pulmonic valve was normal in structure. Pulmonic valve regurgitation is trivial. Aorta: The aortic root is normal in size and structure. Venous: The inferior vena  cava is normal in size with greater than 50% respiratory variability, suggesting right atrial pressure of 3 mmHg. IAS/Shunts: The atrial septum is grossly normal.  LEFT VENTRICLE PLAX 2D LVIDd:         3.30 cm   Diastology LVIDs:         2.10 cm   LV e' medial:    5.77 cm/s LV PW:         0.90 cm   LV E/e' medial:  12.7 LV IVS:        1.00 cm   LV e' lateral:   6.42 cm/s LVOT diam:     1.80 cm   LV E/e' lateral: 11.4 LV SV:         47 LV SV Index:   22 LVOT Area:     2.54 cm  RIGHT VENTRICLE RV S prime:     10.30 cm/s TAPSE (M-mode): 1.4 cm LEFT ATRIUM             Index        RIGHT ATRIUM           Index LA diam:        2.90 cm 1.34 cm/m   RA Area:     10.50 cm LA Vol (A2C):   35.9 ml 16.61 ml/m  RA Volume:   19.10 ml  8.84 ml/m LA Vol (A4C):   50.9 ml 23.55 ml/m LA Biplane Vol: 43.0 ml 19.89 ml/m  AORTIC VALVE LVOT Vmax:   97.90 cm/s LVOT Vmean:  67.800 cm/s LVOT VTI:    0.183 m  AORTA Ao Root diam: 2.50 cm Ao Asc diam:  2.80 cm MITRAL VALVE MV Area (PHT): 3.31 cm    SHUNTS MV Decel Time: 229 msec    Systemic VTI:  0.18 m MV E velocity: 73.10 cm/s  Systemic Diam: 1.80 cm MV A velocity: 78.50 cm/s MV E/A ratio:  0.93 Gwyndolyn Kaufman MD Electronically signed by Gwyndolyn Kaufman MD Signature Date/Time: 06/24/2022/12:48:58 PM    Final    DG Chest Port 1 View  Result Date: 06/23/2022 CLINICAL DATA:  Paroxysmal atrial tachycardia EXAM: PORTABLE CHEST 1 VIEW COMPARISON:  12/13/2021 FINDINGS: The heart size and mediastinal  contours are within normal limits. Both lungs are clear. The visualized skeletal structures are unremarkable. IMPRESSION: No active disease. Electronically Signed   By: Inez Catalina M.D.   On: 06/23/2022 23:43      Discharge time spent: greater than 30 minutes.  Signed: Karmen Bongo, MD [facility] 06/25/2022

## 2022-06-27 NOTE — Progress Notes (Signed)
Rescheduled 07/01/22  [place]  Direct Dial: [phone]

## 2022-06-29 ENCOUNTER — Telehealth: Payer: Self-pay

## 2022-06-29 NOTE — Transitions of Care (Post Inpatient/ED Visit) (Signed)
   06/29/2022  Name: Rhonda [name] MRN: [id number] DOB: 1951-01-11  Today's TOC FU Call Status: Today's TOC FU Call Status:: Successful TOC FU Call Competed TOC FU Call Complete Date: 06/29/22  Transition Care Management Follow-up Telephone Call Date of Discharge: 06/25/22 Dis[facility]) Type of Discharge: Emergency Department Primary Inpatient Discharge Diagnosis:: Paroxysmal atrial tachycardia Reason for ED Visit: Cardiac Conditions How have you been since you were released from the hospital?: Same Any questions or concerns?: No  Items Reviewed: Did you receive and understand the discharge instructions provided?: Yes Medications obtained and verified?: Yes (Medications Reviewed) Any new allergies since your discharge?: No Dietary orders reviewed?: No Do you have support at home?: Yes People in Home: child(ren), adult  Home Care and Equipment/Supplies: [place] Ordered?: No Any new equipment or medical supplies ordered?: No  Functional Questionnaire: Do you need assistance with bathing/showering or dressing?: No Do you need assistance with meal preparation?: No Do you need assistance with eating?: No Do you have difficulty maintaining continence: No Do you need assistance with getting out of bed/getting out of a chair/moving?: No Do you have difficulty managing or taking your medications?: No  Folllow up appointments reviewed: PCP Follow-up appointment confirmed?: No MD Provider Line Number:[phone] Given: Yes [facility] Follow-up appointment confirmed?: No Reason Specialist Follow-Up Not Confirmed: Patient has Specialist Provider Number and will Call for Appointment Do you need transportation to your follow-up appointment?: No Do you understand care options if your condition(s) worsen?: Yes-patient verbalized understanding    SIGNATURE Tami Lin.

## 2022-06-30 ENCOUNTER — Other Ambulatory Visit: Payer: Self-pay

## 2022-06-30 ENCOUNTER — Telehealth: Payer: Self-pay | Admitting: Internal Medicine

## 2022-06-30 ENCOUNTER — Emergency Department (HOSPITAL_COMMUNITY)
Admission: EM | Admit: 2022-06-30 | Discharge: 2022-06-30 | Disposition: A | Discharge status: Home / Self Care | Payer: Medicare Other | Attending: Emergency Medicine | Admitting: Emergency Medicine

## 2022-06-30 ENCOUNTER — Encounter (HOSPITAL_COMMUNITY): Payer: Self-pay

## 2022-06-30 ENCOUNTER — Emergency Department (HOSPITAL_COMMUNITY): Payer: Medicare Other

## 2022-06-30 DIAGNOSIS — J45909 Unspecified asthma, uncomplicated: Secondary | ICD-10-CM | POA: Diagnosis not present

## 2022-06-30 DIAGNOSIS — I10 Essential (primary) hypertension: Secondary | ICD-10-CM | POA: Diagnosis not present

## 2022-06-30 DIAGNOSIS — R0789 Other chest pain: Secondary | ICD-10-CM | POA: Insufficient documentation

## 2022-06-30 DIAGNOSIS — Z79899 Other long term (current) drug therapy: Secondary | ICD-10-CM | POA: Diagnosis not present

## 2022-06-30 DIAGNOSIS — Z7951 Long term (current) use of inhaled steroids: Secondary | ICD-10-CM | POA: Insufficient documentation

## 2022-06-30 DIAGNOSIS — R079 Chest pain, unspecified: Secondary | ICD-10-CM

## 2022-06-30 DIAGNOSIS — I2510 Atherosclerotic heart disease of native coronary artery without angina pectoris: Secondary | ICD-10-CM | POA: Diagnosis not present

## 2022-06-30 LAB — CBC
HCT: 36.6 % (ref 36.0–46.0)
Hemoglobin: 12 g/dL (ref 12.0–15.0)
MCH: 28.8 pg (ref 26.0–34.0)
MCHC: 32.8 g/dL (ref 30.0–36.0)
MCV: 87.8 fL (ref 80.0–100.0)
Platelets: 221 10*3/uL (ref 150–400)
RBC: 4.17 MIL/uL (ref 3.87–5.11)
RDW: 14.6 % (ref 11.5–15.5)
WBC: 7 10*3/uL (ref 4.0–10.5)
nRBC: 0 % (ref 0.0–0.2)

## 2022-06-30 LAB — TROPONIN I (HIGH SENSITIVITY)
Troponin I (High Sensitivity): 16 ng/L (ref <18)
Troponin I (High Sensitivity): 15 ng/L (ref <18)

## 2022-06-30 LAB — BASIC METABOLIC PANEL
Anion gap: 6 (ref 5–15)
BUN: 18 mg/dL (ref 8–23)
CO2: 31 mmol/L (ref 22–32)
Calcium: 9.3 mg/dL (ref 8.9–10.3)
Chloride: 99 mmol/L (ref 98–111)
Creatinine, Ser: 0.95 mg/dL (ref 0.44–1.00)
GFR, Estimated: >60 mL/min (ref >60)
Glucose, Bld: 123 mg/dL — ABNORMAL HIGH (ref 70–99)
Potassium: 3.4 mmol/L — ABNORMAL LOW (ref 3.5–5.1)
Sodium: 136 mmol/L (ref 135–145)

## 2022-06-30 MED ORDER — METOPROLOL TARTRATE 25 MG PO TABS: 25.0000 mg | ORAL_TABLET | Freq: Two times a day (BID) | ORAL | 1 refills | Status: DC

## 2022-06-30 NOTE — Telephone Encounter (Signed)
Pt c/o of Chest Pain: STAT if CP now or developed within 24 hours  1. Are you having CP right now? Yes   2. Are you experiencing any other symptoms (ex. SOB, nausea, vomiting, sweating)? No   3. How long have you been experiencing CP? Started back again this morning.   4. Is your CP continuous or coming and going? Continuous   5. Have you taken Nitroglycerin? No  ?

## 2022-06-30 NOTE — ED Provider Notes (Signed)
[facility] Provider Note   CSN: [id number] Arrival date & time: 06/30/22  0920     History  Chief Complaint  Patient presents with   Chest Pain    Rhonda [name] is a 72 y.o. female.  HPI Patient presents 5 days after discharge now with concern for recurrence of severe chest pain.  She has a history of cardiac disease, was admitted 1 week ago following chest pain, diagnosed with NSTEMI.  She notes that since discharge her pain improved, but today recurred more severe, superior, left shoulder, not improved with an additional dose of metoprolol.  Seemingly no new weakness that she does have baseline neuropathy.  No new vomiting, no new fever, unclear if she has new dyspnea.    Home Medications Prior to Admission medications   Medication Sig Start Date End Date Taking? Authorizing Provider  acetaminophen (TYLENOL) 650 MG CR tablet Take 650 mg by mouth at bedtime as needed for pain.    [provider]  albuterol (VENTOLIN HFA) 108 (90 Base) MCG/ACT inhaler Inhale 2 puffs into the lungs every 4 (four) hours as needed for wheezing or shortness of breath. 08/03/21   Kozlow, Donnamarie Poag, MD  Cholecalciferol (VITAMIN D3 PO) Take 1 tablet by mouth daily.    [provider]  dorzolamide-timolol (COSOPT) 22.3-6.8 MG/ML ophthalmic solution Place 1 drop into both eyes 2 (two) times daily.     [provider]  esomeprazole (NEXIUM) 40 MG capsule Take 1 capsule (40 mg total) by mouth daily at 12 noon. Patient taking differently: Take 40 mg by mouth daily as needed (acid reflux). 12/05/19   Nolberto Hanlon, MD  fluticasone (FLONASE) 50 MCG/ACT nasal spray Use 1 spray(s) in each nostril once daily Patient taking differently: Place 1 spray into both nostrils daily as needed for allergies or rhinitis. 05/09/22   Glendale Chard, MD  fluticasone-salmeterol [facility] INHUB) 250-50 MCG/ACT AEPB Inhale 1 puff into the lungs in the morning and at  bedtime. Patient taking differently: Inhale 1 puff into the lungs daily as needed (Asthma). 08/03/21   Kozlow, Donnamarie Poag, MD  HM LIDOCAINE PATCH EX Apply 1 patch topically daily as needed (Pain).    [provider]  hydrochlorothiazide (HYDRODIURIL) 25 MG tablet Take 1 tablet (25 mg total) by mouth daily. 03/21/22   [name], FNP  levocetirizine (XYZAL) 5 MG tablet Take 1 tablet (5 mg total) by mouth 2 (two) times daily as needed for allergies (Can use an dose during flare ups.). 08/03/21   Kozlow, Donnamarie Poag, MD  losartan (COZAAR) 100 MG tablet Take 1 tablet (100 mg total) by mouth daily. 05/27/22   [name], FNP  Magnesium 250 MG TABS Take 1 tablet (250 mg total) by mouth daily. 12/16/20   [name], FNP  Menthol, Topical Analgesic, (ICY HOT ADVANCED RELIEF EX) Apply 1 Application topically daily as needed (pain).    [provider]  metoprolol tartrate (LOPRESSOR) 25 MG tablet Take 0.5 tablets (12.5 mg total) by mouth 2 (two) times daily. 06/25/22   Karmen Bongo, MD  Multiple Vitamin (MULTIVITAMIN WITH MINERALS) TABS tablet Take 1 tablet by mouth daily.    [provider]  polyethylene glycol (MIRALAX / GLYCOLAX) 17 g packet Take 17 g by mouth daily as needed for mild constipation. 12/05/19   Nolberto Hanlon, MD  potassium chloride (KLOR-CON) 10 MEQ tablet Take 1 tablet by mouth once daily Patient taking differently: Take 10 mEq by mouth daily.  05/26/22   [name], FNP  rosuvastatin (CRESTOR) 40 MG tablet Take 1 tablet (40 mg total) by mouth daily. 12/03/21   Fay Records, MD  senna-docusate (SENOKOT-S) 8.6-50 MG tablet Take 2 tablets by mouth 2 (two) times daily. 06/25/22   Karmen Bongo, MD      Allergies    Aspirin-acetaminophen-caffeine, Codeine, and Oxycontin [oxycodone hcl]    Review of Systems   Review of Systems  All other systems reviewed and are negative.   Physical Exam Updated Vital Signs BP (!) 141/68   Pulse 70   Temp 98 F (36.7 C)   Resp  19   Ht '5\' 3"'$  (1.6 m)   Wt 114.6 kg   SpO2 100%   BMI 44.75 kg/m  Physical Exam Vitals and nursing note reviewed.  Constitutional:      General: She is not in acute distress.    Appearance: She is well-developed.  HENT:     Head: Normocephalic and atraumatic.  Eyes:     Conjunctiva/sclera: Conjunctivae normal.  Cardiovascular:     Rate and Rhythm: Normal rate and regular rhythm.  Pulmonary:     Effort: Pulmonary effort is normal. No respiratory distress.     Breath sounds: Normal breath sounds. No stridor.  Abdominal:     General: There is no distension.  Skin:    General: Skin is warm and dry.  Neurological:     Mental Status: She is alert and oriented to person, place, and time.     Cranial Nerves: No cranial nerve deficit.  Psychiatric:        Mood and Affect: Mood normal.     ED Results / Procedures / Treatments   Labs (all labs ordered are listed, but only abnormal results are displayed) Labs Reviewed  BASIC METABOLIC PANEL - Abnormal; Notable for the following components:      Result Value   Potassium 3.4 (*)    Glucose, Bld 123 (*)    All other components within normal limits  CBC  TROPONIN I (HIGH SENSITIVITY)  TROPONIN I (HIGH SENSITIVITY)    EKG EKG Interpretation  Date/Time:  Thursday June 30 2022 09:15:18 EST Ventricular Rate:  65 PR Interval:  204 QRS Duration: 112 QT Interval:  384 QTC Calculation: 399 R Axis:   3 Text Interpretation: Normal sinus rhythm Incomplete right bundle branch block ST-t wave abnormality similar to study from 06/25/22 Confirmed by Carmin Muskrat [phone]) on 06/30/2022 9:39:49 AM  Radiology DG Chest 2 View  Result Date: 06/30/2022 CLINICAL DATA:  cp EXAM: CHEST - 2 VIEW COMPARISON:  06/23/2022. FINDINGS: The heart size and mediastinal contours are within normal limits. Both lungs are clear. No pneumothorax or pleural effusion. Aorta is calcified. There are thoracic degenerative changes. IMPRESSION: No active  cardiopulmonary disease. Electronically Signed   By: Sammie Bench M.D.   On: 06/30/2022 10:29    Procedures Procedures    Medications Ordered in ED Medications - No data to display  ED Course/ Medical Decision Making/ A&P                             Medical Decision Making Amount and/or Complexity of Data Reviewed Labs: ordered. Radiology: ordered.   2:42 PM Patient with diminished pain.  I reviewed her labs, discussed them with her, and her daughter who is now at bedside.  Both troponin values are normal, both diminished since most recent hospitalization.  With decreased pain,  nonischemic EKG, low suspicion for atypical ACS.  Patient also has no evidence for bacteremia, sepsis.  I discussed case with our cardiology colleagues and they will consult on the patient's presentation.            Final Clinical Impression(s) / ED Diagnoses Final diagnoses:  Atypical chest pain     Carmin Muskrat, MD 06/30/22 1600

## 2022-06-30 NOTE — Discharge Instructions (Addendum)
As discussed, your evaluation today has been largely reassuring.  But, it is important that you monitor your condition carefully, and do not hesitate to return to the ED if you develop new, or concerning changes in your condition.  Your medications have been changed by our cardiology team.  Arrangements had been made for follow-up, and they will contact you with exact timing in the coming days.
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
## 2022-06-30 NOTE — ED Provider Triage Note (Signed)
Emergency Medicine Provider Triage Evaluation Note  Rhonda [name] , a 72 y.o. female  was evaluated in triage.  Pt complains of chest pain. States that same began when she woke up this morning and has persisted since. Denies any hx similar pain previously.  Pain is left-sided in nature and radiates into her left arm.  She does endorse some associated shortness of breath.  Does note that she was admitted last week due to uptrending troponins, was found to have atrial tachycardia and initiated metoprolol which she has been taking.  She states the pain she feels today is different.  Denies fevers, chills, nausea, or vomiting.  Review of Systems  Positive:  Negative:   Physical Exam  BP 125/77 (BP Location: Right Arm)   Pulse 66   Temp 98.5 F (36.9 C)   Resp 16   Ht '5\' 3"'$  (1.6 m)   Wt 114.6 kg   SpO2 100%   BMI 44.75 kg/m  Gen:   Awake, no distress   Resp:  Normal effort  MSK:   Moves extremities without difficulty  Other:    Medical Decision Making  Medically screening exam initiated at 9:52 AM.  Appropriate orders placed.  Rhonda [name] was informed that the remainder of the evaluation will be completed by another provider, this initial triage assessment does not replace that evaluation, and the importance of remaining in the ED until their evaluation is complete.     Bud Face, PA-C 06/30/22 [phone]

## 2022-06-30 NOTE — ED Notes (Signed)
Denies questions or needs, ready to go, out in w/c, family present.

## 2022-06-30 NOTE — ED Triage Notes (Signed)
Pt c/o left sided chest pain that radiates to left shoulder and armx1wk. Pt c/o SOB. Pt denies N/V. Pt is eupneic

## 2022-06-30 NOTE — ED Notes (Signed)
Delay in d/c. Dr. Harrington Challenger cardiology into room, at [facility]. Pt alert, NAD, calm, interactive, sitting on side of stretcher.

## 2022-06-30 NOTE — Telephone Encounter (Signed)
Patient was recently admitted to the hospital on 06/23/22 for active chest pain. Pt called stating she is currently having active chest pain for an hour and mild shortness of breath. Pt does not monitor her blood pressure. She did not call 911 wanted to know if she can stop by the clinic. Advised patient to call 911 or go to the nearest emergency room. Patient voiced understanding. Offered to call 911 for the patient, she declined. Pt stated her daughter will take her to the nearest emergency room. Will forward to MD and nurse.

## 2022-07-01 ENCOUNTER — Ambulatory Visit: Payer: Self-pay

## 2022-07-01 NOTE — Patient Outreach (Addendum)
  Care Coordination   Follow Up Visit Note   07/01/2022 Name: [name] MRN: [id number] DOB: 1951/02/16  Rhonda [name] is a 72 y.o. year old female who sees Rhonda [name], [place] for primary care. I spoke with  Rhonda [name] by phone today.  What matters to the patients health and wellness today?  Patient will wear a 30 day cardiac event monitor for further evaluation of Afib.     Goals Addressed             This Visit's Progress    To keep Atrial fib under control       Care Coordination Interventions: Afib action plan reviewed Determined patient experienced a new episode of Afib in mid February with reoccurring symptoms on 06/30/22 Review of patient status, including review of consultant's reports, relevant laboratory and other test results, and medications completed Discussed with patient she is awaiting to receive a cardiac event monitor as ordered by Dr. Harrington [name], she understands recommendations for 30 day monitoring  Reviewed scheduled/upcoming provider appointments including: next PCP follow up with Rhonda [name] scheduled for 08/03/22 '@11'$ :20 AM; next Cardiology follow up appointment scheduled for 08/09/22 '@10'$ :30 AM Instructed patient to call 911 if symptoms reoccur     Interventions Today    Flowsheet Row Most Recent Value  Chronic Disease   Chronic disease during today's visit Atrial Fibrillation (AFib)  General Interventions   General Interventions Discussed/Reviewed General Interventions Discussed, General Interventions Reviewed, Doctor Visits  Doctor Visits Discussed/Reviewed Doctor Visits Discussed, Doctor Visits Reviewed, Specialist  Education Interventions   Education Provided Provided Education  Provided Verbal Education On When to see the doctor, Medication  Pharmacy Interventions   Pharmacy Dicussed/Reviewed Medications and their functions        SDOH assessments and interventions completed:  No   Care Coordination Interventions:  Yes, provided    Follow up plan: Follow up call scheduled for 08/09/22 '@10'$ :30 AM    Encounter Outcome:  Pt. Visit Completed

## 2022-07-01 NOTE — Patient Instructions (Signed)
Visit Information  Thank you for taking time to visit with me today. Please don't hesitate to contact me if I can be of assistance to you.   Following are the goals we discussed today:   Goals Addressed             This Visit's Progress    To keep Atrial fib under control       Care Coordination Interventions: Afib action plan reviewed Determined patient experienced a new episode of Afib in mid February with reoccurring symptoms on 06/30/22 Review of patient status, including review of consultant's reports, relevant laboratory and other test results, and medications completed Discussed with patient she is awaiting to receive a cardiac event monitor as ordered by Dr. Harrington Challenger, she understands recommendations for 30 day monitoring  Reviewed scheduled/upcoming provider appointments including: next PCP follow up with [name] scheduled for 08/03/22 '@11'$ :20 AM; next Cardiology follow up appointment scheduled for 08/09/22 '@10'$ :30 AM Instructed patient to call 911 if symptoms reoccur            Our next appointment is by telephone on 08/09/22 at 10:30 AM  Please call the care guide team at [phone] if you need to cancel or reschedule your appointment.   If you are experiencing a Mental Health or [place] or need someone to talk to, please call 1-800-273-TALK (toll free, 24 hour hotline) go to [facility] [address] [place] [phone])  Patient verbalizes understanding of instructions and care plan provided today and agrees to view in [place]. Active MyChart status and patient understanding of how to access instructions and care plan via MyChart confirmed with patient.     Barb Merino, RN, BSN, CCM Care Management Coordinator [facility] Care Management Direct Phone: [phone]

## 2022-07-04 ENCOUNTER — Other Ambulatory Visit: Payer: Self-pay | Admitting: Nurse Practitioner

## 2022-07-04 DIAGNOSIS — M1612 Unilateral primary osteoarthritis, left hip: Secondary | ICD-10-CM | POA: Diagnosis not present

## 2022-07-04 DIAGNOSIS — M5432 Sciatica, left side: Secondary | ICD-10-CM | POA: Diagnosis not present

## 2022-07-05 ENCOUNTER — Ambulatory Visit: Payer: Medicare Other | Attending: Student

## 2022-07-05 DIAGNOSIS — I4719 Other supraventricular tachycardia: Secondary | ICD-10-CM | POA: Diagnosis not present

## 2022-07-05 DIAGNOSIS — I480 Paroxysmal atrial fibrillation: Secondary | ICD-10-CM | POA: Diagnosis not present

## 2022-07-07 DIAGNOSIS — M1612 Unilateral primary osteoarthritis, left hip: Secondary | ICD-10-CM | POA: Diagnosis not present

## 2022-07-12 DIAGNOSIS — M5432 Sciatica, left side: Secondary | ICD-10-CM | POA: Diagnosis not present

## 2022-07-12 DIAGNOSIS — M25552 Pain in left hip: Secondary | ICD-10-CM | POA: Diagnosis not present

## 2022-07-12 DIAGNOSIS — M545 Low back pain, unspecified: Secondary | ICD-10-CM | POA: Diagnosis not present

## 2022-07-14 DIAGNOSIS — M25552 Pain in left hip: Secondary | ICD-10-CM | POA: Diagnosis not present

## 2022-07-14 DIAGNOSIS — M5432 Sciatica, left side: Secondary | ICD-10-CM | POA: Diagnosis not present

## 2022-07-14 DIAGNOSIS — M545 Low back pain, unspecified: Secondary | ICD-10-CM | POA: Diagnosis not present

## 2022-07-20 DIAGNOSIS — M5432 Sciatica, left side: Secondary | ICD-10-CM | POA: Diagnosis not present

## 2022-07-20 DIAGNOSIS — M25552 Pain in left hip: Secondary | ICD-10-CM | POA: Diagnosis not present

## 2022-07-20 DIAGNOSIS — M545 Low back pain, unspecified: Secondary | ICD-10-CM | POA: Diagnosis not present

## 2022-07-28 DIAGNOSIS — M25552 Pain in left hip: Secondary | ICD-10-CM | POA: Diagnosis not present

## 2022-07-28 DIAGNOSIS — M5432 Sciatica, left side: Secondary | ICD-10-CM | POA: Diagnosis not present

## 2022-07-28 DIAGNOSIS — M545 Low back pain, unspecified: Secondary | ICD-10-CM | POA: Diagnosis not present

## 2022-08-02 DIAGNOSIS — Z860100 Personal history of colon polyps, unspecified: Secondary | ICD-10-CM | POA: Insufficient documentation

## 2022-08-02 DIAGNOSIS — M545 Low back pain, unspecified: Secondary | ICD-10-CM | POA: Diagnosis not present

## 2022-08-02 DIAGNOSIS — M5432 Sciatica, left side: Secondary | ICD-10-CM | POA: Diagnosis not present

## 2022-08-02 DIAGNOSIS — M25552 Pain in left hip: Secondary | ICD-10-CM | POA: Diagnosis not present

## 2022-08-02 DIAGNOSIS — K5900 Constipation, unspecified: Secondary | ICD-10-CM | POA: Insufficient documentation

## 2022-08-02 DIAGNOSIS — R197 Diarrhea, unspecified: Secondary | ICD-10-CM | POA: Insufficient documentation

## 2022-08-02 DIAGNOSIS — D509 Iron deficiency anemia, unspecified: Secondary | ICD-10-CM | POA: Insufficient documentation

## 2022-08-03 ENCOUNTER — Encounter: Payer: Self-pay | Admitting: Nurse Practitioner

## 2022-08-03 ENCOUNTER — Ambulatory Visit (INDEPENDENT_AMBULATORY_CARE_PROVIDER_SITE_OTHER): Payer: Medicare Other | Admitting: Nurse Practitioner

## 2022-08-03 VITALS — BP 142/72 | HR 71 | Temp 98.2°F | Ht 63.0 in | Wt 256.0 lb

## 2022-08-03 DIAGNOSIS — I700 Atherosclerosis of aorta: Secondary | ICD-10-CM | POA: Diagnosis not present

## 2022-08-03 DIAGNOSIS — I10 Essential (primary) hypertension: Secondary | ICD-10-CM | POA: Diagnosis not present

## 2022-08-03 DIAGNOSIS — R7303 Prediabetes: Secondary | ICD-10-CM | POA: Diagnosis not present

## 2022-08-03 DIAGNOSIS — Z6841 Body Mass Index (BMI) 40.0 and over, adult: Secondary | ICD-10-CM

## 2022-08-03 NOTE — Progress Notes (Signed)
I,Sheena H Holbrook,acting as a Education administrator for [name], FNP.,have documented all relevant documentation on the behalf of [name], FNP,as directed by  [name], FNP while in the presence of [name], [place].    Subjective:     Patient ID: Rhonda [name] , female    DOB: 1950-09-03 , 72 y.o.   MRN: [id number]   Chief Complaint  Patient presents with   Hypertension    HPI  Patient presents today for hypertension follow up. Patient also is having issues with sciatica on her left side, she has been seeing [facility] for this. She does feel the symptoms are better. She has a few more days of PT.  Patient is currently wearing a heart monitor, which will come off this Friday. She does need the adhesive changed and is asking for assistance to change the monitor here today. She has not had to note an occurrence with her heart as much.    Hypertension This is a chronic problem. The current episode started more than 1 year ago. The problem is unchanged. The problem is controlled. Pertinent negatives include no anxiety or blurred vision. There are no associated agents to hypertension. Risk factors for coronary artery disease include obesity and sedentary lifestyle. Compliance problems include exercise.  There is no history of chronic renal disease.     Past Medical History:  Diagnosis Date   Anemia    hx none since menopause   Arthritis    "maybe in my legs" (06/15/2016)   Asthma 2018   none since   Complication of anesthesia    woke up during colonoscopy 03-31-16 Dr. Thornton [place]   GERD (gastroesophageal reflux disease)    History of hiatal hernia    Hyperlipemia    Hypertension    Hypokalemia due to excessive gastrointestinal loss of potassium 12/02/2019   Pre-diabetes 2019   Thyroid nodule 2010   "no problems w/them since; on a pill for a while" (06/15/2016)     Family History  Problem Relation Age of Onset   COPD Father    Breast cancer Neg Hx      Current Outpatient  Medications:    acetaminophen (TYLENOL) 650 MG CR tablet, Take 650 mg by mouth at bedtime as needed for pain., Disp: , Rfl:    albuterol (VENTOLIN HFA) 108 (90 Base) MCG/ACT inhaler, Inhale 2 puffs into the lungs every 4 (four) hours as needed for wheezing or shortness of breath., Disp: 18 g, Rfl: 1   Cholecalciferol (VITAMIN D3 PO), Take 1 tablet by mouth daily., Disp: , Rfl:    dorzolamide-timolol (COSOPT) 22.3-6.8 MG/ML ophthalmic solution, Place 1 drop into both eyes 2 (two) times daily. , Disp: , Rfl:    esomeprazole (NEXIUM) 40 MG capsule, Take 1 capsule (40 mg total) by mouth daily at 12 noon. (Patient taking differently: Take 40 mg by mouth daily as needed (acid reflux).), Disp: 30 capsule, Rfl: 0   fluticasone (FLONASE) 50 MCG/ACT nasal spray, Use 1 spray(s) in each nostril once daily (Patient taking differently: Place 1 spray into both nostrils daily as needed for allergies or rhinitis.), Disp: 16 g, Rfl: 0   fluticasone-salmeterol (WIXELA INHUB) 250-50 MCG/ACT AEPB, Inhale 1 puff into the lungs in the morning and at bedtime. (Patient taking differently: Inhale 1 puff into the lungs daily as needed (Asthma).), Disp: 60 each, Rfl: 5   HM LIDOCAINE PATCH EX, Apply 1 patch topically daily as needed (Pain)., Disp: , Rfl:    hydrochlorothiazide (HYDRODIURIL) 25 MG  tablet, Take 1 tablet (25 mg total) by mouth daily., Disp: 90 tablet, Rfl: 1   levocetirizine (XYZAL) 5 MG tablet, Take 1 tablet (5 mg total) by mouth 2 (two) times daily as needed for allergies (Can use an dose during flare ups.)., Disp: 60 tablet, Rfl: 5   losartan (COZAAR) 100 MG tablet, Take 1 tablet (100 mg total) by mouth daily., Disp: 90 tablet, Rfl: 1   Magnesium 250 MG TABS, Take 1 tablet (250 mg total) by mouth daily., Disp: 30 tablet, Rfl: 0   Menthol, Topical Analgesic, (ICY HOT ADVANCED RELIEF EX), Apply 1 Application topically daily as needed (pain)., Disp: , Rfl:    metoprolol tartrate (LOPRESSOR) 25 MG tablet, Take 1  tablet (25 mg total) by mouth 2 (two) times daily., Disp: 60 tablet, Rfl: 1   Multiple Vitamin (MULTIVITAMIN WITH MINERALS) TABS tablet, Take 1 tablet by mouth daily., Disp: , Rfl:    polyethylene glycol (MIRALAX / GLYCOLAX) 17 g packet, Take 17 g by mouth daily as needed for mild constipation., Disp: 14 each, Rfl: 0   potassium chloride (KLOR-CON) 10 MEQ tablet, Take 1 tablet (10 mEq total) by mouth daily., Disp: 90 tablet, Rfl: 2   rosuvastatin (CRESTOR) 40 MG tablet, Take 1 tablet (40 mg total) by mouth daily., Disp: 90 tablet, Rfl: 3   senna-docusate (SENOKOT-S) 8.6-50 MG tablet, Take 2 tablets by mouth 2 (two) times daily., Disp: 60 tablet, Rfl: 1   Allergies  Allergen Reactions   Aspirin-Acetaminophen-Caffeine Nausea And Vomiting   Codeine Diarrhea and Nausea And Vomiting    Stomach cramps    Oxycontin [Oxycodone Hcl] Other (See Comments)    Hallucination      Review of Systems  Constitutional: Negative.   Eyes:  Negative for blurred vision.  Respiratory: Negative.    Cardiovascular: Negative.   Gastrointestinal: Negative.   Musculoskeletal:  Positive for back pain.  Neurological: Negative.   Psychiatric/Behavioral: Negative.    All other systems reviewed and are negative.    Today's Vitals   08/03/22 1119  BP: (!) 142/72  Pulse: 71  Temp: 98.2 F (36.8 C)  TempSrc: Oral  SpO2: 99%  Weight: 256 lb (116.1 kg)  Height: 5\' 3"  (1.6 m)   Body mass index is 45.35 kg/m.   Objective:  Physical Exam Vitals reviewed.  Constitutional:      Appearance: Normal appearance.  Cardiovascular:     Rate and Rhythm: Normal rate and regular rhythm.     Pulses: Normal pulses.     Heart sounds: Normal heart sounds. No murmur heard. Pulmonary:     Effort: Pulmonary effort is normal. No respiratory distress.     Breath sounds: Normal breath sounds. No wheezing.  Neurological:     General: No focal deficit present.     Mental Status: She is alert and oriented to person, place,  and time.     Cranial Nerves: No cranial nerve deficit.     Motor: No weakness.         Assessment And Plan:     1. Essential hypertension Comments: Blood pressure is slightly elevated, encouraged to focus on lifestyle modifications  2. Prediabetes Comments: Stable, continue focusing on diet low in sugar and starches. - Hemoglobin A1c  3. Atherosclerosis of aorta Comments: Continue statin, tolerating well. - Lipid panel  4. Class 3 severe obesity due to excess calories without serious comorbidity with body mass index (BMI) of 45.0 to 49.9 in adult She is encouraged to strive for  BMI less than 30 to decrease cardiac risk. Advised to aim for at least 150 minutes of exercise per week as tolerated   Patient was given opportunity to ask questions. Patient verbalized understanding of the plan and was able to repeat key elements of the plan. All questions were answered to their satisfaction.  [name], FNP    I, [name], FNP, have reviewed all documentation for this visit. The documentation on 08/03/22 for the exam, diagnosis, procedures, and orders are all accurate and complete.  IF YOU HAVE BEEN REFERRED TO A SPECIALIST, IT MAY TAKE 1-2 WEEKS TO SCHEDULE/PROCESS THE REFERRAL. IF YOU HAVE NOT HEARD FROM US/SPECIALIST IN TWO WEEKS, PLEASE GIVE [place] A CALL AT [phone].   THE PATIENT IS ENCOURAGED TO PRACTICE SOCIAL DISTANCING DUE TO THE COVID-19 PANDEMIC.

## 2022-08-04 DIAGNOSIS — M1612 Unilateral primary osteoarthritis, left hip: Secondary | ICD-10-CM | POA: Diagnosis not present

## 2022-08-04 DIAGNOSIS — M5432 Sciatica, left side: Secondary | ICD-10-CM | POA: Diagnosis not present

## 2022-08-04 DIAGNOSIS — M545 Low back pain, unspecified: Secondary | ICD-10-CM | POA: Diagnosis not present

## 2022-08-04 DIAGNOSIS — M25552 Pain in left hip: Secondary | ICD-10-CM | POA: Diagnosis not present

## 2022-08-04 LAB — HEMOGLOBIN A1C
Est. average glucose Bld gHb Est-mCnc: 140 mg/dL
Hgb A1c MFr Bld: 6.5 % — ABNORMAL HIGH (ref 4.8–5.6)

## 2022-08-04 LAB — LIPID PANEL
Chol/HDL Ratio: 3.1 ratio (ref 0.0–4.4)
Cholesterol, Total: 201 mg/dL — ABNORMAL HIGH (ref 100–199)
HDL: 65 mg/dL (ref >39)
LDL Chol Calc (NIH): 122 mg/dL — ABNORMAL HIGH (ref 0–99)
Triglycerides: 75 mg/dL (ref 0–149)
VLDL Cholesterol Cal: 14 mg/dL (ref 5–40)

## 2022-08-05 ENCOUNTER — Encounter: Payer: Self-pay | Admitting: Physician Assistant

## 2022-08-05 ENCOUNTER — Telehealth: Payer: Self-pay | Admitting: *Deleted

## 2022-08-05 NOTE — Progress Notes (Signed)
  Care Coordination Note  08/05/2022 Name: Rhonda [name] MRN: [id number] DOB: [birth year]  Rhonda [name] is a 72 y.o. year old female who is a primary care patient of [name], FNP and is actively engaged with the care management team. I reached out to [name] by phone today to assist with re-scheduling a follow up visit with the RN Case Manager  Follow up plan: Unsuccessful telephone outreach attempt made. A HIPAA compliant phone message was left for the patient providing contact information and requesting a return call.   [facility]  Care Coordination Care Guide  Direct Dial: [phone]

## 2022-08-05 NOTE — Progress Notes (Signed)
  Care Coordination Note  08/05/2022 Name: Rhonda [name] MRN: [id number] DOB: 27-Aug-1950  [name] is a 72 y.o. year old female who is a primary care patient of [name], FNP and is actively engaged with the care management team. I reached out to [name] by phone today to assist with re-scheduling a follow up visit with the RN Case Manager  Follow up plan: Telephone appointment with care management team member scheduled for:09/12/22  [facility] Coordination Care Guide  Direct Dial: [phone]

## 2022-08-08 ENCOUNTER — Ambulatory Visit: Payer: Medicare Other | Admitting: Physician Assistant

## 2022-08-09 DIAGNOSIS — M545 Low back pain, unspecified: Secondary | ICD-10-CM | POA: Diagnosis not present

## 2022-08-09 DIAGNOSIS — M5432 Sciatica, left side: Secondary | ICD-10-CM | POA: Diagnosis not present

## 2022-08-09 DIAGNOSIS — M25552 Pain in left hip: Secondary | ICD-10-CM | POA: Diagnosis not present
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
## 2022-08-11 ENCOUNTER — Encounter: Payer: Self-pay | Admitting: Nurse Practitioner

## 2022-08-11 ENCOUNTER — Ambulatory Visit: Payer: Medicare Other | Attending: Physician Assistant | Admitting: Nurse Practitioner

## 2022-08-11 VITALS — BP 132/68 | HR 57 | Ht 63.0 in | Wt 256.6 lb

## 2022-08-11 DIAGNOSIS — I4719 Other supraventricular tachycardia: Secondary | ICD-10-CM | POA: Diagnosis not present

## 2022-08-11 DIAGNOSIS — R0789 Other chest pain: Secondary | ICD-10-CM

## 2022-08-11 DIAGNOSIS — K219 Gastro-esophageal reflux disease without esophagitis: Secondary | ICD-10-CM | POA: Diagnosis not present

## 2022-08-11 DIAGNOSIS — I10 Essential (primary) hypertension: Secondary | ICD-10-CM

## 2022-08-11 NOTE — Patient Instructions (Signed)
Medication Instructions:  Your physician recommends that you continue on your current medications as directed. Please refer to the Current Medication list given to you today. *If you need a refill on your cardiac medications before your next appointment, please call your pharmacy*   Lab Work: BMET 1 WEEK BEFORE CARDIAC PET If you have labs (blood work) drawn today and your tests are completely normal, you will receive your results only by: MyChart Message (if you have MyChart) OR A paper copy in the mail If you have any lab test that is abnormal or we need to change your treatment, we will call you to review the results.   Testing/Procedures: Cardiac PET   Follow-Up: At [facility], you and your health needs are our priority.  As part of our continuing mission to provide you with exceptional heart care, we have created designated Provider Care Teams.  These Care Teams include your primary Cardiologist (physician) and Advanced Practice Providers (APPs -  Physician Assistants and Nurse Practitioners) who all work together to provide you with the care you need, when you need it.  We recommend signing up for the patient portal called "MyChart".  Sign up information is provided on this After Visit Summary.  MyChart is used to connect with patients for Virtual Visits (Telemedicine).  Patients are able to view lab/test results, encounter notes, upcoming appointments, etc.  Non-urgent messages can be sent to your provider as well.   To learn more about what you can do with MyChart, go to [email or web address].    Your next appointment:   3 month(s)  Provider:   Robin Searing, NP        Other Instructions  How to Prepare for Your Cardiac PET/CT Stress Test:  1. Please do not take these medications before your test:   Medications that may interfere with the cardiac pharmacological stress agent (ex. nitrates - including erectile dysfunction medications, isosorbide mononitrate,  tamulosin or beta-blockers) the day of the exam. (Erectile dysfunction medication should be held for at least 72 hrs prior to test) Theophylline containing medications for 12 hours. Dipyridamole 48 hours prior to the test. Your remaining medications may be taken with water.  2. Nothing to eat or drink, except water, 3 hours prior to arrival time.   NO caffeine/decaffeinated products, or chocolate 12 hours prior to arrival.  3. NO perfume, cologne or lotion  4. Total time is 1 to 2 hours; you may want to bring reading material for the waiting time.  5. Please report to Radiology at the [facility] Main Entrance 30 minutes early for your test.  [address] [place], [place] [address]  In preparation for your appointment, medication and supplies will be purchased.  Appointment availability is limited, so if you need to cancel or reschedule, please call the Radiology Department at [phone]  24 hours in advance to avoid a cancellation fee of $100.00  What to Expect After you Arrive:  Once you arrive and check in for your appointment, you will be taken to a preparation room within the Radiology Department.  A technologist or Nurse will obtain your medical history, verify that you are correctly prepped for the exam, and explain the procedure.  Afterwards,  an IV will be started in your arm and electrodes will be placed on your skin for EKG monitoring during the stress portion of the exam. Then you will be escorted to the PET/CT scanner.  There, staff will get you positioned on the scanner  and obtain a blood pressure and EKG.  During the exam, you will continue to be connected to the EKG and blood pressure machines.  A small, safe amount of a radioactive tracer will be injected in your IV to obtain a series of pictures of your heart along with an injection of a stress agent.    After your Exam:  It is recommended that you eat a meal and drink a caffeinated beverage to counter act  any effects of the stress agent.  Drink plenty of fluids for the remainder of the day and urinate frequently for the first couple of hours after the exam.  Your doctor will inform you of your test results within 7-10 business days.  For questions about your test or how to prepare for your test, please call: Rockwell Alexandria, Cardiac Imaging Nurse Navigator  Larey Brick, Cardiac Imaging Nurse Navigator Office: [phone]

## 2022-08-18 NOTE — Progress Notes (Signed)
Did not return for final assessment of PREP class.

## 2022-08-23 ENCOUNTER — Other Ambulatory Visit: Payer: Self-pay | Admitting: Cardiology

## 2022-08-24 ENCOUNTER — Telehealth: Payer: Self-pay | Admitting: Internal Medicine

## 2022-08-24 NOTE — Telephone Encounter (Signed)
Patient was returning call. Please advise ?

## 2022-08-24 NOTE — Telephone Encounter (Signed)
Patient is aware of heart monitor results. Verbalized understanding.

## 2022-09-12 ENCOUNTER — Ambulatory Visit: Payer: Self-pay

## 2022-09-13 NOTE — Patient Outreach (Signed)
  Care Coordination   Follow Up Visit Note   09/12/2022 Name: Rhonda [name] MRN: [id number] DOB: [birth year]  Rhonda [name] is a 72 y.o. year old female who sees Rhonda Felts, FNP for primary care. I spoke with  Rhonda [name] by phone today.  What matters to the patients health and wellness today?  Patient would like to resume the PREP program. She would like to lower her LDL and A1c.     Goals Addressed               This Visit's Progress     Patient Stated     To lower my LDL Cholesterol (pt-stated)        Care Coordination Interventions: Provider established cholesterol goals reviewed Reviewed role and benefits of statin for ASCVD risk reduction Reviewed exercise goals and target of 150 minutes per week Discussed patient would like to resume the PREP program, she will discuss this with her Cardiologist at next scheduled visit Lipid Panel     Component Value Date/Time   CHOL 201 (H) 08/03/2022 1212   TRIG 75 08/03/2022 1212   HDL 65 08/03/2022 1212   CHOLHDL 3.1 08/03/2022 1212   LDLCALC 122 (H) 08/03/2022 1212   LABVLDL 14 08/03/2022 1212         Other     To improve A1c        Care Coordination Interventions: Provided education to patient about basic DM disease process Review of patient status, including review of consultants reports, relevant laboratory and other test results, and medications completed Counseled on Diabetic diet, my plate method, 409 minutes of moderate intensity exercise/week Mailed printed educational materials related to Diabetes Management  Lab Results  Component Value Date   HGBA1C 6.5 (H) 08/03/2022        To keep Atrial fib under control        Care Coordination Interventions: Afib action plan reviewed Determined patient completed f/u with her Cardiologist for evaluation of cardiac arrhythmia  Review of patient status, including review of consultant's reports, relevant laboratory and other test results, and medications  completed Reviewed next scheduled follow up visit with Cardiology provider Rhonda Searing NP set for 11/11/22 @10 :30 AM    Interventions Today    Flowsheet Row Most Recent Value  Chronic Disease   Chronic disease during today's visit Diabetes, Atrial Fibrillation (AFib), Other  [Hyperlipidemia]  General Interventions   General Interventions Discussed/Reviewed General Interventions Discussed, General Interventions Reviewed, Labs, Doctor Visits, Lipid Profile  Doctor Visits Discussed/Reviewed Doctor Visits Discussed, Doctor Visits Reviewed, PCP, Specialist  Exercise Interventions   Exercise Discussed/Reviewed Physical Activity, Exercise Reviewed, Exercise Discussed  Physical Activity Discussed/Reviewed Physical Activity Reviewed, Physical Activity Discussed, PREP  Education Interventions   Education Provided Provided Education, Provided Printed Education  Provided Verbal Education On Nutrition, Exercise, Medication, When to see the doctor, Labs, Blood Sugar Monitoring  Labs Reviewed Hgb A1c, Kidney Function, Lipid Profile  Nutrition Interventions   Nutrition Discussed/Reviewed Nutrition Discussed, Nutrition Reviewed, Portion sizes, Fluid intake, Carbohydrate meal planning  Pharmacy Interventions   Pharmacy Dicussed/Reviewed Medications and their functions, Pharmacy Topics Discussed, Pharmacy Topics Reviewed          SDOH assessments and interventions completed:  No     Care Coordination Interventions:  Yes, provided   Follow up plan: Follow up call scheduled for 11/15/22 @10 :30 AM    Encounter Outcome:  Pt. Visit Completed

## 2022-09-13 NOTE — Patient Instructions (Signed)
Visit Information  Thank you for taking time to visit with me today. Please don't hesitate to contact me if I can be of assistance to you.   Following are the goals we discussed today:   Goals Addressed               This Visit's Progress     Patient Stated     To lower my LDL Cholesterol (pt-stated)        Care Coordination Interventions: Provider established cholesterol goals reviewed Reviewed role and benefits of statin for ASCVD risk reduction Reviewed exercise goals and target of 150 minutes per week Discussed patient would like to resume the PREP program, she will discuss this with her Cardiologist at next scheduled visit Lipid Panel     Component Value Date/Time   CHOL 201 (H) 08/03/2022 1212   TRIG 75 08/03/2022 1212   HDL 65 08/03/2022 1212   CHOLHDL 3.1 08/03/2022 1212   LDLCALC 122 (H) 08/03/2022 1212   LABVLDL 14 08/03/2022 1212         Other     To improve A1c        Care Coordination Interventions: Provided education to patient about basic DM disease process Review of patient status, including review of consultants reports, relevant laboratory and other test results, and medications completed Counseled on Diabetic diet, my plate method, 161 minutes of moderate intensity exercise/week Mailed printed educational materials related to Diabetes Management  Lab Results  Component Value Date   HGBA1C 6.5 (H) 08/03/2022       To keep Atrial fib under control        Care Coordination Interventions: Afib action plan reviewed Determined patient completed f/u with her Cardiologist for evaluation of cardiac arrhythmia  Review of patient status, including review of consultant's reports, relevant laboratory and other test results, and medications completed Reviewed next scheduled follow up visit with Cardiology provider Robin Searing NP set for 11/11/22 @10 :30 AM        Our next appointment is by telephone on 11/15/22 at 10:30 AM  Please call the care guide team at  [phone] if you need to cancel or reschedule your appointment.   If you are experiencing a Mental Health or Behavioral Health Crisis or need someone to talk to, please call 1-800-273-TALK (toll free, 24 hour hotline) go to [facility] [address] [place] [phone])  Patient verbalizes understanding of instructions and care plan provided today and agrees to view in MyChart. Active MyChart status and patient understanding of how to access instructions and care plan via MyChart confirmed with patient.     Delsa Sale, RN, BSN, CCM Care Management Coordinator [facility] Care Management Direct Phone: [phone]

## 2022-10-06 ENCOUNTER — Other Ambulatory Visit: Payer: Self-pay | Admitting: Nurse Practitioner

## 2022-10-06 DIAGNOSIS — E66813 Obesity, class 3: Secondary | ICD-10-CM

## 2022-10-06 DIAGNOSIS — E669 Obesity, unspecified: Secondary | ICD-10-CM

## 2022-10-06 DIAGNOSIS — E1169 Type 2 diabetes mellitus with other specified complication: Secondary | ICD-10-CM

## 2022-10-07 ENCOUNTER — Telehealth: Payer: Self-pay

## 2022-10-07 NOTE — Telephone Encounter (Signed)
VMT pt requesting call back reference starting PREP in June

## 2022-10-10 ENCOUNTER — Telehealth: Payer: Self-pay

## 2022-10-10 NOTE — Telephone Encounter (Signed)
Call from pt.  Can start PREP 10/17/22 MW 1p-215p Intake scheduled for 10/12/22 at 130p Will meet her in the lobby of the [facility]
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
## 2022-10-19 NOTE — Progress Notes (Signed)
[facility] PREP Weekly Session  Patient Details  Name: Rhonda [name] MRN: [id number] Date of Birth: 1950-11-16 Age: 72 y.o. PCP: [name], FNP  There were no vitals filed for this visit.   [facility] Weekly seesion - 10/19/22 1500       [facility] "PREP" Location   [facility] "PREP" Location [facility]      Weekly Session   Topic Discussed Goal setting and welcome to the program   Fit testing   Classes attended to date 2             [name] 10/19/2022, 3:26 PM

## 2022-10-25 NOTE — Progress Notes (Signed)
[facility] PREP Weekly Session  Patient Details  Name: ACHAIA GARLOCK MRN: [id number] Date of Birth: July 13, 1950 Age: 72 y.o. PCP: [name], FNP  Vitals:   10/24/22 1300  Weight: 253 lb 9.6 oz (115 kg)     [facility] Weekly seesion - 10/25/22 1100       [facility] "PREP" Location   [facility] "PREP" Location [facility]      Weekly Session   Topic Discussed Other ways to be active;Importance of resistance training    Minutes exercised this week 20 minutes    Classes attended to date 3             [name] 10/25/2022, 11:21 AM

## 2022-11-01 NOTE — Progress Notes (Signed)
[facility] PREP Weekly Session  Patient Details  Name: Rhonda [name] MRN: [id number] Date of Birth: Aug 19, 1950 Age: 72 y.o. PCP: [name], FNP  Vitals:   10/31/22 1300  Weight: 256 lb (116.1 kg)     [facility] Weekly seesion - 11/01/22 0900       [facility] "PREP" Location   [facility]PREP" [facility]      Weekly Session   Topic Discussed Healthy eating tips    Minutes exercised this week 30 minutes    Classes attended to date 4             Pam Jerral Bonito 11/01/2022, 9:25 AM

## 2022-11-08 NOTE — Progress Notes (Signed)
[facility] PREP Weekly Session  Patient Details  Name: Rhonda [name] MRN: [id number] Date of Birth: Jun 05, 1950 Age: 72 y.o. PCP: [name], FNP  Vitals:     [facility] Weekly seesion - 11/08/22 0900       [facility] "PREP" Location   [facility]      Weekly Session   Topic Discussed Health habits    Minutes exercised this week 30 minutes    Classes attended to date 5             Pam Jerral Bonito 11/08/2022, 9:51 AM
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
## 2022-11-11 ENCOUNTER — Encounter: Payer: Self-pay | Admitting: Nurse Practitioner

## 2022-11-11 ENCOUNTER — Ambulatory Visit: Payer: Medicare Other | Attending: Nurse Practitioner | Admitting: Nurse Practitioner

## 2022-11-11 DIAGNOSIS — I10 Essential (primary) hypertension: Secondary | ICD-10-CM

## 2022-11-11 DIAGNOSIS — I4719 Other supraventricular tachycardia: Secondary | ICD-10-CM

## 2022-11-11 DIAGNOSIS — I2510 Atherosclerotic heart disease of native coronary artery without angina pectoris: Secondary | ICD-10-CM

## 2022-11-11 DIAGNOSIS — E782 Mixed hyperlipidemia: Secondary | ICD-10-CM

## 2022-11-15 ENCOUNTER — Ambulatory Visit: Payer: Self-pay

## 2022-11-15 NOTE — Patient Instructions (Signed)
Visit Information  Thank you for taking time to visit with me today. Please don't hesitate to contact me if I can be of assistance to you.   Following are the goals we discussed today:   Goals Addressed             This Visit's Progress    To improve A1c       Care Coordination Interventions: Evaluation of current treatment plan related to type 2 diabetes mellitus  and patient's adherence to plan as established by provider Determined patient received and reviewed the previously mailed diabetic educational materials and denies having questions at this time  Lab Results  Component Value Date   HGBA1C 6.5 (H) 08/03/2022      To keep Atrial fib under control       Care Coordination Interventions: Evaluation of current treatment plan related to atrial fib and patient's adherence to plan as established by provider Determined patient missed her Cardiology follow up scheduled for 11/11/22, she had forgotten about the appointment  Instructed patient to contact her Cardiologist to reschedule her office visit and patient is agreeable  Determined patient is currently not experiencing symptoms suggestive of atrial fibrillation  Counseled patient on the importance of adhering to all scheduled appointments         Our next appointment is by telephone on 01/03/23 at 09:00 AM  Please call the care guide team at [phone] if you need to cancel or reschedule your appointment.   If you are experiencing a Mental Health or Behavioral Health Crisis or need someone to talk to, please call 1-800-273-TALK (toll free, 24 hour hotline)  Patient verbalizes understanding of instructions and care plan provided today and agrees to view in MyChart. Active MyChart status and patient understanding of how to access instructions and care plan via MyChart confirmed with patient.     Delsa Sale, RN, BSN, CCM Care Management Coordinator [facility] Care Management  Direct Phone: [phone]

## 2022-11-15 NOTE — Patient Outreach (Signed)
  Care Coordination   Follow Up Visit Note   11/15/2022 Name: Rhonda [name] MRN: [id number] DOB: Nov 19, 1950  Rhonda [name] is a 73 y.o. year old female who sees Rhonda Felts, FNP for primary care. I spoke with  Rhonda [name] by phone today.  What matters to the patients health and wellness today?  Patient will continue to adhere to a diabetic friendly diet. She will reschedule her missed Cardiology appointment with the A-fib clinic.     Goals Addressed             This Visit's Progress    To improve A1c       Care Coordination Interventions: Evaluation of current treatment plan related to type 2 diabetes mellitus  and patient's adherence to plan as established by provider Determined patient received and reviewed the previously mailed diabetic educational materials and denies having questions at this time  Lab Results  Component Value Date   HGBA1C 6.5 (H) 08/03/2022       To keep Atrial fib under control       Care Coordination Interventions: Evaluation of current treatment plan related to atrial fib and patient's adherence to plan as established by provider Determined patient missed her Cardiology follow up scheduled for 11/11/22, she had forgotten about the appointment  Instructed patient to contact her Cardiologist to reschedule her office visit and patient is agreeable  Determined patient is currently not experiencing symptoms suggestive of atrial fibrillation  Counseled patient on the importance of adhering to all scheduled appointments      Interventions Today    Flowsheet Row Most Recent Value  Chronic Disease   Chronic disease during today's visit Diabetes, Atrial Fibrillation (AFib)  General Interventions   General Interventions Discussed/Reviewed General Interventions Discussed, General Interventions Reviewed, Doctor Visits, Labs  Doctor Visits Discussed/Reviewed Doctor Visits Discussed, Doctor Visits Reviewed, Specialist  Exercise Interventions    Exercise Discussed/Reviewed Physical Activity, Exercise Reviewed, Exercise Discussed  Physical Activity Discussed/Reviewed Physical Activity Reviewed, Physical Activity Discussed, PREP  Education Interventions   Education Provided Provided Education  Provided Verbal Education On Nutrition, Exercise, Labs, When to see the doctor, Medication  Labs Reviewed Hgb A1c  Nutrition Interventions   Nutrition Discussed/Reviewed Nutrition Discussed, Nutrition Reviewed, Fluid intake, Carbohydrate meal planning, Portion sizes          SDOH assessments and interventions completed:  No     Care Coordination Interventions:  Yes, provided   Follow up plan: Follow up call scheduled for 01/03/23 @09 :00 AM    Encounter Outcome:  Pt. Visit Completed

## 2022-11-15 NOTE — Progress Notes (Signed)
[facility] PREP Weekly Session  Patient Details  Name: Rhonda [name] MRN: [id number] Date of Birth: 1951/03/29 Age: 72 y.o. PCP: [name], FNP  Vitals:   11/14/22 1300  Weight: 251 lb (113.9 kg)     [facility] Weekly seesion - 11/15/22 1000       [facility] "PREP" Location   [facility] "PREP" Location [facility]      Weekly Session   Topic Discussed Restaurant Eating   salt and sugar demos   Minutes exercised this week 90 minutes    Classes attended to date 7             Pam Jerral Bonito 11/15/2022, 10:15 AM

## 2022-11-22 ENCOUNTER — Other Ambulatory Visit: Payer: Self-pay | Admitting: Internal Medicine

## 2022-11-22 DIAGNOSIS — Z79899 Other long term (current) drug therapy: Secondary | ICD-10-CM

## 2022-11-22 DIAGNOSIS — E785 Hyperlipidemia, unspecified: Secondary | ICD-10-CM

## 2022-11-24 NOTE — Progress Notes (Signed)
[facility] PREP Weekly Session  Patient Details  Name: Rhonda [name] MRN: [id number] Date of Birth: 1950-10-25 Age: 72 y.o. PCP: [name], FNP  There were no vitals filed for this visit.   [facility] Weekly seesion - 11/24/22 1700       [facility] "PREP" Location   [facility]PREP" [facility]      Weekly Session   Topic Discussed Stress management and problem solving   relaxation meditation   Minutes exercised this week 85 minutes    Classes attended to date 10             [name] 11/24/2022, 5:03 PM

## 2022-12-06 ENCOUNTER — Encounter: Payer: Self-pay | Admitting: Dietician

## 2022-12-06 ENCOUNTER — Encounter: Payer: Medicare Other | Attending: Nurse Practitioner | Admitting: Dietician

## 2022-12-06 VITALS — Wt 248.3 lb

## 2022-12-06 DIAGNOSIS — E119 Type 2 diabetes mellitus without complications: Secondary | ICD-10-CM | POA: Diagnosis not present

## 2022-12-06 NOTE — Progress Notes (Signed)
Patient was seen on 12/06/22 for the first of a series of three diabetes self-management courses [facility]  Patient Education Plan per assessed needs and concerns is to attend three course education program for Diabetes Self Management Education.  A1C was 6.5 on 08/03/22.  The following learning objectives were met by the patient during this class: Describe diabetes, types of diabetes and pathophysiology State some common risk factors for diabetes Defines the role of glucose and insulin Describe the relationship between diabetes and cardiovascular and other risks State the members of the Healthcare Team States the rationale for glucose monitoring and when to test State their individual Target Range State the importance of logging glucose readings and how to interpret the readings Identifies A1C target Explain the correlation between A1c and eAG values State symptoms and treatment of high blood glucose and low blood glucose Explain proper technique for glucose testing and identify proper sharps disposal  Handouts given during class include: How to Thrive:  A Guide for Your Journey with Diabetes by the ADA Meal Plan Card and carbohydrate content list Dietary intake form Low Sodium Flavoring Tips Types of Fats Dining Out Label reading Snack list The diabetes portion plate Diabetes Resources A1c to eAG Conversion Chart Blood Glucose Log Diabetes Recommended Care Schedule Support Group Diabetes Success Plan Core Class Satisfaction Survey   Follow-Up Plan: Attend core 2

## 2022-12-07 NOTE — Progress Notes (Signed)
[facility] PREP Weekly Session  Patient Details  Name: TATISHA FAKHOURI MRN: [id number] Date of Birth: Apr 11, 1951 Age: 72 y.o. PCP: [name], FNP  There were no vitals filed for this visit.   [facility] Weekly seesion - 12/07/22 0900       [facility] "PREP" Location   [facility]      Weekly Session   Topic Discussed --   portions   Minutes exercised this week 120 minutes    Classes attended to date 12             [name] 12/07/2022, 9:37 AM

## 2022-12-08 ENCOUNTER — Other Ambulatory Visit: Payer: Self-pay | Admitting: Nurse Practitioner

## 2022-12-13 ENCOUNTER — Encounter: Payer: Medicare Other | Admitting: Dietician

## 2022-12-13 ENCOUNTER — Encounter: Payer: Self-pay | Admitting: Dietician

## 2022-12-13 DIAGNOSIS — E119 Type 2 diabetes mellitus without complications: Secondary | ICD-10-CM

## 2022-12-13 NOTE — Progress Notes (Signed)
Patient was seen on 12/13/22 for the second of a series of three diabetes self-management courses at [facility]. The following learning objectives were met by the patient during this class:  Describe the role of different macronutrients on glucose Explain how carbohydrates affect blood glucose State what foods contain the most carbohydrates Demonstrate carbohydrate counting Demonstrate how to read Nutrition Facts food label Describe effects of various fats on heart health Describe the importance of good nutrition for health and healthy eating strategies Describe techniques for managing your shopping, cooking and meal planning List strategies to follow meal plan when dining out Describe the effects of alcohol on glucose and how to use it safely  Goals:  Follow Diabetes Meal Plan as instructed  Aim to spread carbs evenly throughout the day  Aim for 3 meals per day and snacks as needed Include lean protein foods to meals/snacks  Monitor glucose levels as instructed by your doctor   Follow-Up Plan: Attend Core 3 Work towards following your personal food plan.

## 2022-12-15 NOTE — Progress Notes (Signed)
[facility] PREP Weekly Session  Patient Details  Name: MARIJA MURNER MRN: [id number] Date of Birth: 01/24/1951 Age: 72 y.o. PCP: [name], FNP  There were no vitals filed for this visit.   [facility] Weekly seesion - 12/15/22 1600       [facility] "PREP" Location   [facility] "PREP" Location [facility]      Weekly Session   Topic Discussed Finding support    Minutes exercised this week 60 minutes    Classes attended to date 71             [name] 12/15/2022, 4:25 PM

## 2022-12-19 ENCOUNTER — Ambulatory Visit: Payer: Medicare Other | Admitting: Registered"

## 2022-12-20 ENCOUNTER — Encounter: Payer: Self-pay | Admitting: Dietician

## 2022-12-20 ENCOUNTER — Encounter: Payer: Medicare Other | Admitting: Dietician

## 2022-12-20 DIAGNOSIS — E119 Type 2 diabetes mellitus without complications: Secondary | ICD-10-CM

## 2022-12-20 NOTE — Progress Notes (Signed)
Patient was seen on 12/20/22 for the third of a series of three diabetes self-management courses at [facility].   State the amount of activity recommended for healthy living Describe activities suitable for individual needs Identify ways to regularly incorporate activity into daily life Identify barriers to activity and ways to over come these barriers Identify diabetes medications being personally used and their primary action for lowering glucose and possible side effects Describe role of stress on blood glucose and develop strategies to address psychosocial issues Identify diabetes complications and ways to prevent them Explain how to manage diabetes during illness Evaluate success in meeting personal goal Establish 2-3 goals that they will plan to diligently work on  Goals:   I will be active 30 minutes or more 3 times a week I will look at patterns in my record book at least 4 days a month  Your patient has identified these potential barriers to change:  Motivation Finances Stress Lack of Family Support  Your patient has identified their diabetes self-care support plan as  Family Education officer, environmental Resources [facility] Support Group  American Diabetes Association Website Did not identify    Plan:  Attend Support Group as desired

## 2022-12-27 NOTE — Progress Notes (Signed)
[facility] PREP Weekly Session  Patient Details  Name: Rhonda [name] MRN: [id number] Date of Birth: 1950/10/16 Age: 72 y.o. PCP: [name], FNP  There were no vitals filed for this visit.   [facility] Weekly seesion - 12/27/22 1700       [facility] "PREP" Location   [facility]      Weekly Session   Topic Discussed Hitting roadblocks    Minutes exercised this week 60 minutes    Classes attended to date 57             Pam Jerral Bonito 12/27/2022, 5:09 PM
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
## 2022-12-30 ENCOUNTER — Telehealth: Payer: Self-pay

## 2022-12-30 ENCOUNTER — Encounter: Payer: Self-pay | Admitting: Nurse Practitioner

## 2022-12-30 ENCOUNTER — Ambulatory Visit: Payer: Medicare Other | Attending: Nurse Practitioner | Admitting: Nurse Practitioner

## 2022-12-30 VITALS — BP 136/64 | HR 54 | Ht 63.0 in | Wt 253.4 lb

## 2022-12-30 DIAGNOSIS — R42 Dizziness and giddiness: Secondary | ICD-10-CM

## 2022-12-30 DIAGNOSIS — I2510 Atherosclerotic heart disease of native coronary artery without angina pectoris: Secondary | ICD-10-CM

## 2022-12-30 DIAGNOSIS — I10 Essential (primary) hypertension: Secondary | ICD-10-CM | POA: Diagnosis not present

## 2022-12-30 DIAGNOSIS — I4719 Other supraventricular tachycardia: Secondary | ICD-10-CM | POA: Diagnosis not present

## 2022-12-30 DIAGNOSIS — Z0000 Encounter for general adult medical examination without abnormal findings: Secondary | ICD-10-CM

## 2022-12-30 DIAGNOSIS — E782 Mixed hyperlipidemia: Secondary | ICD-10-CM

## 2022-12-30 MED ORDER — BLOOD PRESSURE CUFF MISC: 1.0000 | Freq: Every day | 0 refills | Status: AC

## 2022-12-30 MED ORDER — FUROSEMIDE 20 MG PO TABS: 20.0000 mg | ORAL_TABLET | Freq: Every day | ORAL | 1 refills | Status: DC | PRN

## 2022-12-30 NOTE — Telephone Encounter (Signed)
Called patient per health coaching referral for healthy eating habits. Patient did not answer. Left message for patient to return call.   Renaee Munda, MS, ERHD, [facility]  Care Guide, Health & Wellness Coach [address] [place] [address] Telephone: [phone] Email: [email or web address]

## 2022-12-30 NOTE — Patient Instructions (Addendum)
Medication Instructions:  START Lasix 20mg  Take 1 tablet once a day as needed for weight gain of 2lbs in a day or 5lbs in a week  DRINK SOME Glucerna in the mornings  *If you need a refill on your cardiac medications before your next appointment, please call your pharmacy*   Lab Work: None ordered   Testing/Procedures: None ordered   Follow-Up: At [facility], you and your health needs are our priority.  As part of our continuing mission to provide you with exceptional heart care, we have created designated Provider Care Teams.  These Care Teams include your primary Cardiologist (physician) and Advanced Practice Providers (APPs -  Physician Assistants and Nurse Practitioners) who all work together to provide you with the care you need, when you need it.  We recommend signing up for the patient portal called "MyChart".  Sign up information is provided on this After Visit Summary.  MyChart is used to connect with patients for Virtual Visits (Telemedicine).  Patients are able to view lab/test results, encounter notes, upcoming appointments, etc.  Non-urgent messages can be sent to your provider as well.   To learn more about what you can do with MyChart, go to [email or web address].    Your next appointment:   12 month(s)  Provider:   Dietrich Pates, MD     Other Instructions Get some ted hose and wear during the day and remove at night  You have been referred to Care Guide

## 2023-01-03 ENCOUNTER — Other Ambulatory Visit: Payer: Self-pay

## 2023-01-03 ENCOUNTER — Ambulatory Visit: Payer: Self-pay

## 2023-01-03 MED ORDER — BLOOD GLUCOSE TEST VI STRP: 1.0000 | ORAL_STRIP | Freq: Three times a day (TID) | 0 refills | Status: AC

## 2023-01-03 MED ORDER — BLOOD GLUCOSE MONITORING SUPPL DEVI: 1.0000 | Freq: Three times a day (TID) | 0 refills | Status: DC

## 2023-01-03 MED ORDER — LANCETS MISC. MISC: 1.0000 | Freq: Three times a day (TID) | 0 refills | Status: AC

## 2023-01-03 MED ORDER — LANCET DEVICE MISC: 1.0000 | Freq: Three times a day (TID) | 0 refills | Status: AC

## 2023-01-03 NOTE — Patient Outreach (Signed)
Care Coordination   Follow Up Visit Note   01/03/2023 Name: Rhonda [name] MRN: [id number] DOB: [birth year]  Rhonda [name] is a 72 y.o. year old female who sees [name], FNP for primary care. I spoke with  Rhonda [name] by phone today.  What matters to the patients health and wellness today?  Patient would like to start monitoring her blood sugars and blood pressure at home.     Goals Addressed             This Visit's Progress    To improve A1c   On track    Care Coordination Interventions: Provided education to patient about basic DM disease process Review of patient status, including review of consultants reports, relevant laboratory and other test results, and medications completed Reviewed medications with patient and discussed importance of medication adherence Counseled on importance of regular laboratory monitoring as prescribed Provided patient with written educational materials related to hypo and hyperglycemia and importance of correct treatment Advised patient, providing education and rationale, to check cbg daily before meals and at bedtime  and record, calling PCP for findings outside established parameters Determined patient is currently not monitoring her cbg's, sent secure message to PCP requesting an Rx be sent to preferred pharmacy for a new glucose meter/kit Counseled on Diabetic diet, my plate method, 829 minutes of moderate intensity exercise/week Mailed printed educational materials related to diabetes management Lab Results  Component Value Date   HGBA1C 6.5 (H) 08/03/2022        To keep Atrial fib under control   On track    Care Coordination Interventions: Evaluation of current treatment plan related to atrial fibrillation  and patient's adherence to plan as established by provider Review of patient status, including review of consultant's reports, relevant laboratory and other test results, and medications completed Educated patient  re: obtaining a home blood pressure monitor via use of her OTC benefit with [facility] Advised patient, providing education and rationale, to monitor blood pressure daily and record, calling PCP for findings outside established parameters Provided verbal and written instructions on how to accurately monitor her BP at home  Last practice recorded BP readings:  BP Readings from Last 3 Encounters:  12/30/22 136/64  10/12/22 (!) 108/54  08/11/22 132/68   Most recent eGFR/CrCl:  Lab Results  Component Value Date   EGFR 82 03/30/2022    No components found for: "CRCL"    Interventions Today    Flowsheet Row Most Recent Value  Chronic Disease   Chronic disease during today's visit Diabetes, Hypertension (HTN)  General Interventions   General Interventions Discussed/Reviewed General Interventions Discussed, General Interventions Reviewed, Labs, Doctor Visits, Durable Medical Equipment (DME), Communication with  Doctor Visits Discussed/Reviewed Doctor Visits Discussed, Doctor Visits Reviewed, PCP, Specialist  Durable Medical Equipment (DME) Glucomoter  Communication with PCP/Specialists  [name] FNP]  Exercise Interventions   Exercise Discussed/Reviewed Exercise Discussed, Exercise Reviewed, Physical Activity  Physical Activity Discussed/Reviewed Physical Activity Discussed, Physical Activity Reviewed, PREP  Education Interventions   Education Provided Provided Education, Provided Printed Education  Provided Verbal Education On When to see the doctor, Labs, Medication, Exercise, Nutrition, Blood Sugar Monitoring  Labs Reviewed Hgb A1c, Kidney Function  Nutrition Interventions   Nutrition Discussed/Reviewed Nutrition Discussed, Nutrition Reviewed, Fluid intake, Portion sizes, Carbohydrate meal planning  Pharmacy Interventions   Pharmacy Dicussed/Reviewed Pharmacy Topics Discussed, Pharmacy Topics Reviewed, Medications and their functions          SDOH assessments and interventions  completed:  No     Care Coordination Interventions:  Yes, provided   Follow up plan: Follow up call scheduled for 01/31/23 @10 :30 AM    Encounter Outcome:  Pt. Visit Completed

## 2023-01-03 NOTE — Patient Instructions (Signed)
Visit Information  Thank you for taking time to visit with me today. Please don't hesitate to contact me if I can be of assistance to you.   Following are the goals we discussed today:   Goals Addressed             This Visit's Progress    To improve A1c   On track    Care Coordination Interventions: Provided education to patient about basic DM disease process Review of patient status, including review of consultants reports, relevant laboratory and other test results, and medications completed Reviewed medications with patient and discussed importance of medication adherence Counseled on importance of regular laboratory monitoring as prescribed Provided patient with written educational materials related to hypo and hyperglycemia and importance of correct treatment Advised patient, providing education and rationale, to check cbg daily before meals and at bedtime  and record, calling PCP for findings outside established parameters Determined patient is currently not monitoring her cbg's, sent secure message to PCP requesting an Rx be sent to preferred pharmacy for a new glucose meter/kit Counseled on Diabetic diet, my plate method, 409 minutes of moderate intensity exercise/week Mailed printed educational materials related to diabetes management Lab Results  Component Value Date   HGBA1C 6.5 (H) 08/03/2022       To keep Atrial fib under control   On track    Care Coordination Interventions: Evaluation of current treatment plan related to atrial fibrillation  and patient's adherence to plan as established by provider Review of patient status, including review of consultant's reports, relevant laboratory and other test results, and medications completed Educated patient re: obtaining a home blood pressure monitor via use of her OTC benefit with [facility] Advised patient, providing education and rationale, to monitor blood pressure daily and record, calling PCP for findings outside established  parameters Provided verbal and written instructions on how to accurately monitor her BP at home  Last practice recorded BP readings:  BP Readings from Last 3 Encounters:  12/30/22 136/64  10/12/22 (!) 108/54  08/11/22 132/68   Most recent eGFR/CrCl:  Lab Results  Component Value Date   EGFR 82 03/30/2022    No components found for: "CRCL"        Our next appointment is by telephone on 01/31/23 at 10:30 AM  Please call the care guide team at [phone] if you need to cancel or reschedule your appointment.   If you are experiencing a Mental Health or Behavioral Health Crisis or need someone to talk to, please call 1-800-273-TALK (toll free, 24 hour hotline)  Patient verbalizes understanding of instructions and care plan provided today and agrees to view in MyChart. Active MyChart status and patient understanding of how to access instructions and care plan via MyChart confirmed with patient.     Delsa Sale, RN, BSN, CCM Care Management Coordinator [facility] Care Management  Direct Phone: [phone]

## 2023-01-05 ENCOUNTER — Telehealth: Payer: Self-pay

## 2023-01-05 DIAGNOSIS — Z0000 Encounter for general adult medical examination without abnormal findings: Secondary | ICD-10-CM

## 2023-01-05 NOTE — Telephone Encounter (Signed)
Called patient to discuss health coaching referral for healthy eating and weight loss per Robin Searing, NP. Patient did not answer. Left message for patient to return call.   Renaee Munda, MS, ERHD, [facility]  Care Guide, Health & Wellness Coach [address] [place] [address] Telephone: [phone] Email: [email or web address]

## 2023-01-05 NOTE — Telephone Encounter (Signed)
Returned patient's call regarding health coaching. Patient stated that she is interested in participating in health coaching. Patient has been scheduled for an in-person visit at [facility] on 9/11 at 1:30pm.   Renaee Munda, MS, ERHD, [facility]  Care Guide, Health & Wellness Coach [address] [place] [address] Telephone: [phone] Email: [email or web address]

## 2023-01-09 ENCOUNTER — Encounter: Payer: Self-pay | Admitting: Family Medicine

## 2023-01-09 ENCOUNTER — Other Ambulatory Visit: Payer: Self-pay

## 2023-01-09 ENCOUNTER — Ambulatory Visit: Payer: Medicare Other | Admitting: Family Medicine

## 2023-01-09 VITALS — BP 142/60 | HR 64 | Temp 98.2°F | Resp 16 | Wt 245.8 lb

## 2023-01-09 DIAGNOSIS — J3089 Other allergic rhinitis: Secondary | ICD-10-CM

## 2023-01-09 DIAGNOSIS — H60591 Other noninfective acute otitis externa, right ear: Secondary | ICD-10-CM

## 2023-01-09 DIAGNOSIS — H6090 Unspecified otitis externa, unspecified ear: Secondary | ICD-10-CM | POA: Insufficient documentation

## 2023-01-09 DIAGNOSIS — J4540 Moderate persistent asthma, uncomplicated: Secondary | ICD-10-CM | POA: Diagnosis not present

## 2023-01-09 MED ORDER — CIPROFLOXACIN-DEXAMETHASONE 0.3-0.1 % OT SUSP: 4.0000 [drp] | Freq: Two times a day (BID) | OTIC | 0 refills | Status: DC

## 2023-01-09 MED ORDER — PREDNISONE 10 MG PO TABS: ORAL_TABLET | ORAL | 0 refills | Status: DC

## 2023-01-09 NOTE — Patient Instructions (Addendum)
  1.  Treat inflammation of airway:  A. Wixela 250 - 1 inhalation 2 times per day  B. Prednisone 20 mg daily for 4 days, then take 10 mg on the 5th day, then stop  2.  Treat outer ear infection: Begin ciprodex 4 drops in the right ear twice a day for 7 days  3.  To treat nasal inflammation:  Levocetirizine 5 mg - 1 tablet 1 time per day  Flonase - 1-2 sprays each nostril daily   Nasal saline rinses before Flonase   Begin Mucinex [phone] twice a day  4. If needed:  Albuterol HFA - 2 inhalations every 4-6 hours  Return to clinic in 2 months or earlier if problem  Call the clinic if your symptoms worsen or if you develop a fever

## 2023-01-09 NOTE — Progress Notes (Signed)
[address] [place] [address] Dept: [phone]  FOLLOW UP NOTE  Patient ID: Rhonda [name], female    DOB: 1951/03/18  Age: 72 y.o. MRN: [id number] Date of Office Visit: 01/09/2023  Assessment  Chief Complaint: Dizziness (Ear popping. Started on Friday. Getting worse.)  HPI Rhonda [name] is a 72 year old female who presents to the clinic for follow-up visit.  She was last seen in this clinic on 08/03/2021 by Dr. Lucie Leather for evaluation of not well-controlled asthma, idiopathic urticaria, and perennial allergic rhinitis.  She is accompanied by her daughter who assists with history.  At today's visit, she reports that she began to experience symptoms that began on Friday including shortness of breath with rest and activity, copious postnasal drainage, itching and popping in both the ears, and dizziness.  She denies fever, sweats, chills, or sick contacts.  Asthma is reported as well-controlled with no symptoms before Friday when she developed shortness of breath with activity and rest and slight chest heaviness..  She reports that she had stopped using Wixela about 1 to 2 months ago and has recently started using Wixela 250-1 puff twice a day beginning on Friday.  She reports that she has not used her albuterol over the last several months.  Allergic rhinitis is reported as moderately well-controlled with no symptoms before Friday.  She reports that she had been taking Xyzal with good control of allergic rhinitis, however, she stopped taking Xyzal about 1 month ago because she was not having any allergic rhinitis symptoms.  She reports that she began to experience copious postnasal drainage on Friday and she began taking Xyzal at that time.  She reports a slight headache above both eyes and some nasal congestion that began on Friday.  She is not currently using Flonase or nasal saline rinses.  She reports itch in both the ears with popping and dizziness that began on Friday.  She  does report that she has been scratching with her fingernails in both the ears. She denies a change in hearing or drainage from either ear.   She denies any episodes of urticaria since her last visit to this clinic.   Her current medications are listed in the chart.    Drug Allergies:  Allergies  Allergen Reactions   Aspirin-Acetaminophen-Caffeine Nausea And Vomiting   Codeine Diarrhea and Nausea And Vomiting    Stomach cramps    Oxycontin [Oxycodone Hcl] Other (See Comments)    Hallucination     Physical Exam: BP (!) 142/60   Pulse 64   Temp 98.2 F (36.8 C) (Temporal)   Resp 16   Wt 245 lb 12.8 oz (111.5 kg)   SpO2 98%   BMI 43.54 kg/m    Physical Exam Vitals reviewed.  Constitutional:      Appearance: Normal appearance.  HENT:     Head: Normocephalic and atraumatic.     Ears:     Comments: Right ear canal with a linear scabbed over scratch. Unable to visualize right TM.  Left ear canal within normal limit. Left TM within normal limit.     Nose:     Comments: Bilateral nares slightly erythematous with thick clear nasal drainage noted. Pharynx normal. Eyes normal    Mouth/Throat:     Pharynx: Oropharynx is clear.  Eyes:     Conjunctiva/sclera: Conjunctivae normal.  Cardiovascular:     Rate and Rhythm: Normal rate and regular rhythm.     Heart sounds: Normal heart sounds. No murmur heard.  Pulmonary:     Effort: Pulmonary effort is normal.     Breath sounds: Normal breath sounds.     Comments: Lungs clear to auscultation Musculoskeletal:        General: Normal range of motion.     Cervical back: Normal range of motion and neck supple.  Skin:    General: Skin is warm and dry.  Neurological:     Mental Status: She is alert and oriented to person, place, and time.  Psychiatric:        Mood and Affect: Mood normal.        Behavior: Behavior normal.        Thought Content: Thought content normal.        Judgment: Judgment normal.     Diagnostics: FVC  2.02  which is 89% of predicted value. FEV1 1.78 which is 101% orf predicted value. Spirometry indicates normal ventilatory function.  Assessment and Plan: 1. Not well controlled moderate persistent asthma   2. Other noninfective acute otitis externa of right ear   3. Allergic rhinitis due to other allergic trigger, unspecified seasonality     Meds ordered this encounter  Medications   predniSONE (DELTASONE) 10 MG tablet    Sig: Prednisone 10 mg tablets. Take 2 tablets once a day for 4 days, then take 1 tablet on the 5th day, then stop.    Dispense:  9 tablet    Refill:  0   ciprofloxacin-dexamethasone (CIPRODEX) OTIC suspension    Sig: Place 4 drops into the right ear 2 (two) times daily.    Dispense:  7.5 mL    Refill:  0    Patient Instructions   1.  Treat inflammation of airway:  A. Wixela 250 - 1 inhalation 2 times per day  B. Prednisone 20 mg daily for 4 days, then take 10 mg on the 5th day, then stop  2.  Treat outer ear infection: Begin ciprodex 4 drops in the right ear twice a day for 7 days  3.  To treat nasal inflammation:  Levocetirizine 5 mg - 1 tablet 1 time per day  Flonase - 1-2 sprays each nostril daily   Nasal saline rinses before Flonase   Begin Mucinex [phone] twice a day  4. If needed:  Albuterol HFA - 2 inhalations every 4-6 hours  Return to clinic in 2 months or earlier if problem  Call the clinic if your symptoms worsen or if you develop a fever  Return in about 2 months (around 03/11/2023), or if symptoms worsen or fail to improve.    Thank you for the opportunity to care for this patient.  Please do not hesitate to contact me with questions.  Thermon Leyland, FNP [facility]

## 2023-01-10 ENCOUNTER — Telehealth: Payer: Self-pay | Admitting: Family Medicine

## 2023-01-10 MED ORDER — OFLOXACIN 0.3 % OP SOLN: OPHTHALMIC | 0 refills | Status: DC

## 2023-01-10 NOTE — Telephone Encounter (Signed)
She does not need to use the drops in her left ear. Please have her continue to use saline followed by Flonase and take the prednisone. She can also take mucinex [phone] mg twice a day to thin out the mucus. Thank you

## 2023-01-10 NOTE — Telephone Encounter (Signed)
Rhonda [name] was prescribed Ciprodex 4 drops in each ear. She states she can't afford it and wants to know if there is something else she can be prescribed. When she woke up this morning both her ears were hurting.   Eusebio Me [phone]

## 2023-01-10 NOTE — Telephone Encounter (Signed)
Patient called stating she has some questions about the ear drops she was prescribed on yesterday. Patient states she can not afford the ear drops after insurance and a coupon the ear drops are still $75. The patient is wanting to know of an alternative that she can be prescribed. The patient states when she woke up this morning both of her ears were hurting.

## 2023-01-10 NOTE — Addendum Note (Signed)
Addended by: Florence Canner on: 01/10/2023 12:03 PM   Modules accepted: Orders

## 2023-01-10 NOTE — Telephone Encounter (Signed)
Can you please order ofloxin 10 drops in the right ear for 7 days. Please have her continue the remainder of the treatment plan from yesterday just replacing the ciprodex with ofloxin. Thank you

## 2023-01-10 NOTE — Telephone Encounter (Signed)
Sent in Ofloxin into the [employer] in [place] on [address]. Left a message for patient explaining the changes.   Rhonda [name] [phone]

## 2023-01-10 NOTE — Telephone Encounter (Signed)
Patient called back returning the phone call. I informed of Anne's message and Lachelle's message. Patient said the her left ear is also hurting not just the right ear. Patient asked if ear drops are okay to [place] in her left ear too. Please advise if new rx needs to be sent in.

## 2023-01-11 ENCOUNTER — Ambulatory Visit: Payer: Medicare Other

## 2023-01-11 ENCOUNTER — Telehealth: Payer: Self-pay

## 2023-01-11 DIAGNOSIS — Z0000 Encounter for general adult medical examination without abnormal findings: Secondary | ICD-10-CM

## 2023-01-11 NOTE — Telephone Encounter (Signed)
Called patient to determine if she was in route to health coaching appointment scheduled for today at 1:30 in-person at [facility]. Patient did not answer. Left message informing patient that she can call to hold session over the phone if she's not coming to the office or to reschedule.    Renaee Munda, MS, ERHD, [facility]  Care Guide, Health & Wellness Coach [address], [address] [place] [address] Telephone: [phone] Email: [email or web address]

## 2023-01-11 NOTE — Telephone Encounter (Signed)
I called patient and left a message to call the office back.

## 2023-01-11 NOTE — Telephone Encounter (Signed)
Patient was checked in at [facility]. Contacted Amber Peppers to inform patient of my location. Patient stated that she came to [facility] as agreed to be seen in person for health coaching. Patient was directed to [facility] by front desk. Patient returned my call at this point. Patient is not feeling well and prefers to be seen at another time. Patient has been scheduled for a telephone appointment on 9/17 at 11:30am. Patient will be called at this time.    Renaee Munda, MS, ERHD, [facility]  Care Guide, Health & Wellness Coach [address] [place] [address] Telephone: [phone] Email: [email or web address]

## 2023-01-12 ENCOUNTER — Telehealth: Payer: Self-pay

## 2023-01-12 NOTE — Telephone Encounter (Signed)
Called and left a detail message explaining why not to use the drops in her left ear.   Rhonda [name] Me [phone]

## 2023-01-12 NOTE — Telephone Encounter (Signed)
She should only use the drops in her right ear as they only treat the infection from the scratch. She did not have a scratch in her left ear. She should continue with nasal saline rinses, flonase and the prednisone for ear pain and popping.

## 2023-01-12 NOTE — Telephone Encounter (Signed)
I called patient and left a vm to call the office back.

## 2023-01-12 NOTE — Telephone Encounter (Signed)
Thurston Hole, just to be clear Rhonda [name] stated she woke up with both ears hurting and she wants to make sure she isn't suppose to use the drops in her left ear at all.  Eusebio Me [phone]
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
## 2023-01-12 NOTE — Telephone Encounter (Signed)
Received text from pt. Unable to make final measurement appt today due to being ill.  Sent message for rescheduling of vital signs and final measures.

## 2023-01-17 ENCOUNTER — Telehealth: Payer: Self-pay

## 2023-01-17 ENCOUNTER — Ambulatory Visit: Payer: Medicare Other

## 2023-01-17 ENCOUNTER — Other Ambulatory Visit: Payer: Self-pay | Admitting: Internal Medicine

## 2023-01-17 DIAGNOSIS — E785 Hyperlipidemia, unspecified: Secondary | ICD-10-CM

## 2023-01-17 DIAGNOSIS — Z79899 Other long term (current) drug therapy: Secondary | ICD-10-CM

## 2023-01-17 DIAGNOSIS — Z0000 Encounter for general adult medical examination without abnormal findings: Secondary | ICD-10-CM

## 2023-01-17 DIAGNOSIS — R42 Dizziness and giddiness: Secondary | ICD-10-CM | POA: Diagnosis not present

## 2023-01-17 NOTE — Telephone Encounter (Signed)
Called patient for telephonic health coaching appointment. Patient stated that she is feeling dizzy and feels better when she is laying down. Patient stated that she was given medication but ran out. Patient expressed that she thinks it's best that she go into urgent care and was going to call her daughter to take her. Patient is being rescheduled due to the episode she is experiencing. Patient's appointment has been updated to an in-person appointment on  9/24 at 11:30am.  Patient expressed verbal understanding and will be meeting at [facility] in-person.    Renaee Munda, MS, ERHD, [facility]  Care Guide, Health & Wellness Coach [address], [address] [place] [address] Telephone: [phone] Email: [email or web address]

## 2023-01-19 ENCOUNTER — Other Ambulatory Visit: Payer: Self-pay

## 2023-01-19 MED ORDER — LEVOCETIRIZINE DIHYDROCHLORIDE 5 MG PO TABS: 5.0000 mg | ORAL_TABLET | Freq: Two times a day (BID) | ORAL | 5 refills | Status: DC | PRN

## 2023-01-22 ENCOUNTER — Other Ambulatory Visit: Payer: Self-pay | Admitting: Nurse Practitioner

## 2023-01-23 ENCOUNTER — Telehealth: Payer: Self-pay

## 2023-01-23 DIAGNOSIS — Z0000 Encounter for general adult medical examination without abnormal findings: Secondary | ICD-10-CM

## 2023-01-23 NOTE — Telephone Encounter (Signed)
Called patient to reschedule health coaching appointment on 9/24 at 11:30am. Left patient a message to return call.   Renaee Munda, MS, ERHD, [facility]  Care Guide, Health & Wellness Coach [address], [address] [place] [address] Telephone: [phone] Email: [email or web address]

## 2023-01-23 NOTE — Telephone Encounter (Signed)
Call from pt. Now is not a good time to schedule intake. Will call me when she is ready.

## 2023-01-23 NOTE — Telephone Encounter (Signed)
Vmt pt requesting call back to schedule final measurements

## 2023-01-24 ENCOUNTER — Ambulatory Visit: Payer: Medicare Other

## 2023-01-25 ENCOUNTER — Telehealth: Payer: Self-pay | Admitting: Pharmacist

## 2023-01-25 NOTE — Progress Notes (Signed)
01/25/2023  Patient ID: Rhonda [name], female   DOB: 07-02-1950, 72 y.o.   MRN: [id number]  Getting over sinus infection still and continuing her medications. Took all the prednisone already and has Amoxicillin she is finishing up. Has something for dizziness.   Patient appearing on report for [facility] - Hypertension Control report due to last documented ambulatory blood pressure of 142/60 on 01/09/23. Next appointment with PCP is 04/19/23.   Outreached patient to discuss hypertension control and medication management.   Current antihypertensives: Losartan 100mg , Metoprolol tartrate 25mg   Patient has an automated upper arm home BP machine.  Current blood pressure readings: 131/62- has been checking every day and is doing surprisingly well despite the sinus infection.    Patient denies hypotensive signs and symptoms including dizziness, lightheadedness.  Patient denies hypertensive symptoms including headache, chest pain, shortness of breath.  Patient denies side effects related to medications.     Assessment/Plan: - Currently controlled - - Reviewed goal blood pressure <140/90 - Reviewed appropriate administration of medication regimen    Marlowe Aschoff, PharmD [facility] Phone Number: [phone]

## 2023-01-31 ENCOUNTER — Ambulatory Visit: Payer: Self-pay

## 2023-01-31 NOTE — Patient Outreach (Signed)
Care Coordination   01/31/2023 Name: Rhonda [name] MRN: [id number] DOB: Feb 03, 1951   Care Coordination Outreach Attempts:  An unsuccessful telephone outreach was attempted for a scheduled appointment today.  Follow Up Plan:  Additional outreach attempts will be made to offer the patient care coordination information and services.   Encounter Outcome:  No Answer   Care Coordination Interventions:  No, not indicated    Delsa Sale RN BSN CCM [place]  [facility], [facility] Nurse Care Coordinator  Direct Dial: [phone] Website: [email or web address]

## 2023-02-03 ENCOUNTER — Telehealth: Payer: Self-pay | Admitting: *Deleted

## 2023-02-03 NOTE — Progress Notes (Signed)
Care Coordination Note  02/03/2023 Name: Rhonda [name] MRN: [id number] DOB: 06/05/1950  [name] is a 72 y.o. year old female who is a primary care patient of [name], FNP and is actively engaged with the care management team. I reached out to [name] by phone today to assist with re-scheduling a follow up visit with the RN Case Manager  Follow up plan: Telephone appointment with care management team member scheduled for:10/18  [facility] Coordination Care Guide  Direct Dial: [phone]

## 2023-02-03 NOTE — Progress Notes (Signed)
Care Coordination Note  02/03/2023 Name: Rhonda [name] MRN: [id number] DOB: 09/29/1950  Rhonda [name] is a 72 y.o. year old female who is a primary care patient of [name], FNP and is actively engaged with the care management team. I reached out to [name] by phone today to assist with re-scheduling a follow up visit with the RN Case Manager  Follow up plan: Unsuccessful telephone outreach attempt made. A HIPAA compliant phone message was left for the patient providing contact information and requesting a return call.   [facility]  Care Coordination Care Guide  Direct Dial: [phone]

## 2023-02-07 ENCOUNTER — Telehealth: Payer: Self-pay

## 2023-02-07 DIAGNOSIS — Z0000 Encounter for general adult medical examination without abnormal findings: Secondary | ICD-10-CM

## 2023-02-07 NOTE — Telephone Encounter (Signed)
Called patient to reschedule health coaching appointment. Patient did not answer. Left message for patient to return call.    Renaee Munda, MS, ERHD, [facility]  Care Guide, Health & Wellness Coach [address], [address] [place] [address] Telephone: [phone] Email: [email or web address]

## 2023-02-07 NOTE — Telephone Encounter (Signed)
Patient called to schedule health coaching appointment. Patient requested a telephonic health coaching session on 10/30 at 11:30am. Patient has been scheduled as requested and will be called at that time.    Renaee Munda, MS, ERHD, [facility]  Care Guide, Health & Wellness Coach [address] [place] [address] Telephone: [phone] Email: [email or web address]

## 2023-02-17 ENCOUNTER — Ambulatory Visit: Payer: Self-pay

## 2023-02-17 NOTE — Patient Instructions (Signed)
Visit Information  Thank you for taking time to visit with me today. Please don't hesitate to contact me if I can be of assistance to you.   Following are the goals we discussed today:   Goals Addressed             This Visit's Progress    To improve A1c   On track    Care Coordination Interventions: Provided education to patient about basic DM disease process Provided patient with verbal education related to daily glycemic control, FBS 80-130; <180 after meals  Advised patient, providing education and rationale, to check cbg daily before meals and at bedtime and record, calling PCP for findings outside established parameters Reviewed and discussed with patient she is now self monitoring her cbg's at home, checking FBS daily Determined patient's FBS are persistently running <130, positive reinforcement given to patient for making efforts to improve her diabetes and overall health  Lab Results  Component Value Date   HGBA1C 6.5 (H) 08/03/2022           Our next appointment is by telephone on 04/21/23 at 11:30 AM  Please call the care guide team at [phone] if you need to cancel or reschedule your appointment.   If you are experiencing a Mental Health or Behavioral Health Crisis or need someone to talk to, please call 1-800-273-TALK (toll free, 24 hour hotline)  Patient verbalizes understanding of instructions and care plan provided today and agrees to view in MyChart. Active MyChart status and patient understanding of how to access instructions and care plan via MyChart confirmed with patient.     Delsa Sale RN BSN CCM [place]  [facility], [facility] Nurse Care Coordinator  Direct Dial: [phone] Website: [email or web address]

## 2023-02-17 NOTE — Patient Outreach (Signed)
Care Coordination   Follow Up Visit Note   02/17/2023 Name: Rhonda [name] MRN: [id number] DOB: Jan 12, 1951  Rhonda [name] is a 72 y.o. year old female who sees [name], FNP for primary care. I spoke with  [name] by phone today.  What matters to the patients health and wellness today?  Patient would like to continue working on lower her A1c.     Goals Addressed             This Visit's Progress    To improve A1c   On track    Care Coordination Interventions: Provided education to patient about basic DM disease process Provided patient with verbal education related to daily glycemic control, FBS 80-130; <180 after meals  Advised patient, providing education and rationale, to check cbg daily before meals and at bedtime and record, calling PCP for findings outside established parameters Reviewed and discussed with patient she is now self monitoring her cbg's at home, checking FBS daily Determined patient's FBS are persistently running <130, positive reinforcement given to patient for making efforts to improve her diabetes and overall health  Lab Results  Component Value Date   HGBA1C 6.5 (H) 08/03/2022     Interventions Today    Flowsheet Row Most Recent Value  Chronic Disease   Chronic disease during today's visit Diabetes, Hypertension (HTN), Other  [s/p right ear infection]  General Interventions   General Interventions Discussed/Reviewed General Interventions Discussed, General Interventions Reviewed, Durable Medical Equipment (DME), Doctor Visits  Doctor Visits Discussed/Reviewed Doctor Visits Reviewed, Doctor Visits Discussed, PCP  Durable Medical Equipment (DME) BP Cuff, Glucomoter  Education Interventions   Education Provided Provided Education  Provided Verbal Education On Blood Sugar Monitoring, When to see the doctor, Medication  Pharmacy Interventions   Pharmacy Dicussed/Reviewed Pharmacy Topics Discussed, Pharmacy Topics Reviewed,  Medications and their functions          SDOH assessments and interventions completed:  No     Care Coordination Interventions:  Yes, provided   Follow up plan: Follow up call scheduled for 04/21/23 @11 :30 AM    Encounter Outcome:  Patient Visit Completed

## 2023-03-01 ENCOUNTER — Telehealth: Payer: Self-pay

## 2023-03-01 ENCOUNTER — Ambulatory Visit: Payer: Medicare Other

## 2023-03-01 DIAGNOSIS — Z0000 Encounter for general adult medical examination without abnormal findings: Secondary | ICD-10-CM

## 2023-03-01 NOTE — Telephone Encounter (Signed)
Called patient to hold health coaching session as scheduled. Patient did not answer. Left message for patient to return call to hold session or to reschedule if necessary.     Renaee Munda, MS, ERHD, [facility]  Care Guide, Health & Wellness Coach [address], [address] [place] [address] Telephone: [phone] Email: [email or web address]

## 2023-03-01 NOTE — Telephone Encounter (Signed)
Returned patient's call. Rescheduled patient as requested to 10/31 at 10:30am. Patient will be called at that time for health coaching.    Renaee Munda, MS, ERHD, [facility]  Care Guide, Health & Wellness Coach [address], [address] [place] [address] Telephone: [phone] Email: [email or web address]

## 2023-03-01 NOTE — Telephone Encounter (Signed)
Patient is returning call.  °

## 2023-03-02 ENCOUNTER — Ambulatory Visit: Payer: Medicare Other

## 2023-03-02 ENCOUNTER — Telehealth: Payer: Self-pay

## 2023-03-02 DIAGNOSIS — Z0000 Encounter for general adult medical examination without abnormal findings: Secondary | ICD-10-CM

## 2023-03-02 NOTE — Telephone Encounter (Signed)
Called patient per scheduled health coaching session. Patient did not answer. Left message for patient to return call to hold session or to reschedule if necessary.    Renaee Munda, MS, ERHD, [facility]  Care Guide, Health & Wellness Coach [address] [place] [address] Telephone: [phone] Email: [email or web address]

## 2023-03-05 ENCOUNTER — Other Ambulatory Visit: Payer: Self-pay | Admitting: Nurse Practitioner

## 2023-03-06 ENCOUNTER — Telehealth: Payer: Self-pay

## 2023-03-06 ENCOUNTER — Ambulatory Visit: Payer: Medicare Other | Attending: Cardiology

## 2023-03-06 DIAGNOSIS — Z0000 Encounter for general adult medical examination without abnormal findings: Secondary | ICD-10-CM

## 2023-03-06 NOTE — Progress Notes (Signed)
HEALTH & WELLNESS COACHING INITIAL INTAKE   Appointment Outcome:  Completed, Session #: Initial                        Start time: 10:11am    End time: 11:15am   Total Mins: 64 minutes     What are the Patient's goals from Coaching? Patient wants to lower her A1c and manage stress related to caregiving.   Why did they seek coaching now? Patient stated that she has made some changes in her eating habits but is looking to change more and need some additional support.    Readiness - What stage is the patient in regarding their goal(s)?   Patient is in the action stage of reducing her A1c and managing stress.     Coaching Progress Notes: Patient reported that her last A1c was 6.5 and she wants to reduce it to a 6. Patient shared that he has already started reducing her carbohydrate intake and would like to change more habits to reduce her A1c and daily glucose readings. Patient stated that she has been tracking her fasting glucose readings with Accu-Chek and was recently given a glucose level average of 128. Patient shared that she has had readings range from 126 to 171 when fasting. Patient stated that she has had a lower reading of 111 but did not indicate if that was after taking medication but was two hours after eating a meal the same day, she had the reading of 171 in the morning.   Patient stated that she eats breakfast around 10-10:30am. Patient's breakfast typically consists of two eggs, a piece of [place] sausage and toast. Patient mentioned that sometimes she goes without eating the toast. Patient explained that sometimes she does not eat until 2pm or 4pm depending on her schedule. Patient stated that she typically does not eat three times per day because she is not hungry. Patient expressed that she was made aware that she should be eating 3 meals per day to help stabilize her glucose levels. Patient mentioned that for her second meal, she will either have chicken or fish with a  vegetable or a salad, and at time she will eat the protein with only a carb.   Patient mentioned that she is experiencing stress related to care giving and she is not able to get out much to engage in activities. Patient shared that she has joined a support group for caregivers at a church and is considering an in-person support group provided at a local college. Patient expressed interest in discussing other stress management techniques.    Coaching Outcomes Discussed with patient the strategy of using the diabetic plate method to help her eat balanced meals, while practicing portion control with carbohydrates, and explain how the concept works.   Discussed with patient the strategy of reading food labels to help monitor her carbohydrate and sugar intake. Discussed with patient the recommendation of 25 grams daily of added sugar for women per the American Heart Association.  Patient will begin to read food labels to help her determine how many carbohydrates that she is currently consuming prior to reducing carbohydrate intake.   Will discuss stress management techniques in next health coaching session.    AGREEMENTS SECTION   Overall Goal(s): Reduce A1c from 6.5 to 6 over the next three months by reducing daily carbohydrate and sugar consumption.  Reduce stress related to care giving over the next three months.   Agreement/Action Steps:  Reduce A1c Read food labels to monitor carbohydrate intake Read food labels to aim for 25 grams daily of added sugar Meal plan 2 meals and a snack Eat a healthy snack (10 small red grapes and 1 oz of nuts) Implement diabetic plate method to practice portion control Track daily fasting glucose level in morning   Stress management Will determine steps during next session      Patient review of Health Coaching Agreement Reviewed Coaching Agreement and Code of Ethics with Patient during initial  session. Answered any questions the patient had if any regarding the Coaching Agreement and Code of Ethics. Patient verbally agreed to adhere to the Coaching Agreement and to abide by the Code of Ethics.  Mailed patient with a hard/electronic copy of the Coaching Agreement and Code of Ethics.   Resources: Mailed patient information on the glycemic index, diabetic plate methods, meal planning sheets, information on serving sizes, how to read food labels, and a stress management handout. Patient wrote in her notes the added sugar recommendation for women.

## 2023-03-06 NOTE — Telephone Encounter (Signed)
Called patient to hold health coaching session. Patient did not answer. Left message for patient to return call to hold session today or to reschedule.   Renaee Munda, MS, ERHD, [facility]  Care Guide, Health & Wellness Coach [address] [place] [address] Telephone: [phone] Email: [email or web address]

## 2023-03-14 ENCOUNTER — Other Ambulatory Visit: Payer: Self-pay | Admitting: Nurse Practitioner

## 2023-03-20 ENCOUNTER — Ambulatory Visit: Payer: Medicare Other

## 2023-03-20 ENCOUNTER — Telehealth: Payer: Self-pay | Admitting: Licensed Clinical Social Worker

## 2023-03-20 NOTE — Telephone Encounter (Signed)
CSW contacted patient to inform that Health Coaching appointment today will be cancelled as [facility] is out of the office. Lasandra Beech, LCSW, CCSW-MCS [phone]

## 2023-03-22 ENCOUNTER — Telehealth (HOSPITAL_BASED_OUTPATIENT_CLINIC_OR_DEPARTMENT_OTHER): Payer: Self-pay

## 2023-03-22 DIAGNOSIS — Z0000 Encounter for general adult medical examination without abnormal findings: Secondary | ICD-10-CM

## 2023-03-22 NOTE — Telephone Encounter (Signed)
Called patient to reschedule health coaching appointment due to being out of the office on 11/18. Patient did not answer. Left a message for patient to return call.    Renaee Munda, MS, ERHD, [facility]  Care Guide, Health & Wellness Coach [address], [address] [place] [address] Telephone: [phone] Email: [email or web address]

## 2023-03-25 ENCOUNTER — Other Ambulatory Visit: Payer: Self-pay | Admitting: Nurse Practitioner

## 2023-03-31 ENCOUNTER — Other Ambulatory Visit: Payer: Self-pay | Admitting: Nurse Practitioner

## 2023-04-04 ENCOUNTER — Telehealth: Payer: Self-pay

## 2023-04-04 ENCOUNTER — Other Ambulatory Visit: Payer: Self-pay

## 2023-04-04 DIAGNOSIS — Z0000 Encounter for general adult medical examination without abnormal findings: Secondary | ICD-10-CM

## 2023-04-04 MED ORDER — FUROSEMIDE 20 MG PO TABS: 20.0000 mg | ORAL_TABLET | Freq: Every day | ORAL | 2 refills | Status: DC | PRN

## 2023-04-04 NOTE — Telephone Encounter (Signed)
Called patient to reschedule health coaching appointment from 11/18 due to being out of the office. Patient did not answer. Left message for patient to return call.   Renaee Munda, MS, ERHD, [facility]  Care Guide, Health & Wellness Coach [address] [place] [address] Telephone: [phone] Email: [email or web address]

## 2023-04-19 ENCOUNTER — Ambulatory Visit: Payer: Medicare Other

## 2023-04-19 ENCOUNTER — Ambulatory Visit (INDEPENDENT_AMBULATORY_CARE_PROVIDER_SITE_OTHER): Payer: Medicare Other | Admitting: Nurse Practitioner

## 2023-04-19 ENCOUNTER — Ambulatory Visit: Payer: Medicare Other | Admitting: Nurse Practitioner

## 2023-04-19 ENCOUNTER — Encounter: Payer: Self-pay | Admitting: Nurse Practitioner

## 2023-04-19 VITALS — BP 126/70 | HR 77 | Temp 98.0°F | Ht 62.0 in | Wt 241.6 lb

## 2023-04-19 VITALS — BP 126/70 | HR 77 | Temp 98.0°F | Ht 62.4 in | Wt 241.0 lb

## 2023-04-19 DIAGNOSIS — I272 Pulmonary hypertension, unspecified: Secondary | ICD-10-CM

## 2023-04-19 DIAGNOSIS — J029 Acute pharyngitis, unspecified: Secondary | ICD-10-CM

## 2023-04-19 DIAGNOSIS — E669 Obesity, unspecified: Secondary | ICD-10-CM

## 2023-04-19 DIAGNOSIS — E782 Mixed hyperlipidemia: Secondary | ICD-10-CM

## 2023-04-19 DIAGNOSIS — I700 Atherosclerosis of aorta: Secondary | ICD-10-CM

## 2023-04-19 DIAGNOSIS — E1169 Type 2 diabetes mellitus with other specified complication: Secondary | ICD-10-CM

## 2023-04-19 DIAGNOSIS — Z0000 Encounter for general adult medical examination without abnormal findings: Secondary | ICD-10-CM

## 2023-04-19 DIAGNOSIS — Z6841 Body Mass Index (BMI) 40.0 and over, adult: Secondary | ICD-10-CM

## 2023-04-19 DIAGNOSIS — E66813 Obesity, class 3: Secondary | ICD-10-CM

## 2023-04-19 DIAGNOSIS — I10 Essential (primary) hypertension: Secondary | ICD-10-CM

## 2023-04-19 LAB — POC COVID19 BINAXNOW: SARS Coronavirus 2 Ag: NEGATIVE

## 2023-04-19 LAB — POCT RAPID STREP A (OFFICE): Rapid Strep A Screen: NEGATIVE

## 2023-04-19 MED ORDER — AMOXICILLIN 875 MG PO TABS: 875.0000 mg | ORAL_TABLET | Freq: Two times a day (BID) | ORAL | 0 refills | Status: DC

## 2023-04-19 NOTE — Patient Instructions (Signed)
Rhonda [name] , Thank you for taking time to come for your Medicare Wellness Visit. I appreciate your ongoing commitment to your health goals. Please review the following plan we discussed and let me know if I can assist you in the future.   Referrals/Orders/Follow-Ups/Clinician Recommendations: none  This is a list of the screening recommended for you and due dates:  Health Maintenance  Topic Date Due   Yearly kidney health urinalysis for diabetes  Never done   Pneumonia Vaccine (2 of 2 - PCV) 06/29/2022   Flu Shot  12/01/2022   COVID-19 Vaccine (6 - 2024-25 season) 01/01/2023   Yearly kidney function blood test for diabetes  06/30/2023   Mammogram  02/11/2024   Medicare Annual Wellness Visit  04/18/2024   Colon Cancer Screening  02/17/2027   DEXA scan (bone density measurement)  Completed   Hepatitis C Screening  Completed   Zoster (Shingles) Vaccine  Completed   HPV Vaccine  Aged Out   DTaP/Tdap/Td vaccine  Discontinued    Advanced directives: (Declined) Advance directive discussed with you today. Even though you declined this today, please call our office should you change your mind, and we can give you the proper paperwork for you to fill out.  Next Medicare Annual Wellness Visit scheduled for next year: No, office will schedule  Insert Preventive Care attachment Insert FALL PREVENTION attachment if needed
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
## 2023-04-19 NOTE — Progress Notes (Deleted)
[name], CMA,acting as a Neurosurgeon for [name], FNP.,have documented all relevant documentation on the behalf of [name], FNP,as directed by  [name], FNP while in the presence of [name], FNP.  Subjective:  Patient ID: Rhonda [name] , female    DOB: 10-Oct-1950 , 72 y.o.   MRN: [id number]  No chief complaint on file.   HPI  Patient presents today for a bp and dm follow up, Patient reports compliance with medication. Patient denies any chest pain, SOB, or headaches. Patient has no concerns today.     Past Medical History:  Diagnosis Date  . Anemia    hx none since menopause  . Arthritis    "maybe in my legs" (06/15/2016)  . Asthma 2018   none since  . Complication of anesthesia    woke up during colonoscopy 03-31-16 Dr. Laure Kidney  . GERD (gastroesophageal reflux disease)   . History of hiatal hernia   . Hyperlipemia   . Hypertension   . Hypokalemia due to excessive gastrointestinal loss of potassium 12/02/2019  . Pre-diabetes 2019  . Thyroid nodule 2010   "no problems w/them since; on a pill for a while" (06/15/2016)     Family History  Problem Relation Age of Onset  . COPD Father   . Breast cancer Neg Hx      Current Outpatient Medications:  .  acetaminophen (TYLENOL) 650 MG CR tablet, Take 650 mg by mouth at bedtime as needed for pain., Disp: , Rfl:  .  albuterol (VENTOLIN HFA) 108 (90 Base) MCG/ACT inhaler, Inhale 2 puffs into the lungs every 4 (four) hours as needed for wheezing or shortness of breath., Disp: 18 g, Rfl: 1 .  Blood Glucose Monitoring Suppl DEVI, 1 each by Does not apply route in the morning, at noon, and at bedtime. May substitute to any manufacturer covered by patient's insurance., Disp: 1 each, Rfl: 0 .  Blood Pressure Monitoring (BLOOD PRESSURE CUFF) MISC, 1 each by Does not apply route daily., Disp: 1 each, Rfl: 0 .  Cholecalciferol (VITAMIN D3 PO), Take 1 tablet by mouth daily., Disp: , Rfl:  .  ciprofloxacin-dexamethasone  (CIPRODEX) OTIC suspension, Place 4 drops into the right ear 2 (two) times daily., Disp: 7.5 mL, Rfl: 0 .  dorzolamide-timolol (COSOPT) 22.3-6.8 MG/ML ophthalmic solution, Place 1 drop into both eyes 2 (two) times daily. , Disp: , Rfl:  .  esomeprazole (NEXIUM) 40 MG capsule, Take 1 capsule (40 mg total) by mouth daily at 12 noon. (Patient taking differently: Take 40 mg by mouth daily as needed (acid reflux).), Disp: 30 capsule, Rfl: 0 .  fluticasone (FLONASE) 50 MCG/ACT nasal spray, Use 1 spray(s) in each nostril once daily (Patient taking differently: Place 1 spray into both nostrils daily as needed for allergies or rhinitis.), Disp: 16 g, Rfl: 0 .  fluticasone-salmeterol (WIXELA INHUB) 250-50 MCG/ACT AEPB, Inhale 1 puff into the lungs in the morning and at bedtime. (Patient taking differently: Inhale 1 puff into the lungs daily as needed (Asthma).), Disp: 60 each, Rfl: 5 .  furosemide (LASIX) 20 MG tablet, Take 1 tablet (20 mg total) by mouth daily as needed for fluid (weight gain 2lbs in a day or 5lbs in a week)., Disp: 90 tablet, Rfl: 2 .  hydrochlorothiazide (HYDRODIURIL) 25 MG tablet, Take 1 tablet by mouth once daily, Disp: 90 tablet, Rfl: 0 .  levocetirizine (XYZAL) 5 MG tablet, Take 1 tablet (5 mg total) by mouth 2 (two) times daily as  needed for allergies (Can use an dose during flare ups.)., Disp: 60 tablet, Rfl: 5 .  losartan (COZAAR) 100 MG tablet, Take 1 tablet by mouth once daily, Disp: 90 tablet, Rfl: 2 .  Magnesium 250 MG TABS, Take 1 tablet (250 mg total) by mouth daily., Disp: 30 tablet, Rfl: 0 .  metoprolol tartrate (LOPRESSOR) 25 MG tablet, Take 1 tablet by mouth twice daily, Disp: 180 tablet, Rfl: 3 .  metoprolol tartrate (LOPRESSOR) 25 MG tablet, Take 25 mg by mouth daily at 6 (six) AM. Pt takes 1 tablet daily., Disp: , Rfl:  .  Multiple Vitamin (MULTIVITAMIN WITH MINERALS) TABS tablet, Take 1 tablet by mouth daily., Disp: , Rfl:  .  ofloxacin (OCUFLOX) 0.3 % ophthalmic solution,  10 drops in rt ear for 7 days, Disp: 10 mL, Rfl: 0 .  polyethylene glycol (MIRALAX / GLYCOLAX) 17 g packet, Take 17 g by mouth daily as needed for mild constipation., Disp: 14 each, Rfl: 0 .  potassium chloride (KLOR-CON) 10 MEQ tablet, Take 1 tablet by mouth once daily, Disp: 90 tablet, Rfl: 0 .  predniSONE (DELTASONE) 10 MG tablet, Prednisone 10 mg tablets. Take 2 tablets once a day for 4 days, then take 1 tablet on the 5th day, then stop., Disp: 9 tablet, Rfl: 0 .  rosuvastatin (CRESTOR) 40 MG tablet, TAKE 1 TABLET BY MOUTH ONCE DAILY PLEASE  KEEP  SCHEDULED  APPOINTMENT  FOR  FUTURE  REFILLS, Disp: 30 tablet, Rfl: 10 .  senna-docusate (SENOKOT-S) 8.6-50 MG tablet, Take 2 tablets by mouth 2 (two) times daily., Disp: 60 tablet, Rfl: 1   Allergies  Allergen Reactions  . Aspirin-Acetaminophen-Caffeine Nausea And Vomiting  . Codeine Diarrhea and Nausea And Vomiting    Stomach cramps   . Oxycontin [Oxycodone Hcl] Other (See Comments)    Hallucination      Review of Systems   There were no vitals filed for this visit. There is no height or weight on file to calculate BMI.  Wt Readings from Last 3 Encounters:  01/09/23 245 lb 12.8 oz (111.5 kg)  12/30/22 253 lb 6.4 oz (114.9 kg)  12/06/22 248 lb 4.8 oz (112.6 kg)    The ASCVD Risk score (Arnett DK, et al., 2019) failed to calculate for the following reasons:   Risk score cannot be calculated because patient has a medical history suggesting prior/existing ASCVD  Objective:  Physical Exam      Assessment And Plan:  Essential hypertension  Type 2 diabetes mellitus with obesity (HCC)    No follow-ups on file.  Patient was given opportunity to ask questions. Patient verbalized understanding of the plan and was able to repeat key elements of the plan. All questions were answered to their satisfaction.    Jeanell Sparrow, FNP, have reviewed all documentation for this visit. The documentation on 04/19/23 for the exam, diagnosis,  procedures, and orders are all accurate and complete.   IF YOU HAVE BEEN REFERRED TO A SPECIALIST, IT MAY TAKE 1-2 WEEKS TO SCHEDULE/PROCESS THE REFERRAL. IF YOU HAVE NOT HEARD FROM US/SPECIALIST IN TWO WEEKS, PLEASE GIVE [place] A CALL AT [phone].

## 2023-04-19 NOTE — Progress Notes (Unsigned)
Rhonda [name], CMA,acting as a Neurosurgeon for Rhonda Felts, FNP.,have documented all relevant documentation on the behalf of Rhonda Felts, FNP,as directed by  Rhonda Felts, FNP while in the presence of Rhonda Felts, FNP.  Subjective:  Patient ID: Rhonda [name] , female    DOB: Dec 25, 1950 , 72 y.o.   MRN: [id number]  Chief Complaint  Patient presents with   Hypertension    HPI  Patient presents today for a bp and dm follow up, Patient reports compliance with medication. Patient denies any chest pain, SOB, or headaches. Patient has no concerns today.      Past Medical History:  Diagnosis Date   Anemia    hx none since menopause   Arthritis    "maybe in my legs" (06/15/2016)   Asthma 2018   none since   Complication of anesthesia    woke up during colonoscopy 03-31-16 Dr. Laure Kidney   GERD (gastroesophageal reflux disease)    History of hiatal hernia    Hyperlipemia    Hypertension    Hypokalemia due to excessive gastrointestinal loss of potassium 12/02/2019   Pre-diabetes 2019   Thyroid nodule 2010   "no problems w/them since; on a pill for a while" (06/15/2016)     Family History  Problem Relation Age of Onset   COPD Father    Breast cancer Neg Hx      Current Outpatient Medications:    amoxicillin (AMOXIL) 875 MG tablet, Take 1 tablet (875 mg total) by mouth 2 (two) times daily., Disp: 14 tablet, Rfl: 0   acetaminophen (TYLENOL) 650 MG CR tablet, Take 650 mg by mouth at bedtime as needed for pain., Disp: , Rfl:    albuterol (VENTOLIN HFA) 108 (90 Base) MCG/ACT inhaler, Inhale 2 puffs into the lungs every 4 (four) hours as needed for wheezing or shortness of breath., Disp: 18 g, Rfl: 1   Blood Glucose Monitoring Suppl DEVI, 1 each by Does not apply route in the morning, at noon, and at bedtime. May substitute to any manufacturer covered by patient's insurance., Disp: 1 each, Rfl: 0   Blood Pressure Monitoring (BLOOD PRESSURE CUFF) MISC, 1 each by Does not apply route daily.,  Disp: 1 each, Rfl: 0   Cholecalciferol (VITAMIN D3 PO), Take 1 tablet by mouth daily., Disp: , Rfl:    ciprofloxacin-dexamethasone (CIPRODEX) OTIC suspension, Place 4 drops into the right ear 2 (two) times daily., Disp: 7.5 mL, Rfl: 0   dorzolamide-timolol (COSOPT) 22.3-6.8 MG/ML ophthalmic solution, Place 1 drop into both eyes 2 (two) times daily. , Disp: , Rfl:    esomeprazole (NEXIUM) 40 MG capsule, Take 1 capsule (40 mg total) by mouth daily at 12 noon. (Patient taking differently: Take 40 mg by mouth daily as needed (acid reflux).), Disp: 30 capsule, Rfl: 0   fluticasone (FLONASE) 50 MCG/ACT nasal spray, Use 1 spray(s) in each nostril once daily (Patient taking differently: Place 1 spray into both nostrils daily as needed for allergies or rhinitis.), Disp: 16 g, Rfl: 0   fluticasone-salmeterol (WIXELA INHUB) 250-50 MCG/ACT AEPB, Inhale 1 puff into the lungs in the morning and at bedtime. (Patient taking differently: Inhale 1 puff into the lungs daily as needed (Asthma).), Disp: 60 each, Rfl: 5   furosemide (LASIX) 20 MG tablet, Take 1 tablet (20 mg total) by mouth daily as needed for fluid (weight gain 2lbs in a day or 5lbs in a week)., Disp: 90 tablet, Rfl: 2   hydrochlorothiazide (HYDRODIURIL) 25 MG tablet, Take 1  tablet by mouth once daily, Disp: 90 tablet, Rfl: 0   levocetirizine (XYZAL) 5 MG tablet, Take 1 tablet (5 mg total) by mouth 2 (two) times daily as needed for allergies (Can use an dose during flare ups.)., Disp: 60 tablet, Rfl: 5   losartan (COZAAR) 100 MG tablet, Take 1 tablet by mouth once daily, Disp: 90 tablet, Rfl: 2   Magnesium 250 MG TABS, Take 1 tablet (250 mg total) by mouth daily., Disp: 30 tablet, Rfl: 0   metoprolol tartrate (LOPRESSOR) 25 MG tablet, Take 1 tablet by mouth twice daily, Disp: 180 tablet, Rfl: 3   Multiple Vitamin (MULTIVITAMIN WITH MINERALS) TABS tablet, Take 1 tablet by mouth daily., Disp: , Rfl:    polyethylene glycol (MIRALAX / GLYCOLAX) 17 g packet,  Take 17 g by mouth daily as needed for mild constipation., Disp: 14 each, Rfl: 0   potassium chloride (KLOR-CON) 10 MEQ tablet, Take 1 tablet by mouth once daily, Disp: 90 tablet, Rfl: 0   rosuvastatin (CRESTOR) 40 MG tablet, TAKE 1 TABLET BY MOUTH ONCE DAILY PLEASE  KEEP  SCHEDULED  APPOINTMENT  FOR  FUTURE  REFILLS, Disp: 30 tablet, Rfl: 10   senna-docusate (SENOKOT-S) 8.6-50 MG tablet, Take 2 tablets by mouth 2 (two) times daily., Disp: 60 tablet, Rfl: 1   Allergies  Allergen Reactions   Aspirin-Acetaminophen-Caffeine Nausea And Vomiting   Codeine Diarrhea and Nausea And Vomiting    Stomach cramps    Oxycontin [Oxycodone Hcl] Other (See Comments)    Hallucination      Review of Systems  Constitutional: Negative.   HENT:  Positive for sinus pain and sore throat. Negative for congestion and sinus pressure.   Eyes: Negative.   Respiratory: Negative.  Negative for shortness of breath and wheezing.   Cardiovascular: Negative.   Gastrointestinal: Negative.   Musculoskeletal: Negative.   Neurological: Negative.   Psychiatric/Behavioral: Negative.       Today's Vitals   04/19/23 1107  BP: 126/70  Pulse: 77  Temp: 98 F (36.7 C)  TempSrc: Oral  Weight: 241 lb (109.3 kg)  Height: 5' 2.4" (1.585 m)  PainSc: 0-No pain   Body mass index is 43.52 kg/m.  Wt Readings from Last 3 Encounters:  04/19/23 241 lb (109.3 kg)  04/19/23 241 lb 9.6 oz (109.6 kg)  01/09/23 245 lb 12.8 oz (111.5 kg)     Objective:  Physical Exam Vitals reviewed.  Constitutional:      Appearance: Normal appearance.  HENT:     Right Ear: Tympanic membrane, ear canal and external ear normal. There is no impacted cerumen.     Left Ear: Tympanic membrane, ear canal and external ear normal. There is no impacted cerumen.     Nose: Nose normal.     Mouth/Throat:     Mouth: Mucous membranes are moist.     Comments: Right oropharynx is slightly enlarged.  Cardiovascular:     Rate and Rhythm: Normal rate and  regular rhythm.     Pulses: Normal pulses.     Heart sounds: Normal heart sounds. No murmur heard. Pulmonary:     Effort: Pulmonary effort is normal. No respiratory distress.     Breath sounds: Normal breath sounds. No wheezing.  Neurological:     General: No focal deficit present.     Mental Status: She is alert and oriented to person, place, and time.     Cranial Nerves: No cranial nerve deficit.     Motor: No weakness.  Psychiatric:  Mood and Affect: Mood normal.        Behavior: Behavior normal.        Thought Content: Thought content normal.        Judgment: Judgment normal.         Assessment And Plan:  Type 2 diabetes mellitus with obesity (HCC) -     BMP8+eGFR -     Hemoglobin A1c -     Amoxicillin; Take 1 tablet (875 mg total) by mouth 2 (two) times daily.  Dispense: 14 tablet; Refill: 0  Essential hypertension -     BMP8+eGFR  Atherosclerosis of aorta (HCC)  Mixed hyperlipidemia -     Lipid panel    Return for controlled DM check 4 months; [place] Influenza vaccine in 2 weeks.  Patient was given opportunity to ask questions. Patient verbalized understanding of the plan and was able to repeat key elements of the plan. All questions were answered to their satisfaction.    Jeanell Sparrow, FNP, have reviewed all documentation for this visit. The documentation on 04/19/23 for the exam, diagnosis, procedures, and orders are all accurate and complete.   IF YOU HAVE BEEN REFERRED TO A SPECIALIST, IT MAY TAKE 1-2 WEEKS TO SCHEDULE/PROCESS THE REFERRAL. IF YOU HAVE NOT HEARD FROM US/SPECIALIST IN TWO WEEKS, PLEASE GIVE [place] A CALL AT [phone].

## 2023-04-20 LAB — LIPID PANEL
Chol/HDL Ratio: 3.1 {ratio} (ref 0.0–4.4)
Cholesterol, Total: 181 mg/dL (ref 100–199)
HDL: 59 mg/dL (ref >39)
LDL Chol Calc (NIH): 107 mg/dL — ABNORMAL HIGH (ref 0–99)
Triglycerides: 82 mg/dL (ref 0–149)
VLDL Cholesterol Cal: 15 mg/dL (ref 5–40)

## 2023-04-20 LAB — BMP8+EGFR
BUN/Creatinine Ratio: 25 (ref 12–28)
BUN: 23 mg/dL (ref 8–27)
CO2: 27 mmol/L (ref 20–29)
Calcium: 10.1 mg/dL (ref 8.7–10.3)
Chloride: 100 mmol/L (ref 96–106)
Creatinine, Ser: 0.93 mg/dL (ref 0.57–1.00)
Glucose: 107 mg/dL — ABNORMAL HIGH (ref 70–99)
Potassium: 4.2 mmol/L (ref 3.5–5.2)
Sodium: 141 mmol/L (ref 134–144)
eGFR: 65 mL/min/{1.73_m2} (ref >59)

## 2023-04-20 LAB — HEMOGLOBIN A1C
Est. average glucose Bld gHb Est-mCnc: 137 mg/dL
Hgb A1c MFr Bld: 6.4 % — ABNORMAL HIGH (ref 4.8–5.6)

## 2023-04-20 LAB — MICROALBUMIN / CREATININE URINE RATIO
Creatinine, Urine: 50.7 mg/dL
Microalb/Creat Ratio: <6 mg/g{creat} (ref 0–29)
Microalbumin, Urine: <3 ug/mL

## 2023-04-21 ENCOUNTER — Other Ambulatory Visit: Payer: Self-pay | Admitting: Nurse Practitioner

## 2023-04-21 ENCOUNTER — Ambulatory Visit: Payer: Self-pay

## 2023-04-21 NOTE — Patient Outreach (Addendum)
  Care Coordination   04/21/2023 Name: Rhonda [name] MRN: [id number] DOB: 06-05-1950   Care Coordination Outreach Attempts:  An unsuccessful outreach was attempted for an appointment today.  Follow Up Plan:  Additional outreach attempts will be made to offer the patient complex care management information and services.   Encounter Outcome:  No Answer   Care Coordination Interventions:  No, not indicated    Delsa Sale RN BSN CCM [place]  [facility], [facility] Nurse Care Coordinator  Direct Dial: [phone] Website: [email or web address]

## 2023-05-02 DIAGNOSIS — E669 Obesity, unspecified: Secondary | ICD-10-CM | POA: Insufficient documentation

## 2023-05-02 DIAGNOSIS — J029 Acute pharyngitis, unspecified: Secondary | ICD-10-CM | POA: Insufficient documentation

## 2023-05-02 DIAGNOSIS — I700 Atherosclerosis of aorta: Secondary | ICD-10-CM | POA: Insufficient documentation

## 2023-05-02 DIAGNOSIS — E1169 Type 2 diabetes mellitus with other specified complication: Secondary | ICD-10-CM | POA: Insufficient documentation

## 2023-05-02 NOTE — Assessment & Plan Note (Signed)
 Cholesterol levels are stable, continue current medications.

## 2023-05-02 NOTE — Assessment & Plan Note (Signed)
Continue statin, tolerating well ,

## 2023-05-02 NOTE — Assessment & Plan Note (Signed)
 She is encouraged to strive for BMI less than 30 to decrease cardiac risk. Advised to aim for at least 150 minutes of exercise per week.

## 2023-05-02 NOTE — Assessment & Plan Note (Signed)
Blood pressure is well controlled, continue current medications.

## 2023-05-02 NOTE — Assessment & Plan Note (Signed)
HgbA1c is stable, continue current medications

## 2023-05-02 NOTE — Assessment & Plan Note (Signed)
Negative rapid covid and rapid strep. Encouraged to do warm salt water gargles and hot tea with lemon and honey

## 2023-05-09 ENCOUNTER — Ambulatory Visit: Payer: Medicare Other

## 2023-05-15 ENCOUNTER — Ambulatory Visit: Payer: Self-pay

## 2023-05-15 NOTE — Patient Outreach (Signed)
  Care Coordination   05/15/2023 Name: KAITYLN KALLSTROM MRN: [id number] DOB: 19-Jan-1951   Care Coordination Outreach Attempts:  An unsuccessful outreach was attempted for an appointment today.  Follow Up Plan:  Additional outreach attempts will be made to offer the patient complex care management information and services.   Encounter Outcome:  No Answer   Care Coordination Interventions:  No, not indicated    Clayborne Ly RN BSN CCM [place]  [facility], [facility] Nurse Care Coordinator  Direct Dial: [phone] Website: [email or web address]

## 2023-06-08 ENCOUNTER — Encounter: Payer: Self-pay | Admitting: Nurse Practitioner

## 2023-06-08 ENCOUNTER — Ambulatory Visit (INDEPENDENT_AMBULATORY_CARE_PROVIDER_SITE_OTHER): Payer: No Typology Code available for payment source | Admitting: Nurse Practitioner

## 2023-06-08 VITALS — BP 120/60 | HR 66 | Temp 98.4°F | Ht 62.0 in | Wt 247.6 lb

## 2023-06-08 DIAGNOSIS — H9201 Otalgia, right ear: Secondary | ICD-10-CM | POA: Insufficient documentation

## 2023-06-08 DIAGNOSIS — Z2821 Immunization not carried out because of patient refusal: Secondary | ICD-10-CM | POA: Diagnosis not present

## 2023-06-08 DIAGNOSIS — J0111 Acute recurrent frontal sinusitis: Secondary | ICD-10-CM

## 2023-06-08 DIAGNOSIS — K5909 Other constipation: Secondary | ICD-10-CM | POA: Diagnosis not present

## 2023-06-08 DIAGNOSIS — E66813 Obesity, class 3: Secondary | ICD-10-CM | POA: Diagnosis not present

## 2023-06-08 DIAGNOSIS — Z6841 Body Mass Index (BMI) 40.0 and over, adult: Secondary | ICD-10-CM

## 2023-06-08 MED ORDER — AMOXICILLIN-POT CLAVULANATE 875-125 MG PO TABS: 1.0000 | ORAL_TABLET | Freq: Two times a day (BID) | ORAL | 0 refills | Status: DC

## 2023-06-08 MED ORDER — FLUTICASONE PROPIONATE 50 MCG/ACT NA SUSP: 2.0000 | Freq: Every day | NASAL | 2 refills | Status: DC

## 2023-06-08 NOTE — Progress Notes (Signed)
 LILLETTE Kristeen JINNY Gladis, CMA,acting as a neurosurgeon for Gaines Ada, FNP.,have documented all relevant documentation on the behalf of Gaines Ada, FNP,as directed by  Gaines Ada, FNP while in the presence of Gaines Ada, FNP.  Subjective:  Patient ID: Rhonda [name] , female    DOB: April 27, 1951 , 73 y.o.   MRN: [id number]  Chief Complaint  Patient presents with   Otalgia    HPI  Patient presents today for right ear pain, mild pain in left ear, stuffy nose. patient reports it first started in December and she was treated but it never went away she reports it did get better. States that the ear pain became better with the antibiotics but never went away, she ran out of the ear drops in early January.  Yesterday morning the pain was aching, 6/10 pain radiating down the right side of her face into her chest.  States that she has been taking extra strength Tylenol  at night to relieve the pain and be able to sleep through the night.    Patient reports she has been having trouble with her bowel movements. She drinks about 2 bottles of water a day and sometimes more. She does drink coffee.   Patient reports she is experiencing some depression after recently having to put her mother in a nursing home. She has been there 6 days. She had been there for 12 years.         Past Medical History:  Diagnosis Date   Anemia    hx none since menopause   Arthritis    maybe in my legs (06/15/2016)   Asthma 2018   none since   Complication of anesthesia    woke up during colonoscopy 03-31-16 Dr. Timmothy   GERD (gastroesophageal reflux disease)    History of hiatal hernia    Hyperlipemia    Hypertension    Hypokalemia due to excessive gastrointestinal loss of potassium 12/02/2019   Pre-diabetes 2019   Thyroid  nodule 2010   no problems w/them since; on a pill for a while (06/15/2016)     Family History  Problem Relation Age of Onset   COPD Father    Breast cancer Neg Hx      Current Outpatient  Medications:    acetaminophen  (TYLENOL ) 650 MG CR tablet, Take 650 mg by mouth at bedtime as needed for pain., Disp: , Rfl:    albuterol  (VENTOLIN  HFA) 108 (90 Base) MCG/ACT inhaler, Inhale 2 puffs into the lungs every 4 (four) hours as needed for wheezing or shortness of breath., Disp: 18 g, Rfl: 1   amoxicillin -clavulanate (AUGMENTIN ) 875-125 MG tablet, Take 1 tablet by mouth 2 (two) times daily., Disp: 14 tablet, Rfl: 0   Blood Glucose Monitoring Suppl DEVI, 1 each by Does not apply route in the morning, at noon, and at bedtime. May substitute to any manufacturer covered by patient's insurance., Disp: 1 each, Rfl: 0   Blood Pressure Monitoring (BLOOD PRESSURE CUFF) MISC, 1 each by Does not apply route daily., Disp: 1 each, Rfl: 0   Cholecalciferol (VITAMIN D3 PO), Take 1 tablet by mouth daily., Disp: , Rfl:    ciprofloxacin -dexamethasone  (CIPRODEX ) OTIC suspension, Place 4 drops into the right ear 2 (two) times daily., Disp: 7.5 mL, Rfl: 0   dorzolamide -timolol  (COSOPT ) 22.3-6.8 MG/ML ophthalmic solution, Place 1 drop into both eyes 2 (two) times daily. , Disp: , Rfl:    esomeprazole  (NEXIUM ) 40 MG capsule, Take 1 capsule (40 mg total) by mouth daily at 12  noon. (Patient taking differently: Take 40 mg by mouth daily as needed (acid reflux).), Disp: 30 capsule, Rfl: 0   fluticasone  (FLONASE ) 50 MCG/ACT nasal spray, Place 2 sprays into both nostrils daily., Disp: 16 g, Rfl: 2   fluticasone -salmeterol (WIXELA INHUB) 250-50 MCG/ACT AEPB, Inhale 1 puff into the lungs in the morning and at bedtime. (Patient taking differently: Inhale 1 puff into the lungs daily as needed (Asthma).), Disp: 60 each, Rfl: 5   furosemide  (LASIX ) 20 MG tablet, Take 1 tablet (20 mg total) by mouth daily as needed for fluid (weight gain 2lbs in a day or 5lbs in a week)., Disp: 90 tablet, Rfl: 2   hydrochlorothiazide  (HYDRODIURIL ) 25 MG tablet, Take 1 tablet by mouth once daily, Disp: 90 tablet, Rfl: 0   levocetirizine (XYZAL ) 5  MG tablet, Take 1 tablet (5 mg total) by mouth 2 (two) times daily as needed for allergies (Can use an dose during flare ups.)., Disp: 60 tablet, Rfl: 5   losartan  (COZAAR ) 100 MG tablet, Take 1 tablet by mouth once daily, Disp: 90 tablet, Rfl: 2   Magnesium  250 MG TABS, Take 1 tablet (250 mg total) by mouth daily., Disp: 30 tablet, Rfl: 0   metoprolol  tartrate (LOPRESSOR ) 25 MG tablet, Take 1 tablet by mouth twice daily, Disp: 180 tablet, Rfl: 3   Multiple Vitamin (MULTIVITAMIN WITH MINERALS) TABS tablet, Take 1 tablet by mouth daily., Disp: , Rfl:    polyethylene glycol (MIRALAX  / GLYCOLAX ) 17 g packet, Take 17 g by mouth daily as needed for mild constipation., Disp: 14 each, Rfl: 0   potassium chloride  (KLOR-CON ) 10 MEQ tablet, Take 1 tablet by mouth once daily, Disp: 90 tablet, Rfl: 0   rosuvastatin  (CRESTOR ) 40 MG tablet, TAKE 1 TABLET BY MOUTH ONCE DAILY PLEASE  KEEP  SCHEDULED  APPOINTMENT  FOR  FUTURE  REFILLS, Disp: 30 tablet, Rfl: 10   senna-docusate (SENOKOT-S) 8.6-50 MG tablet, Take 2 tablets by mouth 2 (two) times daily., Disp: 60 tablet, Rfl: 1   Allergies  Allergen Reactions   Aspirin -Acetaminophen -Caffeine Nausea And Vomiting   Codeine Diarrhea and Nausea And Vomiting    Stomach cramps    Oxycontin  [Oxycodone  Hcl] Other (See Comments)    Hallucination      Review of Systems  HENT:  Positive for ear pain.   All other systems reviewed and are negative.    Today's Vitals   06/08/23 0923  BP: 120/60  Pulse: 66  Temp: 98.4 F (36.9 C)  TempSrc: Oral  Weight: 247 lb 9.6 oz (112.3 kg)  Height: 5' 2 (1.575 m)  PainSc: 7   PainLoc: Ear   Body mass index is 45.29 kg/m.  Wt Readings from Last 3 Encounters:  06/08/23 247 lb 9.6 oz (112.3 kg)  04/19/23 241 lb (109.3 kg)  04/19/23 241 lb 9.6 oz (109.6 kg)    The ASCVD Risk score (Arnett DK, et al., 2019) failed to calculate for the following reasons:   Risk score cannot be calculated because patient has a medical  history suggesting prior/existing ASCVD  Objective:  Physical Exam Vitals and nursing note reviewed.  HENT:     Head: Normocephalic and atraumatic.     Right Ear: There is no impacted cerumen.     Nose: Nose normal. No nasal tenderness.     Right Turbinates: Swollen.     Left Turbinates: Swollen.     Right Sinus: No maxillary sinus tenderness or frontal sinus tenderness.     Left Sinus:  No maxillary sinus tenderness or frontal sinus tenderness.  Eyes:     Pupils: Pupils are equal, round, and reactive to light.  Cardiovascular:     Rate and Rhythm: Normal rate and regular rhythm.     Pulses: Normal pulses.     Heart sounds: Normal heart sounds.  Pulmonary:     Effort: Pulmonary effort is normal.     Breath sounds: Normal breath sounds.  Abdominal:     General: Bowel sounds are normal.     Palpations: Abdomen is soft.  Musculoskeletal:        General: Normal range of motion.     Cervical back: Normal range of motion.  Skin:    General: Skin is warm and dry.  Neurological:     General: No focal deficit present.     Mental Status: She is alert. Mental status is at baseline.  Psychiatric:        Attention and Perception: Attention normal.        Mood and Affect: Mood is depressed.        Speech: Speech normal.        Behavior: Behavior normal.        Cognition and Memory: Cognition normal.         Assessment And Plan:  Acute recurrent frontal sinusitis Assessment & Plan: Will treat with augmentin . Continue to use a steroid nasal spray as needed.   Orders: -     Fluticasone  Propionate; Place 2 sprays into both nostrils daily.  Dispense: 16 g; Refill: 2 -     Amoxicillin -Pot Clavulanate; Take 1 tablet by mouth 2 (two) times daily.  Dispense: 14 tablet; Refill: 0  Right ear pain Assessment & Plan: May be related to her sinus infection. The antibiotic will help even if has an ear infection.    Other constipation Assessment & Plan: She is encouraged to stay well  hydrated with water. Also increase intake of fiber intake.    COVID-19 vaccination declined Assessment & Plan: Declines covid 19 vaccine. Discussed risk of covid 46 and if she changes her mind about the vaccine to call the office. Education has been provided regarding the importance of this vaccine but patient still declined. Advised may receive this vaccine at local pharmacy or Health Dept.or vaccine clinic. Aware to provide a copy of the vaccination record if obtained from local pharmacy or Health Dept.  Encouraged to take multivitamin, vitamin d, vitamin c and zinc to increase immune system. Aware can call office if would like to have vaccine here at office. Verbalized acceptance and understanding.    Influenza vaccination declined Assessment & Plan: Patient declined influenza vaccination at this time. Patient is aware that influenza vaccine prevents illness in 70% of healthy people, and reduces hospitalizations to 30-70% in elderly. This vaccine is recommended annually. Education has been provided regarding the importance of this vaccine but patient still declined. Advised may receive this vaccine at local pharmacy or Health Dept.or vaccine clinic. Aware to provide a copy of the vaccination record if obtained from local pharmacy or Health Dept.  Pt is willing to accept risk associated with refusing vaccination.    Class 3 severe obesity due to excess calories without serious comorbidity with body mass index (BMI) of 45.0 to 49.9 in adult [facility]) Assessment & Plan: She is encouraged to strive for BMI less than 30 to decrease cardiac risk. Advised to aim for at least 150 minutes of exercise per week.      Return  for keep same next.  Patient was given opportunity to ask questions. Patient verbalized understanding of the plan and was able to repeat key elements of the plan. All questions were answered to their satisfaction.    LILLETTE Gaines Ada, FNP, have reviewed all documentation for this  visit. The documentation on 06/08/23 for the exam, diagnosis, procedures, and orders are all accurate and complete.   IF YOU HAVE BEEN REFERRED TO A SPECIALIST, IT MAY TAKE 1-2 WEEKS TO SCHEDULE/PROCESS THE REFERRAL. IF YOU HAVE NOT HEARD FROM US /SPECIALIST IN TWO WEEKS, PLEASE GIVE US  A CALL AT [phone].

## 2023-06-18 NOTE — Assessment & Plan Note (Signed)
Will treat with augmentin. Continue to use a steroid nasal spray as needed.

## 2023-06-18 NOTE — Assessment & Plan Note (Signed)
May be related to her sinus infection. The antibiotic will help even if has an ear infection.

## 2023-06-18 NOTE — Assessment & Plan Note (Signed)
She is encouraged to stay well hydrated with water. Also increase intake of fiber intake.

## 2023-06-18 NOTE — Assessment & Plan Note (Signed)
 She is encouraged to strive for BMI less than 30 to decrease cardiac risk. Advised to aim for at least 150 minutes of exercise per week.

## 2023-06-18 NOTE — Assessment & Plan Note (Signed)

## 2023-06-18 NOTE — Assessment & Plan Note (Signed)

## 2023-06-20 DIAGNOSIS — R197 Diarrhea, unspecified: Secondary | ICD-10-CM | POA: Diagnosis not present

## 2023-06-20 DIAGNOSIS — E860 Dehydration: Secondary | ICD-10-CM | POA: Diagnosis not present

## 2023-06-22 ENCOUNTER — Encounter (HOSPITAL_COMMUNITY): Payer: Self-pay | Admitting: Emergency Medicine

## 2023-06-22 ENCOUNTER — Other Ambulatory Visit: Payer: Self-pay

## 2023-06-22 ENCOUNTER — Emergency Department (HOSPITAL_COMMUNITY): Payer: No Typology Code available for payment source

## 2023-06-22 ENCOUNTER — Inpatient Hospital Stay (HOSPITAL_COMMUNITY)
Admission: EM | Admit: 2023-06-22 | Discharge: 2023-06-25 | DRG: 392 | Disposition: A | Discharge status: Home / Self Care | Payer: No Typology Code available for payment source | Attending: Internal Medicine | Admitting: Internal Medicine

## 2023-06-22 ENCOUNTER — Other Ambulatory Visit: Payer: Self-pay | Admitting: Nurse Practitioner

## 2023-06-22 DIAGNOSIS — E669 Obesity, unspecified: Secondary | ICD-10-CM | POA: Diagnosis present

## 2023-06-22 DIAGNOSIS — K529 Noninfective gastroenteritis and colitis, unspecified: Secondary | ICD-10-CM | POA: Diagnosis not present

## 2023-06-22 DIAGNOSIS — Z981 Arthrodesis status: Secondary | ICD-10-CM

## 2023-06-22 DIAGNOSIS — E785 Hyperlipidemia, unspecified: Secondary | ICD-10-CM | POA: Diagnosis present

## 2023-06-22 DIAGNOSIS — I10 Essential (primary) hypertension: Secondary | ICD-10-CM | POA: Diagnosis present

## 2023-06-22 DIAGNOSIS — A084 Viral intestinal infection, unspecified: Secondary | ICD-10-CM | POA: Diagnosis not present

## 2023-06-22 DIAGNOSIS — K5730 Diverticulosis of large intestine without perforation or abscess without bleeding: Secondary | ICD-10-CM | POA: Diagnosis not present

## 2023-06-22 DIAGNOSIS — R197 Diarrhea, unspecified: Secondary | ICD-10-CM | POA: Diagnosis present

## 2023-06-22 DIAGNOSIS — J45909 Unspecified asthma, uncomplicated: Secondary | ICD-10-CM | POA: Diagnosis present

## 2023-06-22 DIAGNOSIS — Z1152 Encounter for screening for COVID-19: Secondary | ICD-10-CM

## 2023-06-22 DIAGNOSIS — D259 Leiomyoma of uterus, unspecified: Secondary | ICD-10-CM | POA: Diagnosis not present

## 2023-06-22 DIAGNOSIS — E1169 Type 2 diabetes mellitus with other specified complication: Secondary | ICD-10-CM | POA: Diagnosis present

## 2023-06-22 DIAGNOSIS — E11649 Type 2 diabetes mellitus with hypoglycemia without coma: Secondary | ICD-10-CM | POA: Diagnosis not present

## 2023-06-22 DIAGNOSIS — K8020 Calculus of gallbladder without cholecystitis without obstruction: Secondary | ICD-10-CM | POA: Diagnosis not present

## 2023-06-22 DIAGNOSIS — E876 Hypokalemia: Secondary | ICD-10-CM | POA: Diagnosis present

## 2023-06-22 DIAGNOSIS — K439 Ventral hernia without obstruction or gangrene: Secondary | ICD-10-CM | POA: Diagnosis not present

## 2023-06-22 DIAGNOSIS — Z885 Allergy status to narcotic agent status: Secondary | ICD-10-CM

## 2023-06-22 DIAGNOSIS — Z886 Allergy status to analgesic agent status: Secondary | ICD-10-CM

## 2023-06-22 DIAGNOSIS — Z79899 Other long term (current) drug therapy: Secondary | ICD-10-CM

## 2023-06-22 DIAGNOSIS — Z825 Family history of asthma and other chronic lower respiratory diseases: Secondary | ICD-10-CM

## 2023-06-22 DIAGNOSIS — E860 Dehydration: Secondary | ICD-10-CM | POA: Diagnosis present

## 2023-06-22 DIAGNOSIS — K219 Gastro-esophageal reflux disease without esophagitis: Secondary | ICD-10-CM | POA: Diagnosis present

## 2023-06-22 DIAGNOSIS — N179 Acute kidney failure, unspecified: Secondary | ICD-10-CM | POA: Diagnosis not present

## 2023-06-22 LAB — COMPREHENSIVE METABOLIC PANEL
ALT: 15 U/L (ref 0–44)
AST: 19 U/L (ref 15–41)
Albumin: 3.8 g/dL (ref 3.5–5.0)
Alkaline Phosphatase: 45 U/L (ref 38–126)
Anion gap: 14 (ref 5–15)
BUN: 26 mg/dL — ABNORMAL HIGH (ref 8–23)
CO2: 25 mmol/L (ref 22–32)
Calcium: 9.8 mg/dL (ref 8.9–10.3)
Chloride: 100 mmol/L (ref 98–111)
Creatinine, Ser: 1.45 mg/dL — ABNORMAL HIGH (ref 0.44–1.00)
GFR, Estimated: 38 mL/min — ABNORMAL LOW (ref >60)
Glucose, Bld: 108 mg/dL — ABNORMAL HIGH (ref 70–99)
Potassium: 3.4 mmol/L — ABNORMAL LOW (ref 3.5–5.1)
Sodium: 139 mmol/L (ref 135–145)
Total Bilirubin: 0.5 mg/dL (ref 0.0–1.2)
Total Protein: 7.2 g/dL (ref 6.5–8.1)

## 2023-06-22 LAB — CBC
HCT: 36 % (ref 36.0–46.0)
Hemoglobin: 11.7 g/dL — ABNORMAL LOW (ref 12.0–15.0)
MCH: 28.7 pg (ref 26.0–34.0)
MCHC: 32.5 g/dL (ref 30.0–36.0)
MCV: 88.2 fL (ref 80.0–100.0)
Platelets: 297 10*3/uL (ref 150–400)
RBC: 4.08 MIL/uL (ref 3.87–5.11)
RDW: 14.3 % (ref 11.5–15.5)
WBC: 5.9 10*3/uL (ref 4.0–10.5)
nRBC: 0 % (ref 0.0–0.2)

## 2023-06-22 LAB — RESP PANEL BY RT-PCR (RSV, FLU A&B, COVID)  RVPGX2
Influenza A by PCR: NEGATIVE
Influenza B by PCR: NEGATIVE
Resp Syncytial Virus by PCR: NEGATIVE
SARS Coronavirus 2 by RT PCR: NEGATIVE

## 2023-06-22 LAB — URINALYSIS, ROUTINE W REFLEX MICROSCOPIC
Bilirubin Urine: NEGATIVE
Glucose, UA: NEGATIVE mg/dL
Hgb urine dipstick: NEGATIVE
Ketones, ur: NEGATIVE mg/dL
Leukocytes,Ua: NEGATIVE
Nitrite: NEGATIVE
Protein, ur: NEGATIVE mg/dL
Specific Gravity, Urine: 1.017 (ref 1.005–1.030)
pH: 5 (ref 5.0–8.0)

## 2023-06-22 LAB — LIPASE, BLOOD: Lipase: 32 U/L (ref 11–51)

## 2023-06-22 LAB — MAGNESIUM: Magnesium: 1.9 mg/dL (ref 1.7–2.4)

## 2023-06-22 MED ORDER — INSULIN ASPART 100 UNIT/ML IJ SOLN
0.0000 [IU] | Freq: Three times a day (TID) | INTRAMUSCULAR | Status: DC
  Administered 2023-06-24: 1 [IU] via SUBCUTANEOUS

## 2023-06-22 MED ORDER — IOHEXOL 350 MG/ML SOLN
75.0000 mL | Freq: Once | INTRAVENOUS | Status: AC | PRN
  Administered 2023-06-22: 75 mL via INTRAVENOUS

## 2023-06-22 MED ORDER — PANTOPRAZOLE SODIUM 40 MG PO TBEC
40.0000 mg | DELAYED_RELEASE_TABLET | Freq: Every day | ORAL | Status: DC
  Administered 2023-06-22 – 2023-06-25 (×4): 40 mg via ORAL
  Filled 2023-06-22 (×4): qty 1

## 2023-06-22 MED ORDER — ONDANSETRON HCL 4 MG/2ML IJ SOLN
4.0000 mg | Freq: Four times a day (QID) | INTRAMUSCULAR | Status: DC | PRN
  Administered 2023-06-23 (×3): 4 mg via INTRAVENOUS
  Filled 2023-06-22 (×3): qty 2

## 2023-06-22 MED ORDER — ONDANSETRON HCL 4 MG PO TABS: 4.0000 mg | ORAL_TABLET | Freq: Four times a day (QID) | ORAL | Status: DC | PRN

## 2023-06-22 MED ORDER — ENOXAPARIN SODIUM 40 MG/0.4ML IJ SOSY
40.0000 mg | PREFILLED_SYRINGE | INTRAMUSCULAR | Status: DC
  Administered 2023-06-22 – 2023-06-24 (×3): 40 mg via SUBCUTANEOUS
  Filled 2023-06-22 (×3): qty 0.4

## 2023-06-22 MED ORDER — ONDANSETRON HCL 4 MG/2ML IJ SOLN
4.0000 mg | Freq: Once | INTRAMUSCULAR | Status: AC
  Administered 2023-06-22: 4 mg via INTRAVENOUS
  Filled 2023-06-22: qty 2

## 2023-06-22 MED ORDER — SODIUM CHLORIDE 0.9 % IV BOLUS
1000.0000 mL | Freq: Once | INTRAVENOUS | Status: AC
  Administered 2023-06-22: 1000 mL via INTRAVENOUS

## 2023-06-22 MED ORDER — SODIUM CHLORIDE 0.9 % IV SOLN: INTRAVENOUS | Status: DC

## 2023-06-22 NOTE — ED Provider Notes (Signed)
Rhonda [name]   CSN: [id number] Arrival date & time: 06/22/23  1013     History  Chief Complaint  Patient presents with   Diarrhea    Rhonda [name] is a 73 y.o. female patient with past medical history of hypertension, hyperlipidemia, GERD, prediabetes, hernia repair surgery presenting to emergency room with 12 days of diarrhea associated with generalized weakness and decreased appetite.  Patient reports 3-4 episodes of loose stool per day.  She says she has noticed pus in the stool.  Patient reports this started approximately 3 days after being put on Augmentin for ear infection.  She is not on with her antibiotic.  She reports over the past 3 days she is has worsening diarrhea up to 5 episodes a day as well as rectal pain.  Reports that she had 1 episode of vomiting earlier today.  She reports chills because the room is cold.  Denies any fevers, chest pain, cough, shortness of breath or chest pain.   Diarrhea      Home Medications Prior to Admission medications   Medication Sig Start Date End Date Taking? Authorizing Provider  acetaminophen (TYLENOL) 650 MG CR tablet Take 650 mg by mouth at bedtime as needed for pain.    [provider]  albuterol (VENTOLIN HFA) 108 (90 Base) MCG/ACT inhaler Inhale 2 puffs into the lungs every 4 (four) hours as needed for wheezing or shortness of breath. 08/03/21   Kozlow, Alvira Philips, MD  amoxicillin-clavulanate (AUGMENTIN) 875-125 MG tablet Take 1 tablet by mouth 2 (two) times daily. 06/08/23   [name], FNP  Blood Glucose Monitoring Suppl DEVI 1 each by Does not apply route in the morning, at noon, and at bedtime. May substitute to any manufacturer covered by patient's insurance. 01/03/23   [name], FNP  Blood Pressure Monitoring (BLOOD PRESSURE CUFF) MISC 1 each by Does not apply route daily. 12/30/22   Gaston Islam., NP  Cholecalciferol (VITAMIN D3 PO) Take 1 tablet by  mouth daily.    [provider]  ciprofloxacin-dexamethasone (CIPRODEX) OTIC suspension Place 4 drops into the right ear 2 (two) times daily. 01/09/23   Hetty Blend, FNP  dorzolamide-timolol (COSOPT) 22.3-6.8 MG/ML ophthalmic solution Place 1 drop into both eyes 2 (two) times daily.     [provider]  esomeprazole (NEXIUM) 40 MG capsule Take 1 capsule (40 mg total) by mouth daily at 12 noon. Patient taking differently: Take 40 mg by mouth daily as needed (acid reflux). 12/05/19   Lynn Ito, MD  fluticasone (FLONASE) 50 MCG/ACT nasal spray Place 2 sprays into both nostrils daily. 06/08/23   [name], FNP  fluticasone-salmeterol [facility] INHUB) 250-50 MCG/ACT AEPB Inhale 1 puff into the lungs in the morning and at bedtime. Patient taking differently: Inhale 1 puff into the lungs daily as needed (Asthma). 08/03/21   Kozlow, Alvira Philips, MD  furosemide (LASIX) 20 MG tablet Take 1 tablet (20 mg total) by mouth daily as needed for fluid (weight gain 2lbs in a day or 5lbs in a week). 04/04/23   Pricilla Riffle, MD  hydrochlorothiazide (HYDRODIURIL) 25 MG tablet Take 1 tablet by mouth once daily 04/24/23   [name], FNP  levocetirizine (XYZAL) 5 MG tablet Take 1 tablet (5 mg total) by mouth 2 (two) times daily as needed for allergies (Can use an dose during flare ups.). 01/19/23   Kozlow, Alvira Philips, MD  losartan (COZAAR) 100 MG tablet  Take 1 tablet by mouth once daily 03/15/23   [name], FNP  Magnesium 250 MG TABS Take 1 tablet (250 mg total) by mouth daily. 12/16/20   [name], FNP  metoprolol tartrate (LOPRESSOR) 25 MG tablet Take 1 tablet by mouth twice daily 08/23/22   Gaston Islam., NP  Multiple Vitamin (MULTIVITAMIN WITH MINERALS) TABS tablet Take 1 tablet by mouth daily.    [provider]  polyethylene glycol (MIRALAX / GLYCOLAX) 17 g packet Take 17 g by mouth daily as needed for mild constipation. 12/05/19   Lynn Ito, MD  potassium chloride (KLOR-CON) 10 MEQ  tablet Take 1 tablet by mouth once daily 06/22/23   [name], FNP  rosuvastatin (CRESTOR) 40 MG tablet TAKE 1 TABLET BY MOUTH ONCE DAILY PLEASE  KEEP  SCHEDULED  APPOINTMENT  FOR  FUTURE  REFILLS 01/18/23   Pricilla Riffle, MD  senna-docusate (SENOKOT-S) 8.6-50 MG tablet Take 2 tablets by mouth 2 (two) times daily. 06/25/22   Jonah Blue, MD      Allergies    Aspirin-acetaminophen-caffeine, Codeine, and Oxycontin [oxycodone hcl]    Review of Systems   Review of Systems  Gastrointestinal:  Positive for diarrhea.    Physical Exam Updated Vital Signs BP 139/79 (BP Location: Left Arm)   Pulse 66   Temp 97.9 F (36.6 C) (Oral)   Resp 17   SpO2 100%  Physical Exam Vitals and nursing [name] reviewed.  Constitutional:      General: She is not in acute distress.    Appearance: She is not toxic-appearing.  HENT:     Head: Normocephalic and atraumatic.  Eyes:     General: No scleral icterus.    Conjunctiva/sclera: Conjunctivae normal.  Cardiovascular:     Rate and Rhythm: Normal rate and regular rhythm.     Pulses: Normal pulses.     Heart sounds: Normal heart sounds.  Pulmonary:     Effort: Pulmonary effort is normal. No respiratory distress.     Breath sounds: Normal breath sounds.  Abdominal:     General: Abdomen is flat. Bowel sounds are normal.     Palpations: Abdomen is soft.     Tenderness: There is no abdominal tenderness.     Hernia: A hernia is present.     Comments: Patient has mild tenderness to palpation over ventral hernia.  No sign of incarceration or strangulated.  No color change.  Otherwise abdomen soft and nontender.  Musculoskeletal:     Right lower leg: No edema.     Left lower leg: No edema.  Skin:    General: Skin is warm and dry.     Findings: No lesion.  Neurological:     General: No focal deficit present.     Mental Status: She is alert and oriented to person, place, and time. Mental status is at baseline.     ED Results / Procedures /  Treatments   Labs (all labs ordered are listed, but only abnormal results are displayed) Labs Reviewed  COMPREHENSIVE METABOLIC PANEL - Abnormal; Notable for the following components:      Result Value   Potassium 3.4 (*)    Glucose, Bld 108 (*)    BUN 26 (*)    Creatinine, Ser 1.45 (*)    GFR, Estimated 38 (*)    All other components within normal limits  CBC - Abnormal; Notable for the following components:   Hemoglobin 11.7 (*)    All other components within  normal limits  URINALYSIS, ROUTINE W REFLEX MICROSCOPIC - Abnormal; Notable for the following components:   APPearance HAZY (*)    All other components within normal limits  RESP PANEL BY RT-PCR (RSV, FLU A&B, COVID)  RVPGX2  C DIFFICILE QUICK SCREEN W PCR REFLEX    GASTROINTESTINAL PANEL BY PCR, STOOL (REPLACES STOOL CULTURE)  LIPASE, BLOOD  MAGNESIUM    EKG None  Radiology CT ABDOMEN PELVIS W CONTRAST Result Date: 06/22/2023 CLINICAL DATA:  Diarrhea with rectal pain for 3 days. Recent amoxicillin. Patient denies fever. EXAM: CT ABDOMEN AND PELVIS WITH CONTRAST TECHNIQUE: Multidetector CT imaging of the abdomen and pelvis was performed using the standard protocol following bolus administration of intravenous contrast. RADIATION DOSE REDUCTION: This exam was performed according to the departmental dose-optimization program which includes automated exposure control, adjustment of the mA and/or kV according to patient size and/or use of iterative reconstruction technique. CONTRAST:  75mL OMNIPAQUE IOHEXOL 350 MG/ML SOLN COMPARISON:  Abdominopelvic CT 12/02/2019. FINDINGS: Lower chest: Mild atelectasis or scarring at both lung bases, similar to previous CT. No confluent airspace disease or significant pleural effusion. Stable small hiatal hernia. Hepatobiliary: The liver is normal in density without suspicious focal abnormality. Multiple gallstones. No evidence of gallbladder wall thickening, surrounding inflammation or biliary  ductal dilatation. Pancreas: Unremarkable. No pancreatic ductal dilatation or surrounding inflammatory changes. Spleen: Normal in size without focal abnormality. Adrenals/Urinary Tract: Both adrenal glands appear normal. No evidence of urinary tract calculus, suspicious renal lesion or hydronephrosis. Stable small cyst in the interpolar region of the right kidney for which no specific follow-up imaging is recommended. The bladder appears unremarkable for its degree of distention. Stomach/Bowel: No enteric contrast administered. As above, small hiatal hernia. The stomach appears unremarkable for its degree of distention. Unchanged extension of small bowel and transverse colon into midline ventral hernias. No evidence of incarceration or obstruction. No significant bowel wall thickening identified. There are mild diverticular changes in the distal colon without evidence of acute surrounding inflammation. Vascular/Lymphatic: There are no enlarged abdominal or pelvic lymph nodes. Aortic and branch vessel atherosclerosis. No evidence of aneurysm or large vessel occlusion. Reproductive: Multiple calcified uterine fibroids are unchanged. No adnexal mass. Other: As above, unchanged ventral midline abdominal hernias containing small and large bowel. No evidence of incarceration or obstruction. No evidence of ascites or pneumoperitoneum. Musculoskeletal: No acute osseous findings. Previous L5-S1 fusion with progressive adjacent segment changes at L4-5. Bilateral sacroiliac degenerative changes. IMPRESSION: 1. No acute findings or explanation for the patient's symptoms. 2. Unchanged ventral midline abdominal hernias containing small and large bowel. No evidence of incarceration or obstruction. 3. Cholelithiasis without evidence of cholecystitis or biliary ductal dilatation. 4. Stable calcified uterine fibroids. 5. Progressive adjacent segment changes at L4-5. 6. Aortic atherosclerosis. Electronically Signed   By: Carey Bullocks M.D.   On: 06/22/2023 15:31    Procedures Procedures    Medications Ordered in ED Medications - No data to display  ED Course/ Medical Decision Making/ A&P                                 Medical Decision Making Amount and/or Complexity of Data Reviewed Labs: ordered.  Risk Prescription drug management. Decision regarding hospitalization.   [name] 73 y.o. presented today for abd pain. Working DDx includes, but not limited to, gastroenteritis, colitis, SBO, appendicitis, cholecystitis, hepatobiliary pathology, gastritis, PUD, ACS, dissection, pancreatitis, nephrolithiasis, AAA, UTI, pyelonephritis, Torsion.  R/o DDx: These are considered less likely than current impression due to history of present illness, physical exam, labs/imaging findings.  Review of prior external notes: None   Pmhx: DM  Unique Tests and My Interpretation:  126, creatinine 1.45.  GFR is 38  Stool PCR pending at time of admission. Patient has not had bowel movement during stay.   Imaging:  CT Abd/Pelvis with contrast: evaluate for structural/surgical etiology of patients' severe abdominal pain.  No acute intra-abdominal pathology.  Problem List / ED Course / Critical interventions / Medication management  Patient presenting to emergency room with enteritis ongoing for 12 days.  Recent antibiotic use thus ordered stool samples to rule out C. difficile.  Patient hemodynamically stable and well-appearing.  She does not have increased white count.  No significant change in labs other than AKI which feels likely secondary to dehydration and renal disease.  Patient does report some generalized weakness but no focal findings on exam.  Secondary to AKI and diarrheal disease we will seek admission for patient. I ordered medication including NS, Zofran  Reevaluation of the patient after these medicines showed that the patient improved Patients vitals assessed. Upon arrival patient is  hemodynamically stable.  I have reviewed the patients home medicines and have made adjustments as needed  Consult: Hospitalist who agree to admit   Plan: Admit          Final Clinical Impression(s) / ED Diagnoses Final diagnoses:  AKI (acute kidney injury) (HCC)  Enteritis    Rx / DC Orders ED Discharge Orders     None         Raford Pitcher Evalee Jefferson 06/22/23 2127    Gerhard Munch, MD 06/22/23 2317

## 2023-06-22 NOTE — Assessment & Plan Note (Signed)
Blood sugars 100s  SSI  Monitor
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
## 2023-06-22 NOTE — ED Notes (Signed)
 Patient transported to CT

## 2023-06-22 NOTE — ED Triage Notes (Signed)
Pt here for diarrhea and rectal pain x 3 days. Pt reports she recently finished amoxicillin. Denies fevers.

## 2023-06-22 NOTE — ED Notes (Signed)
Pt had a bowel movement, unable to collect stool sample due to urine contamination in stool.

## 2023-06-22 NOTE — Assessment & Plan Note (Addendum)
+  recurring episodes of NBNB diarrhea over 1-2 weeks  Minimal to mild abd pain  + sick with local norovirus outbreak at SNF where pt frequently visits mother  Also w/ recent abx use in setting of sinusitis treatment  C diff and GI panel pending  CT A&P grossly WNL Afebrile No leukocytosis  Will monitor for now  Follow closely

## 2023-06-22 NOTE — ED Notes (Signed)
Pt thought she needed to have a bowel movement, bedside commode set up. Pt sat for about  10 minutes and thought she had one but there was only urine. Pt placed back in bed.

## 2023-06-22 NOTE — Assessment & Plan Note (Signed)
 BP stable Titrate home regimen

## 2023-06-22 NOTE — Assessment & Plan Note (Signed)
Cr 1.45 w/ GFR in 30s  on presentation  Clinically dry in setting of diarrhea  IVF hydration  Monitor renal function w/ hydration Hold nephrotoxic agents  Monitor

## 2023-06-22 NOTE — Assessment & Plan Note (Signed)
 PPI ?

## 2023-06-23 DIAGNOSIS — I10 Essential (primary) hypertension: Secondary | ICD-10-CM | POA: Diagnosis not present

## 2023-06-23 DIAGNOSIS — E860 Dehydration: Secondary | ICD-10-CM | POA: Diagnosis not present

## 2023-06-23 DIAGNOSIS — J45909 Unspecified asthma, uncomplicated: Secondary | ICD-10-CM | POA: Diagnosis not present

## 2023-06-23 DIAGNOSIS — N179 Acute kidney failure, unspecified: Secondary | ICD-10-CM | POA: Diagnosis not present

## 2023-06-23 DIAGNOSIS — R197 Diarrhea, unspecified: Secondary | ICD-10-CM | POA: Diagnosis not present

## 2023-06-23 DIAGNOSIS — E785 Hyperlipidemia, unspecified: Secondary | ICD-10-CM | POA: Diagnosis not present

## 2023-06-23 DIAGNOSIS — Z981 Arthrodesis status: Secondary | ICD-10-CM | POA: Diagnosis not present

## 2023-06-23 DIAGNOSIS — E876 Hypokalemia: Secondary | ICD-10-CM | POA: Diagnosis not present

## 2023-06-23 DIAGNOSIS — Z1152 Encounter for screening for COVID-19: Secondary | ICD-10-CM | POA: Diagnosis not present

## 2023-06-23 DIAGNOSIS — Z79899 Other long term (current) drug therapy: Secondary | ICD-10-CM | POA: Diagnosis not present

## 2023-06-23 DIAGNOSIS — Z885 Allergy status to narcotic agent status: Secondary | ICD-10-CM | POA: Diagnosis not present

## 2023-06-23 DIAGNOSIS — K219 Gastro-esophageal reflux disease without esophagitis: Secondary | ICD-10-CM | POA: Diagnosis not present

## 2023-06-23 DIAGNOSIS — E669 Obesity, unspecified: Secondary | ICD-10-CM | POA: Diagnosis not present

## 2023-06-23 DIAGNOSIS — Z886 Allergy status to analgesic agent status: Secondary | ICD-10-CM | POA: Diagnosis not present

## 2023-06-23 DIAGNOSIS — E11649 Type 2 diabetes mellitus with hypoglycemia without coma: Secondary | ICD-10-CM | POA: Diagnosis not present

## 2023-06-23 DIAGNOSIS — A084 Viral intestinal infection, unspecified: Secondary | ICD-10-CM | POA: Diagnosis not present

## 2023-06-23 DIAGNOSIS — K8020 Calculus of gallbladder without cholecystitis without obstruction: Secondary | ICD-10-CM | POA: Diagnosis not present

## 2023-06-23 DIAGNOSIS — Z825 Family history of asthma and other chronic lower respiratory diseases: Secondary | ICD-10-CM | POA: Diagnosis not present

## 2023-06-23 LAB — GLUCOSE, CAPILLARY
Glucose-Capillary: 87 mg/dL (ref 70–99)
Glucose-Capillary: 89 mg/dL (ref 70–99)
Glucose-Capillary: 91 mg/dL (ref 70–99)
Glucose-Capillary: 107 mg/dL — ABNORMAL HIGH (ref 70–99)

## 2023-06-23 LAB — CBC
HCT: 32.6 % — ABNORMAL LOW (ref 36.0–46.0)
Hemoglobin: 11 g/dL — ABNORMAL LOW (ref 12.0–15.0)
MCH: 28.8 pg (ref 26.0–34.0)
MCHC: 33.7 g/dL (ref 30.0–36.0)
MCV: 85.3 fL (ref 80.0–100.0)
Platelets: 275 10*3/uL (ref 150–400)
RBC: 3.82 MIL/uL — ABNORMAL LOW (ref 3.87–5.11)
RDW: 14.2 % (ref 11.5–15.5)
WBC: 6.5 10*3/uL (ref 4.0–10.5)
nRBC: 0 % (ref 0.0–0.2)

## 2023-06-23 LAB — GASTROINTESTINAL PANEL BY PCR, STOOL (REPLACES STOOL CULTURE)

## 2023-06-23 LAB — COMPREHENSIVE METABOLIC PANEL
ALT: 13 U/L (ref 0–44)
AST: 14 U/L — ABNORMAL LOW (ref 15–41)
Albumin: 3.1 g/dL — ABNORMAL LOW (ref 3.5–5.0)
Alkaline Phosphatase: 43 U/L (ref 38–126)
Anion gap: 11 (ref 5–15)
BUN: 16 mg/dL (ref 8–23)
CO2: 23 mmol/L (ref 22–32)
Calcium: 8.7 mg/dL — ABNORMAL LOW (ref 8.9–10.3)
Chloride: 106 mmol/L (ref 98–111)
Creatinine, Ser: 1.04 mg/dL — ABNORMAL HIGH (ref 0.44–1.00)
GFR, Estimated: 56 mL/min — ABNORMAL LOW (ref >60)
Glucose, Bld: 85 mg/dL (ref 70–99)
Potassium: 3.3 mmol/L — ABNORMAL LOW (ref 3.5–5.1)
Sodium: 140 mmol/L (ref 135–145)
Total Bilirubin: 0.7 mg/dL (ref 0.0–1.2)
Total Protein: 6.1 g/dL — ABNORMAL LOW (ref 6.5–8.1)

## 2023-06-23 LAB — C DIFFICILE QUICK SCREEN W PCR REFLEX
C Diff antigen: NEGATIVE
C Diff interpretation: NOT DETECTED
C Diff toxin: NEGATIVE

## 2023-06-23 MED ORDER — SODIUM CHLORIDE 0.9 % IV SOLN: INTRAVENOUS | Status: AC

## 2023-06-23 MED ORDER — LOPERAMIDE HCL 2 MG PO CAPS
2.0000 mg | ORAL_CAPSULE | Freq: Four times a day (QID) | ORAL | Status: DC | PRN
  Administered 2023-06-23 – 2023-06-24 (×2): 2 mg via ORAL
  Filled 2023-06-23 (×2): qty 1

## 2023-06-23 MED ORDER — POTASSIUM CHLORIDE CRYS ER 20 MEQ PO TBCR
40.0000 meq | EXTENDED_RELEASE_TABLET | Freq: Once | ORAL | Status: AC
  Administered 2023-06-23: 40 meq via ORAL
  Filled 2023-06-23: qty 2

## 2023-06-23 NOTE — Plan of Care (Signed)

## 2023-06-23 NOTE — Progress Notes (Signed)
PROGRESS NOTE    Rhonda [name]  ZOX:[id number] DOB: Jul 02, 1950 DOA: 06/22/2023 PCP: [name], FNP   Brief Narrative:  73 y.o. female with medical history significant of hypertension, hyperlipidemia, prediabetes presented with diarrhea, abdominal pain and nausea.  On presentation, creatinine was 1.45, COVID/influenza/RSV PCR negative.  CT abdomen and pelvis did not show any acute abnormality.  She was started on IV fluids.  Assessment & Plan:   Diarrhea -Most likely viral diarrhea.  Stool for C. difficile negative.  Stool for GI PCR negative as well.  Use Imodium as needed. -Diarrhea slightly improving but patient still does not have any appetite and vomited this morning.  Encourage oral intake.  Continue IV fluids.  AKI Dehydration -Creatinine 1.45 on presentation: Possibly from dehydration from diarrhea.  Creatinine 1.04 this morning.  Change IV fluids to normal saline at 75 cc an hour.  Hypokalemia -Replace.  Repeat a.m. labs  Diabetes mellitus type 2 -Blood sugars controlled.  Hypertension -Monitor blood pressure.  Currently not on any antihypertensives  GERD -Continue PPI   DVT prophylaxis: Lovenox Code Status: Full Family Communication: None at bedside Disposition Plan: Status is: Observation The patient will require care spanning > 2 midnights and should be moved to inpatient because: Of severity of illness.  Need for IV fluids.  Consultants: None  Procedures: None  Antimicrobials: None   Subjective: Patient seen and examined at bedside.  Feeling slightly better but still nauseous and had vomiting this morning.  Diarrhea slightly slowing down but still present.  No appetite this morning.  No fever, worsening abdomen pain reported.  Objective: Vitals:   06/22/23 2213 06/23/23 0423 06/23/23 0732 06/23/23 0847  BP: (!) 142/58 130/60 (!) 134/58 (!) 121/52  Pulse: 76 80 76 77  Resp: (!) 21 18  18   Temp: 98.4 F (36.9 C) 98.5 F (36.9 C) 98.3 F  (36.8 C) 98.7 F (37.1 C)  TempSrc: Oral Oral Oral   SpO2: 100% 98% 100% 99%    Intake/Output Summary (Last 24 hours) at 06/23/2023 1007 Last data filed at 06/23/2023 0348 Gross per 24 hour  Intake 898.42 ml  Output 1 ml  Net 897.42 ml   There were no vitals filed for this visit.  Examination:  General exam: Appears calm and comfortable.  On room air. Respiratory system: Bilateral decreased breath sounds at bases Cardiovascular system: S1 & S2 heard, Rate controlled Gastrointestinal system: Abdomen is nondistended, soft and nontender. Normal bowel sounds heard. Extremities: No cyanosis, clubbing, edema  Central nervous system: Alert and oriented. No focal neurological deficits. Moving extremities Skin: No rashes, lesions or ulcers Psychiatry: Flat affect.  Not agitated.   Data Reviewed: I have personally reviewed following labs and imaging studies  CBC: Recent Labs  Lab 06/22/23 1035 06/23/23 0409  WBC 5.9 6.5  HGB 11.7* 11.0*  HCT 36.0 32.6*  MCV 88.2 85.3  PLT 297 275   Basic Metabolic Panel: Recent Labs  Lab 06/22/23 1035 06/23/23 0409  NA 139 140  K 3.4* 3.3*  CL 100 106  CO2 25 23  GLUCOSE 108* 85  BUN 26* 16  CREATININE 1.45* 1.04*  CALCIUM 9.8 8.7*  MG 1.9  --    GFR: CrCl cannot be calculated (Unknown ideal weight.). Liver Function Tests: Recent Labs  Lab 06/22/23 1035 06/23/23 0409  AST 19 14*  ALT 15 13  ALKPHOS 45 43  BILITOT 0.5 0.7  PROT 7.2 6.1*  ALBUMIN 3.8 3.1*   Recent Labs  Lab 06/22/23 1035  LIPASE 32   No results for input(s): "AMMONIA" in the last 168 hours. Coagulation Profile: No results for input(s): "INR", "PROTIME" in the last 168 hours. Cardiac Enzymes: No results for input(s): "CKTOTAL", "CKMB", "CKMBINDEX", "TROPONINI" in the last 168 hours. BNP (last 3 results) No results for input(s): "PROBNP" in the last 8760 hours. HbA1C: No results for input(s): "HGBA1C" in the last 72 hours. CBG: Recent Labs  Lab  06/23/23 0635 06/23/23 0731  GLUCAP 87 89   Lipid Profile: No results for input(s): "CHOL", "HDL", "LDLCALC", "TRIG", "CHOLHDL", "LDLDIRECT" in the last 72 hours. Thyroid Function Tests: No results for input(s): "TSH", "T4TOTAL", "FREET4", "T3FREE", "THYROIDAB" in the last 72 hours. Anemia Panel: No results for input(s): "VITAMINB12", "FOLATE", "FERRITIN", "TIBC", "IRON", "RETICCTPCT" in the last 72 hours. Sepsis Labs: No results for input(s): "PROCALCITON", "LATICACIDVEN" in the last 168 hours.  Recent Results (from the past 240 hours)  Resp panel by RT-PCR (RSV, Flu A&B, Covid) Anterior Nasal Swab     Status: None   Collection Time: 06/22/23 10:31 AM   Specimen: Anterior Nasal Swab  Result Value Ref Range Status   SARS Coronavirus 2 by RT PCR NEGATIVE NEGATIVE Final   Influenza A by PCR NEGATIVE NEGATIVE Final   Influenza B by PCR NEGATIVE NEGATIVE Final    Comment: (NOTE) The Xpert Xpress SARS-CoV-2/FLU/RSV plus assay is intended as an aid in the diagnosis of influenza from Nasopharyngeal swab specimens and should not be used as a sole basis for treatment. Nasal washings and aspirates are unacceptable for Xpert Xpress SARS-CoV-2/FLU/RSV testing.  Fact Sheet for Patients: BloggerCourse.com  Fact Sheet for Healthcare Providers: SeriousBroker.it  This test is not yet approved or cleared by the [place] FDA and has been authorized for detection and/or diagnosis of SARS-CoV-2 by FDA under an Emergency Use Authorization (EUA). This EUA will remain in effect (meaning this test can be used) for the duration of the COVID-19 declaration under Section 564(b)(1) of the Act, 21 U.S.C. section 360bbb-3(b)(1), unless the authorization is terminated or revoked.     Resp Syncytial Virus by PCR NEGATIVE NEGATIVE Final    Comment: (NOTE) Fact Sheet for Patients: BloggerCourse.com  Fact Sheet for Healthcare  Providers: SeriousBroker.it  This test is not yet approved or cleared by the [place] FDA and has been authorized for detection and/or diagnosis of SARS-CoV-2 by FDA under an Emergency Use Authorization (EUA). This EUA will remain in effect (meaning this test can be used) for the duration of the COVID-19 declaration under Section 564(b)(1) of the Act, 21 U.S.C. section 360bbb-3(b)(1), unless the authorization is terminated or revoked.  Performed at [facility], [address], [place], [place] [address]   C Difficile Quick Screen w PCR reflex     Status: None   Collection Time: 06/23/23  5:08 AM   Specimen: STOOL  Result Value Ref Range Status   C Diff antigen NEGATIVE NEGATIVE Final   C Diff toxin NEGATIVE NEGATIVE Final   C Diff interpretation No C. difficile detected.  Final    Comment: Performed at [facility], [address], [place], [place] [address]  Gastrointestinal Panel by PCR , Stool     Status: None   Collection Time: 06/23/23  5:08 AM   Specimen: STOOL  Result Value Ref Range Status   Campylobacter species NOT DETECTED NOT DETECTED Final   Plesimonas shigelloides NOT DETECTED NOT DETECTED Final   Salmonella species NOT DETECTED NOT DETECTED Final   Yersinia enterocolitica  NOT DETECTED NOT DETECTED Final   Vibrio species NOT DETECTED NOT DETECTED Final   Vibrio cholerae NOT DETECTED NOT DETECTED Final   Enteroaggregative E coli (EAEC) NOT DETECTED NOT DETECTED Final   Enteropathogenic E coli (EPEC) NOT DETECTED NOT DETECTED Final   Enterotoxigenic E coli (ETEC) NOT DETECTED NOT DETECTED Final   Shiga like toxin producing E coli (STEC) NOT DETECTED NOT DETECTED Final   Shigella/Enteroinvasive E coli (EIEC) NOT DETECTED NOT DETECTED Final   Cryptosporidium NOT DETECTED NOT DETECTED Final   Cyclospora cayetanensis NOT DETECTED NOT DETECTED Final   Entamoeba histolytica NOT DETECTED NOT DETECTED Final   Giardia lamblia NOT  DETECTED NOT DETECTED Final   Adenovirus F40/41 NOT DETECTED NOT DETECTED Final   Astrovirus NOT DETECTED NOT DETECTED Final   Norovirus GI/GII NOT DETECTED NOT DETECTED Final   Rotavirus A NOT DETECTED NOT DETECTED Final   Sapovirus (I, II, IV, and V) NOT DETECTED NOT DETECTED Final    Comment: Performed at [facility], [address] [place], [place] [address]         Radiology Studies: CT ABDOMEN PELVIS W CONTRAST Result Date: 06/22/2023 CLINICAL DATA:  Diarrhea with rectal pain for 3 days. Recent amoxicillin. Patient denies fever. EXAM: CT ABDOMEN AND PELVIS WITH CONTRAST TECHNIQUE: Multidetector CT imaging of the abdomen and pelvis was performed using the standard protocol following bolus administration of intravenous contrast. RADIATION DOSE REDUCTION: This exam was performed according to the departmental dose-optimization program which includes automated exposure control, adjustment of the mA and/or kV according to patient size and/or use of iterative reconstruction technique. CONTRAST:  75mL OMNIPAQUE IOHEXOL 350 MG/ML SOLN COMPARISON:  Abdominopelvic CT 12/02/2019. FINDINGS: Lower chest: Mild atelectasis or scarring at both lung bases, similar to previous CT. No confluent airspace disease or significant pleural effusion. Stable small hiatal hernia. Hepatobiliary: The liver is normal in density without suspicious focal abnormality. Multiple gallstones. No evidence of gallbladder wall thickening, surrounding inflammation or biliary ductal dilatation. Pancreas: Unremarkable. No pancreatic ductal dilatation or surrounding inflammatory changes. Spleen: Normal in size without focal abnormality. Adrenals/Urinary Tract: Both adrenal glands appear normal. No evidence of urinary tract calculus, suspicious renal lesion or hydronephrosis. Stable small cyst in the interpolar region of the right kidney for which no specific follow-up imaging is recommended. The bladder appears unremarkable for  its degree of distention. Stomach/Bowel: No enteric contrast administered. As above, small hiatal hernia. The stomach appears unremarkable for its degree of distention. Unchanged extension of small bowel and transverse colon into midline ventral hernias. No evidence of incarceration or obstruction. No significant bowel wall thickening identified. There are mild diverticular changes in the distal colon without evidence of acute surrounding inflammation. Vascular/Lymphatic: There are no enlarged abdominal or pelvic lymph nodes. Aortic and branch vessel atherosclerosis. No evidence of aneurysm or large vessel occlusion. Reproductive: Multiple calcified uterine fibroids are unchanged. No adnexal mass. Other: As above, unchanged ventral midline abdominal hernias containing small and large bowel. No evidence of incarceration or obstruction. No evidence of ascites or pneumoperitoneum. Musculoskeletal: No acute osseous findings. Previous L5-S1 fusion with progressive adjacent segment changes at L4-5. Bilateral sacroiliac degenerative changes. IMPRESSION: 1. No acute findings or explanation for the patient's symptoms. 2. Unchanged ventral midline abdominal hernias containing small and large bowel. No evidence of incarceration or obstruction. 3. Cholelithiasis without evidence of cholecystitis or biliary ductal dilatation. 4. Stable calcified uterine fibroids. 5. Progressive adjacent segment changes at L4-5. 6. Aortic atherosclerosis. Electronically Signed   By: Chrissie Noa  Purcell Mouton M.D.   On: 06/22/2023 15:31        Scheduled Meds:  enoxaparin (LOVENOX) injection  40 mg Subcutaneous Q24H   insulin aspart  0-9 Units Subcutaneous TID WC   pantoprazole  40 mg Oral Daily   Continuous Infusions:  sodium chloride 75 mL/hr at 06/23/23 0716          Glade Lloyd, MD [facility] 06/23/2023, 10:07 AM

## 2023-06-23 NOTE — Plan of Care (Signed)
  Problem: Education: Goal: Ability to describe self-care measures that may prevent or decrease complications (Diabetes Survival Skills Education) will improve 06/23/2023 1821 by Ardelle Balls, RN Outcome: Progressing 06/23/2023 1619 by Ardelle Balls, RN Outcome: Progressing   Problem: Coping: Goal: Ability to adjust to condition or change in health will improve 06/23/2023 1821 by Ardelle Balls, RN Outcome: Progressing 06/23/2023 1619 by Ardelle Balls, RN Outcome: Progressing   Problem: Fluid Volume: Goal: Ability to maintain a balanced intake and output will improve 06/23/2023 1821 by Ardelle Balls, RN Outcome: Progressing 06/23/2023 1619 by Ardelle Balls, RN Outcome: Progressing   Problem: Nutritional: Goal: Maintenance of adequate nutrition will improve 06/23/2023 1821 by Ardelle Balls, RN Outcome: Progressing 06/23/2023 1619 by Ardelle Balls, RN Outcome: Progressing   Problem: Clinical Measurements: Goal: Will remain free from infection 06/23/2023 1821 by Ardelle Balls, RN Outcome: Progressing 06/23/2023 1619 by Ardelle Balls, RN Outcome: Progressing

## 2023-06-23 NOTE — Plan of Care (Signed)
  Problem: Education: Goal: Ability to describe self-care measures that may prevent or decrease complications (Diabetes Survival Skills Education) will improve Outcome: Progressing   Problem: Fluid Volume: Goal: Ability to maintain a balanced intake and output will improve Outcome: Progressing   Problem: Health Behavior/Discharge Planning: Goal: Ability to identify and utilize available resources and services will improve Outcome: Progressing   Problem: Nutrition: Goal: Adequate nutrition will be maintained Outcome: Progressing   Problem: Safety: Goal: Ability to remain free from injury will improve Outcome: Progressing   Problem: Skin Integrity: Goal: Risk for impaired skin integrity will decrease Outcome: Progressing

## 2023-06-24 DIAGNOSIS — R197 Diarrhea, unspecified: Secondary | ICD-10-CM | POA: Diagnosis not present

## 2023-06-24 LAB — BASIC METABOLIC PANEL
Anion gap: 15 (ref 5–15)
BUN: 9 mg/dL (ref 8–23)
CO2: 23 mmol/L (ref 22–32)
Calcium: 9.6 mg/dL (ref 8.9–10.3)
Chloride: 105 mmol/L (ref 98–111)
Creatinine, Ser: 0.83 mg/dL (ref 0.44–1.00)
GFR, Estimated: >60 mL/min (ref >60)
Glucose, Bld: 93 mg/dL (ref 70–99)
Potassium: 3.5 mmol/L (ref 3.5–5.1)
Sodium: 143 mmol/L (ref 135–145)

## 2023-06-24 LAB — GLUCOSE, CAPILLARY
Glucose-Capillary: 84 mg/dL (ref 70–99)
Glucose-Capillary: 126 mg/dL — ABNORMAL HIGH (ref 70–99)
Glucose-Capillary: 61 mg/dL — ABNORMAL LOW (ref 70–99)
Glucose-Capillary: 63 mg/dL — ABNORMAL LOW (ref 70–99)
Glucose-Capillary: 93 mg/dL (ref 70–99)
Glucose-Capillary: 91 mg/dL (ref 70–99)

## 2023-06-24 LAB — MAGNESIUM: Magnesium: 1.7 mg/dL (ref 1.7–2.4)

## 2023-06-24 MED ORDER — SODIUM CHLORIDE 0.9 % IV SOLN: INTRAVENOUS | Status: AC

## 2023-06-24 MED ORDER — METOCLOPRAMIDE HCL 5 MG/ML IJ SOLN: 10.0000 mg | Freq: Four times a day (QID) | INTRAMUSCULAR | Status: DC | PRN

## 2023-06-24 MED ORDER — ONDANSETRON HCL 4 MG/2ML IJ SOLN
4.0000 mg | Freq: Four times a day (QID) | INTRAMUSCULAR | Status: DC
  Administered 2023-06-24 – 2023-06-25 (×3): 4 mg via INTRAVENOUS
  Filled 2023-06-24 (×3): qty 2

## 2023-06-24 NOTE — Plan of Care (Signed)

## 2023-06-24 NOTE — Progress Notes (Signed)
 PROGRESS NOTE    Rhonda [name]  ZOX:[id number] DOB: 1950-12-15 DOA: 06/22/2023 PCP: [name], FNP   Brief Narrative:  73 y.o. female with medical history significant of hypertension, hyperlipidemia, prediabetes presented with diarrhea, abdominal pain and nausea.  On presentation, creatinine was 1.45, COVID/influenza/RSV PCR negative.  CT abdomen and pelvis did not show any acute abnormality.  She was started on IV fluids.  Assessment & Plan:   Diarrhea -Most likely viral diarrhea.  Stool for C. difficile negative.  Stool for GI PCR negative as well.  Use Imodium as needed. -Diarrhea slightly improving but patient still does not have any appetite and vomited twice this morning.  Encourage oral intake.  Currently still on clear liquid diet.  Continue IV fluids.  AKI Dehydration -Creatinine 1.45 on presentation: Possibly from dehydration from diarrhea.  Creatinine 0.8 from this morning.  Change IV fluids to normal saline at 50 cc an hour.  Hypokalemia -Improved  Diabetes mellitus type 2 -Blood sugars controlled.  Hypertension -Monitor blood pressure.  Currently not on any antihypertensives  GERD -Continue PPI   DVT prophylaxis: Lovenox Code Status: Full Family Communication: None at bedside Disposition Plan: Status is: inpatient because: Of severity of illness.  Need for IV fluids.  Consultants: None  Procedures: None  Antimicrobials: None   Subjective: Patient seen and examined at bedside.  Diarrhea is improving but still does not have any appetite and vomited twice this morning.  Does not feel ready to go home today.  No fever, chest pain or shortness of breath reported. Objective: Vitals:   06/23/23 1551 06/23/23 2108 06/24/23 0457 06/24/23 0734  BP: (!) 130/91 (!) 143/67 121/62 132/61  Pulse: 87 80 73 67  Resp: 16 20 20 16   Temp: 98.8 F (37.1 C) 98.2 F (36.8 C) 98.6 F (37 C) 98 F (36.7 C)  TempSrc:  Oral Oral Oral  SpO2: 99% 100% 99% 100%     Intake/Output Summary (Last 24 hours) at 06/24/2023 0959 Last data filed at 06/24/2023 0334 Gross per 24 hour  Intake 1780.03 ml  Output 2 ml  Net 1778.03 ml   There were no vitals filed for this visit.  Examination:  General: Currently on room air.  No distress.  respiratory: Decreased breath sounds at bases bilaterally with some crackles CVS: Currently rate controlled; S1-S2 heard  abdominal: Soft, nontender, slightly distended, no organomegaly; bowel sounds are heard  extremities: Trace lower extremity edema; no clubbing.      Data Reviewed: I have personally reviewed following labs and imaging studies  CBC: Recent Labs  Lab 06/22/23 1035 06/23/23 0409  WBC 5.9 6.5  HGB 11.7* 11.0*  HCT 36.0 32.6*  MCV 88.2 85.3  PLT 297 275   Basic Metabolic Panel: Recent Labs  Lab 06/22/23 1035 06/23/23 0409 06/24/23 0356  NA 139 140 143  K 3.4* 3.3* 3.5  CL 100 106 105  CO2 25 23 23   GLUCOSE 108* 85 93  BUN 26* 16 9  CREATININE 1.45* 1.04* 0.83  CALCIUM 9.8 8.7* 9.6  MG 1.9  --  1.7   GFR: CrCl cannot be calculated (Unknown ideal weight.). Liver Function Tests: Recent Labs  Lab 06/22/23 1035 06/23/23 0409  AST 19 14*  ALT 15 13  ALKPHOS 45 43  BILITOT 0.5 0.7  PROT 7.2 6.1*  ALBUMIN 3.8 3.1*   Recent Labs  Lab 06/22/23 1035  LIPASE 32   No results for input(s): "AMMONIA" in the last 168 hours. Coagulation Profile: No results  for input(s): "INR", "PROTIME" in the last 168 hours. Cardiac Enzymes: No results for input(s): "CKTOTAL", "CKMB", "CKMBINDEX", "TROPONINI" in the last 168 hours. BNP (last 3 results) No results for input(s): "PROBNP" in the last 8760 hours. HbA1C: No results for input(s): "HGBA1C" in the last 72 hours. CBG: Recent Labs  Lab 06/23/23 0635 06/23/23 0731 06/23/23 1213 06/23/23 1552 06/24/23 0734  GLUCAP 87 89 91 107* 84   Lipid Profile: No results for input(s): "CHOL", "HDL", "LDLCALC", "TRIG", "CHOLHDL", "LDLDIRECT"  in the last 72 hours. Thyroid Function Tests: No results for input(s): "TSH", "T4TOTAL", "FREET4", "T3FREE", "THYROIDAB" in the last 72 hours. Anemia Panel: No results for input(s): "VITAMINB12", "FOLATE", "FERRITIN", "TIBC", "IRON", "RETICCTPCT" in the last 72 hours. Sepsis Labs: No results for input(s): "PROCALCITON", "LATICACIDVEN" in the last 168 hours.  Recent Results (from the past 240 hours)  Resp panel by RT-PCR (RSV, Flu A&B, Covid) Anterior Nasal Swab     Status: None   Collection Time: 06/22/23 10:31 AM   Specimen: Anterior Nasal Swab  Result Value Ref Range Status   SARS Coronavirus 2 by RT PCR NEGATIVE NEGATIVE Final   Influenza A by PCR NEGATIVE NEGATIVE Final   Influenza B by PCR NEGATIVE NEGATIVE Final    Comment: (NOTE) The Xpert Xpress SARS-CoV-2/FLU/RSV plus assay is intended as an aid in the diagnosis of influenza from Nasopharyngeal swab specimens and should not be used as a sole basis for treatment. Nasal washings and aspirates are unacceptable for Xpert Xpress SARS-CoV-2/FLU/RSV testing.  Fact Sheet for Patients: BloggerCourse.com  Fact Sheet for Healthcare Providers: SeriousBroker.it  This test is not yet approved or cleared by the [place] FDA and has been authorized for detection and/or diagnosis of SARS-CoV-2 by FDA under an Emergency Use Authorization (EUA). This EUA will remain in effect (meaning this test can be used) for the duration of the COVID-19 declaration under Section 564(b)(1) of the Act, 21 U.S.C. section 360bbb-3(b)(1), unless the authorization is terminated or revoked.     Resp Syncytial Virus by PCR NEGATIVE NEGATIVE Final    Comment: (NOTE) Fact Sheet for Patients: BloggerCourse.com  Fact Sheet for Healthcare Providers: SeriousBroker.it  This test is not yet approved or cleared by the [place] FDA and has been authorized  for detection and/or diagnosis of SARS-CoV-2 by FDA under an Emergency Use Authorization (EUA). This EUA will remain in effect (meaning this test can be used) for the duration of the COVID-19 declaration under Section 564(b)(1) of the Act, 21 U.S.C. section 360bbb-3(b)(1), unless the authorization is terminated or revoked.  Performed at [facility], [address], [place], [place] [address]   C Difficile Quick Screen w PCR reflex     Status: None   Collection Time: 06/23/23  5:08 AM   Specimen: STOOL  Result Value Ref Range Status   C Diff antigen NEGATIVE NEGATIVE Final   C Diff toxin NEGATIVE NEGATIVE Final   C Diff interpretation No C. difficile detected.  Final    Comment: Performed at [facility], [address], [place], [place] [address]  Gastrointestinal Panel by PCR , Stool     Status: None   Collection Time: 06/23/23  5:08 AM   Specimen: STOOL  Result Value Ref Range Status   Campylobacter species NOT DETECTED NOT DETECTED Final   Plesimonas shigelloides NOT DETECTED NOT DETECTED Final   Salmonella species NOT DETECTED NOT DETECTED Final   Yersinia enterocolitica NOT DETECTED NOT DETECTED Final   Vibrio species  NOT DETECTED NOT DETECTED Final   Vibrio cholerae NOT DETECTED NOT DETECTED Final   Enteroaggregative E coli (EAEC) NOT DETECTED NOT DETECTED Final   Enteropathogenic E coli (EPEC) NOT DETECTED NOT DETECTED Final   Enterotoxigenic E coli (ETEC) NOT DETECTED NOT DETECTED Final   Shiga like toxin producing E coli (STEC) NOT DETECTED NOT DETECTED Final   Shigella/Enteroinvasive E coli (EIEC) NOT DETECTED NOT DETECTED Final   Cryptosporidium NOT DETECTED NOT DETECTED Final   Cyclospora cayetanensis NOT DETECTED NOT DETECTED Final   Entamoeba histolytica NOT DETECTED NOT DETECTED Final   Giardia lamblia NOT DETECTED NOT DETECTED Final   Adenovirus F40/41 NOT DETECTED NOT DETECTED Final   Astrovirus NOT DETECTED NOT DETECTED Final   Norovirus GI/GII  NOT DETECTED NOT DETECTED Final   Rotavirus A NOT DETECTED NOT DETECTED Final   Sapovirus (I, II, IV, and V) NOT DETECTED NOT DETECTED Final    Comment: Performed at [facility], [address], [place], [place] [address]         Radiology Studies: CT ABDOMEN PELVIS W CONTRAST Result Date: 06/22/2023 CLINICAL DATA:  Diarrhea with rectal pain for 3 days. Recent amoxicillin. Patient denies fever. EXAM: CT ABDOMEN AND PELVIS WITH CONTRAST TECHNIQUE: Multidetector CT imaging of the abdomen and pelvis was performed using the standard protocol following bolus administration of intravenous contrast. RADIATION DOSE REDUCTION: This exam was performed according to the departmental dose-optimization program which includes automated exposure control, adjustment of the mA and/or kV according to patient size and/or use of iterative reconstruction technique. CONTRAST:  75mL OMNIPAQUE IOHEXOL 350 MG/ML SOLN COMPARISON:  Abdominopelvic CT 12/02/2019. FINDINGS: Lower chest: Mild atelectasis or scarring at both lung bases, similar to previous CT. No confluent airspace disease or significant pleural effusion. Stable small hiatal hernia. Hepatobiliary: The liver is normal in density without suspicious focal abnormality. Multiple gallstones. No evidence of gallbladder wall thickening, surrounding inflammation or biliary ductal dilatation. Pancreas: Unremarkable. No pancreatic ductal dilatation or surrounding inflammatory changes. Spleen: Normal in size without focal abnormality. Adrenals/Urinary Tract: Both adrenal glands appear normal. No evidence of urinary tract calculus, suspicious renal lesion or hydronephrosis. Stable small cyst in the interpolar region of the right kidney for which no specific follow-up imaging is recommended. The bladder appears unremarkable for its degree of distention. Stomach/Bowel: No enteric contrast administered. As above, small hiatal hernia. The stomach appears unremarkable for its  degree of distention. Unchanged extension of small bowel and transverse colon into midline ventral hernias. No evidence of incarceration or obstruction. No significant bowel wall thickening identified. There are mild diverticular changes in the distal colon without evidence of acute surrounding inflammation. Vascular/Lymphatic: There are no enlarged abdominal or pelvic lymph nodes. Aortic and branch vessel atherosclerosis. No evidence of aneurysm or large vessel occlusion. Reproductive: Multiple calcified uterine fibroids are unchanged. No adnexal mass. Other: As above, unchanged ventral midline abdominal hernias containing small and large bowel. No evidence of incarceration or obstruction. No evidence of ascites or pneumoperitoneum. Musculoskeletal: No acute osseous findings. Previous L5-S1 fusion with progressive adjacent segment changes at L4-5. Bilateral sacroiliac degenerative changes. IMPRESSION: 1. No acute findings or explanation for the patient's symptoms. 2. Unchanged ventral midline abdominal hernias containing small and large bowel. No evidence of incarceration or obstruction. 3. Cholelithiasis without evidence of cholecystitis or biliary ductal dilatation. 4. Stable calcified uterine fibroids. 5. Progressive adjacent segment changes at L4-5. 6. Aortic atherosclerosis. Electronically Signed   By: Carey Bullocks M.D.   On: 06/22/2023 15:31  Scheduled Meds:  enoxaparin (LOVENOX) injection  40 mg Subcutaneous Q24H   insulin aspart  0-9 Units Subcutaneous TID WC   pantoprazole  40 mg Oral Daily   Continuous Infusions:  sodium chloride 50 mL/hr at 06/24/23 0809          Glade Lloyd, MD [facility] 06/24/2023, 9:59 AM

## 2023-06-25 DIAGNOSIS — R197 Diarrhea, unspecified: Secondary | ICD-10-CM | POA: Diagnosis not present

## 2023-06-25 LAB — MAGNESIUM: Magnesium: 1.5 mg/dL — ABNORMAL LOW (ref 1.7–2.4)

## 2023-06-25 LAB — BASIC METABOLIC PANEL
Anion gap: 11 (ref 5–15)
BUN: 6 mg/dL — ABNORMAL LOW (ref 8–23)
CO2: 24 mmol/L (ref 22–32)
Calcium: 8.9 mg/dL (ref 8.9–10.3)
Chloride: 103 mmol/L (ref 98–111)
Creatinine, Ser: 0.84 mg/dL (ref 0.44–1.00)
GFR, Estimated: >60 mL/min (ref >60)
Glucose, Bld: 93 mg/dL (ref 70–99)
Potassium: 3.3 mmol/L — ABNORMAL LOW (ref 3.5–5.1)
Sodium: 138 mmol/L (ref 135–145)

## 2023-06-25 LAB — GLUCOSE, CAPILLARY: Glucose-Capillary: 90 mg/dL (ref 70–99)

## 2023-06-25 MED ORDER — POTASSIUM CHLORIDE CRYS ER 20 MEQ PO TBCR
40.0000 meq | EXTENDED_RELEASE_TABLET | Freq: Once | ORAL | Status: AC
  Administered 2023-06-25: 40 meq via ORAL
  Filled 2023-06-25: qty 2

## 2023-06-25 MED ORDER — MAGNESIUM SULFATE 2 GM/50ML IV SOLN
2.0000 g | Freq: Once | INTRAVENOUS | Status: AC
  Administered 2023-06-25: 2 g via INTRAVENOUS
  Filled 2023-06-25: qty 50

## 2023-06-25 MED ORDER — ONDANSETRON HCL 4 MG PO TABS: 4.0000 mg | ORAL_TABLET | Freq: Three times a day (TID) | ORAL | 0 refills | Status: AC | PRN

## 2023-06-25 MED ORDER — LOPERAMIDE HCL 2 MG PO CAPS: 2.0000 mg | ORAL_CAPSULE | Freq: Four times a day (QID) | ORAL | 0 refills | Status: DC | PRN

## 2023-06-25 NOTE — Discharge Summary (Signed)
 Physician Discharge Summary  Rhonda [name]:[id number] DOB: [birth year] DOA: 06/22/2023  PCP: [name], FNP  Admit date: 06/22/2023 Discharge date: 06/25/2023  Admitted From: Home Disposition: Home  Recommendations for Outpatient Follow-up:  Follow up with PCP in 1 week with repeat CBC/BMP Follow up in ED if symptoms worsen or new appear   Home Health: No Equipment/Devices: None  Discharge Condition: Stable CODE STATUS: Full Diet recommendation: Heart healthy  Brief/Interim Summary: 73 y.o. female with medical history significant of hypertension, hyperlipidemia, prediabetes presented with diarrhea, abdominal pain and nausea.  On presentation, creatinine was 1.45, COVID/influenza/RSV PCR negative.  CT abdomen and pelvis did not show any acute abnormality.  She was started on IV fluids.  During the hospitalization, her condition has improved.  Diarrhea is slowing down and nausea is improving.  Oral intake is improving.  Stool studies have been negative.  She feels much better and wants to go home today.  She will be discharged home today with outpatient follow-up with PCP.  Discharge Diagnoses:   Diarrhea -Most likely viral diarrhea.  Stool for C. difficile negative.  Stool for GI PCR negative as well.  Use Imodium as needed. -Treated with IV fluids.  Diarrhea slightly improving and nausea is improving.  Oral intake is improving.  She feels much better and wants to go home today.  She will be discharged home today with outpatient follow-up with PCP.    AKI Dehydration -Creatinine 1.45 on presentation: Possibly from dehydration from diarrhea.  Creatinine 0.84 from this morning.  Treated with IV fluids.  Hypokalemia -Replace prior to discharge.  Outpatient follow-up of BMP.   Diabetes mellitus type 2 with intermittent hypoglycemia -Blood sugars improved currently.  Outpatient follow-up.   Hypertension -Monitor blood pressure.  Currently stable but still intermittently on  the lower side.  Continue metoprolol.  Will hold hydrochlorothiazide, losartan and as needed Lasix till reevaluation by PCP  Hypomagnesemia Replace prior to discharge.   Hyperlipidemia -Continue statin  Discharge Instructions  Discharge Instructions     Diet - low sodium heart healthy   Complete by: As directed    Increase activity slowly   Complete by: As directed       Allergies as of 06/25/2023       Reactions   Oxycontin [oxycodone Hcl] Other (See Comments)   Hallucination   Aspirin-acetaminophen-caffeine Nausea And Vomiting   Codeine Diarrhea, Nausea And Vomiting   Stomach cramps        Medication List     STOP taking these medications    amoxicillin-clavulanate 875-125 MG tablet Commonly known as: AUGMENTIN   furosemide 20 MG tablet Commonly known as: LASIX   hydrochlorothiazide 25 MG tablet Commonly known as: HYDRODIURIL   losartan 100 MG tablet Commonly known as: COZAAR   polyethylene glycol 17 g packet Commonly known as: MIRALAX / GLYCOLAX   senna-docusate 8.6-50 MG tablet Commonly known as: Senokot-S       TAKE these medications    acetaminophen 650 MG CR tablet Commonly known as: TYLENOL Take 650 mg by mouth at bedtime as needed for pain.   albuterol 108 (90 Base) MCG/ACT inhaler Commonly known as: VENTOLIN HFA Inhale 2 puffs into the lungs every 4 (four) hours as needed for wheezing or shortness of breath.   Blood Glucose Monitoring Suppl Devi 1 each by Does not apply route in the morning, at noon, and at bedtime. May substitute to any manufacturer covered by patient's insurance.   Blood Pressure Cuff Misc 1 each  by Does not apply route daily.   dorzolamide-timolol 2-0.5 % ophthalmic solution Commonly known as: COSOPT Place 1 drop into both eyes 2 (two) times daily.   levocetirizine 5 MG tablet Commonly known as: XYZAL Take 1 tablet (5 mg total) by mouth 2 (two) times daily as needed for allergies (Can use an dose during flare  ups.).   loperamide 2 MG capsule Commonly known as: IMODIUM Take 1 capsule (2 mg total) by mouth every 6 (six) hours as needed for diarrhea or loose stools.   Magnesium 250 MG Tabs Take 1 tablet (250 mg total) by mouth daily.   metoprolol tartrate 25 MG tablet Commonly known as: LOPRESSOR Take 1 tablet by mouth twice daily   multivitamin with minerals Tabs tablet Take 1 tablet by mouth daily.   ondansetron 4 MG tablet Commonly known as: Zofran Take 1 tablet (4 mg total) by mouth every 8 (eight) hours as needed for nausea or vomiting.   potassium chloride 10 MEQ tablet Commonly known as: KLOR-CON Take 1 tablet by mouth once daily   rosuvastatin 40 MG tablet Commonly known as: CRESTOR TAKE 1 TABLET BY MOUTH ONCE DAILY PLEASE  KEEP  SCHEDULED  APPOINTMENT  FOR  FUTURE  REFILLS   VITAMIN D3 PO Take 1 tablet by mouth daily.        Follow-up Information     [name], FNP. Schedule an appointment as soon as possible for a visit in 1 week(s).   Specialty: General Practice Why: With repeat BMP Contact information: [address] [place] [address] [phone]                Allergies  Allergen Reactions   Oxycontin [Oxycodone Hcl] Other (See Comments)    Hallucination    Aspirin-Acetaminophen-Caffeine Nausea And Vomiting   Codeine Diarrhea and Nausea And Vomiting    Stomach cramps     Consultations: None   Procedures/Studies: CT ABDOMEN PELVIS W CONTRAST Result Date: 06/22/2023 CLINICAL DATA:  Diarrhea with rectal pain for 3 days. Recent amoxicillin. Patient denies fever. EXAM: CT ABDOMEN AND PELVIS WITH CONTRAST TECHNIQUE: Multidetector CT imaging of the abdomen and pelvis was performed using the standard protocol following bolus administration of intravenous contrast. RADIATION DOSE REDUCTION: This exam was performed according to the departmental dose-optimization program which includes automated exposure control, adjustment of the  mA and/or kV according to patient size and/or use of iterative reconstruction technique. CONTRAST:  75mL OMNIPAQUE IOHEXOL 350 MG/ML SOLN COMPARISON:  Abdominopelvic CT 12/02/2019. FINDINGS: Lower chest: Mild atelectasis or scarring at both lung bases, similar to previous CT. No confluent airspace disease or significant pleural effusion. Stable small hiatal hernia. Hepatobiliary: The liver is normal in density without suspicious focal abnormality. Multiple gallstones. No evidence of gallbladder wall thickening, surrounding inflammation or biliary ductal dilatation. Pancreas: Unremarkable. No pancreatic ductal dilatation or surrounding inflammatory changes. Spleen: Normal in size without focal abnormality. Adrenals/Urinary Tract: Both adrenal glands appear normal. No evidence of urinary tract calculus, suspicious renal lesion or hydronephrosis. Stable small cyst in the interpolar region of the right kidney for which no specific follow-up imaging is recommended. The bladder appears unremarkable for its degree of distention. Stomach/Bowel: No enteric contrast administered. As above, small hiatal hernia. The stomach appears unremarkable for its degree of distention. Unchanged extension of small bowel and transverse colon into midline ventral hernias. No evidence of incarceration or obstruction. No significant bowel wall thickening identified. There are mild diverticular changes in the distal colon without evidence  of acute surrounding inflammation. Vascular/Lymphatic: There are no enlarged abdominal or pelvic lymph nodes. Aortic and branch vessel atherosclerosis. No evidence of aneurysm or large vessel occlusion. Reproductive: Multiple calcified uterine fibroids are unchanged. No adnexal mass. Other: As above, unchanged ventral midline abdominal hernias containing small and large bowel. No evidence of incarceration or obstruction. No evidence of ascites or pneumoperitoneum. Musculoskeletal: No acute osseous findings.  Previous L5-S1 fusion with progressive adjacent segment changes at L4-5. Bilateral sacroiliac degenerative changes. IMPRESSION: 1. No acute findings or explanation for the patient's symptoms. 2. Unchanged ventral midline abdominal hernias containing small and large bowel. No evidence of incarceration or obstruction. 3. Cholelithiasis without evidence of cholecystitis or biliary ductal dilatation. 4. Stable calcified uterine fibroids. 5. Progressive adjacent segment changes at L4-5. 6. Aortic atherosclerosis. Electronically Signed   By: Carey Bullocks M.D.   On: 06/22/2023 15:31      Subjective: Patient seen and examined at bedside.  Feels much better and feels okay to go home today.  Diarrhea is improving and has not had any more vomiting since yesterday.  No fever reported.  Discharge Exam: Vitals:   06/25/23 0531 06/25/23 0749  BP: 125/60 135/63  Pulse: 71 69  Resp: 18 16  Temp: 98 F (36.7 C) 98.1 F (36.7 C)  SpO2: 98% 100%    General: Pt is alert, awake, not in acute distress.  On room air. Cardiovascular: rate controlled, S1/S2 + Respiratory: bilateral decreased breath sounds at bases Abdominal: Soft, NT, ND, bowel sounds + Extremities: Mild lower extremity edema, no cyanosis    The results of significant diagnostics from this hospitalization (including imaging, microbiology, ancillary and laboratory) are listed below for reference.     Microbiology: Recent Results (from the past 240 hours)  Resp panel by RT-PCR (RSV, Flu A&B, Covid) Anterior Nasal Swab     Status: None   Collection Time: 06/22/23 10:31 AM   Specimen: Anterior Nasal Swab  Result Value Ref Range Status   SARS Coronavirus 2 by RT PCR NEGATIVE NEGATIVE Final   Influenza A by PCR NEGATIVE NEGATIVE Final   Influenza B by PCR NEGATIVE NEGATIVE Final    Comment: (NOTE) The Xpert Xpress SARS-CoV-2/FLU/RSV plus assay is intended as an aid in the diagnosis of influenza from Nasopharyngeal swab specimens  and should not be used as a sole basis for treatment. Nasal washings and aspirates are unacceptable for Xpert Xpress SARS-CoV-2/FLU/RSV testing.  Fact Sheet for Patients: BloggerCourse.com  Fact Sheet for Healthcare Providers: SeriousBroker.it  This test is not yet approved or cleared by the [place] FDA and has been authorized for detection and/or diagnosis of SARS-CoV-2 by FDA under an Emergency Use Authorization (EUA). This EUA will remain in effect (meaning this test can be used) for the duration of the COVID-19 declaration under Section 564(b)(1) of the Act, 21 U.S.C. section 360bbb-3(b)(1), unless the authorization is terminated or revoked.     Resp Syncytial Virus by PCR NEGATIVE NEGATIVE Final    Comment: (NOTE) Fact Sheet for Patients: BloggerCourse.com  Fact Sheet for Healthcare Providers: SeriousBroker.it  This test is not yet approved or cleared by the [place] FDA and has been authorized for detection and/or diagnosis of SARS-CoV-2 by FDA under an Emergency Use Authorization (EUA). This EUA will remain in effect (meaning this test can be used) for the duration of the COVID-19 declaration under Section 564(b)(1) of the Act, 21 U.S.C. section 360bbb-3(b)(1), unless the authorization is terminated or revoked.  Performed at [facility],  [address], [place], [place] [address]   C Difficile Quick Screen w PCR reflex     Status: None   Collection Time: 06/23/23  5:08 AM   Specimen: STOOL  Result Value Ref Range Status   C Diff antigen NEGATIVE NEGATIVE Final   C Diff toxin NEGATIVE NEGATIVE Final   C Diff interpretation No C. difficile detected.  Final    Comment: Performed at [facility], [address] [place], [place] [address]  Gastrointestinal Panel by PCR , Stool     Status: None   Collection Time: 06/23/23  5:08 AM   Specimen:  STOOL  Result Value Ref Range Status   Campylobacter species NOT DETECTED NOT DETECTED Final   Plesimonas shigelloides NOT DETECTED NOT DETECTED Final   Salmonella species NOT DETECTED NOT DETECTED Final   Yersinia enterocolitica NOT DETECTED NOT DETECTED Final   Vibrio species NOT DETECTED NOT DETECTED Final   Vibrio cholerae NOT DETECTED NOT DETECTED Final   Enteroaggregative E coli (EAEC) NOT DETECTED NOT DETECTED Final   Enteropathogenic E coli (EPEC) NOT DETECTED NOT DETECTED Final   Enterotoxigenic E coli (ETEC) NOT DETECTED NOT DETECTED Final   Shiga like toxin producing E coli (STEC) NOT DETECTED NOT DETECTED Final   Shigella/Enteroinvasive E coli (EIEC) NOT DETECTED NOT DETECTED Final   Cryptosporidium NOT DETECTED NOT DETECTED Final   Cyclospora cayetanensis NOT DETECTED NOT DETECTED Final   Entamoeba histolytica NOT DETECTED NOT DETECTED Final   Giardia lamblia NOT DETECTED NOT DETECTED Final   Adenovirus F40/41 NOT DETECTED NOT DETECTED Final   Astrovirus NOT DETECTED NOT DETECTED Final   Norovirus GI/GII NOT DETECTED NOT DETECTED Final   Rotavirus A NOT DETECTED NOT DETECTED Final   Sapovirus (I, II, IV, and V) NOT DETECTED NOT DETECTED Final    Comment: Performed at [facility], [address] [place], [place] [address]     Labs: BNP (last 3 results) No results for input(s): "BNP" in the last 8760 hours. Basic Metabolic Panel: Recent Labs  Lab 06/22/23 1035 06/23/23 0409 06/24/23 0356 06/25/23 0509  NA 139 140 143 138  K 3.4* 3.3* 3.5 3.3*  CL 100 106 105 103  CO2 25 23 23 24   GLUCOSE 108* 85 93 93  BUN 26* 16 9 6*  CREATININE 1.45* 1.04* 0.83 0.84  CALCIUM 9.8 8.7* 9.6 8.9  MG 1.9  --  1.7 1.5*   Liver Function Tests: Recent Labs  Lab 06/22/23 1035 06/23/23 0409  AST 19 14*  ALT 15 13  ALKPHOS 45 43  BILITOT 0.5 0.7  PROT 7.2 6.1*  ALBUMIN 3.8 3.1*   Recent Labs  Lab 06/22/23 1035  LIPASE 32   No results for input(s): "AMMONIA"  in the last 168 hours. CBC: Recent Labs  Lab 06/22/23 1035 06/23/23 0409  WBC 5.9 6.5  HGB 11.7* 11.0*  HCT 36.0 32.6*  MCV 88.2 85.3  PLT 297 275   Cardiac Enzymes: No results for input(s): "CKTOTAL", "CKMB", "CKMBINDEX", "TROPONINI" in the last 168 hours. BNP: Invalid input(s): "POCBNP" CBG: Recent Labs  Lab 06/24/23 1619 06/24/23 1642 06/24/23 1713 06/24/23 2036 06/25/23 0748  GLUCAP 61* 63* 93 91 90   D-Dimer No results for input(s): "DDIMER" in the last 72 hours. Hgb A1c No results for input(s): "HGBA1C" in the last 72 hours. Lipid Profile No results for input(s): "CHOL", "HDL", "LDLCALC", "TRIG", "CHOLHDL", "LDLDIRECT" in the last 72 hours. Thyroid function studies No results for input(s): "TSH", "  T4TOTAL", "T3FREE", "THYROIDAB" in the last 72 hours.  Invalid input(s): "FREET3" Anemia work up No results for input(s): "VITAMINB12", "FOLATE", "FERRITIN", "TIBC", "IRON", "RETICCTPCT" in the last 72 hours. Urinalysis    Component Value Date/Time   COLORURINE YELLOW 06/22/2023 1055   APPEARANCEUR HAZY (A) 06/22/2023 1055   LABSPEC 1.017 06/22/2023 1055   PHURINE 5.0 06/22/2023 1055   GLUCOSEU NEGATIVE 06/22/2023 1055   HGBUR NEGATIVE 06/22/2023 1055   BILIRUBINUR NEGATIVE 06/22/2023 1055   BILIRUBINUR negative 01/10/2019 0956   KETONESUR NEGATIVE 06/22/2023 1055   PROTEINUR NEGATIVE 06/22/2023 1055   UROBILINOGEN 0.2 01/10/2019 0956   NITRITE NEGATIVE 06/22/2023 1055   LEUKOCYTESUR NEGATIVE 06/22/2023 1055   Sepsis Labs Recent Labs  Lab 06/22/23 1035 06/23/23 0409  WBC 5.9 6.5   Microbiology Recent Results (from the past 240 hours)  Resp panel by RT-PCR (RSV, Flu A&B, Covid) Anterior Nasal Swab     Status: None   Collection Time: 06/22/23 10:31 AM   Specimen: Anterior Nasal Swab  Result Value Ref Range Status   SARS Coronavirus 2 by RT PCR NEGATIVE NEGATIVE Final   Influenza A by PCR NEGATIVE NEGATIVE Final   Influenza B by PCR NEGATIVE NEGATIVE  Final    Comment: (NOTE) The Xpert Xpress SARS-CoV-2/FLU/RSV plus assay is intended as an aid in the diagnosis of influenza from Nasopharyngeal swab specimens and should not be used as a sole basis for treatment. Nasal washings and aspirates are unacceptable for Xpert Xpress SARS-CoV-2/FLU/RSV testing.  Fact Sheet for Patients: BloggerCourse.com  Fact Sheet for Healthcare Providers: SeriousBroker.it  This test is not yet approved or cleared by the [place] FDA and has been authorized for detection and/or diagnosis of SARS-CoV-2 by FDA under an Emergency Use Authorization (EUA). This EUA will remain in effect (meaning this test can be used) for the duration of the COVID-19 declaration under Section 564(b)(1) of the Act, 21 U.S.C. section 360bbb-3(b)(1), unless the authorization is terminated or revoked.     Resp Syncytial Virus by PCR NEGATIVE NEGATIVE Final    Comment: (NOTE) Fact Sheet for Patients: BloggerCourse.com  Fact Sheet for Healthcare Providers: SeriousBroker.it  This test is not yet approved or cleared by the [place] FDA and has been authorized for detection and/or diagnosis of SARS-CoV-2 by FDA under an Emergency Use Authorization (EUA). This EUA will remain in effect (meaning this test can be used) for the duration of the COVID-19 declaration under Section 564(b)(1) of the Act, 21 U.S.C. section 360bbb-3(b)(1), unless the authorization is terminated or revoked.  Performed at [facility], [address], [place], [place] [address]   C Difficile Quick Screen w PCR reflex     Status: None   Collection Time: 06/23/23  5:08 AM   Specimen: STOOL  Result Value Ref Range Status   C Diff antigen NEGATIVE NEGATIVE Final   C Diff toxin NEGATIVE NEGATIVE Final   C Diff interpretation No C. difficile detected.  Final    Comment: Performed at [facility], [address] [place], [place] [address]  Gastrointestinal Panel by PCR , Stool     Status: None   Collection Time: 06/23/23  5:08 AM   Specimen: STOOL  Result Value Ref Range Status   Campylobacter species NOT DETECTED NOT DETECTED Final   Plesimonas shigelloides NOT DETECTED NOT DETECTED Final   Salmonella species NOT DETECTED NOT DETECTED Final   Yersinia enterocolitica NOT DETECTED NOT DETECTED Final   Vibrio species NOT DETECTED NOT  DETECTED Final   Vibrio cholerae NOT DETECTED NOT DETECTED Final   Enteroaggregative E coli (EAEC) NOT DETECTED NOT DETECTED Final   Enteropathogenic E coli (EPEC) NOT DETECTED NOT DETECTED Final   Enterotoxigenic E coli (ETEC) NOT DETECTED NOT DETECTED Final   Shiga like toxin producing E coli (STEC) NOT DETECTED NOT DETECTED Final   Shigella/Enteroinvasive E coli (EIEC) NOT DETECTED NOT DETECTED Final   Cryptosporidium NOT DETECTED NOT DETECTED Final   Cyclospora cayetanensis NOT DETECTED NOT DETECTED Final   Entamoeba histolytica NOT DETECTED NOT DETECTED Final   Giardia lamblia NOT DETECTED NOT DETECTED Final   Adenovirus F40/41 NOT DETECTED NOT DETECTED Final   Astrovirus NOT DETECTED NOT DETECTED Final   Norovirus GI/GII NOT DETECTED NOT DETECTED Final   Rotavirus A NOT DETECTED NOT DETECTED Final   Sapovirus (I, II, IV, and V) NOT DETECTED NOT DETECTED Final    Comment: Performed at [facility], [address] [place], [place] [address]     Time coordinating discharge: 35 minutes  SIGNED:   Glade Lloyd, MD  [facility] 06/25/2023, 10:44 AM

## 2023-06-25 NOTE — Plan of Care (Signed)

## 2023-06-26 ENCOUNTER — Telehealth: Payer: Self-pay

## 2023-06-26 LAB — GLUCOSE, CAPILLARY: Glucose-Capillary: 85 mg/dL (ref 70–99)

## 2023-06-26 NOTE — Transitions of Care (Post Inpatient/ED Visit) (Signed)
   06/26/2023  Name: SHYNICE SIGEL MRN: [id number] DOB: 1950-12-19  Today's TOC FU Call Status: Today's TOC FU Call Status:: Unsuccessful Call (1st Attempt) Unsuccessful Call (1st Attempt) Date: 06/26/23  Attempted to reach the patient regarding the most recent Inpatient/ED visit.  Follow Up Plan: Additional outreach attempts will be made to reach the patient to complete the Transitions of Care (Post Inpatient/ED visit) call.   Eliyahu Bille A. Mliss Fritz RN, BA, [facility], CRRN [facility] RN Care Manager, Transition of Care [phone]

## 2023-06-27 ENCOUNTER — Encounter (HOSPITAL_COMMUNITY): Payer: Self-pay

## 2023-06-27 ENCOUNTER — Ambulatory Visit: Payer: Self-pay | Admitting: Nurse Practitioner

## 2023-06-27 ENCOUNTER — Telehealth: Payer: Self-pay

## 2023-06-27 ENCOUNTER — Emergency Department (HOSPITAL_COMMUNITY)
Admission: EM | Admit: 2023-06-27 | Discharge: 2023-06-28 | Disposition: A | Discharge status: Home / Self Care | Payer: No Typology Code available for payment source | Attending: Emergency Medicine | Admitting: Emergency Medicine

## 2023-06-27 ENCOUNTER — Other Ambulatory Visit: Payer: Self-pay

## 2023-06-27 DIAGNOSIS — M2548 Effusion, other site: Secondary | ICD-10-CM | POA: Diagnosis not present

## 2023-06-27 DIAGNOSIS — M545 Low back pain, unspecified: Secondary | ICD-10-CM | POA: Diagnosis not present

## 2023-06-27 DIAGNOSIS — M4319 Spondylolisthesis, multiple sites in spine: Secondary | ICD-10-CM | POA: Diagnosis not present

## 2023-06-27 DIAGNOSIS — R079 Chest pain, unspecified: Secondary | ICD-10-CM | POA: Diagnosis not present

## 2023-06-27 DIAGNOSIS — Z79899 Other long term (current) drug therapy: Secondary | ICD-10-CM | POA: Diagnosis not present

## 2023-06-27 DIAGNOSIS — E876 Hypokalemia: Secondary | ICD-10-CM | POA: Diagnosis not present

## 2023-06-27 DIAGNOSIS — I10 Essential (primary) hypertension: Secondary | ICD-10-CM | POA: Diagnosis not present

## 2023-06-27 DIAGNOSIS — R197 Diarrhea, unspecified: Secondary | ICD-10-CM | POA: Diagnosis not present

## 2023-06-27 DIAGNOSIS — M549 Dorsalgia, unspecified: Secondary | ICD-10-CM | POA: Diagnosis not present

## 2023-06-27 DIAGNOSIS — R2689 Other abnormalities of gait and mobility: Secondary | ICD-10-CM | POA: Diagnosis not present

## 2023-06-27 DIAGNOSIS — M51369 Other intervertebral disc degeneration, lumbar region without mention of lumbar back pain or lower extremity pain: Secondary | ICD-10-CM | POA: Diagnosis not present

## 2023-06-27 DIAGNOSIS — M48061 Spinal stenosis, lumbar region without neurogenic claudication: Secondary | ICD-10-CM | POA: Diagnosis not present

## 2023-06-27 LAB — CBC
HCT: 34.6 % — ABNORMAL LOW (ref 36.0–46.0)
Hemoglobin: 11.6 g/dL — ABNORMAL LOW (ref 12.0–15.0)
MCH: 28.9 pg (ref 26.0–34.0)
MCHC: 33.5 g/dL (ref 30.0–36.0)
MCV: 86.3 fL (ref 80.0–100.0)
Platelets: 302 10*3/uL (ref 150–400)
RBC: 4.01 MIL/uL (ref 3.87–5.11)
RDW: 14 % (ref 11.5–15.5)
WBC: 6.4 10*3/uL (ref 4.0–10.5)
nRBC: 0 % (ref 0.0–0.2)

## 2023-06-27 LAB — COMPREHENSIVE METABOLIC PANEL
ALT: 10 U/L (ref 0–44)
AST: 17 U/L (ref 15–41)
Albumin: 3.3 g/dL — ABNORMAL LOW (ref 3.5–5.0)
Alkaline Phosphatase: 47 U/L (ref 38–126)
Anion gap: 12 (ref 5–15)
BUN: 6 mg/dL — ABNORMAL LOW (ref 8–23)
CO2: 23 mmol/L (ref 22–32)
Calcium: 8.9 mg/dL (ref 8.9–10.3)
Chloride: 99 mmol/L (ref 98–111)
Creatinine, Ser: 0.85 mg/dL (ref 0.44–1.00)
GFR, Estimated: >60 mL/min (ref >60)
Glucose, Bld: 93 mg/dL (ref 70–99)
Potassium: 3.3 mmol/L — ABNORMAL LOW (ref 3.5–5.1)
Sodium: 134 mmol/L — ABNORMAL LOW (ref 135–145)
Total Bilirubin: 0.7 mg/dL (ref 0.0–1.2)
Total Protein: 6.7 g/dL (ref 6.5–8.1)

## 2023-06-27 LAB — TROPONIN I (HIGH SENSITIVITY)
Troponin I (High Sensitivity): 27 ng/L — ABNORMAL HIGH (ref <18)
Troponin I (High Sensitivity): 29 ng/L — ABNORMAL HIGH (ref <18)

## 2023-06-27 LAB — LIPASE, BLOOD: Lipase: 22 U/L (ref 11–51)

## 2023-06-27 NOTE — Transitions of Care (Post Inpatient/ED Visit) (Signed)
   06/27/2023  Name: RAGUEL KOSLOSKI MRN: [id number] DOB: [birth year]  Today's TOC FU Call Status: Today's TOC FU Call Status:: Unsuccessful Call (2nd Attempt) Unsuccessful Call (2nd Attempt) Date: 06/27/23  Attempted to reach the patient regarding the most recent Inpatient/ED visit.  Follow Up Plan: Additional outreach attempts will be made to reach the patient to complete the Transitions of Care (Post Inpatient/ED visit) call.   Reed Dady A. Mliss Fritz RN, BA, [facility], CRRN [facility] RN Care Manager, Transition of Care [phone]

## 2023-06-27 NOTE — Telephone Encounter (Signed)
 Chief Complaint: Diarrhea/new onset of body aches Symptoms: continued Diarrhea/Dizziness, body aches, weakness Frequency: Body aches started since dc on 06/25/2023 Pertinent Negatives: Patient denies CP, SOB Disposition: [x] ED /[] Urgent Care (no appt availability in office) / [] Appointment(In office/virtual)/ []  [place] [facility]/ [] Home Care/ [] Refused Recommended Disposition /[] [place] [facility]/ []  Follow-up with PCP Additional Notes: patient who was discharged from the hospital on 06/25/2023 calling in with continued concerns of diarrhea, dizziness, body aches and weakness. Patient states she had been admitted to the hospital for diarrhea. Patient states since coming home diarrhea has continued and reports having little to no appetite. Body aches are new for patient. Per protocol, recommendation is for the Emergency Department. Patient verbalized understanding of plan and is going to call her daughter to bring her to the ED. All questions answered.     Copied from CRM [id number]. Topic: Appointments - Appointment Scheduling >> Jun 27, 2023  9:38 AM Priscille Loveless wrote: Pt just got out of the hosp with dehydration and diarrhea. Pt states that she feels worse today than before and she is dizzy, can barely walk and the diarrhea is still coming. Reason for Disposition  Patient sounds very sick or weak to the triager  Answer Assessment - Initial Assessment Questions 1. DIARRHEA SEVERITY: "How bad is the diarrhea?" "How many more stools have you had in the past 24 hours than normal?"    - NO DIARRHEA (SCALE 0)   - MILD (SCALE 1-3): Few loose or mushy BMs; increase of 1-3 stools over normal daily number of stools; mild increase in ostomy output.   -  MODERATE (SCALE 4-7): Increase of 4-6 stools daily over normal; moderate increase in ostomy output.   -  SEVERE (SCALE 8-10; OR "WORST POSSIBLE"): Increase of 7 or more stools daily over normal; moderate increase in ostomy output; incontinence.      Mild to Moderate 2. ONSET: "When did the diarrhea begin?"      06/10/2023 3. BM CONSISTENCY: "How loose or watery is the diarrhea?"      Loose and watery 4. VOMITING: "Are you also vomiting?" If Yes, ask: "How many times in the past 24 hours?"      No vomiting since discharged from the hospital on 06/25/2023 5. ABDOMEN PAIN: "Are you having any abdomen pain?" If Yes, ask: "What does it feel like?" (e.g., crampy, dull, intermittent, constant)      Yes-crampy 6. ABDOMEN PAIN SEVERITY: If present, ask: "How bad is the pain?"  (e.g., Scale 1-10; mild, moderate, or severe)   - MILD (1-3): doesn't interfere with normal activities, abdomen soft and not tender to touch    - MODERATE (4-7): interferes with normal activities or awakens from sleep, abdomen tender to touch    - SEVERE (8-10): excruciating pain, doubled over, unable to do any normal activities       7 out of 10-worse when patient is walking 7. ORAL INTAKE: If vomiting, "Have you been able to drink liquids?" "How much liquids have you had in the past 24 hours?"     Drinking water but not a lot of food intake 8. HYDRATION: "Any signs of dehydration?" (e.g., dry mouth [not just dry lips], too weak to stand, dizziness, new weight loss) "When did you last urinate?"     Dry mouth, chapped lips, Dizziness 9. EXPOSURE: "Have you traveled to a foreign country recently?" "Have you been exposed to anyone with diarrhea?" "Could you have eaten any food that was spoiled?"  No 10. ANTIBIOTIC USE: "Are you taking antibiotics now or have you taken antibiotics in the past 2 months?"       No 11. OTHER SYMPTOMS: "Do you have any other symptoms?" (e.g., fever, blood in stool)       New onset of body aches  Protocols used: Diarrhea-A-AH

## 2023-06-27 NOTE — ED Triage Notes (Signed)
 Pt BIB EMS due to lower back pain that started Monday. Recent URI for 2 weeks. Axox4.

## 2023-06-27 NOTE — ED Provider Triage Note (Signed)
 Emergency Medicine Provider Triage Evaluation Note  Rhonda [name] , a 73 y.o. female  was evaluated in triage.  Pt complains of multiple complaints.  States she is here for diarrhea that is been ongoing since recent hospitalization, chest pain that is active and ongoing currently, pain in her bottom, runny nose.  States that she has no shortness of breath, has no nausea or vomiting.  Is endorsing abdominal pain.  Denies fevers, dysuria.  Review of Systems  Positive:  Negative:   Physical Exam  BP (!) 131/50   Pulse 76   Temp 98.4 F (36.9 C) (Oral)   Resp 20   Ht 5\' 3"  (1.6 m)   Wt 108.9 kg   SpO2 97%   BMI 42.51 kg/m  Gen:   Awake, no distress   Resp:  Normal effort  MSK:   Moves extremities without difficulty  Other:    Medical Decision Making  Medically screening exam initiated at 12:31 PM.  Appropriate orders placed.  Rhonda [name] was informed that the remainder of the evaluation will be completed by another provider, this initial triage assessment does not replace that evaluation, and the importance of remaining in the ED until their evaluation is complete.     Al Decant, PA-C 06/27/23 1232

## 2023-06-28 ENCOUNTER — Telehealth: Payer: Self-pay

## 2023-06-28 ENCOUNTER — Emergency Department (HOSPITAL_COMMUNITY): Payer: No Typology Code available for payment source

## 2023-06-28 DIAGNOSIS — M4319 Spondylolisthesis, multiple sites in spine: Secondary | ICD-10-CM | POA: Diagnosis not present

## 2023-06-28 DIAGNOSIS — M51369 Other intervertebral disc degeneration, lumbar region without mention of lumbar back pain or lower extremity pain: Secondary | ICD-10-CM | POA: Diagnosis not present

## 2023-06-28 DIAGNOSIS — M48061 Spinal stenosis, lumbar region without neurogenic claudication: Secondary | ICD-10-CM | POA: Diagnosis not present

## 2023-06-28 DIAGNOSIS — M2548 Effusion, other site: Secondary | ICD-10-CM | POA: Diagnosis not present

## 2023-06-28 MED ORDER — LORATADINE 10 MG PO TABS: 10.0000 mg | ORAL_TABLET | Freq: Every day | ORAL | Status: DC | PRN

## 2023-06-28 MED ORDER — ACETAMINOPHEN 325 MG PO TABS: 650.0000 mg | ORAL_TABLET | Freq: Four times a day (QID) | ORAL | Status: DC | PRN

## 2023-06-28 MED ORDER — POTASSIUM CHLORIDE CRYS ER 20 MEQ PO TBCR
20.0000 meq | EXTENDED_RELEASE_TABLET | Freq: Once | ORAL | Status: AC
  Administered 2023-06-28: 20 meq via ORAL
  Filled 2023-06-28: qty 1

## 2023-06-28 MED ORDER — METOPROLOL TARTRATE 25 MG PO TABS
25.0000 mg | ORAL_TABLET | Freq: Once | ORAL | Status: AC
  Administered 2023-06-28: 25 mg via ORAL
  Filled 2023-06-28: qty 1

## 2023-06-28 MED ORDER — PREDNISONE 20 MG PO TABS
40.0000 mg | ORAL_TABLET | Freq: Every day | ORAL | Status: DC
  Administered 2023-06-28: 40 mg via ORAL
  Filled 2023-06-28: qty 2

## 2023-06-28 MED ORDER — METHYLPREDNISOLONE 4 MG PO TBPK: ORAL_TABLET | ORAL | 0 refills | Status: DC

## 2023-06-28 MED ORDER — ADULT MULTIVITAMIN W/MINERALS CH
1.0000 | ORAL_TABLET | Freq: Every day | ORAL | Status: DC
  Administered 2023-06-28: 1 via ORAL
  Filled 2023-06-28: qty 1

## 2023-06-28 MED ORDER — LOPERAMIDE HCL 2 MG PO CAPS: 2.0000 mg | ORAL_CAPSULE | Freq: Four times a day (QID) | ORAL | Status: DC | PRN

## 2023-06-28 MED ORDER — MORPHINE SULFATE 15 MG PO TABS
7.5000 mg | ORAL_TABLET | ORAL | Status: DC | PRN
  Administered 2023-06-28 (×2): 7.5 mg via ORAL
  Filled 2023-06-28 (×3): qty 1

## 2023-06-28 MED ORDER — ONDANSETRON HCL 4 MG PO TABS: 4.0000 mg | ORAL_TABLET | Freq: Three times a day (TID) | ORAL | Status: DC | PRN

## 2023-06-28 NOTE — Discharge Planning (Signed)
RNCM consulted regarding safe discharge planning (Home with Home Health vs Skilled Nursing Facility Placement).  Physical Therapy evaluation placed; will follow up after recommendations from PT.     

## 2023-06-28 NOTE — ED Notes (Signed)
 Pt family would like d-dimer and [place] of right leg to rule out clot.   Pt states no hx of blood clots.

## 2023-06-28 NOTE — ED Provider Notes (Signed)
 Family at bedside wanting to take patient home.  They have a wheelchair.  They feel comfortable taking her home.  Will discharge at this time.   Coral Spikes, DO 06/28/23 2147

## 2023-06-28 NOTE — Evaluation (Signed)
 Physical Therapy Evaluation Patient Details Name: Rhonda [name] MRN: [id number] DOB: 07-Jun-1950 Today's Date: 06/28/2023  History of Present Illness  Pt is 73 yo presenting to [facility] ED on 2/25 due to back pain. Pt was recently discharged from the hospital on 2/23 due to acute kidney injury and diarrhea. PMH: HTN, HLD, pre diabetes.  Clinical Impression  Pt is presenting below baseline level of functioning. Currently pt is a Max A for bed mobility and Min A for sit to stand and transfer to chair with heavy UE support on Rw. Pt pain improves with standing and lumbar extension centralizing into the buttocks from the R foot. Currently pt is requiring significant increase in assist. If pt pain improves may require less assist and physical therapy. Due to pt current functional status, home set up and available assistance at home recommending skilled physical therapy services < 3 hours/day in order to address strength, balance and functional mobility to decrease risk for falls, injury, immobility, skin break down and re-hospitalization.          If plan is discharge home, recommend the following: A little help with walking and/or transfers;Assistance with cooking/housework;Assist for transportation;Help with stairs or ramp for entrance   Can travel by private vehicle   No    Equipment Recommendations Rolling walker (2 wheels);BSC/3in1     Functional Status Assessment Patient has had a recent decline in their functional status and demonstrates the ability to make significant improvements in function in a reasonable and predictable amount of time.     Precautions / Restrictions Precautions Precautions: Fall Restrictions Weight Bearing Restrictions Per Provider Order: No      Mobility  Bed Mobility Overal bed mobility: Needs Assistance Bed Mobility: Supine to Sit     Supine to sit: Max assist     General bed mobility comments: Max A with trunk and bil LE    Transfers Overall  transfer level: Needs assistance Equipment used: Rolling walker (2 wheels) Transfers: Bed to chair/wheelchair/BSC, Sit to/from Stand Sit to Stand: Min assist, From elevated surface   Step pivot transfers: Min assist, From elevated surface       General transfer comment: Min A to standing with RW wiht increased time and significant increase in time with initially pivoting L foot on the floor due to pain. After extension pt was able to wgt shift onto the R and bring her L foot off of the floor to step toward chair. pt was able to stand for 20 min with improved pain. Upon sitting pt reports pain back in the knee. Provided with lumbar roll and to perform seated lumbar extension 10x holding for 2 seconds intermittently as able.    Ambulation/Gait             Pre-gait activities: Due to pain worked on small steps from edge of stretcher to chair. MIn A with RW and assist navigating RW due to pain initially. Improved with time standing.       Balance Overall balance assessment: Needs assistance Sitting-balance support: Bilateral upper extremity supported, Feet supported, Feet unsupported Sitting balance-Leahy Scale: Fair Sitting balance - Comments: no overt LOB; significant pain in the R foot   Standing balance support: Bilateral upper extremity supported, Reliant on assistive device for balance, During functional activity Standing balance-Leahy Scale: Poor Standing balance comment: Min A initially improved to CGA after lumbar extension and standing       Pertinent Vitals/Pain Pain Assessment Pain Assessment: 0-10 Pain Score: 7  Pain Location:  R buttock and plantar aspect of the R foot. Occasionally she feels it in her knee. Pain Descriptors / Indicators: Shooting, Tingling Pain Intervention(s): Limited activity within patient's tolerance, Monitored during session, Premedicated before session, Other (comment) (improved in upright posture and centralized with repetitive extension)     Home Living Family/patient expects to be discharged to:: Private residence Living Arrangements: Alone Available Help at Discharge: Family;Available PRN/intermittently Type of Home: House Home Access: Stairs to enter Entrance Stairs-Rails: Right;Left;Can reach both Entrance Stairs-Number of Steps: 4 Alternate Level Stairs-Number of Steps: flight Home Layout: Two level;Able to live on main level with bedroom/bathroom Home Equipment: Rolling Walker (2 wheels);Shower seat;Grab bars - tub/shower;Hand held shower head;Cane - single point Additional Comments: was caring for her 39 yo mother who recently had to go to assisted living.    Prior Function Prior Level of Function : Independent/Modified Independent;Driving             Mobility Comments: Denies falls. Reports ambulating without an AD. ADLs Comments: Pt reports ind with ADL"s and IADL"s.     Extremity/Trunk Assessment   Upper Extremity Assessment Upper Extremity Assessment: Overall WFL for tasks assessed    Lower Extremity Assessment Lower Extremity Assessment: RLE deficits/detail RLE Deficits / Details: significant pain in the R buttocks, knee and plantar foot Along (L5/S1 distribution). Centralized to the buttocks with lumbar extensions       Communication   Communication Communication: No apparent difficulties    Cognition Arousal: Alert Behavior During Therapy: WFL for tasks assessed/performed   PT - Cognitive impairments: No apparent impairments     Following commands: Intact       Cueing Cueing Techniques: Verbal cues     General Comments General comments (skin integrity, edema, etc.): no noted skin issues outside of gown.        Assessment/Plan    PT Assessment Patient needs continued PT services  PT Problem List Decreased strength;Decreased balance;Decreased mobility;Decreased activity tolerance;Pain       PT Treatment Interventions DME instruction;Therapeutic activities;Gait  training;Therapeutic exercise;Stair training;Balance training;Functional mobility training;Neuromuscular re-education;Patient/family education    PT Goals (Current goals can be found in the Care Plan section)  Acute Rehab PT Goals Patient Stated Goal: to improve pain and return home. PT Goal Formulation: With patient Time For Goal Achievement: 07/12/23 Potential to Achieve Goals: Good    Frequency Min 1X/week        AM-PAC PT "6 Clicks" Mobility  Outcome Measure Help needed turning from your back to your side while in a flat bed without using bedrails?: A Lot Help needed moving from lying on your back to sitting on the side of a flat bed without using bedrails?: A Lot Help needed moving to and from a bed to a chair (including a wheelchair)?: A Lot Help needed standing up from a chair using your arms (e.g., wheelchair or bedside chair)?: A Lot Help needed to walk in hospital room?: Total Help needed climbing 3-5 steps with a railing? : Total 6 Click Score: 10    End of Session Equipment Utilized During Treatment: Gait belt Activity Tolerance: Patient limited by pain;Patient tolerated treatment well Patient left: in chair;with call bell/phone within reach Nurse Communication: Mobility status PT Visit Diagnosis: Pain;Other abnormalities of gait and mobility (R26.89) Pain - Right/Left: Right Pain - part of body: Leg    Time: 0921-1008 PT Time Calculation (min) (ACUTE ONLY): 47 min   Charges:   PT Evaluation $PT Eval Low Complexity: 1 Low PT Treatments $Therapeutic Exercise:  8-22 mins $Therapeutic Activity: 8-22 mins PT General Charges $$ ACUTE PT VISIT: 1 Visit         Harrel Carina, DPT, CLT  Acute Rehabilitation Services Office: [phone] (Secure chat preferred)   Claudia Desanctis 06/28/2023, 12:24 PM

## 2023-06-28 NOTE — ED Notes (Signed)
 Patient transported to MRI

## 2023-06-28 NOTE — Progress Notes (Addendum)
 3:05pm: CSW received call from patient's daughters who state patient wants to go home with a walker and requests it be delivered to bedside. Daughters are requesting patient be given a new walker to assist with mobility in the home - daughters will alternate time to be at home with patient.  CSW requested a DME order be placed by MD.  CSW spoke with Jermaine at [place] to have walker ordered.  1:10pm: CSW spoke with patient's daughters Eli Phillips and Rosetta to discuss PT recommendations. Rosetta states she does not live in [place] but [place] does and lives close to the patient. Rosetta states she is on her way to visit patient and will discuss PT recommendations with her mother and will return call to CSW with decision.  8:10am: CSW received consult for SNF placement. PT is signed in and will evaluate patient - CSW will follow for recommendations.  Edwin Dada, MSW, LCSW Transitions of Care  Clinical Social Worker II [phone]

## 2023-06-28 NOTE — ED Notes (Signed)
 Pt states she has had diarrhea since 2/8 and has some concerns about this change. Pt says she has only been eatings eggs, grits, fruit, and ice chips. Pt says during her first admission she was vomiting a lot but that has stopped.    Pt states she would prefer to use a BSC d/t pain with movement.  Pt says while lying down she has no pain with movement. When moving pt lower back and right leg is very painful and shooting in nature. No pain in the left leg.

## 2023-06-28 NOTE — ED Notes (Signed)
 Pt family requesting diet be updated to "yes with assist."

## 2023-06-28 NOTE — ED Notes (Signed)
 Pt children called regarding a missed call. RN updated pt on care.   Family was concerned if her sciatic nerve was acting up again. MRI results discussed.  There were concerns pt is not able to go home d/t condition and possibly needs rehab. RN informed family PT has been consulted and results will be relayed.

## 2023-06-28 NOTE — ED Provider Notes (Signed)
 [place] EMERGENCY DEPARTMENT AT [facility] Provider Note   CSN: [id number] Arrival date & time: 06/27/23  1148     History  Chief Complaint  Patient presents with   Back Pain    Rhonda [name] is a 73 y.o. female.  The history is provided by the patient.  Back Pain Rhonda [name] is a 73 y.o. female who presents to the Emergency Department complaining of back pain.  She presents to the emergency department accompanied by her daughter for evaluation of acute low back pain that started on Monday afternoon.  Pain is described as sharp and pins and needle sensation that radiates down to her buttocks bilaterally.  Pain is worse with ambulation.  She is unable to significantly walk secondary to pain.  She lives at home alone.  She was just discharged from the hospital on Sunday following hospitalization for acute kidney injury secondary to vomiting and diarrhea.  She does continue to have mild diarrhea but overall it is improving.  No vomiting since hospital discharge.  No fevers.  She did have an episode of bowel incontinence at home tonight.  She does have a history of lumbar fusion.     Home Medications Prior to Admission medications   Medication Sig Start Date End Date Taking? Authorizing Provider  methylPREDNISolone (MEDROL DOSEPAK) 4 MG TBPK tablet Take according to label instructions 06/28/23  Yes Tilden Fossa, MD  acetaminophen (TYLENOL) 650 MG CR tablet Take 650 mg by mouth at bedtime as needed for pain.    [provider]  albuterol (VENTOLIN HFA) 108 (90 Base) MCG/ACT inhaler Inhale 2 puffs into the lungs every 4 (four) hours as needed for wheezing or shortness of breath. 08/03/21   Kozlow, Alvira Philips, MD  Blood Glucose Monitoring Suppl DEVI 1 each by Does not apply route in the morning, at noon, and at bedtime. May substitute to any manufacturer covered by patient's insurance. 01/03/23   [name], FNP  Blood Pressure Monitoring (BLOOD PRESSURE CUFF)  MISC 1 each by Does not apply route daily. 12/30/22   Gaston Islam., NP  Cholecalciferol (VITAMIN D3 PO) Take 1 tablet by mouth daily.    [provider]  dorzolamide-timolol (COSOPT) 22.3-6.8 MG/ML ophthalmic solution Place 1 drop into both eyes 2 (two) times daily.     [provider]  levocetirizine (XYZAL) 5 MG tablet Take 1 tablet (5 mg total) by mouth 2 (two) times daily as needed for allergies (Can use an dose during flare ups.). 01/19/23   Kozlow, Alvira Philips, MD  loperamide (IMODIUM) 2 MG capsule Take 1 capsule (2 mg total) by mouth every 6 (six) hours as needed for diarrhea or loose stools. 06/25/23   Glade Lloyd, MD  Magnesium 250 MG TABS Take 1 tablet (250 mg total) by mouth daily. 12/16/20   [name], FNP  metoprolol tartrate (LOPRESSOR) 25 MG tablet Take 1 tablet by mouth twice daily 08/23/22   Gaston Islam., NP  Multiple Vitamin (MULTIVITAMIN WITH MINERALS) TABS tablet Take 1 tablet by mouth daily.    [provider]  ondansetron (ZOFRAN) 4 MG tablet Take 1 tablet (4 mg total) by mouth every 8 (eight) hours as needed for nausea or vomiting. 06/25/23   Glade Lloyd, MD  potassium chloride (KLOR-CON) 10 MEQ tablet Take 1 tablet by mouth once daily Patient taking differently: Take 10 mEq by mouth daily. Take with furosemide 06/22/23   [name], FNP  rosuvastatin (CRESTOR) 40 MG  tablet TAKE 1 TABLET BY MOUTH ONCE DAILY PLEASE  KEEP  SCHEDULED  APPOINTMENT  FOR  FUTURE  REFILLS 01/18/23   Pricilla Riffle, MD      Allergies    Oxycontin [oxycodone hcl], Aspirin-acetaminophen-caffeine, and Codeine    Review of Systems   Review of Systems  Musculoskeletal:  Positive for back pain.  All other systems reviewed and are negative.   Physical Exam Updated Vital Signs BP 126/64   Pulse 92   Temp 98.7 F (37.1 C) (Oral)   Resp 20   Ht 5\' 3"  (1.6 m)   Wt 108.9 kg   SpO2 98%   BMI 42.51 kg/m  Physical Exam Vitals and nursing note reviewed.   Constitutional:      Appearance: She is well-developed.  HENT:     Head: Normocephalic and atraumatic.  Cardiovascular:     Rate and Rhythm: Normal rate and regular rhythm.     Heart sounds: No murmur heard. Pulmonary:     Effort: Pulmonary effort is normal. No respiratory distress.     Breath sounds: Normal breath sounds.  Abdominal:     Palpations: Abdomen is soft.     Tenderness: There is no abdominal tenderness. There is no guarding or rebound.  Musculoskeletal:        General: No tenderness.     Comments: No discrete bony tenderness to palpation over the lower back, SI joints and pelvis.  2+ DP pulses bilaterally.  Skin:    General: Skin is warm and dry.  Neurological:     Mental Status: She is alert and oriented to person, place, and time.     Comments: 5 out of 5 strength in bilateral upper extremities.  5 out of 5 strength on dorsiflexion, plantarflexion in bilateral lower extremities.  She is 3-4 out of 5 strength in her quads bilaterally.  Sensation light touch intact in all 4 extremities.  Psychiatric:        Behavior: Behavior normal.     ED Results / Procedures / Treatments   Labs (all labs ordered are listed, but only abnormal results are displayed) Labs Reviewed  CBC - Abnormal; Notable for the following components:      Result Value   Hemoglobin 11.6 (*)    HCT 34.6 (*)    All other components within normal limits  COMPREHENSIVE METABOLIC PANEL - Abnormal; Notable for the following components:   Sodium 134 (*)    Potassium 3.3 (*)    BUN 6 (*)    Albumin 3.3 (*)    All other components within normal limits  TROPONIN I (HIGH SENSITIVITY) - Abnormal; Notable for the following components:   Troponin I (High Sensitivity) 27 (*)    All other components within normal limits  TROPONIN I (HIGH SENSITIVITY) - Abnormal; Notable for the following components:   Troponin I (High Sensitivity) 29 (*)    All other components within normal limits  LIPASE, BLOOD     EKG EKG Interpretation Date/Time:  Tuesday June 27 2023 12:42:39 EST Ventricular Rate:  77 PR Interval:  206 QRS Duration:  100 QT Interval:  360 QTC Calculation: 407 R Axis:   43  Text Interpretation: Normal sinus rhythm Incomplete right bundle branch block Borderline ECG Confirmed by Tilden Fossa [phone]) on 06/28/2023 2:22:07 AM  Radiology MR LUMBAR SPINE WO CONTRAST Result Date: 06/28/2023 CLINICAL DATA:  Initial evaluation for acute myelopathy. EXAM: MRI LUMBAR SPINE WITHOUT CONTRAST TECHNIQUE: Multiplanar, multisequence MR imaging of the lumbar  spine was performed. No intravenous contrast was administered. COMPARISON:  Radiograph from 04/14/2022 FINDINGS: Segmentation: Standard. Lowest well-formed disc space labeled the L5-S1 level. Alignment: 3 mm anterolisthesis of L5 on S1, with 2 mm anterolisthesis of L4 on L5. Exaggeration of the normal lumbar lordosis. Vertebrae: Postoperative changes from prior PLIF at L5-S1. Vertebral body height maintained without acute or chronic fracture. Bone marrow signal intensity within normal limits. No worrisome osseous lesions. Degenerative reactive endplate change with marrow edema present about the L4-5 interspace. No other abnormal marrow edema. Conus medullaris and cauda equina: Conus extends to the L1 level. Conus and cauda equina appear normal. Paraspinal and other soft tissues: Chronic postoperative scarring present within the posterior paraspinous soft tissues. No acute finding. Disc levels: T11-12: Seen only on sagittal projection. Disc desiccation with mild disc bulge. Mild right greater left facet hypertrophy. No significant stenosis. T12-L1: Seen only on sagittal projection. Disc desiccation with mild diffuse disc bulge. No stenosis. L1-2: Normal interspace. Mild bilateral facet spurring. No canal or foraminal stenosis. L2-3: Mild degenerative vertebral disc space narrowing with disc desiccation and diffuse disc bulge. Mild bilateral facet  hypertrophy. No significant spinal stenosis. Mild left L2 foraminal narrowing. Right neural foramen remains patent. L3-4: Disc desiccation with mild disc bulge. Superimposed small right foraminal disc protrusion closely approximates the exiting right L3 nerve root (series 8, image 17). Mild facet and ligament flavum hypertrophy. No significant spinal stenosis. Mild left L3 foraminal narrowing. Right neural foramen remains patent. L4-5: Degenerative intervertebral disc space narrowing with disc desiccation and diffuse disc bulge. Associated reactive endplate change with marginal endplate osteophytic spurring. Moderate facet and ligament flavum hypertrophy. Associated small joint effusions. Resultant moderate canal with left greater than right lateral recess stenosis. Moderate to severe left with mild to moderate right L4 foraminal narrowing. L5-S1: Prior PLIF. No residual spinal stenosis. Foramina appear patent. IMPRESSION: 1. No acute abnormality within the lumbar spine. 2. Prior PLIF at L5-S1 without residual or recurrent stenosis. 3. Adjacent segment disease at L4-5 with resultant moderate canal and left greater than right lateral recess stenosis, with moderate to severe left and mild to moderate right L4 foraminal narrowing. 4. Small right foraminal disc protrusion at L3-4, potentially affecting the exiting right L3 nerve root. Electronically Signed   By: Rise Mu M.D.   On: 06/28/2023 04:32    Procedures Procedures    Medications Ordered in ED Medications  morphine (MSIR) tablet 7.5 mg (7.5 mg Oral Given 06/28/23 0314)  loratadine (CLARITIN) tablet 10 mg (has no administration in time range)  loperamide (IMODIUM) capsule 2 mg (has no administration in time range)  multivitamin with minerals tablet 1 tablet (has no administration in time range)  ondansetron (ZOFRAN) tablet 4 mg (has no administration in time range)  acetaminophen (TYLENOL) tablet 650 mg (has no administration in time  range)  potassium chloride SA (KLOR-CON M) CR tablet 20 mEq (20 mEq Oral Given 06/28/23 0313)  metoprolol tartrate (LOPRESSOR) tablet 25 mg (25 mg Oral Given 06/28/23 7829)    ED Course/ Medical Decision Making/ A&P                                 Medical Decision Making Amount and/or Complexity of Data Reviewed Radiology: ordered.  Risk OTC drugs. Prescription drug management.   Patient with history of hypertension here for evaluation of acute low back pain without history of trauma.  She was recently admitted to the  hospital and discharged 2 days ago for AKI in setting of gastroenteritis.  Overall she is improving from her hospitalization in terms of vomiting and diarrhea.  She does have weakness in her lower extremities approximately.  MRI demonstrates degenerative changes, no acute cord compression.  Labs are with mild hypokalemia, improved renal function compared to hospital discharge.  She was treated with morphine for her pain.  Even with pain control she is unable to ambulate even with assistance.  TOC, PT consult placed regarding ambulatory assistance.  She does have minimally elevated but flat troponins, no active chest pain.  Current clinical picture is not consistent with ACS, PE, DVT, sepsis.        Final Clinical Impression(s) / ED Diagnoses Final diagnoses:  Acute midline low back pain without sciatica    Rx / DC Orders ED Discharge Orders          Ordered    methylPREDNISolone (MEDROL DOSEPAK) 4 MG TBPK tablet        06/28/23 0650              Tilden Fossa, MD 06/28/23 [phone]

## 2023-06-28 NOTE — Transitions of Care (Post Inpatient/ED Visit) (Signed)
   06/28/2023  Name: Rhonda [name] MRN: [id number] DOB: 08/09/1950  Today's TOC FU Call Status: Today's TOC FU Call Status:: Unsuccessful Call (3rd Attempt) Unsuccessful Call (3rd Attempt) Date: 06/28/23  Attempted to reach the patient regarding the most recent Inpatient/ED visit.  Follow Up Plan: No further outreach attempts will be made at this time. We have been unable to contact the patient.  Tyshon Fanning A. Mliss Fritz RN, BA, [facility], CRRN [facility] RN Care Manager, Transition of Care [phone]

## 2023-06-29 ENCOUNTER — Inpatient Hospital Stay: Payer: Medicare Other | Admitting: Nurse Practitioner

## 2023-06-30 NOTE — Progress Notes (Signed)
 Angel recent hospital visit. I have scheduled a follow up with you on 3/10  Gwenevere Ghazi  [facility], [facility] Guide  Direct Dial: [phone]  Fax [phone]

## 2023-07-06 ENCOUNTER — Telehealth: Payer: Self-pay | Admitting: *Deleted

## 2023-07-06 ENCOUNTER — Encounter: Payer: Self-pay | Admitting: Family Medicine

## 2023-07-06 ENCOUNTER — Ambulatory Visit: Payer: Self-pay | Admitting: Family Medicine

## 2023-07-06 VITALS — BP 124/80 | HR 67 | Temp 97.9°F | Ht 63.0 in | Wt 238.0 lb

## 2023-07-06 DIAGNOSIS — M5442 Lumbago with sciatica, left side: Secondary | ICD-10-CM

## 2023-07-06 DIAGNOSIS — M5440 Lumbago with sciatica, unspecified side: Secondary | ICD-10-CM | POA: Diagnosis not present

## 2023-07-06 DIAGNOSIS — E876 Hypokalemia: Secondary | ICD-10-CM

## 2023-07-06 DIAGNOSIS — E66813 Obesity, class 3: Secondary | ICD-10-CM | POA: Diagnosis not present

## 2023-07-06 DIAGNOSIS — Z6841 Body Mass Index (BMI) 40.0 and over, adult: Secondary | ICD-10-CM

## 2023-07-06 MED ORDER — DICLOFENAC SODIUM 1 % EX GEL: 2.0000 g | Freq: Four times a day (QID) | CUTANEOUS | 1 refills | Status: DC

## 2023-07-06 MED ORDER — LIDOCAINE 5 % EX PTCH
1.0000 | MEDICATED_PATCH | CUTANEOUS | 0 refills | Status: DC
Start: 2023-07-06 — End: 2024-01-02

## 2023-07-06 NOTE — Progress Notes (Signed)
 I,[name], CMA,acting as a Neurosurgeon for [employer], NP.,have documented all relevant documentation on the behalf of Ellender Hose, NP,as directed by  Ellender Hose, NP while in the presence of Ellender Hose, NP.  Subjective:  Patient ID: Rhonda [name] , female    DOB: 02-Feb-1951 , 73 y.o.   MRN: [id number]  Chief Complaint  Patient presents with   hospital f/u    HPI  Patient is a 73 year old female who presents today for a hospital follow up. Patient reports she was having back pain with sciatic nerve pain that radiates to her left leg. Patient has a medical history chronic sciatic pain and had lumbar fusion surgery in 2012. She was admitted in the hospital last month for AKI d/t nausea,vomiting. She states that she feels a lot better now since vomiting has resolved. She denies any incontinence of urine or bowel.      Past Medical History:  Diagnosis Date   Anemia    hx none since menopause   Arthritis    "maybe in my legs" (06/15/2016)   Asthma 2018   none since   Complication of anesthesia    woke up during colonoscopy 03-31-16 Dr. Laure Kidney   GERD (gastroesophageal reflux disease)    History of hiatal hernia    Hyperlipemia    Hypertension    Hypokalemia due to excessive gastrointestinal loss of potassium 12/02/2019   Pre-diabetes 2019   Thyroid nodule 2010   "no problems w/them since; on a pill for a while" (06/15/2016)     Family History  Problem Relation Age of Onset   COPD Father    Breast cancer Neg Hx      Current Outpatient Medications:    acetaminophen (TYLENOL) 650 MG CR tablet, Take 650 mg by mouth at bedtime as needed for pain., Disp: , Rfl:    albuterol (VENTOLIN HFA) 108 (90 Base) MCG/ACT inhaler, Inhale 2 puffs into the lungs every 4 (four) hours as needed for wheezing or shortness of breath., Disp: 18 g, Rfl: 1   Blood Glucose Monitoring Suppl DEVI, 1 each by Does not apply route in the morning, at noon, and at bedtime. May substitute to any  manufacturer covered by patient's insurance., Disp: 1 each, Rfl: 0   Blood Pressure Monitoring (BLOOD PRESSURE CUFF) MISC, 1 each by Does not apply route daily., Disp: 1 each, Rfl: 0   Cholecalciferol (VITAMIN D3 PO), Take 1 tablet by mouth daily., Disp: , Rfl:    dorzolamide-timolol (COSOPT) 22.3-6.8 MG/ML ophthalmic solution, Place 1 drop into both eyes 2 (two) times daily. , Disp: , Rfl:    levocetirizine (XYZAL) 5 MG tablet, Take 1 tablet (5 mg total) by mouth 2 (two) times daily as needed for allergies (Can use an dose during flare ups.)., Disp: 60 tablet, Rfl: 5   lidocaine (LIDODERM) 5 %, Place 1 patch onto the skin daily. Remove & Discard patch within 12 hours or as directed by MD, Disp: 30 patch, Rfl: 0   loperamide (IMODIUM) 2 MG capsule, Take 1 capsule (2 mg total) by mouth every 6 (six) hours as needed for diarrhea or loose stools., Disp: 30 capsule, Rfl: 0   Magnesium 250 MG TABS, Take 1 tablet (250 mg total) by mouth daily., Disp: 30 tablet, Rfl: 0   methylPREDNISolone (MEDROL DOSEPAK) 4 MG TBPK tablet, Take according to label instructions, Disp: 21 each, Rfl: 0   metoprolol tartrate (LOPRESSOR) 25 MG tablet, Take 1 tablet by mouth twice daily,  Disp: 180 tablet, Rfl: 3   Multiple Vitamin (MULTIVITAMIN WITH MINERALS) TABS tablet, Take 1 tablet by mouth daily., Disp: , Rfl:    ondansetron (ZOFRAN) 4 MG tablet, Take 1 tablet (4 mg total) by mouth every 8 (eight) hours as needed for nausea or vomiting., Disp: 20 tablet, Rfl: 0   potassium chloride (KLOR-CON) 10 MEQ tablet, Take 1 tablet by mouth once daily (Patient taking differently: Take 10 mEq by mouth daily. Take with furosemide), Disp: 90 tablet, Rfl: 0   rosuvastatin (CRESTOR) 40 MG tablet, TAKE 1 TABLET BY MOUTH ONCE DAILY PLEASE  KEEP  SCHEDULED  APPOINTMENT  FOR  FUTURE  REFILLS, Disp: 30 tablet, Rfl: 10   Allergies  Allergen Reactions   Oxycontin [Oxycodone Hcl] Other (See Comments)    Hallucination     Aspirin-Acetaminophen-Caffeine Nausea And Vomiting   Codeine Diarrhea and Nausea And Vomiting    Stomach cramps      Review of Systems  Constitutional: Negative.   HENT: Negative.    Eyes: Negative.   Respiratory: Negative.    Cardiovascular:  Negative for chest pain, palpitations and leg swelling.  Gastrointestinal:  Negative for diarrhea, nausea and vomiting.  Musculoskeletal:  Positive for back pain and gait problem.       Uses cane d/t sciatic nerve pain  Skin: Negative.   Psychiatric/Behavioral: Negative.       Today's Vitals   07/06/23 1047  BP: 124/80  Pulse: 67  Temp: 97.9 F (36.6 C)  TempSrc: Oral  Weight: 238 lb (108 kg)  Height: 5\' 3"  (1.6 m)  PainSc: 0-No pain   Body mass index is 42.16 kg/m.  Wt Readings from Last 3 Encounters:  07/06/23 238 lb (108 kg)  06/27/23 240 lb (108.9 kg)  06/25/23 247 lb 12.8 oz (112.4 kg)    The ASCVD Risk score (Arnett DK, et al., 2019) failed to calculate for the following reasons:   Risk score cannot be calculated because patient has a medical history suggesting prior/existing ASCVD  Objective:  Physical Exam HENT:     Head: Normocephalic.  Cardiovascular:     Rate and Rhythm: Normal rate.  Pulmonary:     Effort: Pulmonary effort is normal.     Breath sounds: Normal breath sounds.  Abdominal:     General: Abdomen is flat.     Palpations: Abdomen is soft.  Skin:    General: Skin is warm and dry.  Neurological:     General: No focal deficit present.     Mental Status: She is alert and oriented to person, place, and time.         Assessment And Plan:  Back pain of lumbar region with sciatica -     Lidocaine; Place 1 patch onto the skin daily. Remove & Discard patch within 12 hours or as directed by MD  Dispense: 30 patch; Refill: 0  Hypokalemia Assessment & Plan: Check labs  Orders: -     CMP14+EGFR  Class 3 severe obesity due to excess calories without serious comorbidity with body mass index (BMI) of  40.0 to 44.9 in adult [facility]) Assessment & Plan: She is encouraged to strive for BMI less than 30 to decrease cardiac risk. Advised to aim for at least 150 minutes of exercise per week.      Return for keep next appt.  Patient was given opportunity to ask questions. Patient verbalized understanding of the plan and was able to repeat key elements of the plan. All questions were  answered to their satisfaction.    I, Ellender Hose, NP, have reviewed all documentation for this visit. The documentation on 07/14/23 for the exam, diagnosis, procedures, and orders are all accurate and complete.   IF YOU HAVE BEEN REFERRED TO A SPECIALIST, IT MAY TAKE 1-2 WEEKS TO SCHEDULE/PROCESS THE REFERRAL. IF YOU HAVE NOT HEARD FROM US/SPECIALIST IN TWO WEEKS, PLEASE GIVE [place] A CALL AT [phone].

## 2023-07-06 NOTE — Progress Notes (Signed)
 Complex Care Management Care Guide Note  07/06/2023 Name: MATTY DEAMER MRN: [id number] DOB: Mar 03, 1951  Fay Records Klingensmith is a 73 y.o. year old female who is a primary care patient of [name], FNP and is actively engaged with the care management team. I reached out to [name] by phone today to assist with re-scheduling  with the RN Case Manager.  Follow up plan: Unsuccessful telephone outreach attempt made. A HIPAA compliant phone message was left for the patient providing contact information and requesting a return call.  Gwenevere Ghazi  [facility]  [facility], [facility] Guide  Direct Dial: [phone]  Fax [phone]

## 2023-07-06 NOTE — Progress Notes (Signed)
 Complex Care Management Care Guide Note  07/06/2023 Name: [name] MRN: [id number] DOB: March 15, 1951  Rhonda [name] is a 73 y.o. year old female who is a primary care patient of [name], FNP and is actively engaged with the care management team. I reached out to [name] by phone today to assist with re-scheduling  with the RN Case Manager.  Follow up plan: Telephone appointment with complex care management team member scheduled for:  3/24  Gwenevere Ghazi  [facility]  [facility], [facility] Guide  Direct Dial: [phone]  Fax [phone]

## 2023-07-07 LAB — CMP14+EGFR
ALT: 15 IU/L (ref 0–32)
AST: 14 IU/L (ref 0–40)
Albumin: 4.1 g/dL (ref 3.8–4.8)
Alkaline Phosphatase: 63 IU/L (ref 44–121)
BUN/Creatinine Ratio: 25 (ref 12–28)
BUN: 22 mg/dL (ref 8–27)
Bilirubin Total: 0.4 mg/dL (ref 0.0–1.2)
CO2: 30 mmol/L — ABNORMAL HIGH (ref 20–29)
Calcium: 9.4 mg/dL (ref 8.7–10.3)
Chloride: 95 mmol/L — ABNORMAL LOW (ref 96–106)
Creatinine, Ser: 0.87 mg/dL (ref 0.57–1.00)
Globulin, Total: 3 g/dL (ref 1.5–4.5)
Glucose: 107 mg/dL — ABNORMAL HIGH (ref 70–99)
Potassium: 4 mmol/L (ref 3.5–5.2)
Sodium: 139 mmol/L (ref 134–144)
Total Protein: 7.1 g/dL (ref 6.0–8.5)
eGFR: 70 mL/min/{1.73_m2} (ref >59)

## 2023-07-14 DIAGNOSIS — M5440 Lumbago with sciatica, unspecified side: Secondary | ICD-10-CM | POA: Insufficient documentation

## 2023-07-14 NOTE — Assessment & Plan Note (Signed)
 She is encouraged to strive for BMI less than 30 to decrease cardiac risk. Advised to aim for at least 150 minutes of exercise per week.

## 2023-07-14 NOTE — Assessment & Plan Note (Signed)
 Check labs

## 2023-07-14 NOTE — Progress Notes (Signed)
 Potassium levels is back to normal.Continue to eat foods rich in potassium such as bananas and oranges.

## 2023-07-18 ENCOUNTER — Other Ambulatory Visit: Payer: Self-pay | Admitting: Nurse Practitioner

## 2023-07-24 ENCOUNTER — Ambulatory Visit: Payer: Self-pay

## 2023-07-24 NOTE — Patient Instructions (Signed)
 Visit Information  Thank you for taking time to visit with me today. Please don't hesitate to contact me if I can be of assistance to you.   Following are the goals we discussed today:   Goals Addressed             This Visit's Progress    RN Care Coordination Activities: further follow up needed       Care Coordination Interventions: Spoke with patient briefly  Discussed patient would like to reschedule this nurse care coordination follow up call due to her mother has passed away today unexpectedly Discussed plans with patient for ongoing care coordination follow up and provided patient with direct contact information for nurse care coordinator Scheduled a 2 week follow up call with patient for 08/07/23 @1 :30 PM Educated patient about grief counseling with Jenel Lucks LCSW            Our next appointment is by telephone on 08/07/23 at 1:30 PM  Please call the care guide team at [phone] if you need to cancel or reschedule your appointment.   If you are experiencing a Mental Health or Behavioral Health Crisis or need someone to talk to, please call 1-800-273-TALK (toll free, 24 hour hotline)  Patient verbalizes understanding of instructions and care plan provided today and agrees to view in MyChart. Active MyChart status and patient understanding of how to access instructions and care plan via MyChart confirmed with patient.     Delsa Sale RN BSN CCM [place]  [facility], [facility] Nurse Care Coordinator  Direct Dial: [phone] Website: [email or web address]

## 2023-07-24 NOTE — Patient Outreach (Signed)
 Care Coordination   Follow Up Visit Note   07/24/2023 Name: Rhonda [name] MRN: [id number] DOB: January 28, 1951  Rhonda [name] is a 73 y.o. year old female who sees [name], FNP for primary care. I spoke with  Rhonda [name] by phone today.  What matters to the patients health and wellness today?  Patient is grieving over the recent loss of her mother.     Goals Addressed             This Visit's Progress    RN Care Coordination Activities: further follow up needed       Care Coordination Interventions: Spoke with patient briefly  Discussed patient would like to reschedule this nurse care coordination follow up call due to her mother has passed away today unexpectedly Discussed plans with patient for ongoing care coordination follow up and provided patient with direct contact information for nurse care coordinator Scheduled a 2 week follow up call with patient for 08/07/23 @1 :30 PM Educated patient about grief counseling with Jenel Lucks LCSW        Interventions Today    Flowsheet Row Most Recent Value  Chronic Disease   Chronic disease during today's visit Other  [grief]  General Interventions   General Interventions Discussed/Reviewed General Interventions Discussed, General Interventions Reviewed  Education Interventions   Education Provided Provided Education  Provided Verbal Education On Mental Health/Coping with Illness  Mental Health Interventions   Mental Health Discussed/Reviewed Mental Health Discussed, Mental Health Reviewed, Grief and Loss          SDOH assessments and interventions completed:  No     Care Coordination Interventions:  Yes, provided   Follow up plan: Follow up call scheduled for 08/07/23 @1 :30 PM    Encounter Outcome:  Patient Visit Completed

## 2023-08-01 NOTE — Progress Notes (Signed)
 Rhonda [name], CMA,acting as a Neurosurgeon for Rhonda Epley, FNP.,have documented all relevant documentation on the behalf of Rhonda Epley, FNP,as directed by  Rhonda Epley, FNP while in the presence of Rhonda Epley, FNP.  Subjective:  Patient ID: Rhonda [name] , female    DOB: [birth year] , 73 y.o.   MRN: [id number]  Chief Complaint  Patient presents with   Cough    HPI  Patient presents today for a cough, patient reports it first start on 07/27/2023. She also reports pain in her ears and throat. She reports she had a low grade fever around the 27th or 28th. States "I think I have the flu"     Past Medical History:  Diagnosis Date   Anemia    hx none since menopause   Arthritis    "maybe in my legs" (06/15/2016)   Asthma 2018   none since   Complication of anesthesia    woke up during colonoscopy 03-31-16 Dr. Teddie Favre   GERD (gastroesophageal reflux disease)    History of hiatal hernia    Hyperlipemia    Hypertension    Hypokalemia due to excessive gastrointestinal loss of potassium 12/02/2019   Pre-diabetes 2019   Thyroid nodule 2010   "no problems w/them since; on a pill for a while" (06/15/2016)     Family History  Problem Relation Age of Onset   COPD Father    Breast cancer Neg Hx      Current Outpatient Medications:    acetaminophen (TYLENOL) 650 MG CR tablet, Take 650 mg by mouth at bedtime as needed for pain., Disp: , Rfl:    Albuterol-Budesonide (AIRSUPRA) 90-80 MCG/ACT AERO, Inhale 2 puffs into the lungs every 6 (six) hours as needed., Disp: 32.1 g, Rfl: 2   amoxicillin-clavulanate (AUGMENTIN) 875-125 MG tablet, Take 1 tablet by mouth 2 (two) times daily., Disp: 14 tablet, Rfl: 0   benzonatate (TESSALON PERLES) 100 MG capsule, Take 1 capsule (100 mg total) by mouth every 6 (six) hours as needed., Disp: 30 capsule, Rfl: 0   Blood Glucose Monitoring Suppl DEVI, 1 each by Does not apply route in the morning, at noon, and at bedtime. May substitute to any manufacturer  covered by patient's insurance., Disp: 1 each, Rfl: 0   Blood Pressure Monitoring (BLOOD PRESSURE CUFF) MISC, 1 each by Does not apply route daily., Disp: 1 each, Rfl: 0   Cholecalciferol (VITAMIN D3 PO), Take 1 tablet by mouth daily., Disp: , Rfl:    dorzolamide-timolol (COSOPT) 22.3-6.8 MG/ML ophthalmic solution, Place 1 drop into both eyes 2 (two) times daily. , Disp: , Rfl:    levocetirizine (XYZAL) 5 MG tablet, Take 1 tablet (5 mg total) by mouth 2 (two) times daily as needed for allergies (Can use an dose during flare ups.)., Disp: 60 tablet, Rfl: 5   lidocaine (LIDODERM) 5 %, Place 1 patch onto the skin daily. Remove & Discard patch within 12 hours or as directed by MD, Disp: 30 patch, Rfl: 0   loperamide (IMODIUM) 2 MG capsule, Take 1 capsule (2 mg total) by mouth every 6 (six) hours as needed for diarrhea or loose stools., Disp: 30 capsule, Rfl: 0   Magnesium 250 MG TABS, Take 1 tablet (250 mg total) by mouth daily., Disp: 30 tablet, Rfl: 0   Multiple Vitamin (MULTIVITAMIN WITH MINERALS) TABS tablet, Take 1 tablet by mouth daily., Disp: , Rfl:    ondansetron (ZOFRAN) 4 MG tablet, Take 1 tablet (4 mg total) by mouth every  8 (eight) hours as needed for nausea or vomiting., Disp: 20 tablet, Rfl: 0   potassium chloride (KLOR-CON) 10 MEQ tablet, Take 1 tablet by mouth once daily (Patient taking differently: Take 10 mEq by mouth daily. Take with furosemide), Disp: 90 tablet, Rfl: 0   rosuvastatin (CRESTOR) 40 MG tablet, TAKE 1 TABLET BY MOUTH ONCE DAILY PLEASE  KEEP  SCHEDULED  APPOINTMENT  FOR  FUTURE  REFILLS, Disp: 30 tablet, Rfl: 10   albuterol (VENTOLIN HFA) 108 (90 Base) MCG/ACT inhaler, Inhale 2 puffs into the lungs every 6 (six) hours as needed for wheezing or shortness of breath., Disp: 8 g, Rfl: 2   metoprolol tartrate (LOPRESSOR) 25 MG tablet, Take 1 tablet by mouth twice daily, Disp: 180 tablet, Rfl: 3   Allergies  Allergen Reactions   Oxycontin [Oxycodone Hcl] Other (See Comments)     Hallucination    Aspirin-Acetaminophen-Caffeine Nausea And Vomiting   Codeine Diarrhea and Nausea And Vomiting    Stomach cramps      Review of Systems  Constitutional: Negative.   HENT:  Negative for ear pain.   Respiratory:  Positive for cough.   Cardiovascular: Negative.   Neurological: Negative.   Psychiatric/Behavioral: Negative.    All other systems reviewed and are negative.    Today's Vitals   08/02/23 1617  BP: 130/76  Pulse: 96  Temp: 98.7 F (37.1 C)  TempSrc: Oral  Weight: 231 lb 9.6 oz (105.1 kg)  Height: 5\' 3"  (1.6 m)  PainSc: 0-No pain   Body mass index is 41.03 kg/m.  Wt Readings from Last 3 Encounters:  08/02/23 231 lb 9.6 oz (105.1 kg)  07/06/23 238 lb (108 kg)  06/27/23 240 lb (108.9 kg)     Objective:  Physical Exam Vitals and nursing note reviewed.  Constitutional:      General: She is not in acute distress.    Appearance: Normal appearance. She is obese.  HENT:     Head: Normocephalic and atraumatic.     Nose: Nose normal. No nasal tenderness.     Right Turbinates: Swollen.     Left Turbinates: Swollen.     Right Sinus: No maxillary sinus tenderness or frontal sinus tenderness.     Left Sinus: No maxillary sinus tenderness or frontal sinus tenderness.  Eyes:     Pupils: Pupils are equal, round, and reactive to light.  Cardiovascular:     Rate and Rhythm: Normal rate and regular rhythm.     Pulses: Normal pulses.     Heart sounds: Normal heart sounds. No murmur heard. Pulmonary:     Effort: Pulmonary effort is normal. No respiratory distress.     Breath sounds: Rhonchi present. No wheezing.  Abdominal:     General: Bowel sounds are normal.     Palpations: Abdomen is soft.  Musculoskeletal:        General: Normal range of motion.     Cervical back: Normal range of motion.  Skin:    General: Skin is warm and dry.  Neurological:     General: No focal deficit present.     Mental Status: She is alert. Mental status is at baseline.   Psychiatric:        Attention and Perception: Attention normal.        Mood and Affect: Mood is depressed.        Speech: Speech normal.        Behavior: Behavior normal.        Cognition and  Memory: Cognition normal.         Assessment And Plan:  Acute cough -     Airsupra; Inhale 2 puffs into the lungs every 6 (six) hours as needed.  Dispense: 32.1 g; Refill: 2 -     Triamcinolone Acetonide -     Benzonatate; Take 1 capsule (100 mg total) by mouth every 6 (six) hours as needed.  Dispense: 30 capsule; Refill: 0 -     Albuterol Sulfate HFA; Inhale 2 puffs into the lungs every 6 (six) hours as needed for wheezing or shortness of breath.  Dispense: 8 g; Refill: 2  Upper respiratory tract infection, unspecified type -     Amoxicillin-Pot Clavulanate; Take 1 tablet by mouth 2 (two) times daily.  Dispense: 14 tablet; Refill: 0 -     Albuterol Sulfate HFA; Inhale 2 puffs into the lungs every 6 (six) hours as needed for wheezing or shortness of breath.  Dispense: 8 g; Refill: 2    No follow-ups on file.  Patient was given opportunity to ask questions. Patient verbalized understanding of the plan and was able to repeat key elements of the plan. All questions were answered to their satisfaction.    Inge Mangle, FNP, have reviewed all documentation for this visit. The documentation on 08/02/23 for the exam, diagnosis, procedures, and orders are all accurate and complete.   IF YOU HAVE BEEN REFERRED TO A SPECIALIST, IT MAY TAKE 1-2 WEEKS TO SCHEDULE/PROCESS THE REFERRAL. IF YOU HAVE NOT HEARD FROM US /SPECIALIST IN TWO WEEKS, PLEASE GIVE US  A CALL AT [phone].

## 2023-08-02 ENCOUNTER — Ambulatory Visit: Payer: Self-pay | Admitting: Nurse Practitioner

## 2023-08-02 ENCOUNTER — Encounter: Payer: Self-pay | Admitting: Nurse Practitioner

## 2023-08-02 VITALS — BP 130/76 | HR 96 | Temp 98.7°F | Ht 63.0 in | Wt 231.6 lb

## 2023-08-02 DIAGNOSIS — R051 Acute cough: Secondary | ICD-10-CM | POA: Diagnosis not present

## 2023-08-02 DIAGNOSIS — J069 Acute upper respiratory infection, unspecified: Secondary | ICD-10-CM | POA: Diagnosis not present

## 2023-08-02 MED ORDER — AMOXICILLIN-POT CLAVULANATE 875-125 MG PO TABS: 1.0000 | ORAL_TABLET | Freq: Two times a day (BID) | ORAL | 0 refills | Status: DC

## 2023-08-02 MED ORDER — TRIAMCINOLONE ACETONIDE 40 MG/ML IJ SUSP
40.0000 mg | Freq: Once | INTRAMUSCULAR | Status: AC
  Administered 2023-08-02: 40 mg via INTRAMUSCULAR

## 2023-08-02 MED ORDER — AIRSUPRA 90-80 MCG/ACT IN AERO: 2.0000 | INHALATION_SPRAY | Freq: Four times a day (QID) | RESPIRATORY_TRACT | 2 refills | Status: AC | PRN

## 2023-08-03 MED ORDER — ALBUTEROL SULFATE HFA 108 (90 BASE) MCG/ACT IN AERS: 2.0000 | INHALATION_SPRAY | Freq: Four times a day (QID) | RESPIRATORY_TRACT | 2 refills | Status: DC | PRN

## 2023-08-03 MED ORDER — BENZONATATE 100 MG PO CAPS: 100.0000 mg | ORAL_CAPSULE | Freq: Four times a day (QID) | ORAL | 0 refills | Status: DC | PRN

## 2023-08-06 ENCOUNTER — Other Ambulatory Visit: Payer: Self-pay | Admitting: Nurse Practitioner

## 2023-08-11 ENCOUNTER — Other Ambulatory Visit: Payer: Self-pay | Admitting: Nurse Practitioner

## 2023-08-13 ENCOUNTER — Other Ambulatory Visit: Payer: Self-pay | Admitting: Nurse Practitioner

## 2023-08-14 ENCOUNTER — Other Ambulatory Visit: Payer: Self-pay

## 2023-08-14 ENCOUNTER — Telehealth: Payer: Self-pay | Admitting: Nurse Practitioner

## 2023-08-14 ENCOUNTER — Encounter: Payer: Self-pay | Admitting: Nurse Practitioner

## 2023-08-14 DIAGNOSIS — J069 Acute upper respiratory infection, unspecified: Secondary | ICD-10-CM | POA: Insufficient documentation

## 2023-08-14 DIAGNOSIS — R051 Acute cough: Secondary | ICD-10-CM | POA: Insufficient documentation

## 2023-08-14 NOTE — Telephone Encounter (Signed)
 Copied from CRM [id number]. Topic: Clinical - Medication Refill >> Aug 14, 2023 10:10 AM Winnifred Havers wrote: Most Recent Primary Care Visit:  Provider: [name]  Department: [facility]  Visit Type: ACUTE  Date: 08/02/2023  Medication: Hydrochlorothiazide .25 mg  Has the patient contacted their pharmacy? Yes (Agent: If no, request that the patient contact the pharmacy for the refill. If patient does not wish to contact the pharmacy document the reason why and proceed with request.) (Agent: If yes, when and what did the pharmacy advise?)  Is this the correct pharmacy for this prescription? Yes If no, delete pharmacy and type the correct one.  This is the patient's preferred pharmacy:  [facility] [address], [place] - [address] [place] [address] Phone: [phone] Fax: [phone]   Has the prescription been filled recently? Yes  Is the patient out of the medication? Yes  Has the patient been seen for an appointment in the last year OR does the patient have an upcoming appointment? Yes  Can we respond through MyChart? Yes  Agent: Please be advised that Rx refills may take up to 3 business days. We ask that you follow-up with your pharmacy.

## 2023-08-17 ENCOUNTER — Other Ambulatory Visit: Payer: Self-pay | Admitting: Nurse Practitioner

## 2023-08-17 NOTE — Telephone Encounter (Unsigned)
 Copied from CRM [id number]. Topic: Clinical - Prescription Issue >> Aug 17, 2023 12:17 PM Baldomero Bone wrote: Reason for CRM: Patient is calling to check the status of her refill for Hydrochlorothiazide (HCTZ)  25 mg. The medication is not showing on her current or expired list. Patient is down to 1 pill. Please advise if she is no longer supposed to be taking the medication. Callback number is [phone].

## 2023-08-21 ENCOUNTER — Other Ambulatory Visit: Payer: Self-pay | Admitting: Nurse Practitioner

## 2023-08-21 NOTE — Telephone Encounter (Signed)
 Copied from CRM [id number]. Topic: Clinical - Medication Refill >> Aug 21, 2023  8:30 AM Marlan Silva wrote: Most Recent Primary Care Visit:  Provider: [name]  Department: [facility]  Visit Type: ACUTE  Date: 08/02/2023  Medication: Hydrochlorothiazide  .25 mg  Has the patient contacted their pharmacy? Yes (Agent: If no, request that the patient contact the pharmacy for the refill. If patient does not wish to contact the pharmacy document the reason why and proceed with request.) (Agent: If yes, when and what did the pharmacy advise?)  Is this the correct pharmacy for this prescription? Yes If no, delete pharmacy and type the correct one.  This is the patient's preferred pharmacy:  [facility] [address] [place], [place] - [address] [place] [address] Phone: [phone] Fax: [phone]   Has the prescription been filled recently? No  Is the patient out of the medication? Yes  Has the patient been seen for an appointment in the last year OR does the patient have an upcoming appointment? Yes  Can we respond through MyChart? Yes  Agent: Please be advised that Rx refills may take up to 3 business days. We ask that you follow-up with your pharmacy.

## 2023-08-22 ENCOUNTER — Ambulatory Visit: Payer: Self-pay | Admitting: Nurse Practitioner

## 2023-09-04 ENCOUNTER — Encounter: Payer: Self-pay | Admitting: Nurse Practitioner

## 2023-09-04 ENCOUNTER — Ambulatory Visit (INDEPENDENT_AMBULATORY_CARE_PROVIDER_SITE_OTHER): Payer: Self-pay | Admitting: Nurse Practitioner

## 2023-09-04 VITALS — Temp 98.5°F | Ht 63.0 in | Wt 249.6 lb

## 2023-09-04 DIAGNOSIS — E669 Obesity, unspecified: Secondary | ICD-10-CM

## 2023-09-04 DIAGNOSIS — Z2821 Immunization not carried out because of patient refusal: Secondary | ICD-10-CM | POA: Diagnosis not present

## 2023-09-04 DIAGNOSIS — I272 Pulmonary hypertension, unspecified: Secondary | ICD-10-CM | POA: Diagnosis not present

## 2023-09-04 DIAGNOSIS — E1169 Type 2 diabetes mellitus with other specified complication: Secondary | ICD-10-CM | POA: Diagnosis not present

## 2023-09-04 DIAGNOSIS — Z6841 Body Mass Index (BMI) 40.0 and over, adult: Secondary | ICD-10-CM

## 2023-09-04 DIAGNOSIS — E782 Mixed hyperlipidemia: Secondary | ICD-10-CM | POA: Diagnosis not present

## 2023-09-04 MED ORDER — HYDROCHLOROTHIAZIDE 25 MG PO TABS: 25.0000 mg | ORAL_TABLET | Freq: Every day | ORAL | 1 refills | Status: DC

## 2023-09-04 NOTE — Progress Notes (Signed)
 [place], CMA,acting as a Neurosurgeon for [name], FNP.,have documented all relevant documentation on the behalf of [name], FNP,as directed by  [name], FNP while in the presence of [name], FNP.  Subjective:  Patient ID: Rhonda [name] , female    DOB: February 08, 1951 , 73 y.o.   MRN: [id number]  Chief Complaint  Patient presents with   Hypertension    Patient presents today for a bp and dm follow up, Patient reports compliance with medication. Patient denies any chest pain, SOB, or headaches. Patient has no concerns today.    Dizziness    Patient reports sometimes when she wakes up in the morning she is dizzy. She has noticed since her mom passing away she states it could be due to stress.     HPI  She is feeling dizzy intermittently. She is drinking 3- 16 oz bottles of water a day. Dizziness is mostly in the morning. She recently lost her mother and is under increased stress. She also began having swelling to her left lower extremity.   Hypertension This is a chronic problem. The current episode started more than 1 year ago. The problem is unchanged. The problem is controlled. Pertinent negatives include no anxiety, blurred vision or shortness of breath. There are no associated agents to hypertension. Risk factors for coronary artery disease include obesity and sedentary lifestyle. Past treatments include beta blockers. The current treatment provides moderate improvement. Compliance problems include exercise.  There is no history of chronic renal disease.     Past Medical History:  Diagnosis Date   Anemia    hx none since menopause   Arthritis    "maybe in my legs" (06/15/2016)   Asthma 2018   none since   Complication of anesthesia    woke up during colonoscopy 03-31-16 Dr. Teddie Favre   GERD (gastroesophageal reflux disease)    History of hiatal hernia    Hyperlipemia    Hypertension    Hypokalemia due to excessive gastrointestinal loss of potassium 12/02/2019    Pre-diabetes 2019   Thyroid  nodule 2010   "no problems w/them since; on a pill for a while" (06/15/2016)     Family History  Problem Relation Age of Onset   COPD Father    Breast cancer Neg Hx      Current Outpatient Medications:    acetaminophen  (TYLENOL ) 650 MG CR tablet, Take 650 mg by mouth at bedtime as needed for pain., Disp: , Rfl:    albuterol  (VENTOLIN  HFA) 108 (90 Base) MCG/ACT inhaler, Inhale 2 puffs into the lungs every 6 (six) hours as needed for wheezing or shortness of breath., Disp: 8 g, Rfl: 2   Albuterol -Budesonide (AIRSUPRA ) 90-80 MCG/ACT AERO, Inhale 2 puffs into the lungs every 6 (six) hours as needed., Disp: 32.1 g, Rfl: 2   benzonatate  (TESSALON  PERLES) 100 MG capsule, Take 1 capsule (100 mg total) by mouth every 6 (six) hours as needed., Disp: 30 capsule, Rfl: 0   Blood Glucose Monitoring Suppl DEVI, 1 each by Does not apply route in the morning, at noon, and at bedtime. May substitute to any manufacturer covered by patient's insurance., Disp: 1 each, Rfl: 0   Blood Pressure Monitoring (BLOOD PRESSURE CUFF) MISC, 1 each by Does not apply route daily., Disp: 1 each, Rfl: 0   Cholecalciferol (VITAMIN D3 PO), Take 1 tablet by mouth daily., Disp: , Rfl:    dorzolamide -timolol  (COSOPT ) 22.3-6.8 MG/ML ophthalmic solution, Place 1 drop into both eyes 2 (two)  times daily. , Disp: , Rfl:    levocetirizine (XYZAL ) 5 MG tablet, Take 1 tablet (5 mg total) by mouth 2 (two) times daily as needed for allergies (Can use an dose during flare ups.)., Disp: 60 tablet, Rfl: 5   lidocaine  (LIDODERM ) 5 %, Place 1 patch onto the skin daily. Remove & Discard patch within 12 hours or as directed by MD, Disp: 30 patch, Rfl: 0   loperamide  (IMODIUM ) 2 MG capsule, Take 1 capsule (2 mg total) by mouth every 6 (six) hours as needed for diarrhea or loose stools., Disp: 30 capsule, Rfl: 0   Magnesium  250 MG TABS, Take 1 tablet (250 mg total) by mouth daily., Disp: 30 tablet, Rfl: 0   metoprolol   tartrate (LOPRESSOR ) 25 MG tablet, Take 1 tablet by mouth twice daily, Disp: 180 tablet, Rfl: 3   Multiple Vitamin (MULTIVITAMIN WITH MINERALS) TABS tablet, Take 1 tablet by mouth daily., Disp: , Rfl:    ondansetron  (ZOFRAN ) 4 MG tablet, Take 1 tablet (4 mg total) by mouth every 8 (eight) hours as needed for nausea or vomiting., Disp: 20 tablet, Rfl: 0   potassium chloride  (KLOR-CON ) 10 MEQ tablet, Take 1 tablet by mouth once daily (Patient taking differently: Take 10 mEq by mouth daily. Take with furosemide ), Disp: 90 tablet, Rfl: 0   rosuvastatin  (CRESTOR ) 40 MG tablet, TAKE 1 TABLET BY MOUTH ONCE DAILY PLEASE  KEEP  SCHEDULED  APPOINTMENT  FOR  FUTURE  REFILLS, Disp: 30 tablet, Rfl: 10   hydrochlorothiazide  (HYDRODIURIL ) 25 MG tablet, Take 1 tablet (25 mg total) by mouth daily., Disp: 90 tablet, Rfl: 1   Allergies  Allergen Reactions   Oxycontin  [Oxycodone  Hcl] Other (See Comments)    Hallucination    Aspirin -Acetaminophen -Caffeine Nausea And Vomiting   Codeine Diarrhea and Nausea And Vomiting    Stomach cramps      Review of Systems  Constitutional: Negative.   HENT:  Negative for congestion, sinus pressure, sinus pain and sore throat.   Eyes: Negative.  Negative for blurred vision.  Respiratory: Negative.  Negative for shortness of breath and wheezing.   Cardiovascular: Negative.   Gastrointestinal: Negative.   Musculoskeletal: Negative.   Neurological: Negative.   Psychiatric/Behavioral: Negative.       Today's Vitals   09/04/23 1558  Temp: 98.5 F (36.9 C)  TempSrc: Oral  Weight: 249 lb 9.6 oz (113.2 kg)  Height: 5\' 3"  (1.6 m)  PainSc: 0-No pain   Body mass index is 44.21 kg/m.  Wt Readings from Last 3 Encounters:  09/04/23 249 lb 9.6 oz (113.2 kg)  08/02/23 231 lb 9.6 oz (105.1 kg)  07/06/23 238 lb (108 kg)    No data found.    Objective:  Physical Exam Vitals reviewed.  Constitutional:      Appearance: Normal appearance.  HENT:     Right Ear: Tympanic  membrane, ear canal and external ear normal. There is no impacted cerumen.     Left Ear: Tympanic membrane, ear canal and external ear normal. There is no impacted cerumen.     Nose: Nose normal.     Mouth/Throat:     Mouth: Mucous membranes are moist.  Cardiovascular:     Rate and Rhythm: Normal rate and regular rhythm.     Pulses: Normal pulses.     Heart sounds: Normal heart sounds. No murmur heard. Pulmonary:     Effort: Pulmonary effort is normal. No respiratory distress.     Breath sounds: Normal breath sounds. No  wheezing.  Neurological:     General: No focal deficit present.     Mental Status: She is alert and oriented to person, place, and time.     Cranial Nerves: No cranial nerve deficit.     Motor: No weakness.  Psychiatric:        Mood and Affect: Mood normal.        Behavior: Behavior normal.        Thought Content: Thought content normal.        Judgment: Judgment normal.         Assessment And Plan:  Pulmonary hypertension (HCC) Assessment & Plan: Blood pressure is increased slightly, continue current medications. Will get her started back on her hydrochlorothiazide . She will return in 2 weeks for a BMP  Orders: -     BMP8+eGFR -     hydroCHLOROthiazide ; Take 1 tablet (25 mg total) by mouth daily.  Dispense: 90 tablet; Refill: 1  Type 2 diabetes mellitus with obesity (HCC) Assessment & Plan: HgbA1c is stable, continue current medications  Orders: -     Hemoglobin A1c  Mixed hyperlipidemia Assessment & Plan: Cholesterol levels are stable, continue current medications  Orders: -     Lipid panel  COVID-19 vaccination declined Assessment & Plan: Declines covid 19 vaccine. Discussed risk of covid 47 and if she changes her mind about the vaccine to call the office. Education has been provided regarding the importance of this vaccine but patient still declined. Advised may receive this vaccine at local pharmacy or Health Dept.or vaccine clinic. Aware to  provide a copy of the vaccination record if obtained from local pharmacy or Health Dept.  Encouraged to take multivitamin, vitamin d, vitamin c and zinc to increase immune system. Aware can call office if would like to have vaccine here at office. Verbalized acceptance and understanding.    BMI 40.0-44.9, adult [facility]) Assessment & Plan: She is encouraged to strive for BMI less than 30 to decrease cardiac risk. Advised to aim for at least 150 minutes of exercise per week.    Morbid obesity (HCC) Assessment & Plan: Encouraged to focus on healthy lifestyle and regular exercise as tolerated.      Return for controlled DM check 4 months; lab visit 2 weeks for BMP with eGFR.  Patient was given opportunity to ask questions. Patient verbalized understanding of the plan and was able to repeat key elements of the plan. All questions were answered to their satisfaction.    Inge Mangle, FNP, have reviewed all documentation for this visit. The documentation on 09/04/23 for the exam, diagnosis, procedures, and orders are all accurate and complete.   IF YOU HAVE BEEN REFERRED TO A SPECIALIST, IT MAY TAKE 1-2 WEEKS TO SCHEDULE/PROCESS THE REFERRAL. IF YOU HAVE NOT HEARD FROM US /SPECIALIST IN TWO WEEKS, PLEASE GIVE US  A CALL AT [phone].

## 2023-09-05 LAB — BMP8+EGFR
BUN/Creatinine Ratio: 24 (ref 12–28)
BUN: 20 mg/dL (ref 8–27)
CO2: 23 mmol/L (ref 20–29)
Calcium: 9.9 mg/dL (ref 8.7–10.3)
Chloride: 103 mmol/L (ref 96–106)
Creatinine, Ser: 0.82 mg/dL (ref 0.57–1.00)
Glucose: 82 mg/dL (ref 70–99)
Potassium: 4.1 mmol/L (ref 3.5–5.2)
Sodium: 141 mmol/L (ref 134–144)
eGFR: 75 mL/min/{1.73_m2} (ref >59)

## 2023-09-05 LAB — LIPID PANEL
Chol/HDL Ratio: 3.1 ratio (ref 0.0–4.4)
Cholesterol, Total: 191 mg/dL (ref 100–199)
HDL: 62 mg/dL (ref >39)
LDL Chol Calc (NIH): 116 mg/dL — ABNORMAL HIGH (ref 0–99)
Triglycerides: 69 mg/dL (ref 0–149)
VLDL Cholesterol Cal: 13 mg/dL (ref 5–40)

## 2023-09-05 LAB — HEMOGLOBIN A1C
Est. average glucose Bld gHb Est-mCnc: 126 mg/dL
Hgb A1c MFr Bld: 6 % — ABNORMAL HIGH (ref 4.8–5.6)

## 2023-09-10 ENCOUNTER — Encounter: Payer: Self-pay | Admitting: Nurse Practitioner

## 2023-09-10 DIAGNOSIS — Z6841 Body Mass Index (BMI) 40.0 and over, adult: Secondary | ICD-10-CM | POA: Insufficient documentation

## 2023-09-10 NOTE — Assessment & Plan Note (Signed)
HgbA1c is stable, continue current medications

## 2023-09-10 NOTE — Assessment & Plan Note (Signed)
 She is encouraged to strive for BMI less than 30 to decrease cardiac risk. Advised to aim for at least 150 minutes of exercise per week.

## 2023-09-10 NOTE — Assessment & Plan Note (Signed)

## 2023-09-10 NOTE — Assessment & Plan Note (Addendum)
 Blood pressure is increased slightly, continue current medications. Will get her started back on her hydrochlorothiazide . She will return in 2 weeks for a BMP

## 2023-09-10 NOTE — Assessment & Plan Note (Signed)
 Cholesterol levels are stable, continue current medications.

## 2023-09-13 NOTE — Assessment & Plan Note (Signed)
 Encouraged to focus on healthy lifestyle and regular exercise as tolerated.

## 2023-09-13 NOTE — Progress Notes (Signed)
 Done

## 2023-09-18 ENCOUNTER — Other Ambulatory Visit

## 2023-09-20 ENCOUNTER — Other Ambulatory Visit

## 2023-09-20 DIAGNOSIS — I272 Pulmonary hypertension, unspecified: Secondary | ICD-10-CM

## 2023-09-20 LAB — BMP8+EGFR
BUN/Creatinine Ratio: 21 (ref 12–28)
BUN: 18 mg/dL (ref 8–27)
CO2: 24 mmol/L (ref 20–29)
Calcium: 9.7 mg/dL (ref 8.7–10.3)
Chloride: 100 mmol/L (ref 96–106)
Creatinine, Ser: 0.87 mg/dL (ref 0.57–1.00)
Glucose: 99 mg/dL (ref 70–99)
Potassium: 4.1 mmol/L (ref 3.5–5.2)
Sodium: 140 mmol/L (ref 134–144)
eGFR: 70 mL/min/{1.73_m2} (ref >59)

## 2023-09-22 ENCOUNTER — Other Ambulatory Visit: Payer: Self-pay | Admitting: Nurse Practitioner

## 2023-10-11 ENCOUNTER — Other Ambulatory Visit: Payer: Self-pay | Admitting: Internal Medicine

## 2023-11-15 ENCOUNTER — Ambulatory Visit (INDEPENDENT_AMBULATORY_CARE_PROVIDER_SITE_OTHER): Admitting: Family Medicine

## 2023-11-15 ENCOUNTER — Ambulatory Visit: Payer: Self-pay

## 2023-11-15 ENCOUNTER — Encounter: Payer: Self-pay | Admitting: Family Medicine

## 2023-11-15 VITALS — BP 130/78 | HR 80 | Temp 97.6°F | Ht 63.0 in | Wt 241.0 lb

## 2023-11-15 DIAGNOSIS — Z6841 Body Mass Index (BMI) 40.0 and over, adult: Secondary | ICD-10-CM | POA: Diagnosis not present

## 2023-11-15 DIAGNOSIS — J309 Allergic rhinitis, unspecified: Secondary | ICD-10-CM | POA: Diagnosis not present

## 2023-11-15 DIAGNOSIS — E66813 Obesity, class 3: Secondary | ICD-10-CM

## 2023-11-15 DIAGNOSIS — J3489 Other specified disorders of nose and nasal sinuses: Secondary | ICD-10-CM

## 2023-11-15 DIAGNOSIS — H903 Sensorineural hearing loss, bilateral: Secondary | ICD-10-CM

## 2023-11-15 DIAGNOSIS — H9203 Otalgia, bilateral: Secondary | ICD-10-CM

## 2023-11-15 MED ORDER — TRIAMCINOLONE ACETONIDE 40 MG/ML IJ SUSP
60.0000 mg | Freq: Once | INTRAMUSCULAR | Status: AC
  Administered 2023-11-15: 60 mg via INTRAMUSCULAR

## 2023-11-15 MED ORDER — LEVOCETIRIZINE DIHYDROCHLORIDE 5 MG PO TABS
5.0000 mg | ORAL_TABLET | ORAL | 5 refills | Status: DC | PRN
Start: 2023-11-15 — End: 2024-01-02

## 2023-11-15 NOTE — Telephone Encounter (Signed)
 FYI Only or Action Required?: FYI only for provider.  Patient was last seen in primary care on 09/04/2023 by Georgina Speaks, FNP.  Called Nurse Triage reporting Facial Pain.  Symptoms began yesterday.  Interventions attempted: Nothing.  Symptoms are: gradually worsening.  Triage Disposition: No disposition on file.  Patient/caregiver understands and will follow disposition?:     Copied from CRM [id number]. Topic: Clinical - Red Word Triage >> Nov 15, 2023  8:24 AM Marissa P wrote: Red Word that prompted transfer to Nurse Triage: Patient called ears and throats are stocked up and causing pain. Believes she has a sinus infection, would like to be seen as soon possible please. Reason for Disposition  [1] SEVERE sinus pain (e.g., excruciating) AND [2] not improved 2 hours after pain medicine  Answer Assessment - Initial Assessment Questions 1. LOCATION: Where does it hurt?      Forehead and eyes 2. ONSET: When did the sinus pain start?  (e.g., hours, days)      yesterday 3. SEVERITY: How bad is the pain?   (Scale 0-10; or none, mild, moderate or severe)     severe 4. RECURRENT SYMPTOM: Have you ever had sinus problems before? If Yes, ask: When was the last time? and What happened that time?      Yes, often 5. NASAL CONGESTION: Is the nose blocked? If Yes, ask: Can you open it or must you breathe through your mouth?     runny 6. NASAL DISCHARGE: Do you have discharge from your nose? If so ask, What color?     clear 7. FEVER: Do you have a fever? If Yes, ask: What is it, how was it measured, and when did it start?      no 8. OTHER SYMPTOMS: Do you have any other symptoms? (e.g., sore throat, cough, earache, difficulty breathing)     Sore throat, sore shoulder on left side 9. PREGNANCY: Is there any chance you are pregnant? When was your last menstrual period?     na  Protocols used: Sinus Pain or Congestion-A-AH

## 2023-11-15 NOTE — Progress Notes (Signed)
 I,[name], CMA,acting as a Neurosurgeon for [employer], NP.,have documented all relevant documentation on the behalf of [name], NP,as directed by  [name], NP while in the presence of [name], NP.  Subjective:  Patient ID: Rhonda [name] , female    DOB: 1950-05-24 , 73 y.o.   MRN: [id number]  Chief Complaint  Patient presents with   URI    HPI  Patient is a 73 year old who presents today for upper respiratory symptoms possibly due to allergies she reports she has been feeling some sinus pain and pressure for about 7 days weeks ago with sore throat and some pain in her ear.  Sore throat and ear pain.  She denies any body aches or fever, no cough just some runny nose with clear drainage.  She states that her condition has not gotten any worse or gotten any better and she denies any fevers or chills.  She states that she has tried decongestants but she did not take her antihistamine.   Patient states that she has been having pain in her left leg,denies any trauma, injury. No swelling or redness noted.  States it has been ongoing for over a month, advised to use over-the-counter analgesics and monitor to further notice.     Past Medical History:  Diagnosis Date   Anemia    hx none since menopause   Arthritis    maybe in my legs (06/15/2016)   Asthma 2018   none since   Complication of anesthesia    woke up during colonoscopy 03-31-16 Dr. Timmothy   GERD (gastroesophageal reflux disease)    History of hiatal hernia    Hyperlipemia    Hypertension    Hypokalemia due to excessive gastrointestinal loss of potassium 12/02/2019   Pre-diabetes 2019   Thyroid  nodule 2010   no problems w/them since; on a pill for a while (06/15/2016)     Family History  Problem Relation Age of Onset   COPD Father    Breast cancer Neg Hx      Current Outpatient Medications:    acetaminophen  (TYLENOL ) 650 MG CR tablet, Take 650 mg by mouth at bedtime as needed for pain., Disp: ,  Rfl:    albuterol  (VENTOLIN  HFA) 108 (90 Base) MCG/ACT inhaler, Inhale 2 puffs into the lungs every 6 (six) hours as needed for wheezing or shortness of breath., Disp: 8 g, Rfl: 2   Albuterol -Budesonide (AIRSUPRA ) 90-80 MCG/ACT AERO, Inhale 2 puffs into the lungs every 6 (six) hours as needed., Disp: 32.1 g, Rfl: 2   benzonatate  (TESSALON  PERLES) 100 MG capsule, Take 1 capsule (100 mg total) by mouth every 6 (six) hours as needed., Disp: 30 capsule, Rfl: 0   Blood Glucose Monitoring Suppl DEVI, 1 each by Does not apply route in the morning, at noon, and at bedtime. May substitute to any manufacturer covered by patient's insurance., Disp: 1 each, Rfl: 0   Blood Pressure Monitoring (BLOOD PRESSURE CUFF) MISC, 1 each by Does not apply route daily., Disp: 1 each, Rfl: 0   Cholecalciferol (VITAMIN D3 PO), Take 1 tablet by mouth daily., Disp: , Rfl:    dorzolamide -timolol  (COSOPT ) 22.3-6.8 MG/ML ophthalmic solution, Place 1 drop into both eyes 2 (two) times daily. , Disp: , Rfl:    fluticasone  (FLONASE ) 50 MCG/ACT nasal spray, Use 1 spray(s) in each nostril once daily, Disp: 16 g, Rfl: 0   hydrochlorothiazide  (HYDRODIURIL ) 25 MG tablet, Take 1 tablet (25 mg total) by mouth  daily., Disp: 90 tablet, Rfl: 1   lidocaine  (LIDODERM ) 5 %, Place 1 patch onto the skin daily. Remove & Discard patch within 12 hours or as directed by MD, Disp: 30 patch, Rfl: 0   loperamide  (IMODIUM ) 2 MG capsule, Take 1 capsule (2 mg total) by mouth every 6 (six) hours as needed for diarrhea or loose stools., Disp: 30 capsule, Rfl: 0   Magnesium  250 MG TABS, Take 1 tablet (250 mg total) by mouth daily., Disp: 30 tablet, Rfl: 0   metoprolol  tartrate (LOPRESSOR ) 25 MG tablet, Take 1 tablet by mouth twice daily, Disp: 180 tablet, Rfl: 3   Multiple Vitamin (MULTIVITAMIN WITH MINERALS) TABS tablet, Take 1 tablet by mouth daily., Disp: , Rfl:    ondansetron  (ZOFRAN ) 4 MG tablet, Take 1 tablet (4 mg total) by mouth every 8 (eight) hours as  needed for nausea or vomiting., Disp: 20 tablet, Rfl: 0   potassium chloride  (KLOR-CON ) 10 MEQ tablet, Take 1 tablet (10 mEq total) by mouth daily. Take with furosemide , Disp: 90 tablet, Rfl: 2   azithromycin  (ZITHROMAX ) 250 MG tablet, Take 2 tabs PO x 1 dose, then 1 tab PO QD x 4 days, Disp: 6 tablet, Rfl: 0   furosemide  (LASIX ) 20 MG tablet, Take 20 mg by mouth daily as needed., Disp: , Rfl:    levocetirizine (XYZAL ) 5 MG tablet, Take 1 tablet (5 mg total) by mouth as needed for allergies (Can use an dose during flare ups.)., Disp: 30 tablet, Rfl: 5   losartan  (COZAAR ) 100 MG tablet, Take 100 mg by mouth daily., Disp: , Rfl:    rosuvastatin  (CRESTOR ) 40 MG tablet, Take 1 tablet (40 mg total) by mouth daily. Please call [phone] to schedule an appointment for future refills. Thank you., Disp: 30 tablet, Rfl: 0   Allergies  Allergen Reactions   Oxycontin  [Oxycodone  Hcl] Other (See Comments)    Hallucination    Aspirin -Acetaminophen -Caffeine Nausea And Vomiting   Codeine Diarrhea and Nausea And Vomiting    Stomach cramps      Review of Systems  Constitutional:  Negative for chills and fever.  HENT:  Positive for ear pain and postnasal drip.   Respiratory: Negative.  Negative for shortness of breath and wheezing.   Cardiovascular: Negative.   Musculoskeletal:  Positive for arthralgias.  Skin: Negative.   Psychiatric/Behavioral: Negative.       Today's Vitals   11/15/23 1201  BP: 130/78  Pulse: 80  Temp: 97.6 F (36.4 C)  TempSrc: Oral  Weight: 241 lb (109.3 kg)  Height: 5' 3 (1.6 m)  PainSc: 0-No pain   Body mass index is 42.69 kg/m.  Wt Readings from Last 3 Encounters:  11/15/23 241 lb (109.3 kg)  09/04/23 249 lb 9.6 oz (113.2 kg)  08/02/23 231 lb 9.6 oz (105.1 kg)    The ASCVD Risk score (Arnett DK, et al., 2019) failed to calculate for the following reasons:   Risk score cannot be calculated because patient has a medical history suggesting prior/existing  ASCVD  Objective:  Physical Exam HENT:     Head: Normocephalic.     Right Ear: Tympanic membrane normal. No drainage or swelling.     Left Ear: Tympanic membrane normal. No drainage or swelling.  Cardiovascular:     Rate and Rhythm: Normal rate.  Pulmonary:     Effort: Pulmonary effort is normal.  Neurological:     Mental Status: She is alert.         Assessment And  Plan:  Allergic rhinitis, unspecified seasonality, unspecified trigger -     Levocetirizine Dihydrochloride ; Take 1 tablet (5 mg total) by mouth as needed for allergies (Can use an dose during flare ups.).  Dispense: 30 tablet; Refill: 5 -     Triamcinolone  Acetonide  Class 3 severe obesity due to excess calories with serious comorbidity and body mass index (BMI) of 40.0 to 44.9 in adult Assessment & Plan: She is encouraged to strive for BMI less than 30 to decrease cardiac risk. Advised to aim for at least 150 minutes of exercise per week.      Return if symptoms worsen or fail to improve, for keep appt.  Patient was given opportunity to ask questions. Patient verbalized understanding of the plan and was able to repeat key elements of the plan. All questions were answered to their satisfaction.    I, [name], NP, have reviewed all documentation for this visit. The documentation on 11/27/2023 for the exam, diagnosis, procedures, and orders are all accurate and complete.   IF YOU HAVE BEEN REFERRED TO A SPECIALIST, IT MAY TAKE 1-2 WEEKS TO SCHEDULE/PROCESS THE REFERRAL. IF YOU HAVE NOT HEARD FROM US /SPECIALIST IN TWO WEEKS, PLEASE GIVE US  A CALL AT [phone].

## 2023-11-17 ENCOUNTER — Ambulatory Visit: Payer: Self-pay

## 2023-11-17 ENCOUNTER — Ambulatory Visit
Admission: EM | Admit: 2023-11-17 | Discharge: 2023-11-17 | Disposition: A | Discharge status: Home / Self Care | Attending: Family Medicine | Admitting: Family Medicine

## 2023-11-17 ENCOUNTER — Other Ambulatory Visit: Payer: Self-pay

## 2023-11-17 ENCOUNTER — Encounter: Payer: Self-pay | Admitting: *Deleted

## 2023-11-17 DIAGNOSIS — H6691 Otitis media, unspecified, right ear: Secondary | ICD-10-CM

## 2023-11-17 DIAGNOSIS — R42 Dizziness and giddiness: Secondary | ICD-10-CM | POA: Diagnosis not present

## 2023-11-17 DIAGNOSIS — J0140 Acute pansinusitis, unspecified: Secondary | ICD-10-CM

## 2023-11-17 DIAGNOSIS — H6993 Unspecified Eustachian tube disorder, bilateral: Secondary | ICD-10-CM | POA: Diagnosis not present

## 2023-11-17 LAB — POCT URINALYSIS DIP (MANUAL ENTRY)
Bilirubin, UA: NEGATIVE
Blood, UA: NEGATIVE
Glucose, UA: NEGATIVE mg/dL
Ketones, POC UA: NEGATIVE mg/dL
Leukocytes, UA: NEGATIVE
Nitrite, UA: NEGATIVE
Protein Ur, POC: NEGATIVE mg/dL
Spec Grav, UA: 1.015 (ref 1.010–1.025)
Urobilinogen, UA: 0.2 U/dL
pH, UA: 6.5 (ref 5.0–8.0)

## 2023-11-17 LAB — POCT FASTING CBG KUC MANUAL ENTRY: POCT Glucose (KUC): 104 mg/dL — AB (ref 70–99)

## 2023-11-17 LAB — POC SARS CORONAVIRUS 2 AG -  ED: SARS Coronavirus 2 Ag: NEGATIVE

## 2023-11-17 MED ORDER — AZITHROMYCIN 250 MG PO TABS: ORAL_TABLET | ORAL | 0 refills | Status: DC

## 2023-11-17 MED ORDER — PREDNISONE 10 MG PO TABS: 10.0000 mg | ORAL_TABLET | Freq: Two times a day (BID) | ORAL | 0 refills | Status: AC

## 2023-11-17 NOTE — Telephone Encounter (Signed)
 FYI Only or Action Required?: Action required by provider: request for appointment.  Patient was last seen in primary care on 11/15/2023 by Petrina Pries, NP.  Called Nurse Triage reporting Sore Throat.  Symptoms began yesterday.  Interventions attempted: Prescription medications: Zyxol.  Symptoms are: gradually worsening.  Triage Disposition: See Physician Within 24 Hours  Patient/caregiver understands and will follow disposition?: Yes   Copied from CRM [id number]. Topic: Clinical - Red Word Triage >> Nov 17, 2023  8:06 AM Marissa P wrote: Red Word that prompted transfer to Nurse Triage: Patient is having some throat pain started the middle of the night. Needs some help please. Reason for Disposition  Earache also present  Answer Assessment - Initial Assessment Questions 1. ONSET: When did the throat start hurting? (Hours or days ago)      Overnight  2. SEVERITY: How bad is the sore throat? (Scale 1-10; mild, moderate or severe)     Moderate to Severe  3. STREP EXPOSURE: Has there been any exposure to strep within the past week? If Yes, ask: What type of contact occurred?      No  4.  VIRAL SYMPTOMS: Are there any symptoms of a cold, such as a runny nose, cough, hoarse voice or red eyes?      Runny Nose  5. FEVER: Do you have a fever? If Yes, ask: What is your temperature, how was it measured, and when did it start?     No  6. PUS ON THE TONSILS: Is there pus on the tonsils in the back of your throat?     Unsure  7. OTHER SYMPTOMS: Do you have any other symptoms? (e.g., difficulty breathing, headache, rash)     Dizziness, Earache  8. PREGNANCY: Is there any chance you are pregnant? When was your last menstrual period?     No and No  Protocols used: Sore Throat-A-AH
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
## 2023-11-17 NOTE — Discharge Instructions (Signed)
-   Hold Xyzal . - You are being treated for sinus infection with azithromycin  and prednisone  10 mg twice daily for 5 days to help with her in her ear congestion and throat soreness. -Hydrate well with fluids.  Your blood pressure and blood sugar are stable here in clinic. -Symptoms should gradually improve over the next 5 to 7 days.  If symptoms worsen or if your dizziness worsens and you have any weakness, or changes in vision go immediately to the emergency department.  Follow up with your primary care doctor in 2 to 3 weeks to ensure that all of the respiratory symptoms have resolved.

## 2023-11-17 NOTE — ED Triage Notes (Signed)
 Pt reports dizziness since Tuesday. She saw her PCP on Wednesday and was started on xyzal . Today she c/o feeling very dizzy like I might faint.-, runny nose, sore throat, and ear pain. She had a shot of steroids on Wed at the PCP. States her blood sugar at home was 95- she has not eaten yet.

## 2023-11-23 ENCOUNTER — Other Ambulatory Visit: Payer: Self-pay | Admitting: Internal Medicine

## 2023-11-23 DIAGNOSIS — Z79899 Other long term (current) drug therapy: Secondary | ICD-10-CM

## 2023-11-23 DIAGNOSIS — E785 Hyperlipidemia, unspecified: Secondary | ICD-10-CM

## 2023-11-26 NOTE — Patient Instructions (Incomplete)
  1.  Treat inflammation of airway:  Wixela 250 - 1 inhalation 2 times per day  2.  To treat nasal inflammation and infection Augmentin  875 mg twice a day for 7 days Levocetirizine 5 mg - 1 tablet 1 time per day Flonase  - 1-2 sprays each nostril daily.  In the right nostril, point the applicator out toward the right ear. In the left nostril, point the applicator out toward the left ear Begin Nasal saline rinses before Flonase   Begin Mucinex [phone] twice a day  3. If needed:  Albuterol  HFA - 2 inhalations every 4-6 hours  4. Follow up with your primary care provider if your dizziness does not improve after taking the antibiotic for sinus infection  Call the clinic if this treatment plan is not working well for you  Follow up in 2 months or sooner if needed.

## 2023-11-26 NOTE — Progress Notes (Unsigned)
 [address] [place] [address] Dept: [phone]  FOLLOW UP NOTE  Patient ID: Rhonda [name], female    DOB: October 29, 1950  Age: 73 y.o. MRN: [id number] Date of Office Visit: 11/27/2023  Assessment  Chief Complaint: Other (States that she may have sinus infection c/o sore throat runny nose earache in both ears x 3 weeks)  HPI Rhonda [name] is a 74 year old female who presents to the clinic for a follow up visit. She was last seen in this clinic for evaluation of asthma and allergic rhinitis.  In the interim, she visited her primary care provider for evaluation of upper respiratory symptoms on 11/15/2023 and an antihistamine and steroid injection were given at that time.  She visited urgent care on 11/17/2023 for evaluation of possible sinus infection with bilateral ear pain and sore throat for 5 days for which she received azithromycin  and oral prednisone  with only temporary relief of symptoms.   At today's visit, she reports that her asthma has been moderately well-controlled with cough producing clear mucus as the main symptom.  She denies shortness of breath or wheeze with activity or rest.  She continues Wixela 250-1 puff twice a day and rarely uses albuterol  for relief of symptoms.  Allergic rhinitis is reported as poorly controlled with symptoms including nasal congestion, headache, and sore throat that began about 3 weeks ago.  She denies fever, sweats, chills, or sick contacts.  She has had 2 negative COVID tests within the last several weeks.  She continues Flonase  nasal spray at this time.  She is not currently using nasal saline rinses or taking an antihistamine.  She reports poor application of Flonase  nasal spray.  She does report some dizziness which is worse in the morning upon rising.  Her last environmental allergy skin testing in 1998 was positive to dust mite.  Her current medications are listed in the chart.  Drug Allergies:  Allergies  Allergen Reactions    Oxycontin  [Oxycodone  Hcl] Other (See Comments)    Hallucination    Aspirin -Acetaminophen -Caffeine Nausea And Vomiting   Codeine Diarrhea and Nausea And Vomiting    Stomach cramps     Physical Exam: BP (!) 130/52   Pulse 64   Temp 98 F (36.7 C)   SpO2 99%    Physical Exam Vitals reviewed.  Constitutional:      Appearance: Normal appearance.  HENT:     Head: Normocephalic and atraumatic.     Right Ear: Tympanic membrane normal.     Left Ear: Tympanic membrane normal.     Nose:     Comments: Bilateral nares edematous and pale with thick nasal drainage noted.  Pharynx slightly erythematous with no exudate.  Ears normal.  Eyes normal.    Mouth/Throat:     Pharynx: Oropharynx is clear.  Eyes:     Conjunctiva/sclera: Conjunctivae normal.  Cardiovascular:     Rate and Rhythm: Normal rate and regular rhythm.     Heart sounds: Normal heart sounds. No murmur heard. Pulmonary:     Effort: Pulmonary effort is normal.     Breath sounds: Normal breath sounds.     Comments: Lungs clear to auscultation Musculoskeletal:        General: Normal range of motion.     Cervical back: Normal range of motion and neck supple.  Skin:    General: Skin is warm and dry.  Neurological:     Mental Status: She is alert and oriented to person, place, and  time.  Psychiatric:        Mood and Affect: Mood normal.        Behavior: Behavior normal.        Thought Content: Thought content normal.        Judgment: Judgment normal.    Diagnostics: Deferred due to recent illness  Assessment and Plan: 1. Moderate persistent asthma without complication   2. Non-seasonal allergic rhinitis due to other allergic trigger   3. Acute bacterial sinusitis     Meds ordered this encounter  Medications   amoxicillin -clavulanate (AUGMENTIN ) 875-125 MG tablet    Sig: Take 1 tablet by mouth 2 (two) times daily.    Dispense:  14 tablet    Refill:  0    Patient Instructions   1.  Treat inflammation of  airway:  Wixela 250 - 1 inhalation 2 times per day  2.  To treat nasal inflammation and infection Augmentin  875 mg twice a day for 7 days Levocetirizine 5 mg - 1 tablet 1 time per day Flonase  - 1-2 sprays each nostril daily.  In the right nostril, point the applicator out toward the right ear. In the left nostril, point the applicator out toward the left ear Begin Nasal saline rinses before Flonase   Begin Mucinex [phone] twice a day  3. If needed:  Albuterol  HFA - 2 inhalations every 4-6 hours  4. Follow up with your primary care provider if your dizziness does not improve after taking the antibiotic for sinus infection  Call the clinic if this treatment plan is not working well for you  Follow up in 2 months or sooner if needed.   Return in about 2 months (around 01/28/2024), or if symptoms worsen or fail to improve.    Thank you for the opportunity to care for this patient.  Please do not hesitate to contact me with questions.  Arlean Mutter, FNP [facility] 

## 2023-11-27 ENCOUNTER — Encounter: Payer: Self-pay | Admitting: Family Medicine

## 2023-11-27 ENCOUNTER — Ambulatory Visit (INDEPENDENT_AMBULATORY_CARE_PROVIDER_SITE_OTHER): Admitting: Family Medicine

## 2023-11-27 ENCOUNTER — Other Ambulatory Visit: Payer: Self-pay

## 2023-11-27 VITALS — BP 130/52 | HR 64 | Temp 98.0°F

## 2023-11-27 DIAGNOSIS — J019 Acute sinusitis, unspecified: Secondary | ICD-10-CM | POA: Diagnosis not present

## 2023-11-27 DIAGNOSIS — J3089 Other allergic rhinitis: Secondary | ICD-10-CM

## 2023-11-27 DIAGNOSIS — J3489 Other specified disorders of nose and nasal sinuses: Secondary | ICD-10-CM | POA: Insufficient documentation

## 2023-11-27 DIAGNOSIS — H9203 Otalgia, bilateral: Secondary | ICD-10-CM | POA: Insufficient documentation

## 2023-11-27 DIAGNOSIS — Z6841 Body Mass Index (BMI) 40.0 and over, adult: Secondary | ICD-10-CM | POA: Insufficient documentation

## 2023-11-27 DIAGNOSIS — B9689 Other specified bacterial agents as the cause of diseases classified elsewhere: Secondary | ICD-10-CM

## 2023-11-27 DIAGNOSIS — J4540 Moderate persistent asthma, uncomplicated: Secondary | ICD-10-CM | POA: Diagnosis not present

## 2023-11-27 MED ORDER — AMOXICILLIN-POT CLAVULANATE 875-125 MG PO TABS: 1.0000 | ORAL_TABLET | Freq: Two times a day (BID) | ORAL | 0 refills | Status: DC

## 2023-11-27 NOTE — Assessment & Plan Note (Signed)
 She is encouraged to strive for BMI less than 30 to decrease cardiac risk. Advised to aim for at least 150 minutes of exercise per week.

## 2023-11-28 ENCOUNTER — Other Ambulatory Visit: Payer: Self-pay | Admitting: Allergy and Immunology

## 2023-11-30 ENCOUNTER — Other Ambulatory Visit: Payer: Self-pay

## 2023-11-30 MED ORDER — FLUTICASONE-SALMETEROL 250-50 MCG/ACT IN AEPB: 1.0000 | INHALATION_SPRAY | Freq: Two times a day (BID) | RESPIRATORY_TRACT | 5 refills | Status: DC

## 2023-11-30 NOTE — Telephone Encounter (Signed)
 Received onbase request for refill of wixela 250, refill approved, pt had appt on 11/27/23

## 2023-12-11 ENCOUNTER — Encounter: Payer: Self-pay | Admitting: Family Medicine

## 2023-12-11 ENCOUNTER — Other Ambulatory Visit: Payer: Self-pay

## 2023-12-11 ENCOUNTER — Ambulatory Visit (INDEPENDENT_AMBULATORY_CARE_PROVIDER_SITE_OTHER): Admitting: Family Medicine

## 2023-12-11 VITALS — BP 134/80 | HR 70 | Temp 97.5°F | Resp 16 | Ht 63.0 in | Wt 241.0 lb

## 2023-12-11 DIAGNOSIS — J4540 Moderate persistent asthma, uncomplicated: Secondary | ICD-10-CM | POA: Diagnosis not present

## 2023-12-11 DIAGNOSIS — J3089 Other allergic rhinitis: Secondary | ICD-10-CM

## 2023-12-11 DIAGNOSIS — R42 Dizziness and giddiness: Secondary | ICD-10-CM | POA: Diagnosis not present

## 2023-12-11 MED ORDER — MONTELUKAST SODIUM 10 MG PO TABS: 10.0000 mg | ORAL_TABLET | Freq: Every day | ORAL | 5 refills | Status: DC

## 2023-12-11 NOTE — Progress Notes (Signed)
 [address] [place] [address] Dept: [phone]  FOLLOW UP NOTE  Patient ID: Rhonda [name], female    DOB: Apr 20, 1951  Age: 73 y.o. MRN: [id number] Date of Office Visit: 12/11/2023  Assessment  Chief Complaint: Sinus Problem (Ear and sore sinus issues - dizziness and sore throat ( has been taking her antibiotics) / drainage from nose )  HPI Rhonda [name] is a 73 year old female who presents to the clinic for same-day evaluation of allergy symptoms.  She was last seen in this clinic on 11/27/2023 by Arlean Mutter, FNP, for evaluation of asthma, allergic rhinitis, and acute bacterial sinusitis requiring Augmentin .    At today's visit, she reports that she continues to experience symptoms of allergic rhinitis including clear rhinorrhea, nasal congestion, sneezing, and postnasal drainage.  She reports that she has taken the Augmentin  with no relief of symptoms.  She continues levocetirizine, Flonase , saline nasal rinses, and occasionally takes Mucinex.  She reports excellent Flonase  application technique.  We discussed beginning montelukast  and possible side effect profile. Her last environmental allergy skin testing in 1998 was positive to dust mite.  She is interested in updating her allergy testing at today's visit via lab work.  Eustachian tube dysfunction is reported as poorly controlled with frequent bilateral ear popping, pressure, and some ringing.  She began wearing hearing aids in May 2025.  She has not seen an ENT specialist at this time.  She reports that she had experienced relief of dizziness while taking Augmentin , however, has recently started to experience dizziness again.  She does report that she was on a trip to [place] when her dizziness resolved.  Asthma is reported as well-controlled with no symptoms including shortness of breath, cough, or wheezing.  She continues Wixela 250-1 puff twice a day and rarely uses albuterol  for relief of symptoms.  Her current  medications are listed in the chart.  Drug Allergies:  Allergies  Allergen Reactions   Oxycontin  [Oxycodone  Hcl] Other (See Comments)    Hallucination    Aspirin -Acetaminophen -Caffeine Nausea And Vomiting   Codeine Diarrhea and Nausea And Vomiting    Stomach cramps     Physical Exam: BP 134/80 (BP Location: Left Arm, Patient Position: Sitting, Cuff Size: Large)   Pulse 70   Temp (!) 97.5 F (36.4 C) (Temporal)   Resp 16   Ht 5' 3 (1.6 m)   Wt 241 lb (109.3 kg)   SpO2 100%   BMI 42.69 kg/m    Physical Exam Vitals reviewed.  Constitutional:      Appearance: Normal appearance.  HENT:     Head: Normocephalic and atraumatic.     Right Ear: Tympanic membrane normal.     Left Ear: Tympanic membrane normal.     Ears:     Comments: Patient with hearing aids fitting properly.    Mouth/Throat:     Pharynx: Oropharynx is clear.  Eyes:     Conjunctiva/sclera: Conjunctivae normal.  Cardiovascular:     Rate and Rhythm: Normal rate and regular rhythm.     Heart sounds: Normal heart sounds. No murmur heard. Pulmonary:     Effort: Pulmonary effort is normal.     Breath sounds: Normal breath sounds.     Comments: Lungs clear to auscultation Musculoskeletal:        General: Normal range of motion.     Cervical back: Normal range of motion and neck supple.  Skin:    General: Skin is warm and dry.  Neurological:  Mental Status: She is alert and oriented to person, place, and time.  Psychiatric:        Mood and Affect: Mood normal.        Behavior: Behavior normal.        Thought Content: Thought content normal.        Judgment: Judgment normal.      Assessment and Plan: 1. Non-seasonal allergic rhinitis due to other allergic trigger   2. Moderate persistent asthma without complication   3. Dizziness     Meds ordered this encounter  Medications   montelukast  (SINGULAIR ) 10 MG tablet    Sig: Take 1 tablet (10 mg total) by mouth at bedtime.    Dispense:  30 tablet     Refill:  5    Patient Instructions   1.  Treat inflammation of airway:  Wixela 250 - 1 inhalation 2 times per day  2.  To treat nasal inflammation   Begin montelukast  10 mg once a day Cetirizine  10 mg - 1 tablet 1 time per day. This will replace levocetirizine Flonase  - 1-2 sprays each nostril daily.  In the right nostril, point the applicator out toward the right ear. In the left nostril, point the applicator out toward the left ear Continue Nasal saline rinses before Flonase   Continue Mucinex [phone] twice a day  Update allergy testing. If negative, refer to ENT  3. If needed:  Albuterol  HFA - 2 inhalations every 4-6 hours  4. Follow up with your primary care provider for evaluation of dizziness  Call the clinic if this treatment plan is not working well for you  Follow up in 2 months or sooner if needed.  Return in about 2 months (around 02/10/2024), or if symptoms worsen or fail to improve.    Thank you for the opportunity to care for this patient.  Please do not hesitate to contact me with questions.  Arlean Mutter, FNP [facility] 

## 2023-12-11 NOTE — Patient Instructions (Addendum)
  1.  Treat inflammation of airway:  Wixela 250 - 1 inhalation 2 times per day  2.  To treat nasal inflammation   Begin montelukast  10 mg once a day Cetirizine  10 mg - 1 tablet 1 time per day. This will replace levocetirizine Flonase  - 1-2 sprays each nostril daily.  In the right nostril, point the applicator out toward the right ear. In the left nostril, point the applicator out toward the left ear Continue Nasal saline rinses before Flonase   Continue Mucinex [phone] twice a day  Update allergy testing. If negative, refer to ENT  3. If needed:  Albuterol  HFA - 2 inhalations every 4-6 hours  4. Follow up with your primary care provider for evaluation of dizziness  Call the clinic if this treatment plan is not working well for you  Follow up in 2 months or sooner if needed.

## 2023-12-14 ENCOUNTER — Ambulatory Visit: Payer: Self-pay | Admitting: Family Medicine

## 2023-12-14 LAB — ALLERGENS, ZONE 2
Alternaria Alternata IgE: <0.1 kU/L
Amer Sycamore IgE Qn: <0.1 kU/L
Aspergillus Fumigatus IgE: <0.1 kU/L
Bahia Grass IgE: <0.1 kU/L
Bermuda Grass IgE: <0.1 kU/L
Cat Dander IgE: <0.1 kU/L
Cedar, Mountain IgE: <0.1 kU/L
Cladosporium Herbarum IgE: <0.1 kU/L
Cockroach, American IgE: 0.37 kU/L — AB
Common Silver Birch IgE: <0.1 kU/L
D Farinae IgE: 0.56 kU/L — AB
D Pteronyssinus IgE: 0.61 kU/L — AB
Dog Dander IgE: 0.14 kU/L — AB
Elm, American IgE: <0.1 kU/L
Hickory, White IgE: <0.1 kU/L
Johnson Grass IgE: 0.1 kU/L — AB
Maple/Box Elder IgE: <0.1 kU/L
Mucor Racemosus IgE: <0.1 kU/L
Mugwort IgE Qn: <0.1 kU/L
Nettle IgE: 0.12 kU/L — AB
Oak, White IgE: <0.1 kU/L
Penicillium Chrysogen IgE: <0.1 kU/L
Pigweed, Rough IgE: <0.1 kU/L
Plantain, English IgE: <0.1 kU/L
Ragweed, Short IgE: <0.1 kU/L
Sheep Sorrel IgE Qn: <0.1 kU/L
Stemphylium Herbarum IgE: <0.1 kU/L
Sweet gum IgE RAST Ql: <0.1 kU/L
Timothy Grass IgE: <0.1 kU/L
White Mulberry IgE: <0.1 kU/L

## 2023-12-14 NOTE — Progress Notes (Signed)
 Can you please let this patient know her allergy testing results indicate allergy to dust mite, dog, grass pollen, cockroach, and weed pollen.  Please send a allergy measure.  Thank you

## 2023-12-29 ENCOUNTER — Emergency Department (HOSPITAL_BASED_OUTPATIENT_CLINIC_OR_DEPARTMENT_OTHER)

## 2023-12-29 ENCOUNTER — Other Ambulatory Visit: Payer: Self-pay

## 2023-12-29 ENCOUNTER — Encounter (HOSPITAL_BASED_OUTPATIENT_CLINIC_OR_DEPARTMENT_OTHER): Payer: Self-pay

## 2023-12-29 ENCOUNTER — Inpatient Hospital Stay (HOSPITAL_BASED_OUTPATIENT_CLINIC_OR_DEPARTMENT_OTHER)
Admission: EM | Admit: 2023-12-29 | Discharge: 2024-01-02 | DRG: 394 | Disposition: A | Discharge status: Home / Self Care | Attending: Internal Medicine | Admitting: Internal Medicine

## 2023-12-29 DIAGNOSIS — R109 Unspecified abdominal pain: Secondary | ICD-10-CM | POA: Diagnosis not present

## 2023-12-29 DIAGNOSIS — Z6841 Body Mass Index (BMI) 40.0 and over, adult: Secondary | ICD-10-CM | POA: Diagnosis not present

## 2023-12-29 DIAGNOSIS — K8020 Calculus of gallbladder without cholecystitis without obstruction: Secondary | ICD-10-CM | POA: Diagnosis present

## 2023-12-29 DIAGNOSIS — Z885 Allergy status to narcotic agent status: Secondary | ICD-10-CM | POA: Diagnosis not present

## 2023-12-29 DIAGNOSIS — K430 Incisional hernia with obstruction, without gangrene: Secondary | ICD-10-CM | POA: Diagnosis present

## 2023-12-29 DIAGNOSIS — Z2820 Immunization not carried out because of patient decision for unspecified reason: Secondary | ICD-10-CM | POA: Diagnosis not present

## 2023-12-29 DIAGNOSIS — K56609 Unspecified intestinal obstruction, unspecified as to partial versus complete obstruction: Principal | ICD-10-CM | POA: Diagnosis present

## 2023-12-29 DIAGNOSIS — Z886 Allergy status to analgesic agent status: Secondary | ICD-10-CM

## 2023-12-29 DIAGNOSIS — K5669 Other partial intestinal obstruction: Secondary | ICD-10-CM | POA: Diagnosis not present

## 2023-12-29 DIAGNOSIS — K449 Diaphragmatic hernia without obstruction or gangrene: Secondary | ICD-10-CM | POA: Diagnosis not present

## 2023-12-29 DIAGNOSIS — I10 Essential (primary) hypertension: Secondary | ICD-10-CM | POA: Diagnosis present

## 2023-12-29 DIAGNOSIS — E119 Type 2 diabetes mellitus without complications: Secondary | ICD-10-CM | POA: Diagnosis present

## 2023-12-29 DIAGNOSIS — Z825 Family history of asthma and other chronic lower respiratory diseases: Secondary | ICD-10-CM | POA: Diagnosis not present

## 2023-12-29 DIAGNOSIS — Z79899 Other long term (current) drug therapy: Secondary | ICD-10-CM

## 2023-12-29 DIAGNOSIS — K5730 Diverticulosis of large intestine without perforation or abscess without bleeding: Secondary | ICD-10-CM | POA: Diagnosis not present

## 2023-12-29 DIAGNOSIS — R140 Abdominal distension (gaseous): Secondary | ICD-10-CM | POA: Diagnosis not present

## 2023-12-29 DIAGNOSIS — D259 Leiomyoma of uterus, unspecified: Secondary | ICD-10-CM | POA: Diagnosis not present

## 2023-12-29 DIAGNOSIS — K439 Ventral hernia without obstruction or gangrene: Secondary | ICD-10-CM | POA: Diagnosis not present

## 2023-12-29 DIAGNOSIS — E785 Hyperlipidemia, unspecified: Secondary | ICD-10-CM | POA: Diagnosis present

## 2023-12-29 DIAGNOSIS — I480 Paroxysmal atrial fibrillation: Secondary | ICD-10-CM | POA: Diagnosis present

## 2023-12-29 DIAGNOSIS — Z86711 Personal history of pulmonary embolism: Secondary | ICD-10-CM | POA: Diagnosis not present

## 2023-12-29 DIAGNOSIS — J45909 Unspecified asthma, uncomplicated: Secondary | ICD-10-CM | POA: Diagnosis present

## 2023-12-29 DIAGNOSIS — K436 Other and unspecified ventral hernia with obstruction, without gangrene: Secondary | ICD-10-CM | POA: Diagnosis not present

## 2023-12-29 DIAGNOSIS — Z4682 Encounter for fitting and adjustment of non-vascular catheter: Secondary | ICD-10-CM | POA: Diagnosis not present

## 2023-12-29 DIAGNOSIS — K6389 Other specified diseases of intestine: Secondary | ICD-10-CM | POA: Diagnosis not present

## 2023-12-29 DIAGNOSIS — E66813 Obesity, class 3: Secondary | ICD-10-CM | POA: Diagnosis present

## 2023-12-29 DIAGNOSIS — Z981 Arthrodesis status: Secondary | ICD-10-CM | POA: Diagnosis not present

## 2023-12-29 DIAGNOSIS — R1084 Generalized abdominal pain: Secondary | ICD-10-CM | POA: Diagnosis present

## 2023-12-29 DIAGNOSIS — Z7951 Long term (current) use of inhaled steroids: Secondary | ICD-10-CM | POA: Diagnosis not present

## 2023-12-29 DIAGNOSIS — E860 Dehydration: Secondary | ICD-10-CM | POA: Diagnosis present

## 2023-12-29 LAB — CBC
HCT: 43.7 % (ref 36.0–46.0)
Hemoglobin: 14.7 g/dL (ref 12.0–15.0)
MCH: 28.4 pg (ref 26.0–34.0)
MCHC: 33.6 g/dL (ref 30.0–36.0)
MCV: 84.5 fL (ref 80.0–100.0)
Platelets: 339 K/uL (ref 150–400)
RBC: 5.17 MIL/uL — ABNORMAL HIGH (ref 3.87–5.11)
RDW: 14.6 % (ref 11.5–15.5)
WBC: 9.1 K/uL (ref 4.0–10.5)
nRBC: 0 % (ref 0.0–0.2)

## 2023-12-29 LAB — URINALYSIS, MICROSCOPIC (REFLEX)

## 2023-12-29 LAB — URINALYSIS, ROUTINE W REFLEX MICROSCOPIC
Glucose, UA: NEGATIVE mg/dL
Ketones, ur: NEGATIVE mg/dL
Leukocytes,Ua: NEGATIVE
Nitrite: NEGATIVE
Protein, ur: >=300 mg/dL — AB
Specific Gravity, Urine: >=1.03 (ref 1.005–1.030)
pH: 5 (ref 5.0–8.0)

## 2023-12-29 LAB — COMPREHENSIVE METABOLIC PANEL WITH GFR
ALT: 16 U/L (ref 0–44)
AST: 26 U/L (ref 15–41)
Albumin: 4.8 g/dL (ref 3.5–5.0)
Alkaline Phosphatase: 80 U/L (ref 38–126)
Anion gap: 15 (ref 5–15)
BUN: 21 mg/dL (ref 8–23)
CO2: 29 mmol/L (ref 22–32)
Calcium: 10.4 mg/dL — ABNORMAL HIGH (ref 8.9–10.3)
Chloride: 94 mmol/L — ABNORMAL LOW (ref 98–111)
Creatinine, Ser: 1.23 mg/dL — ABNORMAL HIGH (ref 0.44–1.00)
GFR, Estimated: 46 mL/min — ABNORMAL LOW (ref >60)
Glucose, Bld: 121 mg/dL — ABNORMAL HIGH (ref 70–99)
Potassium: 4.1 mmol/L (ref 3.5–5.1)
Sodium: 137 mmol/L (ref 135–145)
Total Bilirubin: 0.8 mg/dL (ref 0.0–1.2)
Total Protein: 8.5 g/dL — ABNORMAL HIGH (ref 6.5–8.1)

## 2023-12-29 LAB — LIPASE, BLOOD: Lipase: 29 U/L (ref 11–51)

## 2023-12-29 MED ORDER — HYDROMORPHONE HCL 1 MG/ML IJ SOLN
0.5000 mg | Freq: Once | INTRAMUSCULAR | Status: AC
  Administered 2023-12-29: 0.5 mg via INTRAVENOUS
  Filled 2023-12-29: qty 1

## 2023-12-29 MED ORDER — LACTATED RINGERS IV BOLUS
1000.0000 mL | Freq: Once | INTRAVENOUS | Status: AC
  Administered 2023-12-29: 1000 mL via INTRAVENOUS

## 2023-12-29 MED ORDER — ALBUTEROL SULFATE (2.5 MG/3ML) 0.083% IN NEBU: 2.5000 mg | INHALATION_SOLUTION | RESPIRATORY_TRACT | Status: DC | PRN

## 2023-12-29 MED ORDER — LIDOCAINE HCL URETHRAL/MUCOSAL 2 % EX GEL
1.0000 | Freq: Once | CUTANEOUS | Status: AC
  Administered 2023-12-29: 1
  Filled 2023-12-29: qty 11

## 2023-12-29 MED ORDER — ONDANSETRON HCL 4 MG PO TABS
4.0000 mg | ORAL_TABLET | Freq: Four times a day (QID) | ORAL | Status: DC | PRN
  Administered 2024-01-01: 4 mg via ORAL
  Filled 2023-12-29: qty 1

## 2023-12-29 MED ORDER — ACETAMINOPHEN 325 MG PO TABS: 650.0000 mg | ORAL_TABLET | Freq: Four times a day (QID) | ORAL | Status: DC | PRN

## 2023-12-29 MED ORDER — IOHEXOL 300 MG/ML  SOLN
75.0000 mL | Freq: Once | INTRAMUSCULAR | Status: AC | PRN
Start: 2023-12-29 — End: 2023-12-29
  Administered 2023-12-29: 75 mL via INTRAVENOUS

## 2023-12-29 MED ORDER — ONDANSETRON HCL 4 MG/2ML IJ SOLN: 4.0000 mg | Freq: Four times a day (QID) | INTRAMUSCULAR | Status: DC | PRN

## 2023-12-29 MED ORDER — LABETALOL HCL 5 MG/ML IV SOLN: 10.0000 mg | INTRAVENOUS | Status: DC | PRN

## 2023-12-29 MED ORDER — ONDANSETRON HCL 4 MG/2ML IJ SOLN
4.0000 mg | Freq: Once | INTRAMUSCULAR | Status: AC
  Administered 2023-12-29: 4 mg via INTRAVENOUS
  Filled 2023-12-29: qty 2

## 2023-12-29 MED ORDER — ACETAMINOPHEN 650 MG RE SUPP
650.0000 mg | Freq: Four times a day (QID) | RECTAL | Status: DC | PRN
  Administered 2023-12-29 – 2023-12-30 (×2): 650 mg via RECTAL
  Filled 2023-12-29 (×3): qty 1

## 2023-12-29 MED ORDER — FLUTICASONE FUROATE-VILANTEROL 200-25 MCG/ACT IN AEPB
1.0000 | INHALATION_SPRAY | Freq: Every day | RESPIRATORY_TRACT | Status: DC
  Administered 2023-12-29 – 2024-01-02 (×4): 1 via RESPIRATORY_TRACT
  Filled 2023-12-29: qty 28

## 2023-12-29 MED ORDER — DEXTROSE-SODIUM CHLORIDE 5-0.9 % IV SOLN: INTRAVENOUS | Status: AC

## 2023-12-29 MED ORDER — DORZOLAMIDE HCL-TIMOLOL MAL 2-0.5 % OP SOLN
1.0000 [drp] | Freq: Two times a day (BID) | OPHTHALMIC | Status: DC
  Administered 2023-12-29 – 2024-01-02 (×8): 1 [drp] via OPHTHALMIC
  Filled 2023-12-29: qty 10

## 2023-12-29 MED ORDER — PANTOPRAZOLE SODIUM 40 MG IV SOLR
40.0000 mg | Freq: Every day | INTRAVENOUS | Status: DC
  Administered 2023-12-29 – 2024-01-02 (×5): 40 mg via INTRAVENOUS
  Filled 2023-12-29 (×5): qty 10

## 2023-12-29 MED ORDER — ORAL CARE MOUTH RINSE: 15.0000 mL | OROMUCOSAL | Status: DC | PRN

## 2023-12-29 NOTE — Plan of Care (Signed)
 Transfer acceptance for SBO 73 year old F with PMH of prior SBO/volvulus and multiple abdominal surgeries presenting with 3 days of emesis.  LBM 2 days prior.  CT showed SBO with transition point a small ventral abdominal wall hernia and cholelithiasis without cholecystitis.  General surgery consulted.  NG tube ordered.  Admission requested for SBO.  Hemodynamically stable.  Labs without significant finding other than mild hypocalcemia likely from dehydration.  Accepted to MedSurg floor at [facility].
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
## 2023-12-29 NOTE — ED Notes (Signed)
 Called [facility] for Transport to [facility] @14 :37.  Spoke with Tinnie

## 2023-12-29 NOTE — ED Notes (Signed)
 Pt. Reports she has had vomiting since Wednesday.   Pt. Has history of bowel obstructions and is here today with past several days of vomiting.

## 2023-12-29 NOTE — ED Triage Notes (Signed)
 Pt says that she has been throwing up for 3 days. Hx of bowel obstruction. No bm since Wednesday. Feels gassy, passing some flatus. Has hiccups. Sharp lower pain, has a hernia as well.
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
## 2023-12-29 NOTE — ED Notes (Signed)
 Pt. Is in restroom at this time with her daughter.

## 2023-12-29 NOTE — Plan of Care (Signed)

## 2023-12-30 ENCOUNTER — Inpatient Hospital Stay (HOSPITAL_COMMUNITY)

## 2023-12-30 DIAGNOSIS — K56609 Unspecified intestinal obstruction, unspecified as to partial versus complete obstruction: Secondary | ICD-10-CM | POA: Diagnosis not present

## 2023-12-30 LAB — COMPREHENSIVE METABOLIC PANEL WITH GFR
ALT: 8 U/L (ref 0–44)
AST: 17 U/L (ref 15–41)
Albumin: 3.7 g/dL (ref 3.5–5.0)
Alkaline Phosphatase: 64 U/L (ref 38–126)
Anion gap: 13 (ref 5–15)
BUN: 17 mg/dL (ref 8–23)
CO2: 27 mmol/L (ref 22–32)
Calcium: 9.1 mg/dL (ref 8.9–10.3)
Chloride: 100 mmol/L (ref 98–111)
Creatinine, Ser: 0.95 mg/dL (ref 0.44–1.00)
GFR, Estimated: >60 mL/min (ref >60)
Glucose, Bld: 109 mg/dL — ABNORMAL HIGH (ref 70–99)
Potassium: 3.4 mmol/L — ABNORMAL LOW (ref 3.5–5.1)
Sodium: 140 mmol/L (ref 135–145)
Total Bilirubin: 0.7 mg/dL (ref 0.0–1.2)
Total Protein: 6.8 g/dL (ref 6.5–8.1)

## 2023-12-30 LAB — CBC
HCT: 43 % (ref 36.0–46.0)
Hemoglobin: 13.4 g/dL (ref 12.0–15.0)
MCH: 28.4 pg (ref 26.0–34.0)
MCHC: 31.2 g/dL (ref 30.0–36.0)
MCV: 91.1 fL (ref 80.0–100.0)
Platelets: 270 K/uL (ref 150–400)
RBC: 4.72 MIL/uL (ref 3.87–5.11)
RDW: 14.8 % (ref 11.5–15.5)
WBC: 5.6 K/uL (ref 4.0–10.5)
nRBC: 0 % (ref 0.0–0.2)

## 2023-12-30 LAB — MAGNESIUM: Magnesium: 2.3 mg/dL (ref 1.7–2.4)

## 2023-12-30 MED ORDER — ENOXAPARIN SODIUM 40 MG/0.4ML IJ SOSY
40.0000 mg | PREFILLED_SYRINGE | INTRAMUSCULAR | Status: DC
  Administered 2023-12-30 – 2024-01-01 (×3): 40 mg via SUBCUTANEOUS
  Filled 2023-12-30 (×3): qty 0.4

## 2023-12-30 MED ORDER — KETOROLAC TROMETHAMINE 15 MG/ML IJ SOLN
15.0000 mg | Freq: Once | INTRAMUSCULAR | Status: AC
  Administered 2023-12-30: 15 mg via INTRAVENOUS
  Filled 2023-12-30: qty 1

## 2023-12-30 MED ORDER — DIATRIZOATE MEGLUMINE & SODIUM 66-10 % PO SOLN
90.0000 mL | Freq: Once | ORAL | Status: AC
  Administered 2023-12-30: 90 mL via NASOGASTRIC
  Filled 2023-12-30: qty 90

## 2023-12-30 MED ORDER — KETOROLAC TROMETHAMINE 15 MG/ML IJ SOLN
15.0000 mg | Freq: Once | INTRAMUSCULAR | Status: AC
Start: 2023-12-30 — End: 2023-12-30
  Administered 2023-12-30: 15 mg via INTRAVENOUS
  Filled 2023-12-30: qty 1

## 2023-12-30 MED ORDER — POTASSIUM CHLORIDE 10 MEQ/100ML IV SOLN
10.0000 meq | INTRAVENOUS | Status: AC
  Administered 2023-12-30 (×4): 10 meq via INTRAVENOUS
  Filled 2023-12-30 (×4): qty 100

## 2023-12-30 NOTE — Plan of Care (Signed)

## 2023-12-30 NOTE — Plan of Care (Signed)

## 2023-12-30 NOTE — Progress Notes (Signed)
 PROGRESS NOTE  Rhonda [name] FMW:[id number] DOB: [birth year] DOA: 12/29/2023 PCP: Georgina Speaks, FNP   LOS: 1 day   Brief narrative:   Rhonda [name] is a 73 year old F with past medical history of morbid obesity, asthma, HTN, paroxysmal atrial tachycardia, prior SBO's and volvulus with multiple abdominal surgeries in the past presented hospital with abdominal pain nausea and vomiting for 3 days.  In the ED, vitals were stable.  Labs were notable for elevated creatinine at 1.2 with calcium  elevation at 10.4. CT abdomen and pelvis showed SBO with transition point at small ventral abdominal wall hernia and cholelithiasis without evidence of cholecystitis.  General surgery was consulted.  NG tube ordered and patient was admitted hospital for further evaluation and treatment.   Assessment/Plan: Principal Problem:   SBO (small bowel obstruction) (HCC)  Small bowel obstruction:  Prior history of SBO and volvulus.  Has had multiple abdominal surgeries in the past.  CT shows SBO with transition point at small ventral abdominal wall hernia and cholelithiasis.  Continue n.p.o. IV fluids as per protocol.  General surgery following.  Follow surgical recommendation.  Continue antiemetics.   Cholelithiasis: Incidental noted on the CT scan of the abdomen.   Hypercalcemia: Likely due to dehydration. HCTZ on hold.  Continue hydration.  Calcium  level has improved.   Essential hypertension:  On as needed IV labetalol .  Blood pressure relatively controlled   Asthma: Continue  inhalers.  Not in exacerbation.   Hyperlipidemia On statins at home.  Currently on hold   Class III obesity: BMI 42.51. Would benefit from lifestyle modifications and weight loss as outpatient   DVT prophylaxis: Add Lovenox  subcu   Disposition: Home likely in 1 to 2 days  Status is: Inpatient Remains inpatient appropriate because: Small bowel obstruction, n.p.o., IV fluids,    Code Status:     Code Status: Full  Code  Family Communication: Spoke with the patient's family at bedside  Consultants: General Surgery  Procedures: NG tube placement  Anti-infectives:  None  Anti-infectives (From admission, onward)    None        Subjective: Today, patient was seen and examined at bedside.  Feels little better with the abdomen except for some soreness and passed some gas but complains of headache especially with NG tube in place.  Had a history of sinusitis.  Objective: Vitals:   12/30/23 0554 12/30/23 0852  BP: (!) 138/57 126/60  Pulse: 90 72  Resp: 16 14  Temp: 98.7 F (37.1 C) 97.7 F (36.5 C)  SpO2: 98% 100%    Intake/Output Summary (Last 24 hours) at 12/30/2023 1423 Last data filed at 12/30/2023 0601 Gross per 24 hour  Intake 1096.57 ml  Output 450 ml  Net 646.57 ml   Filed Weights   12/29/23 1056  Weight: 108.9 kg   Body mass index is 42.51 kg/m.   Physical Exam:  GENERAL: Patient is alert awake and oriented. Not in obvious distress.  Obese built, NG tube in place HENT: No scleral pallor or icterus. Pupils equally reactive to light. Oral mucosa is moist NECK: is supple, no gross swelling noted. CHEST: Clear to auscultation. No crackles or wheezes.  Diminished breath sounds bilaterally. CVS: S1 and S2 heard, no murmur. Regular rate and rhythm.  ABDOMEN: Soft, mild nonspecific tenderness bowel sounds are present.  Abdominal hernia noted EXTREMITIES: No edema. CNS: Cranial nerves are intact. No focal motor deficits. SKIN: warm and dry without rashes.  Data Review: I have personally reviewed the following  laboratory data and studies,  CBC: Recent Labs  Lab 12/29/23 1122 12/30/23 0535  WBC 9.1 5.6  HGB 14.7 13.4  HCT 43.7 43.0  MCV 84.5 91.1  PLT 339 270   Basic Metabolic Panel: Recent Labs  Lab 12/29/23 1122 12/30/23 0535  NA 137 140  K 4.1 3.4*  CL 94* 100  CO2 29 27  GLUCOSE 121* 109*  BUN 21 17  CREATININE 1.23* 0.95  CALCIUM  10.4* 9.1  MG   --  2.3   Liver Function Tests: Recent Labs  Lab 12/29/23 1122 12/30/23 0535  AST 26 17  ALT 16 8  ALKPHOS 80 64  BILITOT 0.8 0.7  PROT 8.5* 6.8  ALBUMIN 4.8 3.7   Recent Labs  Lab 12/29/23 1122  LIPASE 29   No results for input(s): AMMONIA in the last 168 hours. Cardiac Enzymes: No results for input(s): CKTOTAL, CKMB, CKMBINDEX, TROPONINI in the last 168 hours. BNP (last 3 results) No results for input(s): BNP in the last 8760 hours.  ProBNP (last 3 results) No results for input(s): PROBNP in the last 8760 hours.  CBG: No results for input(s): GLUCAP in the last 168 hours. No results found for this or any previous visit (from the past 240 hours).   Studies: DG Abd Portable 1V Result Date: 12/30/2023 CLINICAL DATA:  Abdominal pain and distention. Follow-up small bowel obstruction. EXAM: PORTABLE ABDOMEN - 1 VIEW COMPARISON:  12/29/2023 FINDINGS: Nasogastric tube tip in the proximal stomach and side hole in the mid stomach. Significantly decreased bowel distention with a single residual mildly distended loop seen in the right lower abdomen. Calcified uterine fibroid. Lumbar and lower thoracic spine degenerative changes and mild scoliosis. Lumbosacral spine fixation hardware. IMPRESSION: Significantly decreased bowel distention with a single residual mildly distended loop in the right lower abdomen. Electronically Signed   By: Elspeth Bathe M.D.   On: 12/30/2023 10:06   DG Abd Portable 1 View Result Date: 12/29/2023 CLINICAL DATA:  NG tube placement. EXAM: PORTABLE ABDOMEN - 1 VIEW COMPARISON:  CT earlier today FINDINGS: Tip and side port of the enteric tube below the diaphragm in the stomach. Gaseous bowel distention in the upper abdomen. IMPRESSION: Tip and side port of the enteric tube below the diaphragm in the stomach. Electronically Signed   By: Andrea Gasman M.D.   On: 12/29/2023 15:42   CT ABDOMEN PELVIS W CONTRAST Result Date: 12/29/2023 EXAM: CT  ABDOMEN AND PELVIS WITH CONTRAST 12/29/2023 12:40:00 PM TECHNIQUE: CT of the abdomen and pelvis was performed with the administration of intravenous contrast. Multiplanar reformatted images are provided for review. Automated exposure control, iterative reconstruction, and/or weight-based adjustment of the mA/kV was utilized to reduce the radiation dose to as low as reasonably achievable. COMPARISON: None available. CLINICAL HISTORY: Abdominal pain, acute, nonlocalized; Bowel obstruction suspected. Pt says that she has been throwing up for 3 days. Hx of bowel obstruction. No bm since Wednesday. Feels gassy, passing some flatus. Has hiccups. Sharp lower pain, has a hernia as well. FINDINGS: LOWER CHEST: Visualized lung bases are clear. LIVER: Normal size and contour. GALLBLADDER AND BILE DUCTS: Gallstones or hyperdense sludge noted within the dependent portion of the gallbladder. No gallbladder wall thickening or surrounding inflammatory change. No bile duct dilatation. SPLEEN: Normal size. No focal lesion. PANCREAS: No mass. No ductal dilatation. ADRENAL GLANDS: Normal appearance. No mass. KIDNEYS, URETERS AND BLADDER: Bosniak class 1 cyst identified within the posterior cortex of the right kidney measuring 1.1 cm, image 31/301. No follow  up imaging recommended. No stones in the kidneys or ureters. No hydronephrosis. No perinephric or periureteral stranding. Urinary bladder is unremarkable. GI AND BOWEL: Small hiatal hernia. Proximal and mid small bowel loops are abnormally dilated with air-fluid levels measuring up to 4.2 cm. There is a midline ventral abdominal wall hernia where the transition from dilated proximal and mid small bowel loops to decreased caliber distal small bowel is identified. The appendix is visualized and appears normal. The colon is largely decompressed. Distal colonic diverticulosis without signs of acute diverticulitis. PERITONEUM AND RETROPERITONEUM: There is a trace amount of free fluid  within the posterior pelvis. No focal fluid collections. No signs of pneumoperitoneum. VASCULATURE: Aortic atherosclerotic calcifications. LYMPH NODES: No lymphadenopathy. REPRODUCTIVE ORGANS: Calcified uterine fibroid. No adnexal mass. BONES AND SOFT TISSUES: Signs of previous PLIF at L5-S1. Lumbar degenerative disc disease and facet arthropathy. No acute osseous abnormality. No focal soft tissue abnormality. IMPRESSION: 1. Small bowel obstruction with transition point at a midline ventral abdominal wall hernia. 2. Layering gallstones or hyperdense sludge within the dependent portion of the gallbladder without wall thickening or pericholecystic inflammation. 3. Small hiatal hernia. Electronically signed by: Waddell Calk MD 12/29/2023 01:01 PM EDT RP Workstation: [id number]      Vernal Alstrom, MD  [facility] 12/30/2023  If 7PM-7AM, please contact night-coverage

## 2023-12-30 NOTE — Progress Notes (Signed)
 Subjective/Chief Complaint: Feels better, no ab pain, has passed some flatus   Objective: Vital signs in last 24 hours: Temp:  [98.2 F (36.8 C)-99.1 F (37.3 C)] 98.7 F (37.1 C) (08/30 0554) Pulse Rate:  [78-102] 90 (08/30 0554) Resp:  [16-18] 16 (08/30 0554) BP: (122-146)/(55-75) 138/57 (08/30 0554) SpO2:  [96 %-100 %] 98 % (08/30 0554) Weight:  [108.9 kg] 108.9 kg (08/29 1056) Last BM Date : 12/27/23  Intake/Output from previous day: 08/29 0701 - 08/30 0700 In: 2101.1 [I.V.:1066.6; NG/GT:30; IV Piggyback:1004.5] Out: 450 [Urine:400; Emesis/NG output:50] Intake/Output this shift: No intake/output data recorded.  Ab soft hernia soft and nontender  Lab Results:  Recent Labs    12/29/23 1122 12/30/23 0535  WBC 9.1 5.6  HGB 14.7 13.4  HCT 43.7 43.0  PLT 339 270   BMET Recent Labs    12/29/23 1122 12/30/23 0535  NA 137 140  K 4.1 3.4*  CL 94* 100  CO2 29 27  GLUCOSE 121* 109*  BUN 21 17  CREATININE 1.23* 0.95  CALCIUM  10.4* 9.1   PT/INR No results for input(s): LABPROT, INR in the last 72 hours. ABG No results for input(s): PHART, HCO3 in the last 72 hours.  Invalid input(s): PCO2, PO2  Studies/Results: DG Abd Portable 1 View Result Date: 12/29/2023 CLINICAL DATA:  NG tube placement. EXAM: PORTABLE ABDOMEN - 1 VIEW COMPARISON:  CT earlier today FINDINGS: Tip and side port of the enteric tube below the diaphragm in the stomach. Gaseous bowel distention in the upper abdomen. IMPRESSION: Tip and side port of the enteric tube below the diaphragm in the stomach. Electronically Signed   By: Andrea Gasman M.D.   On: 12/29/2023 15:42   CT ABDOMEN PELVIS W CONTRAST Result Date: 12/29/2023 EXAM: CT ABDOMEN AND PELVIS WITH CONTRAST 12/29/2023 12:40:00 PM TECHNIQUE: CT of the abdomen and pelvis was performed with the administration of intravenous contrast. Multiplanar reformatted images are provided for review. Automated exposure control, iterative  reconstruction, and/or weight-based adjustment of the mA/kV was utilized to reduce the radiation dose to as low as reasonably achievable. COMPARISON: None available. CLINICAL HISTORY: Abdominal pain, acute, nonlocalized; Bowel obstruction suspected. Pt says that she has been throwing up for 3 days. Hx of bowel obstruction. No bm since Wednesday. Feels gassy, passing some flatus. Has hiccups. Sharp lower pain, has a hernia as well. FINDINGS: LOWER CHEST: Visualized lung bases are clear. LIVER: Normal size and contour. GALLBLADDER AND BILE DUCTS: Gallstones or hyperdense sludge noted within the dependent portion of the gallbladder. No gallbladder wall thickening or surrounding inflammatory change. No bile duct dilatation. SPLEEN: Normal size. No focal lesion. PANCREAS: No mass. No ductal dilatation. ADRENAL GLANDS: Normal appearance. No mass. KIDNEYS, URETERS AND BLADDER: Bosniak class 1 cyst identified within the posterior cortex of the right kidney measuring 1.1 cm, image 31/301. No follow up imaging recommended. No stones in the kidneys or ureters. No hydronephrosis. No perinephric or periureteral stranding. Urinary bladder is unremarkable. GI AND BOWEL: Small hiatal hernia. Proximal and mid small bowel loops are abnormally dilated with air-fluid levels measuring up to 4.2 cm. There is a midline ventral abdominal wall hernia where the transition from dilated proximal and mid small bowel loops to decreased caliber distal small bowel is identified. The appendix is visualized and appears normal. The colon is largely decompressed. Distal colonic diverticulosis without signs of acute diverticulitis. PERITONEUM AND RETROPERITONEUM: There is a trace amount of free fluid within the posterior pelvis. No focal fluid collections. No  signs of pneumoperitoneum. VASCULATURE: Aortic atherosclerotic calcifications. LYMPH NODES: No lymphadenopathy. REPRODUCTIVE ORGANS: Calcified uterine fibroid. No adnexal mass. BONES AND SOFT  TISSUES: Signs of previous PLIF at L5-S1. Lumbar degenerative disc disease and facet arthropathy. No acute osseous abnormality. No focal soft tissue abnormality. IMPRESSION: 1. Small bowel obstruction with transition point at a midline ventral abdominal wall hernia. 2. Layering gallstones or hyperdense sludge within the dependent portion of the gallbladder without wall thickening or pericholecystic inflammation. 3. Small hiatal hernia. Electronically signed by: Waddell Calk MD 12/29/2023 01:01 PM EDT RP Workstation: [id number]    Anti-infectives: Anti-infectives (From admission, onward)    None       Assessment/Plan: Recurrent incisional hernia with SBO -sbo appears to be resolving already, no indication for urgent surgery and  I think reasonable to observe still given her history and morbidity of surgery. I do think if this resolves she should be evaluated for elective hernia repair -will do sbo protocol today  I reviewed all imaging, vitals and labs  Donnice Bury 12/30/2023

## 2023-12-31 DIAGNOSIS — K56609 Unspecified intestinal obstruction, unspecified as to partial versus complete obstruction: Secondary | ICD-10-CM | POA: Diagnosis not present

## 2023-12-31 LAB — CBC
HCT: 35.8 % — ABNORMAL LOW (ref 36.0–46.0)
Hemoglobin: 11.2 g/dL — ABNORMAL LOW (ref 12.0–15.0)
MCH: 27.4 pg (ref 26.0–34.0)
MCHC: 31.3 g/dL (ref 30.0–36.0)
MCV: 87.5 fL (ref 80.0–100.0)
Platelets: 266 K/uL (ref 150–400)
RBC: 4.09 MIL/uL (ref 3.87–5.11)
RDW: 14.7 % (ref 11.5–15.5)
WBC: 6.4 K/uL (ref 4.0–10.5)
nRBC: 0 % (ref 0.0–0.2)

## 2023-12-31 LAB — BASIC METABOLIC PANEL WITH GFR
Anion gap: 13 (ref 5–15)
BUN: 18 mg/dL (ref 8–23)
CO2: 25 mmol/L (ref 22–32)
Calcium: 8.9 mg/dL (ref 8.9–10.3)
Chloride: 105 mmol/L (ref 98–111)
Creatinine, Ser: 0.96 mg/dL (ref 0.44–1.00)
GFR, Estimated: >60 mL/min (ref >60)
Glucose, Bld: 104 mg/dL — ABNORMAL HIGH (ref 70–99)
Potassium: 3.5 mmol/L (ref 3.5–5.1)
Sodium: 143 mmol/L (ref 135–145)

## 2023-12-31 LAB — PHOSPHORUS: Phosphorus: 2.4 mg/dL — ABNORMAL LOW (ref 2.5–4.6)

## 2023-12-31 LAB — MAGNESIUM: Magnesium: 2 mg/dL (ref 1.7–2.4)

## 2023-12-31 MED ORDER — KETOROLAC TROMETHAMINE 15 MG/ML IJ SOLN
15.0000 mg | Freq: Once | INTRAMUSCULAR | Status: AC
  Administered 2023-12-31: 15 mg via INTRAVENOUS
  Filled 2023-12-31: qty 1

## 2023-12-31 MED ORDER — POTASSIUM PHOSPHATES 15 MMOLE/5ML IV SOLN
20.0000 mmol | Freq: Once | INTRAVENOUS | Status: AC
  Administered 2023-12-31: 20 mmol via INTRAVENOUS
  Filled 2023-12-31: qty 6.67

## 2023-12-31 MED ORDER — KCL IN DEXTROSE-NACL 20-5-0.45 MEQ/L-%-% IV SOLN
INTRAVENOUS | Status: AC
  Filled 2023-12-31 (×3): qty 1000

## 2023-12-31 NOTE — Plan of Care (Signed)
   Problem: Education: Goal: Knowledge of General Education information will improve Description: Including pain rating scale, medication(s)/side effects and non-pharmacologic comfort measures Outcome: Progressing   Problem: Coping: Goal: Level of anxiety will decrease Outcome: Progressing

## 2023-12-31 NOTE — Progress Notes (Signed)
 Subjective/Chief Complaint: Feels well, having flatus and bowel function, no ab pain, only issue is ng tube   Objective: Vital signs in last 24 hours: Temp:  [97.7 F (36.5 C)-99.1 F (37.3 C)] 98.9 F (37.2 C) (08/31 0626) Pulse Rate:  [69-83] 83 (08/31 0626) Resp:  [14-18] 18 (08/31 0626) BP: (126-142)/(60-68) 142/63 (08/31 0626) SpO2:  [97 %-100 %] 97 % (08/31 0726) Last BM Date : 12/27/23  Intake/Output from previous day: 08/30 0701 - 08/31 0700 In: 1082.7 [I.V.:774.8; IV Piggyback:307.9] Out: 100 [Emesis/NG output:100] Intake/Output this shift: No intake/output data recorded.  Ab soft nontender, hernia soft  Lab Results:  Recent Labs    12/30/23 0535 12/31/23 0521  WBC 5.6 6.4  HGB 13.4 11.2*  HCT 43.0 35.8*  PLT 270 266   BMET Recent Labs    12/30/23 0535 12/31/23 0521  NA 140 143  K 3.4* 3.5  CL 100 105  CO2 27 25  GLUCOSE 109* 104*  BUN 17 18  CREATININE 0.95 0.96  CALCIUM  9.1 8.9   PT/INR No results for input(s): LABPROT, INR in the last 72 hours. ABG No results for input(s): PHART, HCO3 in the last 72 hours.  Invalid input(s): PCO2, PO2  Studies/Results: DG Abd Portable 1V-Small Bowel Obstruction Protocol-initial, 8 hr delay Result Date: 12/30/2023 CLINICAL DATA:  8 hour delay for small bowel obstruction EXAM: PORTABLE ABDOMEN - 1 VIEW COMPARISON:  Abdominal x-ray 12/30/2023 FINDINGS: Contrast is seen throughout nondilated colon to the level the rectum. Mildly dilated central small bowel loops measuring 4 cm are again noted. Enteric tube is looped in the body of the stomach, unchanged. IMPRESSION: 1. No evidence for bowel obstruction. Contrast is seen to the level the rectum. 2. Mildly dilated central small bowel loops measuring 4 cm are again noted, favored as ileus or partial small bowel obstruction. Electronically Signed   By: Greig Pique M.D.   On: 12/30/2023 20:12   DG Abd Portable 1V Result Date: 12/30/2023 CLINICAL  DATA:  Abdominal pain and distention. Follow-up small bowel obstruction. EXAM: PORTABLE ABDOMEN - 1 VIEW COMPARISON:  12/29/2023 FINDINGS: Nasogastric tube tip in the proximal stomach and side hole in the mid stomach. Significantly decreased bowel distention with a single residual mildly distended loop seen in the right lower abdomen. Calcified uterine fibroid. Lumbar and lower thoracic spine degenerative changes and mild scoliosis. Lumbosacral spine fixation hardware. IMPRESSION: Significantly decreased bowel distention with a single residual mildly distended loop in the right lower abdomen. Electronically Signed   By: Elspeth Bathe M.D.   On: 12/30/2023 10:06   DG Abd Portable 1 View Result Date: 12/29/2023 CLINICAL DATA:  NG tube placement. EXAM: PORTABLE ABDOMEN - 1 VIEW COMPARISON:  CT earlier today FINDINGS: Tip and side port of the enteric tube below the diaphragm in the stomach. Gaseous bowel distention in the upper abdomen. IMPRESSION: Tip and side port of the enteric tube below the diaphragm in the stomach. Electronically Signed   By: Andrea Gasman M.D.   On: 12/29/2023 15:42   CT ABDOMEN PELVIS W CONTRAST Result Date: 12/29/2023 EXAM: CT ABDOMEN AND PELVIS WITH CONTRAST 12/29/2023 12:40:00 PM TECHNIQUE: CT of the abdomen and pelvis was performed with the administration of intravenous contrast. Multiplanar reformatted images are provided for review. Automated exposure control, iterative reconstruction, and/or weight-based adjustment of the mA/kV was utilized to reduce the radiation dose to as low as reasonably achievable. COMPARISON: None available. CLINICAL HISTORY: Abdominal pain, acute, nonlocalized; Bowel obstruction suspected. Pt says that  she has been throwing up for 3 days. Hx of bowel obstruction. No bm since Wednesday. Feels gassy, passing some flatus. Has hiccups. Sharp lower pain, has a hernia as well. FINDINGS: LOWER CHEST: Visualized lung bases are clear. LIVER: Normal size and  contour. GALLBLADDER AND BILE DUCTS: Gallstones or hyperdense sludge noted within the dependent portion of the gallbladder. No gallbladder wall thickening or surrounding inflammatory change. No bile duct dilatation. SPLEEN: Normal size. No focal lesion. PANCREAS: No mass. No ductal dilatation. ADRENAL GLANDS: Normal appearance. No mass. KIDNEYS, URETERS AND BLADDER: Bosniak class 1 cyst identified within the posterior cortex of the right kidney measuring 1.1 cm, image 31/301. No follow up imaging recommended. No stones in the kidneys or ureters. No hydronephrosis. No perinephric or periureteral stranding. Urinary bladder is unremarkable. GI AND BOWEL: Small hiatal hernia. Proximal and mid small bowel loops are abnormally dilated with air-fluid levels measuring up to 4.2 cm. There is a midline ventral abdominal wall hernia where the transition from dilated proximal and mid small bowel loops to decreased caliber distal small bowel is identified. The appendix is visualized and appears normal. The colon is largely decompressed. Distal colonic diverticulosis without signs of acute diverticulitis. PERITONEUM AND RETROPERITONEUM: There is a trace amount of free fluid within the posterior pelvis. No focal fluid collections. No signs of pneumoperitoneum. VASCULATURE: Aortic atherosclerotic calcifications. LYMPH NODES: No lymphadenopathy. REPRODUCTIVE ORGANS: Calcified uterine fibroid. No adnexal mass. BONES AND SOFT TISSUES: Signs of previous PLIF at L5-S1. Lumbar degenerative disc disease and facet arthropathy. No acute osseous abnormality. No focal soft tissue abnormality. IMPRESSION: 1. Small bowel obstruction with transition point at a midline ventral abdominal wall hernia. 2. Layering gallstones or hyperdense sludge within the dependent portion of the gallbladder without wall thickening or pericholecystic inflammation. 3. Small hiatal hernia. Electronically signed by: Waddell Calk MD 12/29/2023 01:01 PM EDT RP  Workstation: [id number]    Anti-infectives: Anti-infectives (From admission, onward)    None       Assessment/Plan: Recurrent incisional hernia with SBO -sbo appears to be resolving clinically and radiologically  will remove ng and give clears and hopefully does well. I do think if this resolves she should be evaluated for elective hernia repair -lovenox     Rhonda [name] 12/31/2023

## 2023-12-31 NOTE — Plan of Care (Signed)

## 2023-12-31 NOTE — Progress Notes (Signed)
 PROGRESS NOTE  Rhonda [name] FMW:[id number] DOB: Dec 08, 1950 DOA: 12/29/2023 PCP: Georgina Speaks, FNP   LOS: 2 days   Brief narrative:   Rhonda [name] is a 73 year old F with past medical history of morbid obesity, asthma, HTN, paroxysmal atrial tachycardia, prior SBO's and volvulus with multiple abdominal surgeries in the past presented hospital with abdominal pain nausea and vomiting for 3 days.  In the ED, vitals were stable.  Labs were notable for elevated creatinine at 1.2 with calcium  elevation at 10.4. CT abdomen and pelvis showed SBO with transition point at small ventral abdominal wall hernia and cholelithiasis without evidence of cholecystitis.  General surgery was consulted.  NG tube ordered and patient was admitted to the hospital for further evaluation and treatment.   Assessment/Plan: Principal Problem:   SBO (small bowel obstruction) (HCC)  Small bowel obstruction:   Prior history of SBO and volvulus.  Has had multiple abdominal surgeries in the past.  CT scan of the abdomen and pelvis showed SBO with transition point at small ventral abdominal wall hernia and cholelithiasis.  He was treated treated conservatively with n.p.o. IV fluids NG tube.  General surgery following.  At this time patient has had flatus and bowel movement.  Continue IV fluids.  Clears has been started.  Off NG tube at this time.  Follow general surgery recommendation. Cholelithiasis: Incidental noted on the CT scan of the abdomen.   Hypercalcemia: HCTZ on hold.  Latest calcium  has normalized 8.9.  Hypophosphatemia.  Will replace with K-Phos.  On D5 half-normal saline with KCl at this time.  Will continue.  Check levels in AM.   Essential hypertension:  On as needed IV labetalol .  Blood pressure relatively controlled   Asthma: Continue  inhalers.  Not in exacerbation.   Hyperlipidemia On statins at home.  Currently on hold   Class III obesity: BMI 42.51. Would benefit from lifestyle  modifications and weight loss as outpatient   DVT prophylaxis: enoxaparin  (LOVENOX ) injection 40 mg Start: 12/30/23 1700Add Lovenox  subcu   Disposition: Home likely in 1 to 2 days when bowel obstruction resolved and okay with surgery  Status is: Inpatient Remains inpatient appropriate because: , IV fluids, pending clinical improvement  Code Status:     Code Status: Full Code  Family Communication: Spoke with the patient's family at bedside on 12/30/2023  Consultants: General Surgery  Procedures: NG tube placement and removal  Anti-infectives:  None  Anti-infectives (From admission, onward)    None        Subjective: Today, patient was seen and examined at bedside.  Patient denies any nausea, vomiting, fever, chills or rigor.  Feels better than before.  NG tube has come out.  Has had some gas and bowel movement.   Objective: Vitals:   12/31/23 0626 12/31/23 0726  BP: (!) 142/63   Pulse: 83   Resp: 18   Temp: 98.9 F (37.2 C)   SpO2: 98% 97%    Intake/Output Summary (Last 24 hours) at 12/31/2023 0749 Last data filed at 12/31/2023 0600 Gross per 24 hour  Intake 1082.66 ml  Output 100 ml  Net 982.66 ml   Filed Weights   12/29/23 1056  Weight: 108.9 kg   Body mass index is 42.51 kg/m.   Physical Exam:  GENERAL: Patient is alert awake and oriented. Not in obvious distress.  Obese build. HENT: No scleral pallor or icterus. Pupils equally reactive to light. Oral mucosa is moist NECK: is supple, no gross swelling noted. CHEST:  Clear to auscultation. No crackles or wheezes.  CVS: S1 and S2 heard, no murmur. Regular rate and rhythm.  ABDOMEN: Soft, mild nonspecific tenderness bowel sounds are present.  Abdominal hernia noted EXTREMITIES: No edema. CNS: Cranial nerves are intact. No focal motor deficits. SKIN: warm and dry without rashes.  Data Review: I have personally reviewed the following laboratory data and studies,  CBC: Recent Labs  Lab  01-26-2024 1122 12/30/23 0535 12/31/23 0521  WBC 9.1 5.6 6.4  HGB 14.7 13.4 11.2*  HCT 43.7 43.0 35.8*  MCV 84.5 91.1 87.5  PLT 339 270 266   Basic Metabolic Panel: Recent Labs  Lab 01-26-2024 1122 12/30/23 0535 12/31/23 0521  NA 137 140 143  K 4.1 3.4* 3.5  CL 94* 100 105  CO2 29 27 25   GLUCOSE 121* 109* 104*  BUN 21 17 18   CREATININE 1.23* 0.95 0.96  CALCIUM  10.4* 9.1 8.9  MG  --  2.3 2.0  PHOS  --   --  2.4*   Liver Function Tests: Recent Labs  Lab Jan 26, 2024 1122 12/30/23 0535  AST 26 17  ALT 16 8  ALKPHOS 80 64  BILITOT 0.8 0.7  PROT 8.5* 6.8  ALBUMIN 4.8 3.7   Recent Labs  Lab 2024-01-26 1122  LIPASE 29   No results for input(s): AMMONIA in the last 168 hours. Cardiac Enzymes: No results for input(s): CKTOTAL, CKMB, CKMBINDEX, TROPONINI in the last 168 hours. BNP (last 3 results) No results for input(s): BNP in the last 8760 hours.  ProBNP (last 3 results) No results for input(s): PROBNP in the last 8760 hours.  CBG: No results for input(s): GLUCAP in the last 168 hours. No results found for this or any previous visit (from the past 240 hours).   Studies: DG Abd Portable 1V-Small Bowel Obstruction Protocol-initial, 8 hr delay Result Date: 12/30/2023 CLINICAL DATA:  8 hour delay for small bowel obstruction EXAM: PORTABLE ABDOMEN - 1 VIEW COMPARISON:  Abdominal x-ray 12/30/2023 FINDINGS: Contrast is seen throughout nondilated colon to the level the rectum. Mildly dilated central small bowel loops measuring 4 cm are again noted. Enteric tube is looped in the body of the stomach, unchanged. IMPRESSION: 1. No evidence for bowel obstruction. Contrast is seen to the level the rectum. 2. Mildly dilated central small bowel loops measuring 4 cm are again noted, favored as ileus or partial small bowel obstruction. Electronically Signed   By: Greig Pique M.D.   On: 12/30/2023 20:12   DG Abd Portable 1V Result Date: 12/30/2023 CLINICAL DATA:  Abdominal  pain and distention. Follow-up small bowel obstruction. EXAM: PORTABLE ABDOMEN - 1 VIEW COMPARISON:  01-26-2024 FINDINGS: Nasogastric tube tip in the proximal stomach and side hole in the mid stomach. Significantly decreased bowel distention with a single residual mildly distended loop seen in the right lower abdomen. Calcified uterine fibroid. Lumbar and lower thoracic spine degenerative changes and mild scoliosis. Lumbosacral spine fixation hardware. IMPRESSION: Significantly decreased bowel distention with a single residual mildly distended loop in the right lower abdomen. Electronically Signed   By: Elspeth Bathe M.D.   On: 12/30/2023 10:06   DG Abd Portable 1 View Result Date: 01-26-2024 CLINICAL DATA:  NG tube placement. EXAM: PORTABLE ABDOMEN - 1 VIEW COMPARISON:  CT earlier today FINDINGS: Tip and side port of the enteric tube below the diaphragm in the stomach. Gaseous bowel distention in the upper abdomen. IMPRESSION: Tip and side port of the enteric tube below the diaphragm in the stomach. Electronically  Signed   By: Andrea Gasman M.D.   On: 12/29/2023 15:42   CT ABDOMEN PELVIS W CONTRAST Result Date: 12/29/2023 EXAM: CT ABDOMEN AND PELVIS WITH CONTRAST 12/29/2023 12:40:00 PM TECHNIQUE: CT of the abdomen and pelvis was performed with the administration of intravenous contrast. Multiplanar reformatted images are provided for review. Automated exposure control, iterative reconstruction, and/or weight-based adjustment of the mA/kV was utilized to reduce the radiation dose to as low as reasonably achievable. COMPARISON: None available. CLINICAL HISTORY: Abdominal pain, acute, nonlocalized; Bowel obstruction suspected. Pt says that she has been throwing up for 3 days. Hx of bowel obstruction. No bm since Wednesday. Feels gassy, passing some flatus. Has hiccups. Sharp lower pain, has a hernia as well. FINDINGS: LOWER CHEST: Visualized lung bases are clear. LIVER: Normal size and contour. GALLBLADDER AND  BILE DUCTS: Gallstones or hyperdense sludge noted within the dependent portion of the gallbladder. No gallbladder wall thickening or surrounding inflammatory change. No bile duct dilatation. SPLEEN: Normal size. No focal lesion. PANCREAS: No mass. No ductal dilatation. ADRENAL GLANDS: Normal appearance. No mass. KIDNEYS, URETERS AND BLADDER: Bosniak class 1 cyst identified within the posterior cortex of the right kidney measuring 1.1 cm, image 31/301. No follow up imaging recommended. No stones in the kidneys or ureters. No hydronephrosis. No perinephric or periureteral stranding. Urinary bladder is unremarkable. GI AND BOWEL: Small hiatal hernia. Proximal and mid small bowel loops are abnormally dilated with air-fluid levels measuring up to 4.2 cm. There is a midline ventral abdominal wall hernia where the transition from dilated proximal and mid small bowel loops to decreased caliber distal small bowel is identified. The appendix is visualized and appears normal. The colon is largely decompressed. Distal colonic diverticulosis without signs of acute diverticulitis. PERITONEUM AND RETROPERITONEUM: There is a trace amount of free fluid within the posterior pelvis. No focal fluid collections. No signs of pneumoperitoneum. VASCULATURE: Aortic atherosclerotic calcifications. LYMPH NODES: No lymphadenopathy. REPRODUCTIVE ORGANS: Calcified uterine fibroid. No adnexal mass. BONES AND SOFT TISSUES: Signs of previous PLIF at L5-S1. Lumbar degenerative disc disease and facet arthropathy. No acute osseous abnormality. No focal soft tissue abnormality. IMPRESSION: 1. Small bowel obstruction with transition point at a midline ventral abdominal wall hernia. 2. Layering gallstones or hyperdense sludge within the dependent portion of the gallbladder without wall thickening or pericholecystic inflammation. 3. Small hiatal hernia. Electronically signed by: Waddell Calk MD 12/29/2023 01:01 PM EDT RP Workstation: [id number]       Vernal Alstrom, MD  [facility] 12/31/2023  If 7PM-7AM, please contact night-coverage

## 2024-01-01 DIAGNOSIS — K56609 Unspecified intestinal obstruction, unspecified as to partial versus complete obstruction: Secondary | ICD-10-CM | POA: Diagnosis not present

## 2024-01-01 LAB — BASIC METABOLIC PANEL WITH GFR
Anion gap: 11 (ref 5–15)
BUN: 11 mg/dL (ref 8–23)
CO2: 24 mmol/L (ref 22–32)
Calcium: 8.6 mg/dL — ABNORMAL LOW (ref 8.9–10.3)
Chloride: 104 mmol/L (ref 98–111)
Creatinine, Ser: 0.8 mg/dL (ref 0.44–1.00)
GFR, Estimated: >60 mL/min (ref >60)
Glucose, Bld: 112 mg/dL — ABNORMAL HIGH (ref 70–99)
Potassium: 3.9 mmol/L (ref 3.5–5.1)
Sodium: 139 mmol/L (ref 135–145)

## 2024-01-01 LAB — CBC
HCT: 34 % — ABNORMAL LOW (ref 36.0–46.0)
Hemoglobin: 10.8 g/dL — ABNORMAL LOW (ref 12.0–15.0)
MCH: 27.8 pg (ref 26.0–34.0)
MCHC: 31.8 g/dL (ref 30.0–36.0)
MCV: 87.6 fL (ref 80.0–100.0)
Platelets: 247 K/uL (ref 150–400)
RBC: 3.88 MIL/uL (ref 3.87–5.11)
RDW: 14.9 % (ref 11.5–15.5)
WBC: 6.5 K/uL (ref 4.0–10.5)
nRBC: 0 % (ref 0.0–0.2)

## 2024-01-01 LAB — MAGNESIUM: Magnesium: 1.8 mg/dL (ref 1.7–2.4)

## 2024-01-01 LAB — PHOSPHORUS: Phosphorus: 2.4 mg/dL — ABNORMAL LOW (ref 2.5–4.6)

## 2024-01-01 MED ORDER — K PHOS MONO-SOD PHOS DI & MONO 155-852-130 MG PO TABS
500.0000 mg | ORAL_TABLET | ORAL | Status: AC
  Administered 2024-01-01 (×3): 500 mg via ORAL
  Filled 2024-01-01 (×3): qty 2

## 2024-01-01 MED ORDER — METOPROLOL TARTRATE 25 MG PO TABS
25.0000 mg | ORAL_TABLET | Freq: Two times a day (BID) | ORAL | Status: DC
  Administered 2024-01-01: 25 mg via ORAL
  Filled 2024-01-01 (×2): qty 1

## 2024-01-01 NOTE — Progress Notes (Signed)
   01/01/24 0842  TOC Brief Assessment  Insurance and Status Reviewed  Patient has primary care physician Yes  Home environment has been reviewed home alone  Prior level of function: independent  Prior/Current Home Services No current home services  Social Drivers of Health Review SDOH reviewed no interventions necessary  Readmission risk has been reviewed Yes  Transition of care needs no transition of care needs at this time

## 2024-01-01 NOTE — Progress Notes (Signed)
 PROGRESS NOTE    Rhonda [name]  FMW:[id number] DOB: [birth year] DOA: 12/29/2023 PCP: Georgina Speaks, FNP   Brief Narrative: 73 year old with past medical history significant for morbid obesity, asthma, hypertension, paroxysmal A-fib, prior SBO and volvulus, multiple abdominal surgeries in the past, presented with abdominal pain, nausea vomiting for 3 days.  CT abdomen and pelvis show SBO with transition point at a small ventral abdominal hernia and cholelithiasis without evidence of cholecystitis.  General surgery consulted.  NG tube was placed and patient was admitted for further evaluation.    Assessment & Plan:   Principal Problem:   SBO (small bowel obstruction) (HCC)   1-SBO: - Patient with past medical history significant for SBO and volvulus, has had multiple abdominal surgeries in the past. - Presenting with abdominal pain, nausea, vomiting, CT showed SBO with transition point in the small ventral abdominal wall and cholelithiasis. - She has been treated conservatively.  Had NG tube which was subsequently removed. - She has had 2 with small bowel involvement today, yesterday every time that she urinated she had a bowel movement.  Small amount mixed stool. - Surgery planning on advancing diet to full liquid and subsequently to soft   Hypercalcemia: - Continue to hold hydrochlorothiazide .  Suspect in the setting of dehydration Normalized  Hypophosphatemia: - Continue oral replacement  Essential hypertension: - As needed labetalol  - Resume metoprolol .  Hold hydrochlorothiazide  and losartan   Asthma: - Continue inhaler - Continue Breo  Hyperlipidemia: - Statins on hold plan to resume tomorrow  Class III obesity BMI 42: Lifestyle modifications       Estimated body mass index is 42.51 kg/m as calculated from the following:   Height as of this encounter: 5' 3 (1.6 m).   Weight as of this encounter: 108.9 kg.   DVT prophylaxis: Lovenox  Code Status: Full  code Family Communication: Care discussed with patient Disposition Plan:  Status is: Inpatient Remains inpatient appropriate because: Management of SBO    Consultants:  General surgery  Procedures:  None  Antimicrobials:    Subjective: She had several bowel movement yesterday a small amount loose stool, every time that she had urinated she had a bowel movement.  Today so far she had 2 small.  Denies abdominal pain.  Tolerating clear liquid diet  Objective: Vitals:   12/31/23 0726 12/31/23 1345 12/31/23 2102 01/01/24 0534  BP:  (!) 138/59 (!) 135/55 139/64  Pulse:  69 78 64  Resp:  16 16 18   Temp:  98.1 F (36.7 C) 98.4 F (36.9 C) 98.5 F (36.9 C)  TempSrc:  Oral Oral Oral  SpO2: 97% 99% 98% 100%  Weight:      Height:        Intake/Output Summary (Last 24 hours) at 01/01/2024 0756 Last data filed at 01/01/2024 0600 Gross per 24 hour  Intake 3223.86 ml  Output --  Net 3223.86 ml   Filed Weights   12/29/23 1056  Weight: 108.9 kg    Examination:  General exam: Appears calm and comfortable  Respiratory system: Clear to auscultation. Respiratory effort normal. Cardiovascular system: S1 & S2 heard, RRR. No JVD, murmurs, rubs, gallops or clicks. No pedal edema. Gastrointestinal system: Abdomen is nondistended, soft and nontender. No organomegaly or masses felt. Normal bowel sounds heard. Central nervous system: Alert and oriented. No focal neurological deficits. Extremities: Symmetric 5 x 5 power.   Data Reviewed: I have personally reviewed following labs and imaging studies  CBC: Recent Labs  Lab 12/29/23 1122 12/30/23 0535  12/31/23 0521 01/01/24 0455  WBC 9.1 5.6 6.4 6.5  HGB 14.7 13.4 11.2* 10.8*  HCT 43.7 43.0 35.8* 34.0*  MCV 84.5 91.1 87.5 87.6  PLT 339 270 266 247   Basic Metabolic Panel: Recent Labs  Lab 12/29/23 1122 12/30/23 0535 12/31/23 0521 01/01/24 0455  NA 137 140 143 139  K 4.1 3.4* 3.5 3.9  CL 94* 100 105 104  CO2 29 27 25 24    GLUCOSE 121* 109* 104* 112*  BUN 21 17 18 11   CREATININE 1.23* 0.95 0.96 0.80  CALCIUM  10.4* 9.1 8.9 8.6*  MG  --  2.3 2.0 1.8  PHOS  --   --  2.4* 2.4*   GFR: Estimated Creatinine Clearance: 73 mL/min (by C-G formula based on SCr of 0.8 mg/dL). Liver Function Tests: Recent Labs  Lab 12/29/23 1122 12/30/23 0535  AST 26 17  ALT 16 8  ALKPHOS 80 64  BILITOT 0.8 0.7  PROT 8.5* 6.8  ALBUMIN 4.8 3.7   Recent Labs  Lab 12/29/23 1122  LIPASE 29   No results for input(s): AMMONIA in the last 168 hours. Coagulation Profile: No results for input(s): INR, PROTIME in the last 168 hours. Cardiac Enzymes: No results for input(s): CKTOTAL, CKMB, CKMBINDEX, TROPONINI in the last 168 hours. BNP (last 3 results) No results for input(s): PROBNP in the last 8760 hours. HbA1C: No results for input(s): HGBA1C in the last 72 hours. CBG: No results for input(s): GLUCAP in the last 168 hours. Lipid Profile: No results for input(s): CHOL, HDL, LDLCALC, TRIG, CHOLHDL, LDLDIRECT in the last 72 hours. Thyroid  Function Tests: No results for input(s): TSH, T4TOTAL, FREET4, T3FREE, THYROIDAB in the last 72 hours. Anemia Panel: No results for input(s): VITAMINB12, FOLATE, FERRITIN, TIBC, IRON, RETICCTPCT in the last 72 hours. Sepsis Labs: No results for input(s): PROCALCITON, LATICACIDVEN in the last 168 hours.  No results found for this or any previous visit (from the past 240 hours).       Radiology Studies: DG Abd Portable 1V-Small Bowel Obstruction Protocol-initial, 8 hr delay Result Date: 12/30/2023 CLINICAL DATA:  8 hour delay for small bowel obstruction EXAM: PORTABLE ABDOMEN - 1 VIEW COMPARISON:  Abdominal x-ray 12/30/2023 FINDINGS: Contrast is seen throughout nondilated colon to the level the rectum. Mildly dilated central small bowel loops measuring 4 cm are again noted. Enteric tube is looped in the body of the stomach,  unchanged. IMPRESSION: 1. No evidence for bowel obstruction. Contrast is seen to the level the rectum. 2. Mildly dilated central small bowel loops measuring 4 cm are again noted, favored as ileus or partial small bowel obstruction. Electronically Signed   By: Greig Pique M.D.   On: 12/30/2023 20:12        Scheduled Meds:  dorzolamide -timolol   1 drop Both Eyes BID   enoxaparin  (LOVENOX ) injection  40 mg Subcutaneous Q24H   fluticasone  furoate-vilanterol  1 puff Inhalation Daily   pantoprazole  (PROTONIX ) IV  40 mg Intravenous Daily   Continuous Infusions:  dextrose  5 % and 0.45 % NaCl with KCl 20 mEq/L 100 mL/hr at 01/01/24 0200     LOS: 3 days    Time spent: 35 minutes    Kenyan Karnes A Alyssamarie Mounsey, MD [facility]   If 7PM-7AM, please contact night-coverage [email or web address]  01/01/2024, 7:56 AM

## 2024-01-01 NOTE — Plan of Care (Signed)

## 2024-01-01 NOTE — Progress Notes (Signed)
   Subjective/Chief Complaint: Doing great, no ab pain, having bowel function, tol clears   Objective: Vital signs in last 24 hours: Temp:  [98.1 F (36.7 C)-98.5 F (36.9 C)] 98.5 F (36.9 C) (09/01 0534) Pulse Rate:  [64-78] 64 (09/01 0534) Resp:  [16-18] 18 (09/01 0534) BP: (135-139)/(55-64) 139/64 (09/01 0534) SpO2:  [98 %-100 %] 100 % (09/01 0534) Last BM Date : 12/27/23  Intake/Output from previous day: 08/31 0701 - 09/01 0700 In: 3223.9 [P.O.:1140; I.V.:2030.2; IV Piggyback:53.7] Out: -  Intake/Output this shift: No intake/output data recorded.  Ab soft nontender hernia soft as well  Lab Results:  Recent Labs    12/31/23 0521 01/01/24 0455  WBC 6.4 6.5  HGB 11.2* 10.8*  HCT 35.8* 34.0*  PLT 266 247   BMET Recent Labs    12/31/23 0521 01/01/24 0455  NA 143 139  K 3.5 3.9  CL 105 104  CO2 25 24  GLUCOSE 104* 112*  BUN 18 11  CREATININE 0.96 0.80  CALCIUM  8.9 8.6*   PT/INR No results for input(s): LABPROT, INR in the last 72 hours. ABG No results for input(s): PHART, HCO3 in the last 72 hours.  Invalid input(s): PCO2, PO2  Studies/Results: DG Abd Portable 1V-Small Bowel Obstruction Protocol-initial, 8 hr delay Result Date: 12/30/2023 CLINICAL DATA:  8 hour delay for small bowel obstruction EXAM: PORTABLE ABDOMEN - 1 VIEW COMPARISON:  Abdominal x-ray 12/30/2023 FINDINGS: Contrast is seen throughout nondilated colon to the level the rectum. Mildly dilated central small bowel loops measuring 4 cm are again noted. Enteric tube is looped in the body of the stomach, unchanged. IMPRESSION: 1. No evidence for bowel obstruction. Contrast is seen to the level the rectum. 2. Mildly dilated central small bowel loops measuring 4 cm are again noted, favored as ileus or partial small bowel obstruction. Electronically Signed   By: Greig Pique M.D.   On: 12/30/2023 20:12    Anti-infectives: Anti-infectives (From admission, onward)    None        Assessment/Plan: Recurrent incisional hernia with SBO -sbo appears to be resolving clinically and radiologically   -will do fulls and adat later -I do think if this resolves she should be evaluated for elective hernia repair -lovenox  Rhonda [name] 01/01/2024

## 2024-01-02 DIAGNOSIS — K56609 Unspecified intestinal obstruction, unspecified as to partial versus complete obstruction: Secondary | ICD-10-CM | POA: Diagnosis not present

## 2024-01-02 LAB — BASIC METABOLIC PANEL WITH GFR
Anion gap: 12 (ref 5–15)
BUN: 11 mg/dL (ref 8–23)
CO2: 25 mmol/L (ref 22–32)
Calcium: 8.9 mg/dL (ref 8.9–10.3)
Chloride: 104 mmol/L (ref 98–111)
Creatinine, Ser: 0.74 mg/dL (ref 0.44–1.00)
GFR, Estimated: >60 mL/min (ref >60)
Glucose, Bld: 100 mg/dL — ABNORMAL HIGH (ref 70–99)
Potassium: 3.8 mmol/L (ref 3.5–5.1)
Sodium: 140 mmol/L (ref 135–145)

## 2024-01-02 LAB — PHOSPHORUS: Phosphorus: 3.9 mg/dL (ref 2.5–4.6)

## 2024-01-02 MED ORDER — PANTOPRAZOLE SODIUM 40 MG PO TBEC: 40.0000 mg | DELAYED_RELEASE_TABLET | Freq: Two times a day (BID) | ORAL | Status: DC

## 2024-01-02 MED ORDER — PANTOPRAZOLE SODIUM 40 MG PO TBEC: 40.0000 mg | DELAYED_RELEASE_TABLET | Freq: Every day | ORAL | Status: DC

## 2024-01-02 MED ORDER — MONTELUKAST SODIUM 10 MG PO TABS: 10.0000 mg | ORAL_TABLET | Freq: Every day | ORAL | Status: DC

## 2024-01-02 MED ORDER — PANTOPRAZOLE SODIUM 40 MG PO TBEC: 40.0000 mg | DELAYED_RELEASE_TABLET | Freq: Two times a day (BID) | ORAL | 0 refills | Status: AC

## 2024-01-02 NOTE — Plan of Care (Signed)
   Problem: Activity: Goal: Risk for activity intolerance will decrease Outcome: Progressing   Problem: Nutrition: Goal: Adequate nutrition will be maintained Outcome: Progressing   Problem: Coping: Goal: Level of anxiety will decrease Outcome: Progressing   Problem: Elimination: Goal: Will not experience complications related to bowel motility Outcome: Progressing Goal: Will not experience complications related to urinary retention Outcome: Progressing

## 2024-01-02 NOTE — Progress Notes (Signed)
       Subjective: CC: Reports she is tolerating soft diet without abdominal pain, n/v. Having BM's. Hernia at baseline.   Objective: Vital signs in last 24 hours: Temp:  [97.7 F (36.5 C)-98.3 F (36.8 C)] 98.3 F (36.8 C) (09/02 0541) Pulse Rate:  [64-74] 64 (09/02 0851) Resp:  [17-18] 18 (09/02 0541) BP: (128-136)/(52-62) 128/62 (09/02 0851) SpO2:  [100 %] 100 % (09/02 0541) Last BM Date : 01/01/24  Intake/Output from previous day: 09/01 0701 - 09/02 0700 In: 1440 [P.O.:1440] Out: 250 [Urine:250] Intake/Output this shift: Total I/O In: 220 [P.O.:220] Out: -   PE: Gen:  Alert, NAD, pleasant Abd: Soft, ND, NT. Hernia is soft, at least partially reducible and without overlying skin changes.   Lab Results:  Recent Labs    12/31/23 0521 01/01/24 0455  WBC 6.4 6.5  HGB 11.2* 10.8*  HCT 35.8* 34.0*  PLT 266 247   BMET Recent Labs    01/01/24 0455 01/02/24 0424  NA 139 140  K 3.9 3.8  CL 104 104  CO2 24 25  GLUCOSE 112* 100*  BUN 11 11  CREATININE 0.80 0.74  CALCIUM  8.6* 8.9   PT/INR No results for input(s): LABPROT, INR in the last 72 hours. CMP     Component Value Date/Time   NA 140 01/02/2024 0424   NA 140 09/20/2023 0850   K 3.8 01/02/2024 0424   CL 104 01/02/2024 0424   CO2 25 01/02/2024 0424   GLUCOSE 100 (H) 01/02/2024 0424   BUN 11 01/02/2024 0424   BUN 18 09/20/2023 0850   CREATININE 0.74 01/02/2024 0424   CALCIUM  8.9 01/02/2024 0424   PROT 6.8 12/30/2023 0535   PROT 7.1 07/06/2023 1156   ALBUMIN 3.7 12/30/2023 0535   ALBUMIN 4.1 07/06/2023 1156   AST 17 12/30/2023 0535   ALT 8 12/30/2023 0535   ALKPHOS 64 12/30/2023 0535   BILITOT 0.7 12/30/2023 0535   BILITOT 0.4 07/06/2023 1156   GFRNONAA >60 01/02/2024 0424   GFRAA 75 06/23/2020 1131   Lipase     Component Value Date/Time   LIPASE 29 12/29/2023 1122    Studies/Results: No results found.  Anti-infectives: Anti-infectives (From admission, onward)    None         Assessment/Plan Recurrent incisional hernia with SBO - SBO appears to have resolved clinically and radiologically   - Okay for discharge from our standpoint - Can follow up in our office to discuss elective repair.  - Message sent to [facility]  I reviewed nursing notes, last 24 h vitals and pain scores, last 48 h intake and output, last 24 h labs and trends, and last 24 h imaging results.     LOS: 4 days    Rhonda [name], [facility] Surgery 01/02/2024, 11:00 AM Please see Amion for pager number during day hours 7:00am-4:30pm

## 2024-01-02 NOTE — Discharge Summary (Signed)
 Physician Discharge Summary   Patient: Rhonda [name] MRN: [id number] DOB: 17-Jan-1951  Admit date:     12/29/2023  Discharge date: 01/02/24  Discharge Physician: Owen DELENA Lore   PCP: Georgina Speaks, FNP   Recommendations at discharge:   Follow up with general surgery to discuss elective abdominal ventral hernia repair.  Follow up with PCP for chronic medical problems.   Discharge Diagnoses: Principal Problem:   SBO (small bowel obstruction) (HCC)  Resolved Problems:   * No resolved hospital problems. *  Hospital Course: 74 year old with past medical history significant for morbid obesity, asthma, hypertension, paroxysmal A-fib, prior SBO and volvulus, multiple abdominal surgeries in the past, presented with abdominal pain, nausea vomiting for 3 days.  CT abdomen and pelvis show SBO with transition point at a small ventral abdominal hernia and cholelithiasis without evidence of cholecystitis.  General surgery consulted.  NG tube was placed and patient was admitted for further evaluation.    Assessment and Plan: 1-SBO: - Patient with past medical history significant for SBO and volvulus, has had multiple abdominal surgeries in the past. - Presenting with abdominal pain, nausea, vomiting, CT showed SBO with transition point in the small ventral abdominal wall and cholelithiasis. - She has been treated conservatively.  Had NG tube which was subsequently removed. - She has had 2 with small bowel involvement today, yesterday every time that she urinated she had a bowel movement.  Small amount mixed stool. -Tolerating soft diet, had BM yesterday. Passing gas today.  Stable for discharge.      Hypercalcemia: - Continue to hold hydrochlorothiazide .  Suspect in the setting of dehydration Normalized   Hypophosphatemia: - Replaced.    Essential hypertension: - As needed labetalol  - Resume metoprolol  and losartan .  Continue to hold hydrochlorothiazide   Asthma: - Continue  inhaler - Continue Breo   Hyperlipidemia: - Resume Statins at discharge   Class III obesity BMI 42: Lifestyle modifications              Consultants: General Sx Procedures performed: None Disposition: Home Diet recommendation:  Discharge Diet Orders (From admission, onward)     Start     Ordered   01/02/24 0000  Diet - low sodium heart healthy        01/02/24 1138           Cardiac diet DISCHARGE MEDICATION: Allergies as of 01/02/2024       Reactions   Oxycontin  [oxycodone  Hcl] Other (See Comments)   Hallucination   Aspirin -acetaminophen -caffeine Nausea And Vomiting   Codeine Diarrhea, Nausea And Vomiting   Stomach cramps        Medication List     STOP taking these medications    amoxicillin -clavulanate 875-125 MG tablet Commonly known as: AUGMENTIN    azithromycin  250 MG tablet Commonly known as: ZITHROMAX    benzonatate  100 MG capsule Commonly known as: Tessalon  Perles   hydrochlorothiazide  25 MG tablet Commonly known as: HYDRODIURIL    levocetirizine 5 MG tablet Commonly known as: XYZAL    lidocaine  5 % Commonly known as: Lidoderm    loperamide  2 MG capsule Commonly known as: IMODIUM        TAKE these medications    acetaminophen  650 MG CR tablet Commonly known as: TYLENOL  Take 650 mg by mouth at bedtime as needed for pain.   Airsupra  90-80 MCG/ACT Aero Generic drug: Albuterol -Budesonide Inhale 2 puffs into the lungs every 6 (six) hours as needed.   albuterol  108 (90 Base) MCG/ACT inhaler Commonly known as: VENTOLIN  HFA Inhale 2  puffs into the lungs every 6 (six) hours as needed for wheezing or shortness of breath.   Blood Glucose Monitoring Suppl Devi 1 each by Does not apply route in the morning, at noon, and at bedtime. May substitute to any manufacturer covered by patient's insurance.   Blood Pressure Cuff Misc 1 each by Does not apply route daily.   dorzolamide -timolol  2-0.5 % ophthalmic solution Commonly known as:  COSOPT  Place 1 drop into both eyes 2 (two) times daily.   fluticasone  50 MCG/ACT nasal spray Commonly known as: FLONASE  Use 1 spray(s) in each nostril once daily   fluticasone -salmeterol 250-50 MCG/ACT Aepb Commonly known as: Wixela Inhub Inhale 1 puff into the lungs in the morning and at bedtime.   furosemide  20 MG tablet Commonly known as: LASIX  Take 20 mg by mouth daily as needed.   losartan  100 MG tablet Commonly known as: COZAAR  Take 100 mg by mouth daily.   Magnesium  250 MG Tabs Take 1 tablet (250 mg total) by mouth daily.   metoprolol  tartrate 25 MG tablet Commonly known as: LOPRESSOR  Take 1 tablet by mouth twice daily   montelukast  10 MG tablet Commonly known as: SINGULAIR  Take 1 tablet (10 mg total) by mouth at bedtime.   multivitamin with minerals Tabs tablet Take 1 tablet by mouth daily.   ondansetron  4 MG tablet Commonly known as: Zofran  Take 1 tablet (4 mg total) by mouth every 8 (eight) hours as needed for nausea or vomiting.   pantoprazole  40 MG tablet Commonly known as: PROTONIX  Take 1 tablet (40 mg total) by mouth 2 (two) times daily. Start taking on: January 03, 2024   potassium chloride  10 MEQ tablet Commonly known as: KLOR-CON  Take 1 tablet (10 mEq total) by mouth daily. Take with furosemide    rosuvastatin  40 MG tablet Commonly known as: CRESTOR  Take 1 tablet (40 mg total) by mouth daily. Please call [phone] to schedule an appointment for future refills. Thank you.   VITAMIN D3 PO Take 1 tablet by mouth daily.        Follow-up Information     [facility], [facility]. Call.   Specialty: General Surgery Why: For follow up Contact information: [address] [place] [address] [phone]         Georgina Speaks, FNP Follow up in 1 week(s).   Specialty: General Practice Contact information: [address] [place] [address] [phone]                Discharge Exam: Fredricka Weights    12/29/23 1056  Weight: 108.9 kg   General; NAD  Condition at discharge: stable  The results of significant diagnostics from this hospitalization (including imaging, microbiology, ancillary and laboratory) are listed below for reference.   Imaging Studies: DG Abd Portable 1V-Small Bowel Obstruction Protocol-initial, 8 hr delay Result Date: 12/30/2023 CLINICAL DATA:  8 hour delay for small bowel obstruction EXAM: PORTABLE ABDOMEN - 1 VIEW COMPARISON:  Abdominal x-ray 12/30/2023 FINDINGS: Contrast is seen throughout nondilated colon to the level the rectum. Mildly dilated central small bowel loops measuring 4 cm are again noted. Enteric tube is looped in the body of the stomach, unchanged. IMPRESSION: 1. No evidence for bowel obstruction. Contrast is seen to the level the rectum. 2. Mildly dilated central small bowel loops measuring 4 cm are again noted, favored as ileus or partial small bowel obstruction. Electronically Signed   By: Greig Pique M.D.   On: 12/30/2023 20:12   DG Abd  Portable 1V Result Date: 12/30/2023 CLINICAL DATA:  Abdominal pain and distention. Follow-up small bowel obstruction. EXAM: PORTABLE ABDOMEN - 1 VIEW COMPARISON:  12/29/2023 FINDINGS: Nasogastric tube tip in the proximal stomach and side hole in the mid stomach. Significantly decreased bowel distention with a single residual mildly distended loop seen in the right lower abdomen. Calcified uterine fibroid. Lumbar and lower thoracic spine degenerative changes and mild scoliosis. Lumbosacral spine fixation hardware. IMPRESSION: Significantly decreased bowel distention with a single residual mildly distended loop in the right lower abdomen. Electronically Signed   By: Elspeth Bathe M.D.   On: 12/30/2023 10:06   DG Abd Portable 1 View Result Date: 12/29/2023 CLINICAL DATA:  NG tube placement. EXAM: PORTABLE ABDOMEN - 1 VIEW COMPARISON:  CT earlier today FINDINGS: Tip and side port of the enteric tube below the diaphragm in  the stomach. Gaseous bowel distention in the upper abdomen. IMPRESSION: Tip and side port of the enteric tube below the diaphragm in the stomach. Electronically Signed   By: Andrea Gasman M.D.   On: 12/29/2023 15:42   CT ABDOMEN PELVIS W CONTRAST Result Date: 12/29/2023 EXAM: CT ABDOMEN AND PELVIS WITH CONTRAST 12/29/2023 12:40:00 PM TECHNIQUE: CT of the abdomen and pelvis was performed with the administration of intravenous contrast. Multiplanar reformatted images are provided for review. Automated exposure control, iterative reconstruction, and/or weight-based adjustment of the mA/kV was utilized to reduce the radiation dose to as low as reasonably achievable. COMPARISON: None available. CLINICAL HISTORY: Abdominal pain, acute, nonlocalized; Bowel obstruction suspected. Pt says that she has been throwing up for 3 days. Hx of bowel obstruction. No bm since Wednesday. Feels gassy, passing some flatus. Has hiccups. Sharp lower pain, has a hernia as well. FINDINGS: LOWER CHEST: Visualized lung bases are clear. LIVER: Normal size and contour. GALLBLADDER AND BILE DUCTS: Gallstones or hyperdense sludge noted within the dependent portion of the gallbladder. No gallbladder wall thickening or surrounding inflammatory change. No bile duct dilatation. SPLEEN: Normal size. No focal lesion. PANCREAS: No mass. No ductal dilatation. ADRENAL GLANDS: Normal appearance. No mass. KIDNEYS, URETERS AND BLADDER: Bosniak class 1 cyst identified within the posterior cortex of the right kidney measuring 1.1 cm, image 31/301. No follow up imaging recommended. No stones in the kidneys or ureters. No hydronephrosis. No perinephric or periureteral stranding. Urinary bladder is unremarkable. GI AND BOWEL: Small hiatal hernia. Proximal and mid small bowel loops are abnormally dilated with air-fluid levels measuring up to 4.2 cm. There is a midline ventral abdominal wall hernia where the transition from dilated proximal and mid small bowel  loops to decreased caliber distal small bowel is identified. The appendix is visualized and appears normal. The colon is largely decompressed. Distal colonic diverticulosis without signs of acute diverticulitis. PERITONEUM AND RETROPERITONEUM: There is a trace amount of free fluid within the posterior pelvis. No focal fluid collections. No signs of pneumoperitoneum. VASCULATURE: Aortic atherosclerotic calcifications. LYMPH NODES: No lymphadenopathy. REPRODUCTIVE ORGANS: Calcified uterine fibroid. No adnexal mass. BONES AND SOFT TISSUES: Signs of previous PLIF at L5-S1. Lumbar degenerative disc disease and facet arthropathy. No acute osseous abnormality. No focal soft tissue abnormality. IMPRESSION: 1. Small bowel obstruction with transition point at a midline ventral abdominal wall hernia. 2. Layering gallstones or hyperdense sludge within the dependent portion of the gallbladder without wall thickening or pericholecystic inflammation. 3. Small hiatal hernia. Electronically signed by: Waddell Calk MD 12/29/2023 01:01 PM EDT RP Workstation: [id number]    Microbiology: Results for orders placed or performed during the hospital  encounter of 06/22/23  Resp panel by RT-PCR (RSV, Flu A&B, Covid) Anterior Nasal Swab     Status: None   Collection Time: 06/22/23 10:31 AM   Specimen: Anterior Nasal Swab  Result Value Ref Range Status   SARS Coronavirus 2 by RT PCR NEGATIVE NEGATIVE Final   Influenza A by PCR NEGATIVE NEGATIVE Final   Influenza B by PCR NEGATIVE NEGATIVE Final    Comment: (NOTE) The Xpert Xpress SARS-CoV-2/FLU/RSV plus assay is intended as an aid in the diagnosis of influenza from Nasopharyngeal swab specimens and should not be used as a sole basis for treatment. Nasal washings and aspirates are unacceptable for Xpert Xpress SARS-CoV-2/FLU/RSV testing.  Fact Sheet for Patients: BloggerCourse.com  Fact Sheet for Healthcare  Providers: SeriousBroker.it  This test is not yet approved or cleared by the [place]  FDA and has been authorized for detection and/or diagnosis of SARS-CoV-2 by FDA under an Emergency Use Authorization (EUA). This EUA will remain in effect (meaning this test can be used) for the duration of the COVID-19 declaration under Section 564(b)(1) of the Act, 21 U.S.C. section 360bbb-3(b)(1), unless the authorization is terminated or revoked.     Resp Syncytial Virus by PCR NEGATIVE NEGATIVE Final    Comment: (NOTE) Fact Sheet for Patients: BloggerCourse.com  Fact Sheet for Healthcare Providers: SeriousBroker.it  This test is not yet approved or cleared by the [place]  FDA and has been authorized for detection and/or diagnosis of SARS-CoV-2 by FDA under an Emergency Use Authorization (EUA). This EUA will remain in effect (meaning this test can be used) for the duration of the COVID-19 declaration under Section 564(b)(1) of the Act, 21 U.S.C. section 360bbb-3(b)(1), unless the authorization is terminated or revoked.  Performed at [facility], [address], [place], [place] [address]   C Difficile Quick Screen w PCR reflex     Status: None   Collection Time: 06/23/23  5:08 AM   Specimen: STOOL  Result Value Ref Range Status   C Diff antigen NEGATIVE NEGATIVE Final   C Diff toxin NEGATIVE NEGATIVE Final   C Diff interpretation No C. difficile detected.  Final    Comment: Performed at [facility], [address], [place], [place] [address]  Gastrointestinal Panel by PCR , Stool     Status: None   Collection Time: 06/23/23  5:08 AM   Specimen: STOOL  Result Value Ref Range Status   Campylobacter species NOT DETECTED NOT DETECTED Final   Plesimonas shigelloides NOT DETECTED NOT DETECTED Final   Salmonella species NOT DETECTED NOT DETECTED Final   Yersinia enterocolitica NOT DETECTED NOT  DETECTED Final   Vibrio species NOT DETECTED NOT DETECTED Final   Vibrio cholerae NOT DETECTED NOT DETECTED Final   Enteroaggregative E coli (EAEC) NOT DETECTED NOT DETECTED Final   Enteropathogenic E coli (EPEC) NOT DETECTED NOT DETECTED Final   Enterotoxigenic E coli (ETEC) NOT DETECTED NOT DETECTED Final   Shiga like toxin producing E coli (STEC) NOT DETECTED NOT DETECTED Final   Shigella/Enteroinvasive E coli (EIEC) NOT DETECTED NOT DETECTED Final   Cryptosporidium NOT DETECTED NOT DETECTED Final   Cyclospora cayetanensis NOT DETECTED NOT DETECTED Final   Entamoeba histolytica NOT DETECTED NOT DETECTED Final   Giardia lamblia NOT DETECTED NOT DETECTED Final   Adenovirus F40/41 NOT DETECTED NOT DETECTED Final   Astrovirus NOT DETECTED NOT DETECTED Final   Norovirus GI/GII NOT DETECTED NOT DETECTED Final   Rotavirus A NOT DETECTED NOT DETECTED Final  Sapovirus (I, II, IV, and V) NOT DETECTED NOT DETECTED Final    Comment: Performed at [facility], [address] [place], [place] [address]    Labs: CBC: Recent Labs  Lab 12/29/23 1122 12/30/23 0535 12/31/23 0521 01/01/24 0455  WBC 9.1 5.6 6.4 6.5  HGB 14.7 13.4 11.2* 10.8*  HCT 43.7 43.0 35.8* 34.0*  MCV 84.5 91.1 87.5 87.6  PLT 339 270 266 247   Basic Metabolic Panel: Recent Labs  Lab 12/29/23 1122 12/30/23 0535 12/31/23 0521 01/01/24 0455 01/02/24 0424  NA 137 140 143 139 140  K 4.1 3.4* 3.5 3.9 3.8  CL 94* 100 105 104 104  CO2 29 27 25 24 25   GLUCOSE 121* 109* 104* 112* 100*  BUN 21 17 18 11 11   CREATININE 1.23* 0.95 0.96 0.80 0.74  CALCIUM  10.4* 9.1 8.9 8.6* 8.9  MG  --  2.3 2.0 1.8  --   PHOS  --   --  2.4* 2.4* 3.9   Liver Function Tests: Recent Labs  Lab 12/29/23 1122 12/30/23 0535  AST 26 17  ALT 16 8  ALKPHOS 80 64  BILITOT 0.8 0.7  PROT 8.5* 6.8  ALBUMIN 4.8 3.7   CBG: No results for input(s): GLUCAP in the last 168 hours.  Discharge time spent: greater than 30  minutes.  Signed: Owen DELENA Lore, MD [facility] 01/02/2024

## 2024-01-03 ENCOUNTER — Telehealth: Payer: Self-pay | Admitting: *Deleted

## 2024-01-03 NOTE — Transitions of Care (Post Inpatient/ED Visit) (Signed)
   01/03/2024  Name: DEJAI SCHUBACH MRN: [id number] DOB: 25-Jan-1951  Today's TOC FU Call Status: Today's TOC FU Call Status:: Unsuccessful Call (1st Attempt) Unsuccessful Call (1st Attempt) Date: 01/03/24  Attempted to reach the patient regarding the most recent Inpatient/ED visit.  Follow Up Plan: Additional outreach attempts will be made to reach the patient to complete the Transitions of Care (Post Inpatient/ED visit) call.   Andrea Dimes RN, BSN [place]  [facility] RN Care Manager [phone]

## 2024-01-04 ENCOUNTER — Telehealth: Payer: Self-pay | Admitting: *Deleted

## 2024-01-04 NOTE — Transitions of Care (Post Inpatient/ED Visit) (Signed)
   01/04/2024  Name: Rhonda [name] MRN: [id number] DOB: 12-19-1950  Today's TOC FU Call Status: Today's TOC FU Call Status:: Unsuccessful Call (2nd Attempt) Unsuccessful Call (2nd Attempt) Date: 01/04/24  Attempted to reach the patient regarding the most recent Inpatient/ED visit.  Follow Up Plan: Additional outreach attempts will be made to reach the patient to complete the Transitions of Care (Post Inpatient/ED visit) call.   Andrea Dimes RN, BSN [place]  [facility] RN Care Manager [phone]

## 2024-01-04 NOTE — Progress Notes (Signed)
 Cardiology Office Note    Patient Name: Rhonda [name] Date of Encounter: 01/04/2024  Primary Care Provider:  Georgina Speaks, FNP Primary Cardiologist:  Vina Gull, MD Primary Electrophysiologist: None   Past Medical History    Past Medical History:  Diagnosis Date   Anemia    hx none since menopause   Arthritis    maybe in my legs (06/15/2016)   Asthma 2018   none since   Complication of anesthesia    woke up during colonoscopy 03-31-16 Dr. Timmothy   GERD (gastroesophageal reflux disease)    History of hiatal hernia    Hyperlipemia    Hypertension    Hypokalemia due to excessive gastrointestinal loss of potassium 12/02/2019   Pre-diabetes 2019   Thyroid  nodule 2010   no problems w/them since; on a pill for a while (06/15/2016)    History of Present Illness  Rhonda [name] is a 73 y.o. female with a PMH of nonobstructive CAD s/p coronary CTA 06/2021 580s paroxysmal atrial tachycardia, obesity, asthma, HTN, aortic atherosclerosis, HLD who presents today for annual follow-up.  Ms. Profitt was last seen on 12/30/2022 for annual follow-up.  During that time she with stable BP.  She was staying active and participated in the prep program at the [facility].  She reported some dizziness was associated with hypoglycemia upon awakening in the morning.  She recently was admitted to the ED with complaint of abdominal pain on 12/29/2023 and was diagnosed with a small bowel obstruction.  She had resolution of symptoms with NG tube placement not require any surgical intervention at that time.   Patient denies chest pain, palpitations, dyspnea, PND, orthopnea, nausea, vomiting, dizziness, syncope, edema, weight gain, or early satiety.   Discussed the use of AI scribe software for clinical note transcription with the patient, who gave verbal consent to proceed.  History of Present Illness    ***Notes:   Review of Systems  Please see the history of present illness.    All other  systems reviewed and are otherwise negative except as noted above.  Physical Exam    Wt Readings from Last 3 Encounters:  12/29/23 240 lb (108.9 kg)  12/11/23 241 lb (109.3 kg)  11/15/23 241 lb (109.3 kg)   CD:Uyzmz were no vitals filed for this visit.,There is no height or weight on file to calculate BMI. GEN: Well nourished, well developed in no acute distress Neck: No JVD; No carotid bruits Pulmonary: Clear to auscultation without rales, wheezing or rhonchi  Cardiovascular: Normal rate. Regular rhythm. Normal S1. Normal S2.   Murmurs: There is no murmur.  ABDOMEN: Soft, non-tender, non-distended EXTREMITIES:  No edema; No deformity   EKG/LABS/ Recent Cardiac Studies   ECG personally reviewed by me today - ***  Risk Assessment/Calculations:   {Does this patient have ATRIAL FIBRILLATION?:[phone]}      Lab Results  Component Value Date   WBC 6.5 01/01/2024   HGB 10.8 (L) 01/01/2024   HCT 34.0 (L) 01/01/2024   MCV 87.6 01/01/2024   PLT 247 01/01/2024   Lab Results  Component Value Date   CREATININE 0.74 01/02/2024   BUN 11 01/02/2024   NA 140 01/02/2024   K 3.8 01/02/2024   CL 104 01/02/2024   CO2 25 01/02/2024   Lab Results  Component Value Date   CHOL 191 09/04/2023   HDL 62 09/04/2023   LDLCALC 116 (H) 09/04/2023   TRIG 69 09/04/2023   CHOLHDL 3.1 09/04/2023    Lab Results  Component Value Date   HGBA1C 6.0 (H) 09/04/2023   Assessment & Plan    Assessment and Plan Assessment & Plan    1.  Nonobstructive CAD:  2.  Hyperlipidemia:  3.  Essential hypertension  4.  Aortic atherosclerosis:  5.  Paroxysmal atrial tachycardia:      Disposition: Follow-up with Vina Gull, MD or APP in *** months {Are you ordering a CV Procedure (e.g. stress test, cath, DCCV, TEE, etc)?   Press F2        :[id number]}   Signed, Wyn Raddle, Jackee Shove, NP 01/04/2024, 7:13 AM [facility]

## 2024-01-05 ENCOUNTER — Telehealth: Payer: Self-pay

## 2024-01-05 ENCOUNTER — Ambulatory Visit: Attending: Nurse Practitioner | Admitting: Nurse Practitioner

## 2024-01-05 ENCOUNTER — Other Ambulatory Visit (HOSPITAL_COMMUNITY): Payer: Self-pay

## 2024-01-05 ENCOUNTER — Encounter: Payer: Self-pay | Admitting: Nurse Practitioner

## 2024-01-05 VITALS — BP 140/66 | HR 68 | Ht 63.5 in | Wt 246.0 lb

## 2024-01-05 DIAGNOSIS — I4719 Other supraventricular tachycardia: Secondary | ICD-10-CM | POA: Diagnosis not present

## 2024-01-05 DIAGNOSIS — I10 Essential (primary) hypertension: Secondary | ICD-10-CM

## 2024-01-05 DIAGNOSIS — I2510 Atherosclerotic heart disease of native coronary artery without angina pectoris: Secondary | ICD-10-CM | POA: Diagnosis not present

## 2024-01-05 DIAGNOSIS — E785 Hyperlipidemia, unspecified: Secondary | ICD-10-CM

## 2024-01-05 DIAGNOSIS — R42 Dizziness and giddiness: Secondary | ICD-10-CM

## 2024-01-05 MED ORDER — OMRON 3 SERIES BP MONITOR DEVI
1.0000 | Freq: Every day | 0 refills | Status: AC
Start: 2024-01-05 — End: ?
  Filled 2024-01-05: qty 1, 30d supply, fill #0

## 2024-01-05 MED ORDER — OMRON 3 SERIES BP MONITOR DEVI: 1.0000 | Freq: Every day | 0 refills | Status: DC

## 2024-01-05 MED ORDER — METOPROLOL SUCCINATE ER 25 MG PO TB24: 25.0000 mg | ORAL_TABLET | Freq: Every day | ORAL | 1 refills | Status: DC

## 2024-01-05 NOTE — Patient Instructions (Signed)
 Medication Instructions:  STOP Metoprolol  Tartrate START Toprol  (Metoprolol  Succinate) 25mg  Take 1 tablet daily in the evenings  A prescription for a blood pressure cuff has been sent to the pharmacy downstairs. Please stop by to pick it up. The cost is approximately $35, though some insurances plans may cover it. Be sure to use the cuff regularly to monitor blood pressure as recommended by your provider.   *If you need a refill on your cardiac medications before your next appointment, please call your pharmacy*  Lab Work: TODAY-CBC & TSH If you have labs (blood work) drawn today and your tests are completely normal, you will receive your results only by: MyChart Message (if you have MyChart) OR A paper copy in the mail If you have any lab test that is abnormal or we need to change your treatment, we will call you to review the results.  Testing/Procedures: None ordered  Follow-Up: At [facility], you and your health needs are our priority.  As part of our continuing mission to provide you with exceptional heart care, our providers are all part of one team.  This team includes your primary Cardiologist (physician) and Advanced Practice Providers or APPs (Physician Assistants and Nurse Practitioners) who all work together to provide you with the care you need, when you need it.  Your next appointment:   6 month(s)  Provider:   Vina Gull, MD OR ANY APP   We recommend signing up for the patient portal called MyChart.  Sign up information is provided on this After Visit Summary.  MyChart is used to connect with patients for Virtual Visits (Telemedicine).  Patients are able to view lab/test results, encounter notes, upcoming appointments, etc.  Non-urgent messages can be sent to your provider as well.   To learn more about what you can do with MyChart, go to [email or web address].   Other Instructions

## 2024-01-05 NOTE — Transitions of Care (Post Inpatient/ED Visit) (Signed)
   01/05/2024  Name: SHUNNA MIKAELIAN MRN: [id number] DOB: 05/29/1950  Today's TOC FU Call Status: Today's TOC FU Call Status:: Unsuccessful Call (3rd Attempt) Unsuccessful Call (3rd Attempt) Date: 01/05/24  Attempted to reach the patient regarding the most recent Inpatient/ED visit.  Follow Up Plan: No further outreach attempts will be made at this time. We have been unable to contact the patient.  Adasia Hoar J. Aadil Sur RN, MSN [facility], [facility] Health RN Care Manager Direct Dial: [phone]  Fax: [phone] Website: [email or web address]

## 2024-01-05 NOTE — Addendum Note (Signed)
 Addended by: SEBASTIAN JANESE GRADE on: 01/05/2024 11:13 AM   Modules accepted: Orders

## 2024-01-06 ENCOUNTER — Ambulatory Visit: Payer: Self-pay | Admitting: Nurse Practitioner

## 2024-01-06 LAB — CBC
Hematocrit: 35.8 % (ref 34.0–46.6)
Hemoglobin: 11.3 g/dL (ref 11.1–15.9)
MCH: 28.4 pg (ref 26.6–33.0)
MCHC: 31.6 g/dL (ref 31.5–35.7)
MCV: 90 fL (ref 79–97)
Platelets: 306 x10E3/uL (ref 150–450)
RBC: 3.98 x10E6/uL (ref 3.77–5.28)
RDW: 14 % (ref 11.7–15.4)
WBC: 8 x10E3/uL (ref 3.4–10.8)

## 2024-01-06 LAB — TSH: TSH: 1.57 u[IU]/mL (ref 0.450–4.500)

## 2024-01-08 ENCOUNTER — Encounter: Payer: Self-pay | Admitting: Nurse Practitioner

## 2024-01-08 ENCOUNTER — Ambulatory Visit: Payer: Self-pay | Admitting: Nurse Practitioner

## 2024-01-08 VITALS — BP 130/60 | HR 61 | Temp 97.8°F | Ht 63.0 in | Wt 245.0 lb

## 2024-01-08 DIAGNOSIS — E782 Mixed hyperlipidemia: Secondary | ICD-10-CM

## 2024-01-08 DIAGNOSIS — I272 Pulmonary hypertension, unspecified: Secondary | ICD-10-CM | POA: Diagnosis not present

## 2024-01-08 DIAGNOSIS — Z139 Encounter for screening, unspecified: Secondary | ICD-10-CM | POA: Diagnosis not present

## 2024-01-08 DIAGNOSIS — R42 Dizziness and giddiness: Secondary | ICD-10-CM

## 2024-01-08 DIAGNOSIS — M7989 Other specified soft tissue disorders: Secondary | ICD-10-CM | POA: Diagnosis not present

## 2024-01-08 DIAGNOSIS — K56609 Unspecified intestinal obstruction, unspecified as to partial versus complete obstruction: Secondary | ICD-10-CM

## 2024-01-08 DIAGNOSIS — W19XXXA Unspecified fall, initial encounter: Secondary | ICD-10-CM

## 2024-01-08 DIAGNOSIS — Z6841 Body Mass Index (BMI) 40.0 and over, adult: Secondary | ICD-10-CM

## 2024-01-08 DIAGNOSIS — K8020 Calculus of gallbladder without cholecystitis without obstruction: Secondary | ICD-10-CM | POA: Diagnosis not present

## 2024-01-08 DIAGNOSIS — E1169 Type 2 diabetes mellitus with other specified complication: Secondary | ICD-10-CM | POA: Diagnosis not present

## 2024-01-08 DIAGNOSIS — Z23 Encounter for immunization: Secondary | ICD-10-CM

## 2024-01-08 DIAGNOSIS — E669 Obesity, unspecified: Secondary | ICD-10-CM

## 2024-01-08 NOTE — Assessment & Plan Note (Signed)
 Persistent dizziness since June 2025, primarily in the morning. Previous ENT consultation for sinus issues. Referral to ENT for further evaluation as per previous recommendation by allergy specialist. - Refer to ENT for evaluation of dizziness.

## 2024-01-08 NOTE — Assessment & Plan Note (Signed)
 Fall approximately 2 months ago while on vacation and scraped her anterior ankle. Now having swelling to her lower leg.

## 2024-01-08 NOTE — Assessment & Plan Note (Signed)
 Left leg swelling noted by cardiologist. Differential diagnosis includes deep vein thrombosis. No significant pain reported. - Order Doppler ultrasound of left leg to rule out deep vein thrombosis.  Swelling and superficial injury of right foot and ankle following a fall on December 02, 2023. Swelling persists, but no significant pain reported. Differential diagnosis includes possible fracture. - Order ankle x-ray to rule out fracture.

## 2024-01-08 NOTE — Assessment & Plan Note (Signed)
 TCM Performed. A member of the clinical team spoke with the patient upon dischare. Discharge summary was reviewed in full detail during the visit. Meds reconciled and compared to discharge meds. Medication list is updated and reviewed with the patient.  Greater than 50% face to face time was spent in counseling an coordination of care.  All questions were answered to the satisfaction of the patient.   Recent hospitalization for small bowel obstruction and ventral abdominal hernia, managed conservatively with NG tube placement. Discharged on January 02, 2024. Currently experiencing regular bowel movements with stool softener. - Continue stool softener daily. - Advise to avoid straining during bowel movements.

## 2024-01-08 NOTE — Assessment & Plan Note (Signed)
 Influenza vaccine administered Encouraged to take Tylenol as needed for fever or muscle aches.

## 2024-01-08 NOTE — Progress Notes (Signed)
 LILLETTE Kristeen JINNY Gladis, CMA,acting as a Neurosurgeon for Gaines Ada, FNP.,have documented all relevant documentation on the behalf of Gaines Ada, FNP,as directed by  Gaines Ada, FNP while in the presence of Gaines Ada, FNP.  Subjective:  Patient ID: Rhonda [name] , female    DOB: 1950-09-03 , 73 y.o.   MRN: [id number]  Chief Complaint  Patient presents with   Hospitalization Follow-up    Patient presents today for a hospital follow up, Patient reports compliance with medication. Patient denies any chest pain, SOB, or headaches. Patient was admitted on 12/29/2023 and discharged on 01/02/2024. Patient was diagnosed with SBO. Patient states today she  is feeling better.     HPI  Discussed the use of AI scribe software for clinical note transcription with the patient, who gave verbal consent to proceed.  History of Present Illness Rhonda [name] is a 73 year old female who presents for hospital follow-up after treatment for a small bowel obstruction.  She was hospitalized from August 29 to January 02, 2024, due to abdominal pain, nausea, and vomiting lasting three days. A CT scan revealed a small bowel obstruction and a small ventral abdominal hernia. Gallstones were also identified without infection. During her hospital stay, she was treated conservatively with an NG tube and had two small bowel movements before discharge. Since returning home, she is doing 'pretty good' and is taking a stool softener daily, resulting in one to two bowel movements per day.  She has a concern regarding her left leg being larger than the right, as noted by her heart specialist. She recalls a fall on December 02, 2023, while getting into a fleeta, which resulted in swelling and some skin coming off her left leg. Swelling was noticed after the fall but was not significantly bothersome. Recently, she observed a bruise on her leg without recalling any trauma. Swelling in the left leg and a bruise without known trauma are  present.  She has been experiencing dizziness since June 2025, primarily in the mornings. She has a history of sinus problems and has seen an ear, nose, and throat specialist in the past for this issue.  Her current medications include Wixela (fluticasone /salmeterol) inhaler, which she uses once in the morning and once at bedtime.   Past Medical History:  Diagnosis Date   Acute bacterial sinusitis 06/12/2015   Anemia    hx none since menopause   Arthritis    maybe in my legs (06/15/2016)   Asthma 2018   none since   Complication of anesthesia    woke up during colonoscopy 03-31-16 Dr. Timmothy   GERD (gastroesophageal reflux disease)    History of hiatal hernia    Hyperlipemia    Hypertension    Hypokalemia due to excessive gastrointestinal loss of potassium 12/02/2019   Pre-diabetes 2019   Thyroid  nodule 2010   no problems w/them since; on a pill for a while (06/15/2016)     Family History  Problem Relation Age of Onset   COPD Father    Breast cancer Neg Hx      Current Outpatient Medications:    acetaminophen  (TYLENOL ) 650 MG CR tablet, Take 650 mg by mouth at bedtime as needed for pain., Disp: , Rfl:    albuterol  (VENTOLIN  HFA) 108 (90 Base) MCG/ACT inhaler, Inhale 2 puffs into the lungs every 6 (six) hours as needed for wheezing or shortness of breath., Disp: 8 g, Rfl: 2   Albuterol -Budesonide (AIRSUPRA ) 90-80 MCG/ACT AERO, Inhale 2 puffs into  the lungs every 6 (six) hours as needed., Disp: 32.1 g, Rfl: 2   Blood Glucose Monitoring Suppl DEVI, 1 each by Does not apply route in the morning, at noon, and at bedtime. May substitute to any manufacturer covered by patient's insurance., Disp: 1 each, Rfl: 0   Blood Pressure Monitoring (BLOOD PRESSURE CUFF) MISC, 1 each by Does not apply route daily., Disp: 1 each, Rfl: 0   Blood Pressure Monitoring (OMRON 3 SERIES BP MONITOR) DEVI, Use to check blood pressure daily., Disp: 1 each, Rfl: 0   Cholecalciferol (VITAMIN D3 PO), Take  1 tablet by mouth daily., Disp: , Rfl:    dorzolamide -timolol  (COSOPT ) 22.3-6.8 MG/ML ophthalmic solution, Place 1 drop into both eyes 2 (two) times daily. , Disp: , Rfl:    fluticasone  (FLONASE ) 50 MCG/ACT nasal spray, Use 1 spray(s) in each nostril once daily, Disp: 16 g, Rfl: 0   fluticasone -salmeterol (WIXELA INHUB) 250-50 MCG/ACT AEPB, Inhale 1 puff into the lungs in the morning and at bedtime., Disp: 60 each, Rfl: 5   furosemide  (LASIX ) 20 MG tablet, Take 20 mg by mouth daily as needed., Disp: , Rfl:    losartan  (COZAAR ) 100 MG tablet, Take 100 mg by mouth daily., Disp: , Rfl:    Magnesium  250 MG TABS, Take 1 tablet (250 mg total) by mouth daily., Disp: 30 tablet, Rfl: 0   metoprolol  succinate (TOPROL  XL) 25 MG 24 hr tablet, Take 1 tablet (25 mg total) by mouth daily., Disp: 30 tablet, Rfl: 1   montelukast  (SINGULAIR ) 10 MG tablet, Take 1 tablet (10 mg total) by mouth at bedtime., Disp: 30 tablet, Rfl: 5   Multiple Vitamin (MULTIVITAMIN WITH MINERALS) TABS tablet, Take 1 tablet by mouth daily., Disp: , Rfl:    ondansetron  (ZOFRAN ) 4 MG tablet, Take 1 tablet (4 mg total) by mouth every 8 (eight) hours as needed for nausea or vomiting., Disp: 20 tablet, Rfl: 0   pantoprazole  (PROTONIX ) 40 MG tablet, Take 1 tablet (40 mg total) by mouth 2 (two) times daily., Disp: 60 tablet, Rfl: 0   potassium chloride  (KLOR-CON ) 10 MEQ tablet, Take 1 tablet (10 mEq total) by mouth daily. Take with furosemide , Disp: 90 tablet, Rfl: 2   rosuvastatin  (CRESTOR ) 40 MG tablet, Take 1 tablet (40 mg total) by mouth daily. Please call [phone] to schedule an appointment for future refills. Thank you., Disp: 30 tablet, Rfl: 0   Allergies  Allergen Reactions   Oxycontin  [Oxycodone  Hcl] Other (See Comments)    Hallucination    Aspirin -Acetaminophen -Caffeine Nausea And Vomiting   Codeine Diarrhea and Nausea And Vomiting    Stomach cramps      Review of Systems  Constitutional: Negative.   HENT:  Negative for  congestion, sinus pressure, sinus pain and sore throat.   Eyes: Negative.   Respiratory: Negative.  Negative for shortness of breath and wheezing.   Cardiovascular: Negative.   Gastrointestinal: Negative.   Musculoskeletal: Negative.   Neurological: Negative.   Psychiatric/Behavioral: Negative.       Today's Vitals   01/08/24 1042  BP: 130/60  Pulse: 61  Temp: 97.8 F (36.6 C)  TempSrc: Oral  Weight: 245 lb (111.1 kg)  Height: 5' 3 (1.6 m)  PainSc: 0-No pain   Body mass index is 43.4 kg/m.  Wt Readings from Last 3 Encounters:  01/08/24 245 lb (111.1 kg)  01/05/24 246 lb (111.6 kg)  12/29/23 240 lb (108.9 kg)     Objective:  Physical Exam Vitals and nursing  note reviewed.  Constitutional:      General: She is not in acute distress.    Appearance: Normal appearance. She is obese.  HENT:     Mouth/Throat:     Comments: Right oropharynx is slightly enlarged.  Cardiovascular:     Rate and Rhythm: Normal rate and regular rhythm.     Pulses: Normal pulses.     Heart sounds: Normal heart sounds. No murmur heard. Pulmonary:     Effort: Pulmonary effort is normal. No respiratory distress.     Breath sounds: Normal breath sounds. No wheezing.  Abdominal:     General: Bowel sounds are normal. There is no distension.     Palpations: Abdomen is soft.     Tenderness: There is no abdominal tenderness.     Hernia: A hernia is present.  Musculoskeletal:        General: No signs of injury. Normal range of motion.     Right lower leg: No edema.     Left lower leg: No edema.  Skin:    General: Skin is warm and dry.     Capillary Refill: Capillary refill takes less than 2 seconds.  Neurological:     General: No focal deficit present.     Mental Status: She is alert and oriented to person, place, and time.     Cranial Nerves: No cranial nerve deficit.     Motor: No weakness.  Psychiatric:        Mood and Affect: Mood normal.        Behavior: Behavior normal.        Thought  Content: Thought content normal.        Judgment: Judgment normal.      Assessment And Plan:  SBO (small bowel obstruction) (HCC) Assessment & Plan: TCM Performed. A member of the clinical team spoke with the patient upon dischare. Discharge summary was reviewed in full detail during the visit. Meds reconciled and compared to discharge meds. Medication list is updated and reviewed with the patient.  Greater than 50% face to face time was spent in counseling an coordination of care.  All questions were answered to the satisfaction of the patient.   Recent hospitalization for small bowel obstruction and ventral abdominal hernia, managed conservatively with NG tube placement. Discharged on January 02, 2024. Currently experiencing regular bowel movements with stool softener. - Continue stool softener daily. - Advise to avoid straining during bowel movements.   Pulmonary hypertension (HCC) Assessment & Plan: Blood pressure is increased slightly, continue current medications.    Mixed hyperlipidemia Assessment & Plan: Cholesterol levels are stable, continue current medications   Type 2 diabetes mellitus with obesity (HCC) Assessment & Plan: HgbA1c is stable, continue current medications  Orders: -     Hemoglobin A1c  Fall, initial encounter Assessment & Plan: Fall approximately 2 months ago while on vacation and scraped her anterior ankle. Now having swelling to her lower leg.  Orders: -     DG Ankle Complete Left; Future  Left leg swelling Assessment & Plan: Left leg swelling noted by cardiologist. Differential diagnosis includes deep vein thrombosis. No significant pain reported. - Order Doppler ultrasound of left leg to rule out deep vein thrombosis.  Swelling and superficial injury of right foot and ankle following a fall on December 02, 2023. Swelling persists, but no significant pain reported. Differential diagnosis includes possible fracture. - Order ankle x-ray to rule out  fracture.  Orders: -     VAS US   LOWER EXTREMITY VENOUS (DVT); Future  Dizziness Assessment & Plan: Persistent dizziness since June 2025, primarily in the morning. Previous ENT consultation for sinus issues. Referral to ENT for further evaluation as per previous recommendation by allergy specialist. - Refer to ENT for evaluation of dizziness.  Orders: -     Ambulatory referral to ENT  Encounter for screening -     Hepatitis B surface antibody,qualitative  Need for influenza vaccination Assessment & Plan: Influenza vaccine administered Encouraged to take Tylenol  as needed for fever or muscle aches.   Orders: -     Flu vaccine HIGH DOSE PF(Fluzone Trivalent)  BMI 40.0-44.9, adult [facility]) Assessment & Plan: She is encouraged to strive for BMI less than 30 to decrease cardiac risk. Advised to aim for at least 150 minutes of exercise per week.    Calculus of gallbladder without cholecystitis without obstruction Assessment & Plan: Gallstones identified during recent hospitalization without signs of cholecystitis or infection. Managed conservatively. - Follow-up with general surgery in October.      Return for controlled DM check 4 months: 2 weeks covid vaccine.  Patient was given opportunity to ask questions. Patient verbalized understanding of the plan and was able to repeat key elements of the plan. All questions were answered to their satisfaction.    LILLETTE Gaines Ada, FNP, have reviewed all documentation for this visit. The documentation on 01/08/24 for the exam, diagnosis, procedures, and orders are all accurate and complete.   IF YOU HAVE BEEN REFERRED TO A SPECIALIST, IT MAY TAKE 1-2 WEEKS TO SCHEDULE/PROCESS THE REFERRAL. IF YOU HAVE NOT HEARD FROM US /SPECIALIST IN TWO WEEKS, PLEASE GIVE US  A CALL AT [phone].

## 2024-01-08 NOTE — Assessment & Plan Note (Signed)
 Gallstones identified during recent hospitalization without signs of cholecystitis or infection. Managed conservatively. - Follow-up with general surgery in October.

## 2024-01-09 ENCOUNTER — Ambulatory Visit: Payer: Self-pay | Admitting: Nurse Practitioner

## 2024-01-09 ENCOUNTER — Ambulatory Visit
Admission: RE | Admit: 2024-01-09 | Discharge: 2024-01-09 | Disposition: A | Discharge status: Home / Self Care | Source: Ambulatory Visit | Attending: Nurse Practitioner | Admitting: Nurse Practitioner

## 2024-01-09 DIAGNOSIS — W19XXXA Unspecified fall, initial encounter: Secondary | ICD-10-CM

## 2024-01-09 DIAGNOSIS — M25572 Pain in left ankle and joints of left foot: Secondary | ICD-10-CM | POA: Diagnosis not present

## 2024-01-09 LAB — HEMOGLOBIN A1C
Est. average glucose Bld gHb Est-mCnc: 140 mg/dL
Hgb A1c MFr Bld: 6.5 % — ABNORMAL HIGH (ref 4.8–5.6)

## 2024-01-09 LAB — HEPATITIS B SURFACE ANTIBODY,QUALITATIVE: Hep B Surface Ab, Qual: REACTIVE

## 2024-01-12 ENCOUNTER — Ambulatory Visit (HOSPITAL_COMMUNITY)
Admission: RE | Admit: 2024-01-12 | Discharge: 2024-01-12 | Disposition: A | Discharge status: Home / Self Care | Source: Ambulatory Visit | Attending: Nurse Practitioner | Admitting: Nurse Practitioner

## 2024-01-12 DIAGNOSIS — M7989 Other specified soft tissue disorders: Secondary | ICD-10-CM | POA: Diagnosis not present

## 2024-01-17 NOTE — Assessment & Plan Note (Signed)
 She is encouraged to strive for BMI less than 30 to decrease cardiac risk. Advised to aim for at least 150 minutes of exercise per week.

## 2024-01-17 NOTE — Assessment & Plan Note (Signed)
HgbA1c is stable, continue current medications

## 2024-01-17 NOTE — Assessment & Plan Note (Signed)
 Cholesterol levels are stable, continue current medications.

## 2024-01-17 NOTE — Assessment & Plan Note (Signed)
 Blood pressure is increased slightly, continue current medications.

## 2024-01-23 ENCOUNTER — Ambulatory Visit

## 2024-01-23 VITALS — BP 118/74 | HR 67 | Temp 98.1°F | Ht 63.0 in | Wt 245.0 lb

## 2024-01-23 DIAGNOSIS — E1169 Type 2 diabetes mellitus with other specified complication: Secondary | ICD-10-CM

## 2024-01-23 NOTE — Progress Notes (Signed)
 Patient presents today for Ozempic teaching. She is to start on 0.25MG  today. She will inject once weekly.  Patient aware injection days are on Tuesday.

## 2024-01-23 NOTE — Patient Instructions (Addendum)
 Type 2 Diabetes Mellitus, Diagnosis, Adult Type 2 diabetes (type 2 diabetes mellitus) is a long-term (chronic) disease. It may happen when there is one or both of these problems: The pancreas does not make enough insulin . The body does not react in a normal way to insulin  that it makes. Insulin  lets sugars go into cells in your body. If you have type 2 diabetes, sugars cannot get into your cells. Sugars build up in the blood. This causes high blood sugar. What are the causes? The exact cause of this condition is not known. What increases the risk? Having type 2 diabetes in your family. Being overweight or very overweight. Not being active. Your body not reacting in a normal way to the insulin  it makes. Having higher than normal blood sugar over time. Having a type of diabetes when you were pregnant. Having a condition that causes small fluid-filled sacs on your ovaries. What are the signs or symptoms? At first, you may have no symptoms. You will get symptoms slowly. They may include: More thirst than normal. More hunger than normal. Needing to pee more than normal. Losing weight without trying. Feeling tired. Feeling weak. Seeing things blurry. Dark patches on your skin. How is this treated? This condition may be treated by a diabetes expert. You may need to: Follow an eating plan made by a food expert (dietitian). Get regular exercise. Find ways to deal with stress. Check blood sugar as often as told. Take medicines. Your doctor will set treatment goals for you. Your blood sugar should be at these levels: Before meals: 80-130 mg/dL (4.4-7.2 mmol/L). After meals: below 180 mg/dL (10 mmol/L). Over the last 2-3 months: less than 7%. Follow these instructions at home: Medicines Take your diabetes medicines or insulin  every day. Take medicines as told to help you prevent other problems caused by this condition. You may need: Aspirin . Medicine to lower cholesterol. Medicine to  control blood pressure. Questions to ask your doctor Should I meet with a diabetes educator? What medicines do I need, and when should I take them? What will I need to treat my condition at home? When should I check my blood sugar? Where can I find a support group? Who can I call if I have questions? When is my next doctor visit? General instructions Take over-the-counter and prescription medicines only as told by your doctor. Keep all follow-up visits. Where to find more information For help and guidance and more information about diabetes, please go to: American Diabetes Association (ADA): www.diabetes.org American Association of Diabetes Care and Education Specialists (ADCES): www.diabeteseducator.org International Diabetes Federation (IDF): DCOnly.dk Contact a doctor if: Your blood sugar is at or above 240 mg/dL (86.6 mmol/L) for 2 days in a row. You have been sick for 2 days or more, and you are not getting better. You have had a fever for 2 days or more, and you are not getting better. You have any of these problems for more than 6 hours: You cannot eat or drink. You feel like you may vomit. You vomit. You have watery poop (diarrhea). Get help right away if: Your blood sugar is lower than 54 mg/dL (3 mmol/L). You feel mixed up (confused). You have trouble thinking clearly. You have trouble breathing. You have medium or large ketone levels in your pee. These symptoms may be an emergency. Get help right away. Call your local emergency services (911 in the [place] Do not wait to see if the symptoms will go away. Do not drive yourself  to the hospital. Summary Type 2 diabetes is a long-term disease. Your pancreas may not make enough insulin , or your body may not react in a normal way to insulin  that it makes. This condition is treated with an eating plan, lifestyle changes, and medicines. Your doctor will set treatment goals for you. These will help you keep your blood sugar  in a healthy range. Keep all follow-up visits. This information is not intended to replace advice given to you by your health care provider. Make sure you discuss any questions you have with your health care provider. Document Revised: 07/13/2020 Document Reviewed: 07/13/2020 Elsevier Patient Education  2024 Elsevier Inc.  Diabetes: Healthy Eating for Adults When you have diabetes, also called diabetes mellitus, it's important to have healthy eating habits. Your blood sugar (glucose) levels are greatly affected by what you eat and drink. You need to eat healthy foods in the right amounts, at about the same times each day. Doing this can help you: Manage your blood sugar. Lower your risk of heart disease. Improve your blood pressure. Reach or stay at a healthy weight. What can affect my meal plan? Every person with diabetes is different. And each person has different needs for a meal plan. Your health care provider may suggest that you work with an expert in healthy eating called a dietitian. They can help you make a meal plan that's best for you. How do carbohydrates affect me? Carbohydrates, also called carbs, affect your blood sugar level more than any other type of food. Eating carbs raises the amount of sugar in your blood. It's important to know how many carbs you can safely have in each meal. This is different for every person. Your dietitian can help you calculate how many carbs you should have at each meal and for each snack. How does alcohol affect me? Alcohol can cause a decrease in blood sugar (hypoglycemia), especially if you use insulin  or take certain diabetes medicines by mouth. Hypoglycemia can be a life-threatening condition. Symptoms of hypoglycemia are similar to those of having too much alcohol. They include confusion, being sleepy, and feeling dizzy. Do not drink alcohol if: Your provider tells you not to drink. You're pregnant, may be pregnant, or plan to become  pregnant. What are tips for following this plan? Reading food labels Start by checking the serving size on the Nutrition Facts label of packaged foods and drinks. The number of calories and the amount of carbs, fats, and other nutrients listed on the label are based on one serving of the item. Many items contain more than one serving per package. Check the total grams (g) of carbs in one serving. Check the number of grams of saturated fats and trans fats in one serving. Choose foods that have a low amount or none of these fats. Check the number of milligrams (mg) of salt (sodium) in one serving. Most people should limit their total sodium intake to less than 2,300 mg per day. Always check the nutrition information of foods labeled as low-fat or nonfat. These foods may be higher in added sugar or refined carbs and should be avoided. Talk to your dietitian to identify your daily goals for nutrients listed on the label. Shopping Avoid buying canned, pre-made, or processed foods. These foods tend to be high in fat, sodium, and added sugar. Shop around the outside edge of the grocery store. This is where you'll most often find fresh fruits and vegetables, bulk grains, fresh meats, and fresh dairy products.  Cooking Use low-heat cooking methods, such as baking, instead of high-heat methods like deep frying. Cook using healthy oils, such as olive, canola, or sunflower oil. Avoid cooking with butter, cream, or high-fat meats. Meal planning  Eat meals and snacks regularly. Try to eat them at the same times every day. Avoid going too long without eating. Eat foods that are high in fiber, such as fresh fruits, vegetables, beans, and whole grains. Eat 4-6 oz (112-168 g) of lean protein each day, such as lean meat, chicken, fish, eggs, or tofu. One ounce (oz) (28 g) of lean protein is equal to: 1 oz (28 g) of meat, chicken, or fish. 1 egg.  cup (62 g) of tofu. Eat some foods each day that contain  healthy fats, such as avocado, nuts, seeds, and fish. What foods should I eat? Fruits Berries. Apples. Oranges. Peaches. Apricots. Plums. Grapes. Mangoes. Papayas. Pomegranates. Kiwi. Cherries. Vegetables Leafy greens, including lettuce, spinach, kale, chard, collard greens, mustard greens, and cabbage. Beets. Cauliflower. Broccoli. Carrots. Green beans. Tomatoes. Peppers. Onions. Cucumbers. Brussels sprouts. Grains Whole grains, such as whole-wheat or whole-grain bread, crackers, tortillas, cereal, and pasta. Unsweetened oatmeal. Quinoa. Brown or wild rice. Meats and other proteins Seafood. Poultry without skin. Lean cuts of poultry and beef. Tofu. Nuts. Seeds. Dairy Low-fat or fat-free dairy products such as milk, yogurt, and cheese. The items listed above may not be all the foods and drinks you can have. Talk with a dietitian to learn more. What foods should I avoid? Fruits Fruits canned with syrup. Vegetables Canned vegetables. Frozen vegetables with butter or cream sauce. Grains Refined white flour and flour products such as bread, pasta, snack foods, and cereals. Avoid all processed foods. Meats and other proteins Fatty cuts of meat. Poultry with skin. Breaded or fried meats. Processed meat. Avoid saturated fats. Dairy Full-fat yogurt, cheese, or milk. Beverages Sweetened drinks, such as soda or iced tea. The items listed above may not be all the foods and drinks you should avoid. Talk with a dietitian to learn more. Where to find more information: To learn more, go to: Academy of Nutrition and Dietetics at DeathPrevention.it. Click Search and type diabetes. Find the link you need. Centers for Disease Control and Prevention at TonerPromos.no. Click Search and type diabetes. Find the link you need. American Diabetes Association: diabetes.org/food-nutrition [employer] of Diabetes and Digestive and Kidney Diseases: StageSync.si This information is not intended to replace  advice given to you by your health care provider. Make sure you discuss any questions you have with your health care provider. Document Revised: 04/06/2023 Document Reviewed: 04/06/2023 Elsevier Patient Education  2025 [employer].

## 2024-01-31 DIAGNOSIS — K432 Incisional hernia without obstruction or gangrene: Secondary | ICD-10-CM | POA: Diagnosis not present

## 2024-02-05 ENCOUNTER — Other Ambulatory Visit: Payer: Self-pay | Admitting: Nurse Practitioner

## 2024-02-07 ENCOUNTER — Telehealth: Payer: Self-pay

## 2024-02-07 NOTE — Telephone Encounter (Signed)
 Copied from CRM #[id number]. Topic: Clinical - Medication Question >> Feb 06, 2024 12:21 PM Fonda T wrote: Reason for CRM: Patient is calling, requesting to speak directly to office Alica) regarding how to administer herself injections, Ozempic.  Per patient is confused on how she should be giving herself the injection.   Called and spoke to office, advised to send a clinical CRM and office will reach out to patient to advise further.   Can be reached at [phone]  Patient is aware of same day call back.   LVM for patient - CM, CMA.

## 2024-02-08 ENCOUNTER — Other Ambulatory Visit: Payer: Self-pay

## 2024-02-08 NOTE — Telephone Encounter (Signed)
 The patient called in returning a call to [place] and I spoke with Shivonne and she had me transfer the patient to [facility]'s line. Please assist patient further

## 2024-02-13 ENCOUNTER — Ambulatory Visit: Admitting: Allergy and Immunology

## 2024-02-18 ENCOUNTER — Other Ambulatory Visit: Payer: Self-pay | Admitting: Nurse Practitioner

## 2024-02-21 ENCOUNTER — Other Ambulatory Visit: Payer: Self-pay | Admitting: Nurse Practitioner

## 2024-02-21 ENCOUNTER — Ambulatory Visit: Admitting: Nurse Practitioner

## 2024-02-21 DIAGNOSIS — I272 Pulmonary hypertension, unspecified: Secondary | ICD-10-CM

## 2024-02-26 ENCOUNTER — Other Ambulatory Visit: Payer: Self-pay | Admitting: Nurse Practitioner

## 2024-02-26 DIAGNOSIS — I272 Pulmonary hypertension, unspecified: Secondary | ICD-10-CM

## 2024-02-28 ENCOUNTER — Encounter: Payer: Self-pay | Admitting: Nurse Practitioner

## 2024-02-28 ENCOUNTER — Ambulatory Visit: Admitting: Nurse Practitioner

## 2024-02-28 VITALS — BP 130/78 | HR 70 | Temp 98.5°F | Ht 63.0 in | Wt 242.6 lb

## 2024-02-28 DIAGNOSIS — E669 Obesity, unspecified: Secondary | ICD-10-CM

## 2024-02-28 DIAGNOSIS — E782 Mixed hyperlipidemia: Secondary | ICD-10-CM

## 2024-02-28 DIAGNOSIS — E1169 Type 2 diabetes mellitus with other specified complication: Secondary | ICD-10-CM

## 2024-02-28 MED ORDER — BLOOD GLUCOSE MONITORING SUPPL DEVI: 1.0000 | Freq: Three times a day (TID) | 0 refills | Status: AC

## 2024-02-28 MED ORDER — OZEMPIC (0.25 OR 0.5 MG/DOSE) 2 MG/3ML ~~LOC~~ SOPN: 0.5000 mg | PEN_INJECTOR | SUBCUTANEOUS | 1 refills | Status: AC

## 2024-02-28 NOTE — Progress Notes (Signed)
 LILLETTE Kristeen JINNY Gladis, CMA,acting as a neurosurgeon for Rhonda Ada, FNP.,have documented all relevant documentation on the behalf of Rhonda Ada, FNP,as directed by  Rhonda Ada, FNP while in the presence of Rhonda Ada, FNP.  Subjective:  Patient ID: Rhonda [name] , female    DOB: 20-Mar-1951 , 73 y.o.   MRN: [id number]  Chief Complaint  Patient presents with   Diabetes    Patient presents today for a bp and dm follow up, Patient reports compliance with medication. Patient denies any chest pain, SOB, or headaches. Patient recently started ozempic and reports no issues.      HPI  Discussed the use of AI scribe software for clinical note transcription with the patient, who gave verbal consent to proceed.  History of Present Illness Rhonda [name] is a 73 year old female with diabetes who presents for a follow-up regarding her Ozempic medication.  She has been using Ozempic to manage her diabetes and reports feeling 'very sweaty'. No nausea or constipation. She takes a stool softener and consumes fiber to manage bowel movements, using prunes if she feels sluggish.  She last took her Ozempic dose on Wednesday, February 21, 2024, after missing her usual Tuesday dose due to a trip to [place]  with her grandchildren. She has not picked up her prescription as it was not sent in, but she was given a sample previously.  She has noticed a decrease in appetite and has lost three pounds since starting Ozempic.  Regarding her diabetes management, her A1c has fluctuated in the past, reaching 6.5 before coming down and then rising again.  She has a history of eye care with Dr. Curley, who retired, and was referred to Dr. Jens. She has an appointment scheduled with Dr. Jens, but it is a year out.   Past Medical History:  Diagnosis Date   Acute bacterial sinusitis 06/12/2015   Anemia    hx none since menopause   Arthritis    maybe in my legs (06/15/2016)   Asthma 2018   none since    Complication of anesthesia    woke up during colonoscopy 03-31-16 Dr. Timmothy   GERD (gastroesophageal reflux disease)    History of hiatal hernia    Hyperlipemia    Hypertension    Hypokalemia due to excessive gastrointestinal loss of potassium 12/02/2019   Pre-diabetes 2019   Thyroid  nodule 2010   no problems w/them since; on a pill for a while (06/15/2016)     Family History  Problem Relation Age of Onset   COPD Father    Breast cancer Neg Hx      Current Outpatient Medications:    acetaminophen  (TYLENOL ) 650 MG CR tablet, Take 650 mg by mouth at bedtime as needed for pain., Disp: , Rfl:    albuterol  (VENTOLIN  HFA) 108 (90 Base) MCG/ACT inhaler, Inhale 2 puffs into the lungs every 6 (six) hours as needed for wheezing or shortness of breath., Disp: 8 g, Rfl: 2   Albuterol -Budesonide (AIRSUPRA ) 90-80 MCG/ACT AERO, Inhale 2 puffs into the lungs every 6 (six) hours as needed., Disp: 32.1 g, Rfl: 2   Blood Pressure Monitoring (BLOOD PRESSURE CUFF) MISC, 1 each by Does not apply route daily., Disp: 1 each, Rfl: 0   Blood Pressure Monitoring (OMRON 3 SERIES BP MONITOR) DEVI, Use to check blood pressure daily., Disp: 1 each, Rfl: 0   Cholecalciferol (VITAMIN D3 PO), Take 1 tablet by mouth daily., Disp: , Rfl:    dorzolamide -timolol  (COSOPT ) 22.3-6.8  MG/ML ophthalmic solution, Place 1 drop into both eyes 2 (two) times daily. , Disp: , Rfl:    fluticasone  (FLONASE ) 50 MCG/ACT nasal spray, Use 1 spray(s) in each nostril once daily, Disp: 16 g, Rfl: 0   fluticasone -salmeterol (WIXELA INHUB) 250-50 MCG/ACT AEPB, Inhale 1 puff into the lungs in the morning and at bedtime., Disp: 60 each, Rfl: 5   furosemide  (LASIX ) 20 MG tablet, Take 20 mg by mouth daily as needed., Disp: , Rfl:    Magnesium  250 MG TABS, Take 1 tablet (250 mg total) by mouth daily., Disp: 30 tablet, Rfl: 0   metoprolol  succinate (TOPROL -XL) 25 MG 24 hr tablet, Take 1 tablet by mouth once daily, Disp: 90 tablet, Rfl: 3    montelukast  (SINGULAIR ) 10 MG tablet, Take 1 tablet (10 mg total) by mouth at bedtime., Disp: 30 tablet, Rfl: 5   Multiple Vitamin (MULTIVITAMIN WITH MINERALS) TABS tablet, Take 1 tablet by mouth daily., Disp: , Rfl:    ondansetron  (ZOFRAN ) 4 MG tablet, Take 1 tablet (4 mg total) by mouth every 8 (eight) hours as needed for nausea or vomiting., Disp: 20 tablet, Rfl: 0   pantoprazole  (PROTONIX ) 40 MG tablet, Take 1 tablet (40 mg total) by mouth 2 (two) times daily., Disp: 60 tablet, Rfl: 0   potassium chloride  (KLOR-CON ) 10 MEQ tablet, Take 1 tablet (10 mEq total) by mouth daily. Take with furosemide , Disp: 90 tablet, Rfl: 2   rosuvastatin  (CRESTOR ) 40 MG tablet, Take 1 tablet (40 mg total) by mouth daily. Please call [phone] to schedule an appointment for future refills. Thank you., Disp: 30 tablet, Rfl: 0   Semaglutide,0.25 or 0.5MG /DOS, (OZEMPIC, 0.25 OR 0.5 MG/DOSE,) 2 MG/3ML SOPN, Inject 0.5 mg into the skin once a week., Disp: 9 mL, Rfl: 1   Blood Glucose Monitoring Suppl DEVI, 1 each by Does not apply route in the morning, at noon, and at bedtime. May substitute to any manufacturer covered by patient's insurance., Disp: 1 each, Rfl: 0   losartan  (COZAAR ) 100 MG tablet, Take 1 tablet (100 mg total) by mouth daily., Disp: 90 tablet, Rfl: 0   Allergies  Allergen Reactions   Oxycontin  [Oxycodone  Hcl] Other (See Comments)    Hallucination    Aspirin -Acetaminophen -Caffeine Nausea And Vomiting   Codeine Diarrhea and Nausea And Vomiting    Stomach cramps      Review of Systems  Constitutional: Negative.   HENT:  Negative for ear pain.   Respiratory:  Negative for cough.   Cardiovascular: Negative.   Neurological: Negative.   Psychiatric/Behavioral: Negative.    All other systems reviewed and are negative.    Today's Vitals   02/28/24 0904  BP: 130/78  Pulse: 70  Temp: 98.5 F (36.9 C)  TempSrc: Oral  Weight: 242 lb 9.6 oz (110 kg)  Height: 5' 3 (1.6 m)   Body mass index  is 42.97 kg/m.  Wt Readings from Last 3 Encounters:  02/28/24 242 lb 9.6 oz (110 kg)  01/23/24 245 lb (111.1 kg)  01/08/24 245 lb (111.1 kg)      Objective:  Physical Exam Vitals and nursing note reviewed.  Constitutional:      General: She is not in acute distress.    Appearance: Normal appearance. She is obese.  Cardiovascular:     Rate and Rhythm: Normal rate and regular rhythm.     Pulses: Normal pulses.     Heart sounds: Normal heart sounds. No murmur heard. Pulmonary:     Effort: Pulmonary  effort is normal. No respiratory distress.     Breath sounds: Normal breath sounds. No wheezing.  Neurological:     General: No focal deficit present.     Mental Status: She is alert and oriented to person, place, and time.     Cranial Nerves: No cranial nerve deficit.     Motor: No weakness.  Psychiatric:        Mood and Affect: Mood normal.        Behavior: Behavior normal.        Thought Content: Thought content normal.        Judgment: Judgment normal.      Diabetic foot exam was performed with the following findings:   No deformities, ulcerations, or other skin breakdown Normal sensation of 10g monofilament Intact posterior tibialis and dorsalis pedis pulses     Assessment And Plan:  Type 2 diabetes mellitus in patient with obesity (HCC) Assessment & Plan: A1c previously reached 6.5. Ozempic prescribed for diabetes management and weight loss. No nausea or constipation reported. Discussed how Ozempic reduces appetite and may lower cardiac death and stroke risk. - Send prescription for Ozempic. - Provide sample of Ozempic until prescription is filled. - Advise stool softener if constipation occurs. - Encourage regular bowel movements, use prunes if needed. - Monitor A1c every 3-4 months. - Encourage protein intake and light exercise.  Eye exam overdue. Discussed importance of regular eye exams and foot checks to prevent complications. - Schedule mammogram. - Schedule  eye exam with Dr. Jens' sons if Dr. Jens unavailable. - Advise regular foot checks, avoid walking barefoot. Diabetic foot exam done   Other orders -     Ozempic (0.25 or 0.5 MG/DOSE); Inject 0.5 mg into the skin once a week.  Dispense: 9 mL; Refill: 1 -     Blood Glucose Monitoring Suppl; 1 each by Does not apply route in the morning, at noon, and at bedtime. May substitute to any manufacturer covered by patient's insurance.  Dispense: 1 each; Refill: 0    Return for keep same next.  Patient was given opportunity to ask questions. Patient verbalized understanding of the plan and was able to repeat key elements of the plan. All questions were answered to their satisfaction.    LILLETTE Rhonda Ada, FNP, have reviewed all documentation for this visit. The documentation on 02/28/24 for the exam, diagnosis, procedures, and orders are all accurate and complete.   IF YOU HAVE BEEN REFERRED TO A SPECIALIST, IT MAY TAKE 1-2 WEEKS TO SCHEDULE/PROCESS THE REFERRAL. IF YOU HAVE NOT HEARD FROM US /SPECIALIST IN TWO WEEKS, PLEASE GIVE US  A CALL AT [phone].

## 2024-02-29 ENCOUNTER — Other Ambulatory Visit: Payer: Self-pay

## 2024-02-29 MED ORDER — LOSARTAN POTASSIUM 100 MG PO TABS: 100.0000 mg | ORAL_TABLET | Freq: Every day | ORAL | 0 refills | Status: AC

## 2024-03-06 NOTE — Assessment & Plan Note (Addendum)
 A1c previously reached 6.5. Ozempic prescribed for diabetes management and weight loss. No nausea or constipation reported. Discussed how Ozempic reduces appetite and may lower cardiac death and stroke risk. - Send prescription for Ozempic. - Provide sample of Ozempic until prescription is filled. - Advise stool softener if constipation occurs. - Encourage regular bowel movements, use prunes if needed. - Monitor A1c every 3-4 months. - Encourage protein intake and light exercise.  Eye exam overdue. Discussed importance of regular eye exams and foot checks to prevent complications. - Schedule mammogram. - Schedule eye exam with Dr. Jens' sons if Dr. Jens unavailable. - Advise regular foot checks, avoid walking barefoot. Diabetic foot exam done

## 2024-03-11 ENCOUNTER — Other Ambulatory Visit: Payer: Self-pay | Admitting: Nurse Practitioner

## 2024-03-11 DIAGNOSIS — I272 Pulmonary hypertension, unspecified: Secondary | ICD-10-CM

## 2024-03-16 ENCOUNTER — Other Ambulatory Visit: Payer: Self-pay | Admitting: Nurse Practitioner

## 2024-03-16 DIAGNOSIS — I272 Pulmonary hypertension, unspecified: Secondary | ICD-10-CM

## 2024-03-18 ENCOUNTER — Other Ambulatory Visit: Payer: Self-pay

## 2024-03-18 ENCOUNTER — Encounter (INDEPENDENT_AMBULATORY_CARE_PROVIDER_SITE_OTHER): Payer: Self-pay

## 2024-03-18 ENCOUNTER — Other Ambulatory Visit: Payer: Self-pay | Admitting: Nurse Practitioner

## 2024-03-18 ENCOUNTER — Ambulatory Visit (INDEPENDENT_AMBULATORY_CARE_PROVIDER_SITE_OTHER)

## 2024-03-18 VITALS — BP 135/87 | HR 67 | Ht 63.5 in | Wt 240.0 lb

## 2024-03-18 DIAGNOSIS — R42 Dizziness and giddiness: Secondary | ICD-10-CM | POA: Diagnosis not present

## 2024-03-18 MED ORDER — MECLIZINE HCL 12.5 MG PO TABS: 12.5000 mg | ORAL_TABLET | Freq: Three times a day (TID) | ORAL | 2 refills | Status: DC | PRN
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
## 2024-03-21 ENCOUNTER — Encounter (INDEPENDENT_AMBULATORY_CARE_PROVIDER_SITE_OTHER): Payer: Self-pay

## 2024-03-26 ENCOUNTER — Ambulatory Visit: Admitting: Allergy and Immunology

## 2024-04-08 ENCOUNTER — Institutional Professional Consult (permissible substitution) (INDEPENDENT_AMBULATORY_CARE_PROVIDER_SITE_OTHER): Admitting: Otolaryngology

## 2024-04-23 ENCOUNTER — Other Ambulatory Visit: Payer: Self-pay

## 2024-04-23 ENCOUNTER — Ambulatory Visit: Admitting: Allergy and Immunology

## 2024-04-23 ENCOUNTER — Encounter: Payer: Self-pay | Admitting: Allergy and Immunology

## 2024-04-23 VITALS — BP 148/70 | HR 78 | Temp 98.0°F | Resp 16 | Wt 246.8 lb

## 2024-04-23 DIAGNOSIS — R400 Somnolence: Secondary | ICD-10-CM

## 2024-04-23 DIAGNOSIS — J3089 Other allergic rhinitis: Secondary | ICD-10-CM

## 2024-04-23 DIAGNOSIS — J4520 Mild intermittent asthma, uncomplicated: Secondary | ICD-10-CM | POA: Diagnosis not present

## 2024-04-23 DIAGNOSIS — R42 Dizziness and giddiness: Secondary | ICD-10-CM | POA: Diagnosis not present

## 2024-04-23 MED ORDER — MECLIZINE HCL 12.5 MG PO TABS: 12.5000 mg | ORAL_TABLET | Freq: Three times a day (TID) | ORAL | 2 refills | Status: AC | PRN

## 2024-04-23 MED ORDER — CETIRIZINE HCL 10 MG PO TABS: 10.0000 mg | ORAL_TABLET | Freq: Every day | ORAL | 2 refills | Status: AC | PRN

## 2024-04-23 MED ORDER — ALBUTEROL SULFATE HFA 108 (90 BASE) MCG/ACT IN AERS: 2.0000 | INHALATION_SPRAY | Freq: Four times a day (QID) | RESPIRATORY_TRACT | 2 refills | Status: AC | PRN

## 2024-04-23 MED ORDER — FLUTICASONE PROPIONATE 50 MCG/ACT NA SUSP: 2.0000 | Freq: Every day | NASAL | 5 refills | Status: AC

## 2024-04-23 MED ORDER — FLUTICASONE-SALMETEROL 250-50 MCG/ACT IN AEPB: 1.0000 | INHALATION_SPRAY | Freq: Two times a day (BID) | RESPIRATORY_TRACT | 5 refills | Status: AC

## 2024-04-23 NOTE — Patient Instructions (Addendum)
" °  1.  Treat and prevent inflammation of airway:   A. Wixela 250 - 1 inhalation 1-2 times per day B. Flonase  - 2 sprays each nostril 3-7 times per week  2.  If needed:   A. Cetirizine  10 mg - 1 tablet 1 time per day - does this make you drowsy?  B. Meclizine  12.5 - 1 tablet 1-3 times per day - does this make you drowsy?  3. If needed:   A.  Albuterol  HFA - 2 inhalations every 4-6 hours  4.  Use visual cues to help with dizziness issue  5. Influenza = Tamiflu. Covid = Paxlovid  6. Return to clinic in 6 months or earlier if problem  7. Refer to Neurology for vertigo. Obtain MRI Brain w/wo contrast "
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
## 2024-04-29 ENCOUNTER — Encounter: Payer: Self-pay | Admitting: Allergy and Immunology

## 2024-04-30 ENCOUNTER — Telehealth: Payer: Self-pay | Admitting: *Deleted

## 2024-04-30 NOTE — Addendum Note (Signed)
 Addended by: FERNANDEZ-VERNON, Italia Wolfert R on: 04/30/2024 05:58 PM   Modules accepted: Orders

## 2024-04-30 NOTE — Telephone Encounter (Signed)
 MRI of the brain with and without contrast has been ordered. Can we please refer the patient to neurology for vertigo?

## 2024-04-30 NOTE — Telephone Encounter (Signed)
-----   Message from Camellia Denis, MD sent at 04/29/2024  6:29 AM EST ----- 1.. Refer to Neurology for vertigo.   2. Obtain MRI Brain w/wo contrast

## 2024-05-01 NOTE — Telephone Encounter (Signed)
 Called patient's insurance and provided information for Brain MRI with and without contrast. Insurance stated that a Prior Authorization was not needed. I called the patient and informed of the MRI and the referral to Neurology. Patient verbalized understanding and stated that she is going to call Centralized Scheduling to schedule the MRI.

## 2024-05-08 ENCOUNTER — Ambulatory Visit: Payer: Self-pay | Admitting: Nurse Practitioner

## 2024-05-08 ENCOUNTER — Encounter: Payer: Self-pay | Admitting: Nurse Practitioner

## 2024-05-08 ENCOUNTER — Ambulatory Visit: Payer: Medicare Other

## 2024-05-08 VITALS — BP 136/70 | HR 76 | Temp 97.6°F | Ht 63.5 in | Wt 246.6 lb

## 2024-05-08 VITALS — BP 136/70 | HR 76 | Temp 97.6°F | Ht 63.0 in | Wt 246.0 lb

## 2024-05-08 DIAGNOSIS — E66813 Obesity, class 3: Secondary | ICD-10-CM | POA: Diagnosis not present

## 2024-05-08 DIAGNOSIS — I272 Pulmonary hypertension, unspecified: Secondary | ICD-10-CM | POA: Diagnosis not present

## 2024-05-08 DIAGNOSIS — I700 Atherosclerosis of aorta: Secondary | ICD-10-CM | POA: Diagnosis not present

## 2024-05-08 DIAGNOSIS — H401131 Primary open-angle glaucoma, bilateral, mild stage: Secondary | ICD-10-CM

## 2024-05-08 DIAGNOSIS — Z6841 Body Mass Index (BMI) 40.0 and over, adult: Secondary | ICD-10-CM

## 2024-05-08 DIAGNOSIS — Z0000 Encounter for general adult medical examination without abnormal findings: Secondary | ICD-10-CM

## 2024-05-08 DIAGNOSIS — E1169 Type 2 diabetes mellitus with other specified complication: Secondary | ICD-10-CM

## 2024-05-08 DIAGNOSIS — R29898 Other symptoms and signs involving the musculoskeletal system: Secondary | ICD-10-CM | POA: Diagnosis not present

## 2024-05-08 DIAGNOSIS — M25512 Pain in left shoulder: Secondary | ICD-10-CM

## 2024-05-08 DIAGNOSIS — E782 Mixed hyperlipidemia: Secondary | ICD-10-CM | POA: Diagnosis not present

## 2024-05-08 DIAGNOSIS — E669 Obesity, unspecified: Secondary | ICD-10-CM

## 2024-05-08 MED ORDER — DORZOLAMIDE HCL-TIMOLOL MAL 2-0.5 % OP SOLN: 1.0000 [drp] | Freq: Two times a day (BID) | OPHTHALMIC | 3 refills | Status: AC
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
## 2024-05-08 NOTE — Progress Notes (Signed)
 LILLETTE Kristeen JINNY Gladis, CMA,acting as a neurosurgeon for Gaines Ada, FNP.,have documented all relevant documentation on the behalf of Gaines Ada, FNP,as directed by  Gaines Ada, FNP while in the presence of Gaines Ada, FNP.  Subjective:  Patient ID: Rhonda [name] , female    DOB: 07-15-1950 , 74 y.o.   MRN: [id number]  Chief Complaint  Patient presents with   Hypertension    Patient presents today for a bp and dm follow up, Patient reports compliance with medication. Patient denies any chest pain, SOB, or headaches. Patient has no concerns today.      Diabetes She presents for her follow-up diabetic visit. She has type 2 diabetes mellitus. Her disease course has been stable. Pertinent negatives for diabetes include no blurred vision. There are no hypoglycemic complications. Current diabetic treatment includes oral agent (monotherapy). When asked about meal planning, she reported none. She has not had a previous visit with a dietitian. She rarely participates in exercise. (Blood sugar highest is 95)    Discussed the use of AI scribe software for clinical note transcription with the patient, who gave verbal consent to proceed.  History of Present Illness Rhonda [name] is a 74 year old female who presents with shoulder pain and weakness.  She experiences pain localized to one shoulder, accompanied by weakness, without any known injury. She uses Tylenol  for pain relief before bed if the pain is bad. She recalls a fall in August on the opposite side, resulting in a scar.  She is currently on Ozempic  0.5 mg and denies nausea but reports constipation, for which she uses a stool softener. She monitors her blood sugar levels, which have been elevated since 95. She follows a diet of eating twice a day, and her weight has remained stable since December, with a slight increase.  She has a history of eye pressure behind her left eye and was previously prescribed eye drops. She has an upcoming  appointment in June and is concerned about running out of her current eye drops before then.  She is due for a mammogram and is uncertain of the exact date of her last one, which she believes was either last year or the year before at a GYN office.  Past Medical History:  Diagnosis Date   Acute bacterial sinusitis 06/12/2015   Anemia    hx none since menopause   Arthritis    maybe in my legs (06/15/2016)   Asthma 2018   none since   Complication of anesthesia    woke up during colonoscopy 03-31-16 Dr. Timmothy   GERD (gastroesophageal reflux disease)    History of hiatal hernia    Hyperlipemia    Hypertension    Hypokalemia due to excessive gastrointestinal loss of potassium 12/02/2019   Pre-diabetes 2019   Thyroid  nodule 2010   no problems w/them since; on a pill for a while (06/15/2016)     Family History  Problem Relation Age of Onset   COPD Father    Breast cancer Neg Hx     Current Medications[1]   Allergies[2]   Review of Systems  Constitutional: Negative.   HENT:  Negative for ear pain.   Eyes:  Negative for blurred vision.  Respiratory:  Negative for cough.   Cardiovascular: Negative.   Neurological: Negative.   Psychiatric/Behavioral: Negative.    All other systems reviewed and are negative.    Today's Vitals   05/08/24 1112  BP: 136/70  Pulse: 76  Temp: 97.6 F (36.4  C)  TempSrc: Oral  Weight: 246 lb (111.6 kg)  Height: 5' 3 (1.6 m)  PainSc: 0-No pain   Body mass index is 43.58 kg/m.  Wt Readings from Last 3 Encounters:  05/08/24 246 lb (111.6 kg)  05/08/24 246 lb 9.6 oz (111.9 kg)  04/23/24 246 lb 12.8 oz (111.9 kg)    The ASCVD Risk score (Arnett DK, et al., 2019) failed to calculate for the following reasons:   Risk score cannot be calculated because patient has a medical history suggesting prior/existing ASCVD   * - Cholesterol units were assumed  Objective:  Physical Exam Vitals and nursing note reviewed.  Constitutional:       General: She is not in acute distress.    Appearance: Normal appearance. She is obese.  Cardiovascular:     Rate and Rhythm: Normal rate and regular rhythm.     Pulses: Normal pulses.     Heart sounds: Normal heart sounds. No murmur heard. Pulmonary:     Effort: Pulmonary effort is normal. No respiratory distress.     Breath sounds: Normal breath sounds. No wheezing.  Musculoskeletal:        General: Tenderness (right shoulder) present. No swelling.     Right shoulder: No crepitus. Decreased range of motion.     Left shoulder: No crepitus. Normal range of motion.     Comments: Shoulder strength is 4/5 right and 5/5 left  Neurological:     General: No focal deficit present.     Mental Status: She is alert and oriented to person, place, and time.     Cranial Nerves: No cranial nerve deficit.     Motor: No weakness.  Psychiatric:        Mood and Affect: Mood normal.        Behavior: Behavior normal.        Thought Content: Thought content normal.        Judgment: Judgment normal.         Assessment And Plan:   Assessment & Plan Pulmonary hypertension (HCC) Blood pressure is increased slightly, continue current medications.  Mixed hyperlipidemia Cholesterol levels are stable, continue current medications Type 2 diabetes mellitus in patient with obesity (HCC) Type 2 diabetes on Semaglutide  0.5 mg weekly. A1c at 6.5%. - Checked A1c today. - Consider increasing Semaglutide  to 1 mg based on A1c results. Atherosclerosis of aorta Continue statin, tolerating well , Class 3 severe obesity due to excess calories with serious comorbidity and body mass index (BMI) of 40.0 to 44.9 in adult [facility]) She is encouraged to strive for BMI less than 30 to decrease cardiac risk. Advised to aim for at least 150 minutes of exercise per week.  Primary open-angle glaucoma, bilateral, mild stage Transitioning care to new ophthalmologist. - Sent prescription for dorzolamide -timolol  eye drops. Acute  pain of left shoulder Left shoulder pain and weakness, likely arthritis-related. No recent injury, but fall in August. - Ordered x-ray of left shoulder. - Consider Tylenol  or pain cream for pain management. - Consider referral to physical therapy for shoulder strengthening. Weakness of left shoulder   Orders Placed This Encounter  Procedures   DG Shoulder Left   Hemoglobin A1c   Lipid panel   BMP8+eGFR   Amb Referral To Provider Referral Exercise Program (P.R.E.P)      Return for controlled DM check 4 months.  Patient was given opportunity to ask questions. Patient verbalized understanding of the plan and was able to repeat key elements of the plan.  All questions were answered to their satisfaction.     LILLETTE Gaines Ada, FNP, have reviewed all documentation for this visit. The documentation on 05/08/24 for the exam, diagnosis, procedures, and orders are all accurate and complete.  IF YOU HAVE BEEN REFERRED TO A SPECIALIST, IT MAY TAKE 1-2 WEEKS TO SCHEDULE/PROCESS THE REFERRAL. IF YOU HAVE NOT HEARD FROM US /SPECIALIST IN TWO WEEKS, PLEASE GIVE US  A CALL AT [phone].      [1]  Current Outpatient Medications:    acetaminophen  (TYLENOL ) 650 MG CR tablet, Take 650 mg by mouth at bedtime as needed for pain., Disp: , Rfl:    albuterol  (VENTOLIN  HFA) 108 (90 Base) MCG/ACT inhaler, Inhale 2 puffs into the lungs every 6 (six) hours as needed for wheezing or shortness of breath., Disp: 18 g, Rfl: 2   Albuterol -Budesonide (AIRSUPRA ) 90-80 MCG/ACT AERO, Inhale 2 puffs into the lungs every 6 (six) hours as needed., Disp: 32.1 g, Rfl: 2   Blood Glucose Monitoring Suppl DEVI, 1 each by Does not apply route in the morning, at noon, and at bedtime. May substitute to any manufacturer covered by patient's insurance., Disp: 1 each, Rfl: 0   Blood Pressure Monitoring (BLOOD PRESSURE CUFF) MISC, 1 each by Does not apply route daily., Disp: 1 each, Rfl: 0   Blood Pressure Monitoring (OMRON 3  SERIES BP MONITOR) DEVI, Use to check blood pressure daily., Disp: 1 each, Rfl: 0   cetirizine  (ZYRTEC ) 10 MG tablet, Take 1 tablet (10 mg total) by mouth daily as needed for allergies., Disp: 30 tablet, Rfl: 2   Cholecalciferol (VITAMIN D3 PO), Take 1 tablet by mouth daily., Disp: , Rfl:    fluticasone  (FLONASE ) 50 MCG/ACT nasal spray, Place 2 sprays into both nostrils daily., Disp: 16 g, Rfl: 5   fluticasone -salmeterol (WIXELA INHUB) 250-50 MCG/ACT AEPB, Inhale 1 puff into the lungs in the morning and at bedtime., Disp: 60 each, Rfl: 5   furosemide  (LASIX ) 20 MG tablet, Take 20 mg by mouth daily as needed., Disp: , Rfl:    losartan  (COZAAR ) 100 MG tablet, Take 1 tablet (100 mg total) by mouth daily., Disp: 90 tablet, Rfl: 0   Magnesium  250 MG TABS, Take 1 tablet (250 mg total) by mouth daily., Disp: 30 tablet, Rfl: 0   meclizine  (ANTIVERT ) 12.5 MG tablet, Take 1 tablet (12.5 mg total) by mouth 3 (three) times daily as needed for dizziness., Disp: 30 tablet, Rfl: 2   metoprolol  succinate (TOPROL -XL) 25 MG 24 hr tablet, Take 1 tablet by mouth once daily, Disp: 90 tablet, Rfl: 3   montelukast  (SINGULAIR ) 10 MG tablet, Take 1 tablet (10 mg total) by mouth at bedtime., Disp: 30 tablet, Rfl: 5   Multiple Vitamin (MULTIVITAMIN WITH MINERALS) TABS tablet, Take 1 tablet by mouth daily., Disp: , Rfl:    rosuvastatin  (CRESTOR ) 40 MG tablet, Take 1 tablet (40 mg total) by mouth daily. Please call [phone] to schedule an appointment for future refills. Thank you., Disp: 30 tablet, Rfl: 0   Semaglutide ,0.25 or 0.5MG /DOS, (OZEMPIC , 0.25 OR 0.5 MG/DOSE,) 2 MG/3ML SOPN, Inject 0.5 mg into the skin once a week., Disp: 9 mL, Rfl: 1   dorzolamide -timolol  (COSOPT ) 2-0.5 % ophthalmic solution, Place 1 drop into both eyes 2 (two) times daily., Disp: 10 mL, Rfl: 3   ondansetron  (ZOFRAN ) 4 MG tablet, Take 1 tablet (4 mg total) by mouth every 8 (eight) hours as needed for nausea or vomiting. (Patient not taking: Reported  on 05/08/2024), Disp:  20 tablet, Rfl: 0   pantoprazole  (PROTONIX ) 40 MG tablet, Take 1 tablet (40 mg total) by mouth 2 (two) times daily. (Patient not taking: Reported on 05/08/2024), Disp: 60 tablet, Rfl: 0   potassium chloride  (KLOR-CON ) 10 MEQ tablet, Take 1 tablet (10 mEq total) by mouth daily. Take with furosemide  (Patient not taking: Reported on 05/08/2024), Disp: 90 tablet, Rfl: 2 [2]  Allergies Allergen Reactions   Oxycontin  [Oxycodone  Hcl] Other (See Comments)    Hallucination    Aspirin -Acetaminophen -Caffeine Nausea And Vomiting   Codeine Diarrhea and Nausea And Vomiting    Stomach cramps

## 2024-05-08 NOTE — Patient Instructions (Signed)
 Rhonda [name],  Thank you for taking the time for your Medicare Wellness Visit. I appreciate your continued commitment to your health goals. Please review the care plan we discussed, and feel free to reach out if I can assist you further.  Please note that Annual Wellness Visits do not include a physical exam. Some assessments may be limited, especially if the visit was conducted virtually. If needed, we may recommend an in-person follow-up with your provider.  Ongoing Care Seeing your primary care provider every 3 to 6 months helps us  monitor your health and provide consistent, personalized care.   Referrals If a referral was made during today's visit and you haven't received any updates within two weeks, please contact the referred provider directly to check on the status.  Recommended Screenings:  Health Maintenance  Topic Date Due   Eye exam for diabetics  04/19/2023   Breast Cancer Screening  02/11/2024   Yearly kidney health urinalysis for diabetes  04/18/2024   Medicare Annual Wellness Visit  04/18/2024   Pneumococcal Vaccine for age over 62 (2 of 2 - PCV) 06/07/2024*   Hemoglobin A1C  07/07/2024   COVID-19 Vaccine (7 - Pfizer risk 2025-26 season) 08/10/2024   Yearly kidney function blood test for diabetes  01/01/2025   Complete foot exam   02/27/2025   Colon Cancer Screening  02/17/2027   Flu Shot  Completed   Osteoporosis screening with Bone Density Scan  Completed   Hepatitis C Screening  Completed   Zoster (Shingles) Vaccine  Completed   Meningitis B Vaccine  Aged Out   DTaP/Tdap/Td vaccine  Discontinued  *Topic was postponed. The date shown is not the original due date.       05/08/2024   10:51 AM  Advanced Directives  Does Patient Have a Medical Advance Directive? No  Would patient like information on creating a medical advance directive? No - Patient declined    Vision: Annual vision screenings are recommended for early detection of glaucoma, cataracts, and  diabetic retinopathy. These exams can also reveal signs of chronic conditions such as diabetes and high blood pressure.  Dental: Annual dental screenings help detect early signs of oral cancer, gum disease, and other conditions linked to overall health, including heart disease and diabetes.  Please see the attached documents for additional preventive care recommendations.

## 2024-05-09 ENCOUNTER — Telehealth: Payer: Self-pay

## 2024-05-09 ENCOUNTER — Ambulatory Visit
Admission: RE | Admit: 2024-05-09 | Discharge: 2024-05-09 | Disposition: A | Discharge status: Home / Self Care | Source: Ambulatory Visit | Attending: Nurse Practitioner | Admitting: Nurse Practitioner

## 2024-05-09 DIAGNOSIS — R29898 Other symptoms and signs involving the musculoskeletal system: Secondary | ICD-10-CM

## 2024-05-09 DIAGNOSIS — M25512 Pain in left shoulder: Secondary | ICD-10-CM

## 2024-05-09 NOTE — Telephone Encounter (Signed)
 Returned my call, she would like to participate in next PREP class at [facility]; will ask [facility] contact her with dates/times for next class, most likely end of Jan.

## 2024-05-09 NOTE — Telephone Encounter (Signed)
Called to discuss PREP program referral, left voicemail requesting return call.

## 2024-05-13 ENCOUNTER — Ambulatory Visit: Payer: Self-pay | Admitting: Nurse Practitioner

## 2024-05-13 ENCOUNTER — Telehealth: Payer: Self-pay

## 2024-05-13 NOTE — Telephone Encounter (Signed)
 Left message about upcoming PREP Class at [facility] starting Feb 2 at 1:00 and asked her to return my call.

## 2024-05-14 ENCOUNTER — Telehealth: Payer: Self-pay

## 2024-05-14 NOTE — Telephone Encounter (Signed)
 Rhonda [name] left a message saying she was interested in joining the [facility] class starting Feb 2. I returned her call and scheduled her for her initial assessment.

## 2024-05-15 ENCOUNTER — Ambulatory Visit: Payer: Self-pay | Admitting: Allergy and Immunology

## 2024-05-15 ENCOUNTER — Ambulatory Visit (HOSPITAL_COMMUNITY)
Admission: RE | Admit: 2024-05-15 | Discharge: 2024-05-15 | Disposition: A | Discharge status: Home / Self Care | Source: Ambulatory Visit | Attending: Allergy and Immunology | Admitting: Allergy and Immunology

## 2024-05-15 DIAGNOSIS — R42 Dizziness and giddiness: Secondary | ICD-10-CM | POA: Insufficient documentation

## 2024-05-15 MED ORDER — GADOBUTROL 1 MMOL/ML IV SOLN
10.0000 mL | Freq: Once | INTRAVENOUS | Status: AC | PRN
  Administered 2024-05-15: 10 mL via INTRAVENOUS

## 2024-05-17 NOTE — Telephone Encounter (Signed)
 Rhonda [name] has an appointment with [facility] for 09/19/24 at 1:30 pm with Dr. Margaret.

## 2024-05-19 NOTE — Assessment & Plan Note (Addendum)
 Continue statin, tolerating well ,

## 2024-05-19 NOTE — Assessment & Plan Note (Addendum)
 Cholesterol levels are stable, continue current medications.

## 2024-05-19 NOTE — Assessment & Plan Note (Signed)
 Transitioning care to new ophthalmologist. - Sent prescription for dorzolamide -timolol  eye drops.

## 2024-05-19 NOTE — Assessment & Plan Note (Addendum)
 She is encouraged to strive for BMI less than 30 to decrease cardiac risk. Advised to aim for at least 150 minutes of exercise per week.

## 2024-05-19 NOTE — Assessment & Plan Note (Addendum)
 Blood pressure is increased slightly, continue current medications.

## 2024-05-19 NOTE — Assessment & Plan Note (Signed)
 Type 2 diabetes on Semaglutide  0.5 mg weekly. A1c at 6.5%. - Checked A1c today. - Consider increasing Semaglutide  to 1 mg based on A1c results.

## 2024-05-24 ENCOUNTER — Other Ambulatory Visit: Payer: Self-pay | Admitting: Family Medicine

## 2024-05-30 ENCOUNTER — Telehealth: Payer: Self-pay
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
## 2024-05-30 NOTE — Telephone Encounter (Signed)
 PA for ozempic sent to plan.

## 2024-06-03 ENCOUNTER — Ambulatory Visit: Payer: Self-pay

## 2024-06-03 NOTE — Telephone Encounter (Signed)
 FYI Only or Action Required?: Action required by provider: clinical question for provider.  Patient was last seen in primary care on 05/08/2024 by Georgina Speaks, FNP.  Called Nurse Triage reporting Results.  Symptoms began NA.  Interventions attempted: Other: NA.  Symptoms are: stable.  Triage Disposition: Call PCP Within 24 Hours  Patient/caregiver understands and will follow disposition?: Yes  Reason for Disposition  Caller requesting lab results  (Exception: Routine or non-urgent lab result.)  Answer Assessment - Initial Assessment Questions 1. REASON FOR CALL or QUESTION: What is your reason for calling today? or How can I best     Requesting MRI results, provided patient with results as well as note from Dr. Maurilio stating, Please let Shavonte know that her MRI fortunately did not show anything bad and we will wait to see what neurology says about her vertigo.  Patient does have questions about white matter disease and if it is something that can be treated. RN answered these questions as best as possible.   2. CALLER: Document the source of call. (e.g., laboratory staff, caregiver or patient).     Patient  Protocols used: PCP Call - No Triage-A-AH  Copied from CRM [id number]. Topic: Clinical - Lab/Test Results >> Jun 03, 2024  1:15 PM China J wrote: Reason for CRM: Patient calling for MRI results from the 14th.

## 2024-06-07 ENCOUNTER — Telehealth: Payer: Self-pay

## 2024-06-07 NOTE — Telephone Encounter (Signed)
 Ozempic  approved until 05/30/2025.

## 2024-09-05 ENCOUNTER — Ambulatory Visit: Payer: Self-pay | Admitting: Nurse Practitioner

## 2024-09-19 ENCOUNTER — Ambulatory Visit: Admitting: Diagnostic Neuroimaging

## 2024-10-22 ENCOUNTER — Ambulatory Visit: Admitting: Allergy and Immunology

## 2025-05-14 ENCOUNTER — Ambulatory Visit: Payer: Self-pay
